# Patient Record
Sex: Female | Born: 1950
Health system: Southern US, Community
[De-identification: ages and names within clinical notes are randomized; demographics above are authoritative.]

## PROBLEM LIST (undated history)

## (undated) DIAGNOSIS — D649 Anemia, unspecified: Secondary | ICD-10-CM

## (undated) DIAGNOSIS — E119 Type 2 diabetes mellitus without complications: Secondary | ICD-10-CM

## (undated) DIAGNOSIS — C801 Malignant (primary) neoplasm, unspecified: Secondary | ICD-10-CM

## (undated) DIAGNOSIS — Z9289 Personal history of other medical treatment: Secondary | ICD-10-CM

## (undated) DIAGNOSIS — R011 Cardiac murmur, unspecified: Secondary | ICD-10-CM

## (undated) DIAGNOSIS — K8020 Calculus of gallbladder without cholecystitis without obstruction: Secondary | ICD-10-CM

## (undated) DIAGNOSIS — M109 Gout, unspecified: Secondary | ICD-10-CM

## (undated) DIAGNOSIS — I499 Cardiac arrhythmia, unspecified: Secondary | ICD-10-CM

## (undated) DIAGNOSIS — K56609 Unspecified intestinal obstruction, unspecified as to partial versus complete obstruction: Secondary | ICD-10-CM

## (undated) DIAGNOSIS — I10 Essential (primary) hypertension: Secondary | ICD-10-CM

## (undated) DIAGNOSIS — N2889 Other specified disorders of kidney and ureter: Secondary | ICD-10-CM

## (undated) DIAGNOSIS — G5600 Carpal tunnel syndrome, unspecified upper limb: Secondary | ICD-10-CM

## (undated) DIAGNOSIS — N183 Chronic kidney disease, stage 3 unspecified: Secondary | ICD-10-CM

## (undated) DIAGNOSIS — Z8639 Personal history of other endocrine, nutritional and metabolic disease: Secondary | ICD-10-CM

## (undated) DIAGNOSIS — J690 Pneumonitis due to inhalation of food and vomit: Secondary | ICD-10-CM

## (undated) DIAGNOSIS — C541 Malignant neoplasm of endometrium: Secondary | ICD-10-CM

## (undated) DIAGNOSIS — K5792 Diverticulitis of intestine, part unspecified, without perforation or abscess without bleeding: Secondary | ICD-10-CM

## (undated) DIAGNOSIS — Z860100 Personal history of colon polyps, unspecified: Secondary | ICD-10-CM

## (undated) DIAGNOSIS — E785 Hyperlipidemia, unspecified: Secondary | ICD-10-CM

## (undated) DIAGNOSIS — C4490 Unspecified malignant neoplasm of skin, unspecified: Secondary | ICD-10-CM

## (undated) DIAGNOSIS — I4891 Unspecified atrial fibrillation: Secondary | ICD-10-CM

## (undated) DIAGNOSIS — R600 Localized edema: Secondary | ICD-10-CM

## (undated) DIAGNOSIS — N289 Disorder of kidney and ureter, unspecified: Secondary | ICD-10-CM

## (undated) DIAGNOSIS — M159 Polyosteoarthritis, unspecified: Secondary | ICD-10-CM

## (undated) DIAGNOSIS — H269 Unspecified cataract: Secondary | ICD-10-CM

## (undated) DIAGNOSIS — R32 Unspecified urinary incontinence: Secondary | ICD-10-CM

## (undated) DIAGNOSIS — I509 Heart failure, unspecified: Secondary | ICD-10-CM

## (undated) DIAGNOSIS — K219 Gastro-esophageal reflux disease without esophagitis: Secondary | ICD-10-CM

## (undated) DIAGNOSIS — K5790 Diverticulosis of intestine, part unspecified, without perforation or abscess without bleeding: Secondary | ICD-10-CM

## (undated) DIAGNOSIS — Z87440 Personal history of urinary (tract) infections: Secondary | ICD-10-CM

## (undated) DIAGNOSIS — T7840XA Allergy, unspecified, initial encounter: Secondary | ICD-10-CM

## (undated) DIAGNOSIS — R609 Edema, unspecified: Secondary | ICD-10-CM

## (undated) DIAGNOSIS — M1990 Unspecified osteoarthritis, unspecified site: Secondary | ICD-10-CM

## (undated) HISTORY — DX: Unspecified intestinal obstruction, unspecified as to partial versus complete obstruction: K56.609

## (undated) HISTORY — DX: Polyosteoarthritis, unspecified: M15.9

## (undated) HISTORY — DX: Pneumonitis due to inhalation of food and vomit: J69.0

## (undated) HISTORY — DX: Calculus of gallbladder without cholecystitis without obstruction: K80.20

## (undated) HISTORY — PX: COLOSTOMY REVERSAL: SHX5782

## (undated) HISTORY — PX: ABDOMINAL SURGERY: SHX537

## (undated) HISTORY — DX: Heart failure, unspecified: I50.9

## (undated) HISTORY — DX: Cardiac murmur, unspecified: R01.1

## (undated) HISTORY — DX: Unspecified urinary incontinence: R32

## (undated) HISTORY — PX: COLOSTOMY: SHX63

## (undated) HISTORY — PX: PILONIDAL CYST EXCISION: SHX744

## (undated) HISTORY — PX: EYE SURGERY: SHX253

## (undated) HISTORY — DX: Other specified disorders of kidney and ureter: N28.89

## (undated) HISTORY — PX: COLONOSCOPY: SHX174

## (undated) HISTORY — DX: Localized edema: R60.0

## (undated) HISTORY — DX: Diverticulosis of intestine, part unspecified, without perforation or abscess without bleeding: K57.90

## (undated) HISTORY — PX: CARPAL TUNNEL RELEASE: SHX101

## (undated) HISTORY — DX: Personal history of colonic polyps: Z86.010

## (undated) HISTORY — DX: Edema, unspecified: R60.9

## (undated) HISTORY — DX: Unspecified atrial fibrillation: I48.91

## (undated) HISTORY — DX: Chronic kidney disease, stage 3 unspecified: N18.30

## (undated) HISTORY — PX: SKIN CANCER EXCISION: SHX779

## (undated) HISTORY — DX: Anemia, unspecified: D64.9

## (undated) HISTORY — DX: Personal history of colon polyps, unspecified: Z86.0100

## (undated) HISTORY — PX: INJECTION KNEE: SHX2446

## (undated) HISTORY — DX: Personal history of other medical treatment: Z92.89

## (undated) HISTORY — PX: TRANSTHORACIC ECHOCARDIOGRAM: SHX275

## (undated) HISTORY — PX: COLON SURGERY: SHX602

## (undated) HISTORY — DX: Personal history of urinary (tract) infections: Z87.440

## (undated) HISTORY — DX: Cardiac arrhythmia, unspecified: I49.9

## (undated) HISTORY — DX: Unspecified cataract: H26.9

## (undated) HISTORY — DX: Disorder of kidney and ureter, unspecified: N28.9

## (undated) HISTORY — DX: Allergy, unspecified, initial encounter: T78.40XA

---

## 1989-12-31 HISTORY — PX: CARPAL TUNNEL RELEASE: SHX101

## 1998-12-31 HISTORY — PX: OOPHORECTOMY: SHX86

## 2014-03-09 DIAGNOSIS — Z124 Encounter for screening for malignant neoplasm of cervix: Secondary | ICD-10-CM | POA: Diagnosis not present

## 2014-03-10 DIAGNOSIS — Z124 Encounter for screening for malignant neoplasm of cervix: Secondary | ICD-10-CM | POA: Diagnosis not present

## 2014-04-23 DIAGNOSIS — I10 Essential (primary) hypertension: Secondary | ICD-10-CM | POA: Diagnosis not present

## 2014-04-23 DIAGNOSIS — E785 Hyperlipidemia, unspecified: Secondary | ICD-10-CM | POA: Diagnosis not present

## 2014-04-23 DIAGNOSIS — E119 Type 2 diabetes mellitus without complications: Secondary | ICD-10-CM | POA: Diagnosis not present

## 2014-04-23 DIAGNOSIS — E669 Obesity, unspecified: Secondary | ICD-10-CM | POA: Diagnosis not present

## 2014-04-23 DIAGNOSIS — Z6841 Body Mass Index (BMI) 40.0 and over, adult: Secondary | ICD-10-CM | POA: Diagnosis not present

## 2014-05-31 DIAGNOSIS — N959 Unspecified menopausal and perimenopausal disorder: Secondary | ICD-10-CM | POA: Diagnosis not present

## 2014-05-31 DIAGNOSIS — Z1231 Encounter for screening mammogram for malignant neoplasm of breast: Secondary | ICD-10-CM | POA: Diagnosis not present

## 2014-06-21 DIAGNOSIS — I10 Essential (primary) hypertension: Secondary | ICD-10-CM | POA: Diagnosis not present

## 2014-06-21 DIAGNOSIS — Z23 Encounter for immunization: Secondary | ICD-10-CM | POA: Diagnosis not present

## 2014-06-21 DIAGNOSIS — M25519 Pain in unspecified shoulder: Secondary | ICD-10-CM | POA: Diagnosis not present

## 2014-06-21 DIAGNOSIS — E785 Hyperlipidemia, unspecified: Secondary | ICD-10-CM | POA: Diagnosis not present

## 2014-06-21 DIAGNOSIS — M109 Gout, unspecified: Secondary | ICD-10-CM | POA: Diagnosis not present

## 2014-06-21 DIAGNOSIS — E119 Type 2 diabetes mellitus without complications: Secondary | ICD-10-CM | POA: Diagnosis not present

## 2014-06-21 DIAGNOSIS — M6530 Trigger finger, unspecified finger: Secondary | ICD-10-CM | POA: Diagnosis not present

## 2014-07-23 DIAGNOSIS — I10 Essential (primary) hypertension: Secondary | ICD-10-CM | POA: Diagnosis not present

## 2014-07-23 DIAGNOSIS — E785 Hyperlipidemia, unspecified: Secondary | ICD-10-CM | POA: Diagnosis not present

## 2014-07-23 DIAGNOSIS — Z6841 Body Mass Index (BMI) 40.0 and over, adult: Secondary | ICD-10-CM | POA: Diagnosis not present

## 2014-07-23 DIAGNOSIS — E119 Type 2 diabetes mellitus without complications: Secondary | ICD-10-CM | POA: Diagnosis not present

## 2014-07-23 DIAGNOSIS — E669 Obesity, unspecified: Secondary | ICD-10-CM | POA: Diagnosis not present

## 2014-08-11 DIAGNOSIS — L259 Unspecified contact dermatitis, unspecified cause: Secondary | ICD-10-CM | POA: Diagnosis not present

## 2014-08-11 DIAGNOSIS — L819 Disorder of pigmentation, unspecified: Secondary | ICD-10-CM | POA: Diagnosis not present

## 2014-08-11 DIAGNOSIS — D1801 Hemangioma of skin and subcutaneous tissue: Secondary | ICD-10-CM | POA: Diagnosis not present

## 2014-08-11 DIAGNOSIS — L821 Other seborrheic keratosis: Secondary | ICD-10-CM | POA: Diagnosis not present

## 2014-08-11 DIAGNOSIS — D239 Other benign neoplasm of skin, unspecified: Secondary | ICD-10-CM | POA: Diagnosis not present

## 2014-08-11 DIAGNOSIS — L905 Scar conditions and fibrosis of skin: Secondary | ICD-10-CM | POA: Diagnosis not present

## 2014-08-11 DIAGNOSIS — L908 Other atrophic disorders of skin: Secondary | ICD-10-CM | POA: Diagnosis not present

## 2014-08-11 DIAGNOSIS — L918 Other hypertrophic disorders of the skin: Secondary | ICD-10-CM | POA: Diagnosis not present

## 2014-10-20 DIAGNOSIS — M109 Gout, unspecified: Secondary | ICD-10-CM | POA: Diagnosis not present

## 2014-10-20 DIAGNOSIS — E119 Type 2 diabetes mellitus without complications: Secondary | ICD-10-CM | POA: Diagnosis not present

## 2014-10-20 DIAGNOSIS — Z23 Encounter for immunization: Secondary | ICD-10-CM | POA: Diagnosis not present

## 2014-10-20 DIAGNOSIS — I10 Essential (primary) hypertension: Secondary | ICD-10-CM | POA: Diagnosis not present

## 2014-10-20 DIAGNOSIS — M65331 Trigger finger, right middle finger: Secondary | ICD-10-CM | POA: Diagnosis not present

## 2014-10-20 DIAGNOSIS — E785 Hyperlipidemia, unspecified: Secondary | ICD-10-CM | POA: Diagnosis not present

## 2014-11-11 DIAGNOSIS — E119 Type 2 diabetes mellitus without complications: Secondary | ICD-10-CM | POA: Diagnosis not present

## 2014-12-02 DIAGNOSIS — N858 Other specified noninflammatory disorders of uterus: Secondary | ICD-10-CM | POA: Diagnosis not present

## 2014-12-02 DIAGNOSIS — N950 Postmenopausal bleeding: Secondary | ICD-10-CM | POA: Diagnosis not present

## 2015-01-12 DIAGNOSIS — N950 Postmenopausal bleeding: Secondary | ICD-10-CM | POA: Diagnosis not present

## 2015-01-13 DIAGNOSIS — N840 Polyp of corpus uteri: Secondary | ICD-10-CM | POA: Diagnosis not present

## 2015-01-13 DIAGNOSIS — N950 Postmenopausal bleeding: Secondary | ICD-10-CM | POA: Diagnosis not present

## 2015-01-25 DIAGNOSIS — Z09 Encounter for follow-up examination after completed treatment for conditions other than malignant neoplasm: Secondary | ICD-10-CM | POA: Diagnosis not present

## 2015-01-26 DIAGNOSIS — E119 Type 2 diabetes mellitus without complications: Secondary | ICD-10-CM | POA: Diagnosis not present

## 2015-04-27 DIAGNOSIS — E785 Hyperlipidemia, unspecified: Secondary | ICD-10-CM | POA: Diagnosis not present

## 2015-04-27 DIAGNOSIS — I10 Essential (primary) hypertension: Secondary | ICD-10-CM | POA: Diagnosis not present

## 2015-04-27 DIAGNOSIS — M25561 Pain in right knee: Secondary | ICD-10-CM | POA: Diagnosis not present

## 2015-04-27 DIAGNOSIS — E119 Type 2 diabetes mellitus without complications: Secondary | ICD-10-CM | POA: Diagnosis not present

## 2015-06-02 DIAGNOSIS — Z6841 Body Mass Index (BMI) 40.0 and over, adult: Secondary | ICD-10-CM | POA: Diagnosis not present

## 2015-06-02 DIAGNOSIS — Z1231 Encounter for screening mammogram for malignant neoplasm of breast: Secondary | ICD-10-CM | POA: Diagnosis not present

## 2015-06-02 DIAGNOSIS — Z01419 Encounter for gynecological examination (general) (routine) without abnormal findings: Secondary | ICD-10-CM | POA: Diagnosis not present

## 2015-06-02 DIAGNOSIS — R42 Dizziness and giddiness: Secondary | ICD-10-CM | POA: Diagnosis not present

## 2015-07-27 DIAGNOSIS — Z1211 Encounter for screening for malignant neoplasm of colon: Secondary | ICD-10-CM | POA: Diagnosis not present

## 2015-07-27 DIAGNOSIS — E119 Type 2 diabetes mellitus without complications: Secondary | ICD-10-CM | POA: Diagnosis not present

## 2015-07-27 DIAGNOSIS — Z794 Long term (current) use of insulin: Secondary | ICD-10-CM | POA: Diagnosis not present

## 2015-07-27 DIAGNOSIS — Z23 Encounter for immunization: Secondary | ICD-10-CM | POA: Diagnosis not present

## 2015-08-15 DIAGNOSIS — D225 Melanocytic nevi of trunk: Secondary | ICD-10-CM | POA: Diagnosis not present

## 2015-08-15 DIAGNOSIS — D2261 Melanocytic nevi of right upper limb, including shoulder: Secondary | ICD-10-CM | POA: Diagnosis not present

## 2015-08-15 DIAGNOSIS — L821 Other seborrheic keratosis: Secondary | ICD-10-CM | POA: Diagnosis not present

## 2015-08-15 DIAGNOSIS — D2272 Melanocytic nevi of left lower limb, including hip: Secondary | ICD-10-CM | POA: Diagnosis not present

## 2015-08-15 DIAGNOSIS — D1801 Hemangioma of skin and subcutaneous tissue: Secondary | ICD-10-CM | POA: Diagnosis not present

## 2015-08-15 DIAGNOSIS — D2271 Melanocytic nevi of right lower limb, including hip: Secondary | ICD-10-CM | POA: Diagnosis not present

## 2015-08-15 DIAGNOSIS — D2262 Melanocytic nevi of left upper limb, including shoulder: Secondary | ICD-10-CM | POA: Diagnosis not present

## 2015-08-15 DIAGNOSIS — D224 Melanocytic nevi of scalp and neck: Secondary | ICD-10-CM | POA: Diagnosis not present

## 2015-08-15 DIAGNOSIS — D2239 Melanocytic nevi of other parts of face: Secondary | ICD-10-CM | POA: Diagnosis not present

## 2015-09-09 DIAGNOSIS — Z1211 Encounter for screening for malignant neoplasm of colon: Secondary | ICD-10-CM | POA: Diagnosis not present

## 2015-09-09 DIAGNOSIS — R109 Unspecified abdominal pain: Secondary | ICD-10-CM | POA: Diagnosis not present

## 2015-09-09 DIAGNOSIS — D649 Anemia, unspecified: Secondary | ICD-10-CM | POA: Diagnosis not present

## 2015-10-28 DIAGNOSIS — K2970 Gastritis, unspecified, without bleeding: Secondary | ICD-10-CM | POA: Diagnosis not present

## 2015-10-28 DIAGNOSIS — R1084 Generalized abdominal pain: Secondary | ICD-10-CM | POA: Diagnosis not present

## 2015-10-28 DIAGNOSIS — K449 Diaphragmatic hernia without obstruction or gangrene: Secondary | ICD-10-CM | POA: Diagnosis not present

## 2015-10-28 DIAGNOSIS — Z1211 Encounter for screening for malignant neoplasm of colon: Secondary | ICD-10-CM | POA: Diagnosis not present

## 2015-10-28 DIAGNOSIS — K644 Residual hemorrhoidal skin tags: Secondary | ICD-10-CM | POA: Diagnosis not present

## 2015-10-28 DIAGNOSIS — D122 Benign neoplasm of ascending colon: Secondary | ICD-10-CM | POA: Diagnosis not present

## 2015-10-28 DIAGNOSIS — D125 Benign neoplasm of sigmoid colon: Secondary | ICD-10-CM | POA: Diagnosis not present

## 2015-10-28 DIAGNOSIS — D126 Benign neoplasm of colon, unspecified: Secondary | ICD-10-CM | POA: Diagnosis not present

## 2015-11-14 DIAGNOSIS — E785 Hyperlipidemia, unspecified: Secondary | ICD-10-CM | POA: Diagnosis not present

## 2015-11-14 DIAGNOSIS — I10 Essential (primary) hypertension: Secondary | ICD-10-CM | POA: Diagnosis not present

## 2015-11-14 DIAGNOSIS — Z23 Encounter for immunization: Secondary | ICD-10-CM | POA: Diagnosis not present

## 2015-11-14 DIAGNOSIS — N3941 Urge incontinence: Secondary | ICD-10-CM | POA: Diagnosis not present

## 2015-11-14 DIAGNOSIS — E119 Type 2 diabetes mellitus without complications: Secondary | ICD-10-CM | POA: Diagnosis not present

## 2015-11-14 DIAGNOSIS — M109 Gout, unspecified: Secondary | ICD-10-CM | POA: Diagnosis not present

## 2015-11-16 DIAGNOSIS — E119 Type 2 diabetes mellitus without complications: Secondary | ICD-10-CM | POA: Diagnosis not present

## 2015-11-16 DIAGNOSIS — H2513 Age-related nuclear cataract, bilateral: Secondary | ICD-10-CM | POA: Diagnosis not present

## 2015-12-20 DIAGNOSIS — R748 Abnormal levels of other serum enzymes: Secondary | ICD-10-CM | POA: Diagnosis not present

## 2015-12-20 DIAGNOSIS — Z794 Long term (current) use of insulin: Secondary | ICD-10-CM | POA: Diagnosis not present

## 2015-12-20 DIAGNOSIS — E119 Type 2 diabetes mellitus without complications: Secondary | ICD-10-CM | POA: Diagnosis not present

## 2016-01-13 DIAGNOSIS — R748 Abnormal levels of other serum enzymes: Secondary | ICD-10-CM | POA: Diagnosis not present

## 2016-06-04 DIAGNOSIS — Z1231 Encounter for screening mammogram for malignant neoplasm of breast: Secondary | ICD-10-CM | POA: Diagnosis not present

## 2016-06-04 DIAGNOSIS — Z6841 Body Mass Index (BMI) 40.0 and over, adult: Secondary | ICD-10-CM | POA: Diagnosis not present

## 2016-06-04 DIAGNOSIS — N958 Other specified menopausal and perimenopausal disorders: Secondary | ICD-10-CM | POA: Diagnosis not present

## 2016-06-04 DIAGNOSIS — Z124 Encounter for screening for malignant neoplasm of cervix: Secondary | ICD-10-CM | POA: Diagnosis not present

## 2016-06-07 ENCOUNTER — Inpatient Hospital Stay (HOSPITAL_COMMUNITY)
Admission: EM | Admit: 2016-06-07 | Discharge: 2016-06-08 | DRG: 388 | Disposition: A | Discharge status: Home / Self Care | Payer: Medicare Other | Attending: Internal Medicine | Admitting: Internal Medicine

## 2016-06-07 ENCOUNTER — Encounter (HOSPITAL_COMMUNITY): Payer: Self-pay | Admitting: *Deleted

## 2016-06-07 ENCOUNTER — Emergency Department (HOSPITAL_COMMUNITY): Payer: Medicare Other

## 2016-06-07 DIAGNOSIS — M109 Gout, unspecified: Secondary | ICD-10-CM | POA: Diagnosis present

## 2016-06-07 DIAGNOSIS — Z85828 Personal history of other malignant neoplasm of skin: Secondary | ICD-10-CM | POA: Diagnosis not present

## 2016-06-07 DIAGNOSIS — Z6841 Body Mass Index (BMI) 40.0 and over, adult: Secondary | ICD-10-CM | POA: Diagnosis not present

## 2016-06-07 DIAGNOSIS — Z79899 Other long term (current) drug therapy: Secondary | ICD-10-CM

## 2016-06-07 DIAGNOSIS — Z9049 Acquired absence of other specified parts of digestive tract: Secondary | ICD-10-CM | POA: Diagnosis not present

## 2016-06-07 DIAGNOSIS — K5669 Other intestinal obstruction: Secondary | ICD-10-CM

## 2016-06-07 DIAGNOSIS — E119 Type 2 diabetes mellitus without complications: Secondary | ICD-10-CM | POA: Diagnosis present

## 2016-06-07 DIAGNOSIS — I10 Essential (primary) hypertension: Secondary | ICD-10-CM

## 2016-06-07 DIAGNOSIS — R109 Unspecified abdominal pain: Secondary | ICD-10-CM | POA: Diagnosis not present

## 2016-06-07 DIAGNOSIS — R918 Other nonspecific abnormal finding of lung field: Secondary | ICD-10-CM

## 2016-06-07 DIAGNOSIS — Z7982 Long term (current) use of aspirin: Secondary | ICD-10-CM | POA: Diagnosis not present

## 2016-06-07 DIAGNOSIS — K8020 Calculus of gallbladder without cholecystitis without obstruction: Secondary | ICD-10-CM | POA: Diagnosis not present

## 2016-06-07 DIAGNOSIS — E785 Hyperlipidemia, unspecified: Secondary | ICD-10-CM

## 2016-06-07 DIAGNOSIS — J189 Pneumonia, unspecified organism: Secondary | ICD-10-CM | POA: Diagnosis present

## 2016-06-07 DIAGNOSIS — M1990 Unspecified osteoarthritis, unspecified site: Secondary | ICD-10-CM | POA: Diagnosis present

## 2016-06-07 DIAGNOSIS — Z87891 Personal history of nicotine dependence: Secondary | ICD-10-CM

## 2016-06-07 DIAGNOSIS — K760 Fatty (change of) liver, not elsewhere classified: Secondary | ICD-10-CM | POA: Diagnosis present

## 2016-06-07 DIAGNOSIS — K5660 Unspecified intestinal obstruction: Principal | ICD-10-CM | POA: Diagnosis present

## 2016-06-07 DIAGNOSIS — E118 Type 2 diabetes mellitus with unspecified complications: Secondary | ICD-10-CM

## 2016-06-07 DIAGNOSIS — R1084 Generalized abdominal pain: Secondary | ICD-10-CM | POA: Diagnosis not present

## 2016-06-07 DIAGNOSIS — K56609 Unspecified intestinal obstruction, unspecified as to partial versus complete obstruction: Secondary | ICD-10-CM | POA: Diagnosis present

## 2016-06-07 DIAGNOSIS — Z794 Long term (current) use of insulin: Secondary | ICD-10-CM

## 2016-06-07 DIAGNOSIS — E860 Dehydration: Secondary | ICD-10-CM | POA: Diagnosis present

## 2016-06-07 HISTORY — DX: Malignant (primary) neoplasm, unspecified: C80.1

## 2016-06-07 HISTORY — DX: Type 2 diabetes mellitus without complications: E11.9

## 2016-06-07 HISTORY — DX: Diverticulitis of intestine, part unspecified, without perforation or abscess without bleeding: K57.92

## 2016-06-07 HISTORY — DX: Essential (primary) hypertension: I10

## 2016-06-07 HISTORY — DX: Unspecified osteoarthritis, unspecified site: M19.90

## 2016-06-07 LAB — URINALYSIS, ROUTINE W REFLEX MICROSCOPIC
Glucose, UA: NEGATIVE mg/dL
Hgb urine dipstick: NEGATIVE
KETONES UR: 15 mg/dL — AB
LEUKOCYTES UA: NEGATIVE
NITRITE: NEGATIVE
PROTEIN: NEGATIVE mg/dL
Specific Gravity, Urine: 1.02 (ref 1.005–1.030)
pH: 5.5 (ref 5.0–8.0)

## 2016-06-07 LAB — CBC
HCT: 40.3 % (ref 36.0–46.0)
Hemoglobin: 13.2 g/dL (ref 12.0–15.0)
MCH: 29.6 pg (ref 26.0–34.0)
MCHC: 32.8 g/dL (ref 30.0–36.0)
MCV: 90.4 fL (ref 78.0–100.0)
PLATELETS: 182 10*3/uL (ref 150–400)
RBC: 4.46 MIL/uL (ref 3.87–5.11)
RDW: 14.6 % (ref 11.5–15.5)
WBC: 13.1 10*3/uL — AB (ref 4.0–10.5)

## 2016-06-07 LAB — COMPREHENSIVE METABOLIC PANEL
ALT: 21 U/L (ref 14–54)
AST: 24 U/L (ref 15–41)
Albumin: 4.4 g/dL (ref 3.5–5.0)
Alkaline Phosphatase: 132 U/L — ABNORMAL HIGH (ref 38–126)
Anion gap: 11 (ref 5–15)
BILIRUBIN TOTAL: 0.9 mg/dL (ref 0.3–1.2)
BUN: 19 mg/dL (ref 6–20)
CO2: 25 mmol/L (ref 22–32)
CREATININE: 1.09 mg/dL — AB (ref 0.44–1.00)
Calcium: 9.9 mg/dL (ref 8.9–10.3)
Chloride: 106 mmol/L (ref 101–111)
GFR, EST AFRICAN AMERICAN: 59 mL/min — AB (ref >60)
GFR, EST NON AFRICAN AMERICAN: 51 mL/min — AB (ref >60)
Glucose, Bld: 154 mg/dL — ABNORMAL HIGH (ref 65–99)
POTASSIUM: 3.8 mmol/L (ref 3.5–5.1)
Sodium: 142 mmol/L (ref 135–145)
TOTAL PROTEIN: 7.3 g/dL (ref 6.5–8.1)

## 2016-06-07 LAB — GLUCOSE, CAPILLARY: Glucose-Capillary: 168 mg/dL — ABNORMAL HIGH (ref 65–99)

## 2016-06-07 LAB — LIPASE, BLOOD: Lipase: 22 U/L (ref 11–51)

## 2016-06-07 MED ORDER — ENOXAPARIN SODIUM 40 MG/0.4ML ~~LOC~~ SOLN
40.0000 mg | SUBCUTANEOUS | Status: DC
  Administered 2016-06-07: 40 mg via SUBCUTANEOUS
  Filled 2016-06-07: qty 0.4

## 2016-06-07 MED ORDER — HYDRALAZINE HCL 20 MG/ML IJ SOLN: 5.0000 mg | INTRAMUSCULAR | Status: DC | PRN

## 2016-06-07 MED ORDER — INSULIN ASPART 100 UNIT/ML ~~LOC~~ SOLN: 0.0000 [IU] | Freq: Every day | SUBCUTANEOUS | Status: DC

## 2016-06-07 MED ORDER — INSULIN ASPART 100 UNIT/ML ~~LOC~~ SOLN
0.0000 [IU] | Freq: Three times a day (TID) | SUBCUTANEOUS | Status: DC
  Administered 2016-06-08: 1 [IU] via SUBCUTANEOUS

## 2016-06-07 MED ORDER — SODIUM CHLORIDE 0.45 % IV SOLN
INTRAVENOUS | Status: DC
  Administered 2016-06-07: 19:00:00 via INTRAVENOUS

## 2016-06-07 MED ORDER — MORPHINE SULFATE (PF) 4 MG/ML IV SOLN: 4.0000 mg | INTRAVENOUS | Status: DC | PRN

## 2016-06-07 MED ORDER — CARBOXYMETHYLCELLULOSE SODIUM 0.5 % OP SOLN: 1.0000 [drp] | Freq: Every day | OPHTHALMIC | Status: DC | PRN

## 2016-06-07 MED ORDER — NYSTATIN-TRIAMCINOLONE 100000-0.1 UNIT/GM-% EX OINT: 1.0000 "application " | TOPICAL_OINTMENT | Freq: Every day | CUTANEOUS | Status: DC | PRN

## 2016-06-07 MED ORDER — INSULIN DETEMIR 100 UNIT/ML ~~LOC~~ SOLN
45.0000 [IU] | Freq: Two times a day (BID) | SUBCUTANEOUS | Status: DC
  Administered 2016-06-07 – 2016-06-08 (×2): 45 [IU] via SUBCUTANEOUS
  Filled 2016-06-07 (×4): qty 0.45

## 2016-06-07 MED ORDER — DIATRIZOATE MEGLUMINE & SODIUM 66-10 % PO SOLN: 30.0000 mL | Freq: Once | ORAL | Status: DC

## 2016-06-07 MED ORDER — SODIUM CHLORIDE 0.9 % IV BOLUS (SEPSIS)
1000.0000 mL | Freq: Once | INTRAVENOUS | Status: AC
  Administered 2016-06-07: 1000 mL via INTRAVENOUS

## 2016-06-07 MED ORDER — ONDANSETRON HCL 4 MG/2ML IJ SOLN
4.0000 mg | Freq: Once | INTRAMUSCULAR | Status: AC | PRN
  Administered 2016-06-07: 4 mg via INTRAVENOUS
  Filled 2016-06-07: qty 2

## 2016-06-07 MED ORDER — DIATRIZOATE MEGLUMINE & SODIUM 66-10 % PO SOLN
ORAL | Status: AC
  Filled 2016-06-07: qty 30

## 2016-06-07 MED ORDER — ONDANSETRON HCL 4 MG/2ML IJ SOLN
4.0000 mg | Freq: Four times a day (QID) | INTRAMUSCULAR | Status: DC | PRN
  Filled 2016-06-07: qty 2

## 2016-06-07 MED ORDER — KETOROLAC TROMETHAMINE 30 MG/ML IJ SOLN
30.0000 mg | Freq: Once | INTRAMUSCULAR | Status: AC
Start: 2016-06-07 — End: 2016-06-07
  Administered 2016-06-07: 30 mg via INTRAVENOUS
  Filled 2016-06-07: qty 1

## 2016-06-07 MED ORDER — SODIUM CHLORIDE 0.9 % IV SOLN
INTRAVENOUS | Status: AC
Start: 2016-06-07 — End: 2016-06-08
  Administered 2016-06-07: 18:00:00 via INTRAVENOUS

## 2016-06-07 MED ORDER — IOPAMIDOL (ISOVUE-300) INJECTION 61%
INTRAVENOUS | Status: AC
  Administered 2016-06-07: 100 mL
  Filled 2016-06-07: qty 100

## 2016-06-07 MED ORDER — ONDANSETRON HCL 4 MG PO TABS: 4.0000 mg | ORAL_TABLET | Freq: Four times a day (QID) | ORAL | Status: DC | PRN

## 2016-06-07 NOTE — ED Provider Notes (Signed)
CSN: [id number]     Arrival date & time 06/07/16  0759 History   First MD Initiated Contact with Patient 06/07/16 [phone]     Chief Complaint  Patient presents with  . Emesis  . Heartburn     (Consider location/radiation/quality/duration/timing/severity/associated sxs/prior Treatment) HPI...Marland KitchenMarland KitchenPatient presents with generalized abdominal pain with nausea for approximately 24-36 hours. She is status post partial colectomy approximately 20 years ago for diverticulitis with associated colostomy which was ultimately reversed. One previous episode of small bowel obstruction. She has been unable to keep fluids down. Review systems positive for nausea. No vomiting or diarrhea. No fever, sweats, or chills. Nothing makes symptoms better or worse. Severity is moderate.  Past Medical History  Diagnosis Date  . Diabetes mellitus without complication ([place])   . Hypertension   . Arthritis   . Cancer ([place])     skin CA  . Diverticulitis    Past Surgical History  Procedure Laterality Date  . Abdominal surgery     History reviewed. No pertinent family history. Social History  Substance Use Topics  . Smoking status: Former Smoker    Quit date: 06/08/1987  . Smokeless tobacco: None  . Alcohol Use: Yes     Comment: social   OB History    No data available     Review of Systems  All other systems reviewed and are negative.     Allergies  Lisinopril and Metformin and related  Home Medications   Prior to Admission medications   Medication Sig Start Date End Date Taking? Authorizing Provider  allopurinol (ZYLOPRIM) 300 MG tablet Take 300 mg by mouth daily.   Yes Historical Provider, MD  aspirin 325 MG tablet Take 325 mg by mouth daily.   Yes Historical Provider, MD  atorvastatin (LIPITOR) 40 MG tablet Take 40 mg by mouth daily.   Yes Historical Provider, MD  carboxymethylcellulose (REFRESH PLUS) 0.5 % SOLN Apply 1 drop to eye daily as needed (dry eyes).   Yes Historical Provider, MD  Coenzyme  Q10 (CO Q 10 PO) Take 1 tablet by mouth daily.   Yes Historical Provider, MD  glucosamine-chondroitin 500-400 MG tablet Take 1 tablet by mouth 2 (two) times daily.   Yes Historical Provider, MD  ibuprofen (ADVIL,MOTRIN) 200 MG tablet Take 800 mg by mouth every 6 (six) hours as needed for mild pain.   Yes Historical Provider, MD  insulin detemir (LEVEMIR) 100 UNIT/ML injection Inject 90 Units into the skin 2 (two) times daily.   Yes Historical Provider, MD  KRILL OIL PO Take 1 capsule by mouth daily.   Yes Historical Provider, MD  Liraglutide (VICTOZA) 18 MG/3ML SOPN Inject 1.8 mg into the skin daily.   Yes Historical Provider, MD  losartan (COZAAR) 100 MG tablet Take 100 mg by mouth daily.   Yes Historical Provider, MD  Melatonin 5 MG TABS Take 10 mg by mouth at bedtime.   Yes Historical Provider, MD  Multiple Vitamins-Minerals (MULTIVITAMIN WITH MINERALS) tablet Take 1 tablet by mouth daily.   Yes Historical Provider, MD  nystatin-triamcinolone ointment (MYCOLOG) Apply 1 application topically daily as needed (itching).   Yes Historical Provider, MD   BP 160/62 mmHg  Pulse 108  Temp(Src) 100 F (37.8 C) (Oral)  Resp 18  Ht 5\' 5"  (1.651 m)  Wt 279 lb (126.554 kg)  BMI 46.43 kg/m2  SpO2 96% Physical Exam  Constitutional: She is oriented to person, place, and time.  obese  HENT:  Head: Normocephalic and atraumatic.  Eyes:  Conjunctivae and EOM are normal. Pupils are equal, round, and reactive to light.  Neck: Normal range of motion. Neck supple.  Cardiovascular: Normal rate and regular rhythm.   Pulmonary/Chest: Effort normal and breath sounds normal.  Abdominal:  Generalized abdominal tenderness with previous surgical scars.  Musculoskeletal: Normal range of motion.  Neurological: She is alert and oriented to person, place, and time.  Skin: Skin is warm and dry.  Psychiatric: She has a normal mood and affect. Her behavior is normal.  Nursing note and vitals reviewed.   ED Course   Procedures (including critical care time) Labs Review Labs Reviewed  COMPREHENSIVE METABOLIC PANEL - Abnormal; Notable for the following:    Glucose, Bld 154 (*)    Creatinine, Ser 1.09 (*)    Alkaline Phosphatase 132 (*)    GFR calc non Af Amer 51 (*)    GFR calc Af Amer 59 (*)    All other components within normal limits  CBC - Abnormal; Notable for the following:    WBC 13.1 (*)    All other components within normal limits  LIPASE, BLOOD  URINALYSIS, ROUTINE W REFLEX MICROSCOPIC (NOT AT [facility])    Imaging Review Ct Abdomen Pelvis W Contrast  06/07/2016  CLINICAL DATA:  Abdominal pain and burping starting Sunday, history of colostomy and reversal, history of small bowel obstruction. EXAM: CT ABDOMEN AND PELVIS WITH CONTRAST TECHNIQUE: Multidetector CT imaging of the abdomen and pelvis was performed using the standard protocol following bolus administration of intravenous contrast. CONTRAST:  128mL ISOVUE-300 IOPAMIDOL (ISOVUE-300) INJECTION 61% COMPARISON:  None. FINDINGS: Lower chest: There is patchy infiltrate in right middle lobe. Patchy central infiltrate is noted in left lower lobe. Pneumonia cannot be excluded. Clinical correlation is necessary. Hepatobiliary: Mild fatty infiltration of the liver. No intrahepatic biliary ductal dilatation. Small calcified gallstone within gallbladder measures 3.5 mm. Pancreas: Mild atrophic pancreas. Spleen: Spleen is unremarkable. Adrenals/Urinary Tract: No adrenal gland mass. Kidneys are symmetrical in size and enhancement. No hydronephrosis or hydroureter. Urinary bladder is under distended grossly unremarkable. Delayed renal images shows bilateral renal symmetrical excretion. Bilateral visualized proximal ureter is unremarkable. Stomach/Bowel: There is no gastric outlet obstruction. Mild dilated proximal small bowel loops with contrast material in upper abdomen and left mid abdomen. Some postsurgical changes are noted mid small bowel anterior abdomen  axial image 60. No any contrast material is noted in distal small bowel loops or within colon. There are postsurgical changes in small bowel with side-to-side anastomosis in right lower quadrant please see axial image 54. Coronal image 61 there is mild dilatation of the small bowel just above the anastomosis with some fecal like material. Distal small bowel in right lower quadrant is smaller caliber. Findings are highly suspicious for at least partial small bowel obstruction at the anastomosis site in right lower quadrant. No any contrast material noted within colon or terminal small bowel. There is a low lying cecum. Moderate stool noted within cecum and right colon. No pericecal inflammation. Surgical clips are noted within lower abdomen and pelvis. Vascular/Lymphatic: Atherosclerotic calcifications of abdominal aorta and iliac arteries are noted. No aortic aneurysm. No retroperitoneal or mesenteric adenopathy. Reproductive: No adnexal masses noted. The uterus is anteflexed no pelvic free fluid. Other: There is scarring in right lower quadrant anterior abdominal wall from previous colostomy. Some skin thickening, scarring or cellulitis noted lower anterior abdominal wall. There is scarring in right paraumbilical region anterior abdominal wall. No ascites or free abdominal air. Musculoskeletal: Sagittal images of the spine  shows degenerative changes thoracolumbar spine. No destructive bony lesions are noted within pelvis. IMPRESSION: 1. There are postsurgical changes in small bowel with side-to-side anastomosis in right lower quadrant please see axial image 54. Coronal image 61 there is mild dilatation of the small bowel just above the anastomosis with some fecal like material. Distal small bowel in right lower quadrant is smaller caliber. Findings are highly suspicious for at least partial small bowel obstruction at the anastomosis site in right lower quadrant. No any contrast material is noted within distal small  bowel or within colon. 2. There is a low lying cecum. Some colonic stool noted within cecum. No pericecal inflammation. 3. No hydronephrosis or hydroureter. 4. Mild fatty infiltration of the liver. 5. Small calcified gallstone within gallbladder measures 3.5 mm. 6. There is patchy central lung infiltrate in right middle lobe. Patchy central infiltrate noted in left lower lobe. Multifocal pneumonia cannot be excluded. Clinical correlation is necessary. Electronically Signed   By: Lahoma Crocker M.D.   On: 06/07/2016 11:30   I have personally reviewed and evaluated these images and lab results as part of my medical decision-making.   EKG Interpretation None      MDM   Final diagnoses:  SBO (small bowel obstruction) ([place])    Patient is hemodynamically stable. CT scan suspicious for a small bowel obstruction. There is also commentary about an infiltrate in the right middle lobe and left lower lobe. However, patient has no symptoms of pneumonia.  Discussed with general surgery who will consult. Admit to general medicine.    Nat Christen, MD 06/07/16 1530

## 2016-06-07 NOTE — ED Notes (Signed)
Pt reports vomiting last night  Times 2  And sever heart burn. Pt denies any ABD pain at this time. Pt does reports a Hx of bowel blockage.

## 2016-06-07 NOTE — H&P (Signed)
History and Physical    [name] [id number] DOB: 03-Apr-1950 DOA: 06/07/2016  PCP: No primary care provider on file. Patient coming from: Home  Chief Complaint: Abd pain  HPI: [name] is a 66 y.o. female with medical history significant of diabetes, HTN, diverticulitis, SBO, history of abdominal surgery presenting for treatment for abdominal pain. Patient states that she gets SBO  type attacks several times per year typically she is able to manage these all without the hospital however this episode was much more severe than normal. When the patient developed abdominal pain that was fairly diffuse. Gradual onset. This is associated with nausea and vomiting and a great deal of belching. Patient states that she ate some food yesterday which made her symptoms significantly worse. Pain is constant with waxing and waning nature. Denies fevers, bowel movement though she is still passing flatus, dysuria, frequency, shortness of breath, palpitations, cough, chest pain.    ED Course: Multiple rounds of IV fluids administered as patient appears dehydrated. Zofran administered. Imaging shows SBO. Surgery consult.  Review of Systems: As per HPI otherwise 10 point review of systems negative.   Ambulatory Status: No restrictions  Past Medical History  Diagnosis Date  . Diabetes mellitus without complication ([place])   . Hypertension   . Arthritis   . Cancer ([place])     skin CA  . Diverticulitis     Past Surgical History  Procedure Laterality Date  . Abdominal surgery      Social History   Social History  . Marital Status: Married    Spouse Name: N/A  . Number of Children: N/A  . Years of Education: N/A   Occupational History  . Not on file.   Social History Main Topics  . Smoking status: Former Smoker    Quit date: 06/08/1987  . Smokeless tobacco: Not on file  . Alcohol Use: Yes     Comment: social  . Drug Use: No  . Sexual Activity: Not on file   Other Topics Concern  .  Not on file   Social History Narrative  . No narrative on file    Allergies  Allergen Reactions  . Lisinopril Cough  . Metformin And Related Other (See Comments)    Family History  Problem Relation Age of Onset  . Diabetes Sister   . Skin cancer Father     Prior to Admission medications   Medication Sig Start Date End Date Taking? Authorizing Provider  allopurinol (ZYLOPRIM) 300 MG tablet Take 300 mg by mouth daily.   Yes Historical Provider, MD  aspirin 325 MG tablet Take 325 mg by mouth daily.   Yes Historical Provider, MD  atorvastatin (LIPITOR) 40 MG tablet Take 40 mg by mouth daily.   Yes Historical Provider, MD  carboxymethylcellulose (REFRESH PLUS) 0.5 % SOLN Apply 1 drop to eye daily as needed (dry eyes).   Yes Historical Provider, MD  Coenzyme Q10 (CO Q 10 PO) Take 1 tablet by mouth daily.   Yes Historical Provider, MD  glucosamine-chondroitin 500-400 MG tablet Take 1 tablet by mouth 2 (two) times daily.   Yes Historical Provider, MD  ibuprofen (ADVIL,MOTRIN) 200 MG tablet Take 800 mg by mouth every 6 (six) hours as needed for mild pain.   Yes Historical Provider, MD  insulin detemir (LEVEMIR) 100 UNIT/ML injection Inject 90 Units into the skin 2 (two) times daily.   Yes Historical Provider, MD  KRILL OIL PO Take 1 capsule by mouth daily.   Yes Historical  Provider, MD  Liraglutide (VICTOZA) 18 MG/3ML SOPN Inject 1.8 mg into the skin daily.   Yes Historical Provider, MD  losartan (COZAAR) 100 MG tablet Take 100 mg by mouth daily.   Yes Historical Provider, MD  Melatonin 5 MG TABS Take 10 mg by mouth at bedtime.   Yes Historical Provider, MD  Multiple Vitamins-Minerals (MULTIVITAMIN WITH MINERALS) tablet Take 1 tablet by mouth daily.   Yes Historical Provider, MD  nystatin-triamcinolone ointment (MYCOLOG) Apply 1 application topically daily as needed (itching).   Yes Historical Provider, MD    Physical Exam: Filed Vitals:   06/07/16 1445 06/07/16 1500 06/07/16 1545  06/07/16 1753  BP: 138/61 160/62 152/62 158/67  Pulse: 105 108 100 106  Temp:    99.7 F (37.6 C)  TempSrc:    Oral  Resp: 18   18  Height:    5\' 5"  (1.651 m)  Weight:    124.694 kg (274 lb 14.4 oz)  SpO2: 96% 96% 95% 93%      General:  Appears calm and comfortable Eyes: PERRL, EOMI, normal lids, iris ENT:  grossly normal hearing, lips & tongue, mmm Neck:  no LAD, masses or thyromegaly Cardiovascular:  RRR, no m/r/g. No LE edema.  Respiratory:  CTA bilaterally, no w/r/r. Normal respiratory effort. Abdomen:  Soft, no significant distention, hypoactive bowel sounds mild tenderness to palpation Skin:  no rash or induration seen on limited exam Musculoskeletal:  grossly normal tone BUE/BLE, good ROM, no bony abnormality Psychiatric:  grossly normal mood and affect, speech fluent and appropriate, AOx3 Neurologic:  CN 2-12 grossly intact, moves all extremities in coordinated fashion, sensation intact  Labs on Admission: I have personally reviewed following labs and imaging studies  CBC:  Recent Labs Lab 06/07/16 0826  WBC 13.1*  HGB 13.2  HCT 40.3  MCV 90.4  PLT [id number]   Basic Metabolic Panel:  Recent Labs Lab 06/07/16 0826  NA 142  K 3.8  CL 106  CO2 25  GLUCOSE 154*  BUN 19  CREATININE 1.09*  CALCIUM 9.9   GFR: Estimated Creatinine Clearance: 66.5 mL/min (by C-G formula based on Cr of 1.09). Liver Function Tests:  Recent Labs Lab 06/07/16 0826  AST 24  ALT 21  ALKPHOS 132*  BILITOT 0.9  PROT 7.3  ALBUMIN 4.4    Recent Labs Lab 06/07/16 0826  LIPASE 22   No results for input(s): AMMONIA in the last 168 hours. Coagulation Profile: No results for input(s): INR, PROTIME in the last 168 hours. Cardiac Enzymes: No results for input(s): CKTOTAL, CKMB, CKMBINDEX, TROPONINI in the last 168 hours. BNP (last 3 results) No results for input(s): PROBNP in the last 8760 hours. HbA1C: No results for input(s): HGBA1C in the last 72 hours. CBG: No results for  input(s): GLUCAP in the last 168 hours. Lipid Profile: No results for input(s): CHOL, HDL, LDLCALC, TRIG, CHOLHDL, LDLDIRECT in the last 72 hours. Thyroid Function Tests: No results for input(s): TSH, T4TOTAL, FREET4, T3FREE, THYROIDAB in the last 72 hours. Anemia Panel: No results for input(s): VITAMINB12, FOLATE, FERRITIN, TIBC, IRON, RETICCTPCT in the last 72 hours. Urine analysis:    Component Value Date/Time   COLORURINE YELLOW 06/07/2016 1624   APPEARANCEUR CLEAR 06/07/2016 1624   LABSPEC 1.020 06/07/2016 1624   PHURINE 5.5 06/07/2016 1624   GLUCOSEU NEGATIVE 06/07/2016 1624   HGBUR NEGATIVE 06/07/2016 1624   BILIRUBINUR SMALL* 06/07/2016 1624   KETONESUR 15* 06/07/2016 1624   PROTEINUR NEGATIVE 06/07/2016 1624   NITRITE  NEGATIVE 06/07/2016 1624   LEUKOCYTESUR NEGATIVE 06/07/2016 1624    Creatinine Clearance: Estimated Creatinine Clearance: 66.5 mL/min (by C-G formula based on Cr of 1.09).  Sepsis Labs: @LABRCNTIP (procalcitonin:4,lacticidven:4) )No results found for this or any previous visit (from the past 240 hour(s)).   Radiological Exams on Admission: Ct Abdomen Pelvis W Contrast  06/07/2016  CLINICAL DATA:  Abdominal pain and burping starting Sunday, history of colostomy and reversal, history of small bowel obstruction. EXAM: CT ABDOMEN AND PELVIS WITH CONTRAST TECHNIQUE: Multidetector CT imaging of the abdomen and pelvis was performed using the standard protocol following bolus administration of intravenous contrast. CONTRAST:  167mL ISOVUE-300 IOPAMIDOL (ISOVUE-300) INJECTION 61% COMPARISON:  None. FINDINGS: Lower chest: There is patchy infiltrate in right middle lobe. Patchy central infiltrate is noted in left lower lobe. Pneumonia cannot be excluded. Clinical correlation is necessary. Hepatobiliary: Mild fatty infiltration of the liver. No intrahepatic biliary ductal dilatation. Small calcified gallstone within gallbladder measures 3.5 mm. Pancreas: Mild atrophic  pancreas. Spleen: Spleen is unremarkable. Adrenals/Urinary Tract: No adrenal gland mass. Kidneys are symmetrical in size and enhancement. No hydronephrosis or hydroureter. Urinary bladder is under distended grossly unremarkable. Delayed renal images shows bilateral renal symmetrical excretion. Bilateral visualized proximal ureter is unremarkable. Stomach/Bowel: There is no gastric outlet obstruction. Mild dilated proximal small bowel loops with contrast material in upper abdomen and left mid abdomen. Some postsurgical changes are noted mid small bowel anterior abdomen axial image 60. No any contrast material is noted in distal small bowel loops or within colon. There are postsurgical changes in small bowel with side-to-side anastomosis in right lower quadrant please see axial image 54. Coronal image 61 there is mild dilatation of the small bowel just above the anastomosis with some fecal like material. Distal small bowel in right lower quadrant is smaller caliber. Findings are highly suspicious for at least partial small bowel obstruction at the anastomosis site in right lower quadrant. No any contrast material noted within colon or terminal small bowel. There is a low lying cecum. Moderate stool noted within cecum and right colon. No pericecal inflammation. Surgical clips are noted within lower abdomen and pelvis. Vascular/Lymphatic: Atherosclerotic calcifications of abdominal aorta and iliac arteries are noted. No aortic aneurysm. No retroperitoneal or mesenteric adenopathy. Reproductive: No adnexal masses noted. The uterus is anteflexed no pelvic free fluid. Other: There is scarring in right lower quadrant anterior abdominal wall from previous colostomy. Some skin thickening, scarring or cellulitis noted lower anterior abdominal wall. There is scarring in right paraumbilical region anterior abdominal wall. No ascites or free abdominal air. Musculoskeletal: Sagittal images of the spine shows degenerative changes  thoracolumbar spine. No destructive bony lesions are noted within pelvis. IMPRESSION: 1. There are postsurgical changes in small bowel with side-to-side anastomosis in right lower quadrant please see axial image 54. Coronal image 61 there is mild dilatation of the small bowel just above the anastomosis with some fecal like material. Distal small bowel in right lower quadrant is smaller caliber. Findings are highly suspicious for at least partial small bowel obstruction at the anastomosis site in right lower quadrant. No any contrast material is noted within distal small bowel or within colon. 2. There is a low lying cecum. Some colonic stool noted within cecum. No pericecal inflammation. 3. No hydronephrosis or hydroureter. 4. Mild fatty infiltration of the liver. 5. Small calcified gallstone within gallbladder measures 3.5 mm. 6. There is patchy central lung infiltrate in right middle lobe. Patchy central infiltrate noted in left lower lobe. Multifocal  pneumonia cannot be excluded. Clinical correlation is necessary. Electronically Signed   By: Lahoma Crocker M.D.   On: 06/07/2016 11:30     Assessment/Plan Active Problems:   SBO (small bowel obstruction) ([facility])   Lung infiltrate   Diabetes mellitus with complication ([facility])   HLD (hyperlipidemia)   Essential hypertension   Gout   SBO: Partial. Patient has SBO type symptoms multiple times per year of this is worse than her normal. Currently passing her gastric secretions. CT results as above. History of abdominal hernia surgery so suspect adhesions. Surgery consulted and following. No NG tube at this time the low threshold for starting - Med surge - NG tube if emesis returns - pain mgt - KUB in am - IVF  Lung infiltrate: Low suspicion for pneumonia given complete absence of chest pain, shortness breath, cough, fevers, recent URI symptoms. Monitor at this point time. May simply be inflammatory from pneumonitis or small amount of gastric secretions  aspirated during multiple bouts of emesis. No antibiotics at this time - Repeat CXR if condition worsens - Consider ABX of condition worsens  DM:  - levemir at 1/2 home dose until taking PO - SSI - A1c  HTN: on losartan - Hydralazine IV prn until taking po  HLD: - continue statin when taking po   Gout: - continue allopurinol when taking PO     DVT prophylaxis: Lovenox  Code Status: FULL  Family Communication: none  Disposition Plan: pending improvement  Consults called: surgery  Admission status: inpt    Moise Friday J MD [facility]  If 7PM-7AM, please contact night-coverage [email or web address] Password [facility]  06/07/2016, 6:17 PM

## 2016-06-07 NOTE — Care Management Note (Signed)
Case Management Note  Patient Details  Name: Krisna Degeus MRN: [id number] Date of Birth: 1950/02/06  Subjective/Objective:                  Patient presents with generalized abdominal pain with nausea for approximately 24-36 hours./ From home with spouse.  Action/Plan: Follow for disposition needs.   Expected Discharge Date:  06/10/16               Expected Discharge Plan:  [place]  In-House Referral:     Discharge planning Services  CM Consult  Post Acute Care Choice:    Choice offered to:     DME Arranged:    DME Agency:     HH Arranged:    HH Agency:     Status of Service:  In process, will continue to follow  Medicare Important Message Given:    Date Medicare IM Given:    Medicare IM give by:    Date Additional Medicare IM Given:    Additional Medicare Important Message give by:     If discussed at [place] of Stay Meetings, dates discussed:    Additional Comments:  Fuller Mandril, RN 06/07/2016, 3:44 PM

## 2016-06-07 NOTE — Progress Notes (Signed)
Akelah Moten is a 66 y.o. female patient admitted from ED awake, alert - oriented  X 4 - no acute distress noted.  VSS - Blood pressure 158/67, pulse 106, temperature 99.7 F (37.6 C), temperature source Oral, resp. rate 18, height 5\' 5"  (1.651 m), weight 124.694 kg (274 lb 14.4 oz), SpO2 93 %.    IV in place, occlusive dsg intact without redness.  Orientation to room, and floor completed with information packet given to patient/family. Admission INP armband ID verified with patient/family, and in place.   SR up x 2, fall assessment complete, with patient and family able to verbalize understanding of risk associated with falls, and verbalized understanding to call nsg before up out of bed.  Call light within reach, patient able to voice, and demonstrate understanding.  Skin, clean-dry- intact without evidence of bruising, or skin tears.   No evidence of skin break down noted on exam.  Will cont to eval and treat per MD orders.  Lynann Beaver, RN 06/07/2016 6:01 PM

## 2016-06-07 NOTE — ED Notes (Signed)
IV not running. Arm repositioned and line flushed. Placed on pump. Pt denies discomfort at site. Denies nausea.

## 2016-06-07 NOTE — ED Notes (Signed)
Attempted to call report x 1  

## 2016-06-07 NOTE — Consult Note (Signed)
Reason for Consult:PSBO PCP:  NONE Referring Physician: Dr. Nat Christen  [name] is an 66 y.o. female.  HPI: pt presented to the ED this AM with complaints of vomiting and severe heart burn. She reports getting sick yesterday  AM.  It started as nausea, she was better and ate lunch.  After lunch she started having nausea again.  Nausea lasted all day and she started vomiting early AM at 0130, and around 0530 this AM.  She has not vomited since that time.  She continues to feel nauseated, not having allot of pain and is not overly distended at this time.  She has had these a couple times a year since 1997.  She can usually work her way thru it  Without assistance.  She has one other hospiitalzation for SBO about 3 years ago in [place].  She had her surgery done in [place].  This time is just feels different and she came for evaluation.  Her daughter is scheduled to be married this weekend.  She is very anxious.     Work up in the ED shows a low grade temperature, elevated glucose, and WBC.  CT scan shows There are postsurgical changes in small bowel with side-to-side anastomosis in right lower quadrant please see axial image 54. Coronal image 61 there is mild dilatation of the small bowel just above the anastomosis with some fecal like material. Distal small bowel in right lower quadrant is smaller caliber. Findings are highly suspicious for at least partial small bowel obstruction at the anastomosis site in right lower quadrant. No any contrast material is noted within distal small bowel or within colon.   There is a low lying cecum. Some colonic stool noted within cecum. No pericecal inflammation.  No hydronephrosis or hydroureter.  Mild fatty infiltration of the liver.  Small calcified gallstone within gallbladder measures 3.5 mm.  There is patchy central lung infiltrate in right middle lobe.  Patchy central infiltrate noted in left lower lobe. Multifocal pneumonia cannot be excluded.     Past Medical History   Diagnosis Date  . Diabetes mellitus without complication ([facility])   . Hypertension   . Arthritis   . Cancer ([facility])     skin CA  . Diverticulitis     Past Surgical History  Procedure Laterality Date  . Laparotomy, colostomy for perforated colon, reversal 6 weeks later with post op complications 2831     History reviewed. No pertinent family history.  Social History:  reports that she quit smoking about 29 years ago. She does not have any smokeless tobacco history on file. She reports that she drinks alcohol. She reports that she does not use illicit drugs.  Allergies:  Allergies  Allergen Reactions  . Lisinopril Cough  . Metformin And Related Other (See Comments)    Prior to Admission medications   Medication Sig Start Date End Date Taking? Authorizing Provider  allopurinol (ZYLOPRIM) 300 MG tablet Take 300 mg by mouth daily.   Yes Historical Provider, MD  aspirin 325 MG tablet Take 325 mg by mouth daily.   Yes Historical Provider, MD  atorvastatin (LIPITOR) 40 MG tablet Take 40 mg by mouth daily.   Yes Historical Provider, MD  carboxymethylcellulose (REFRESH PLUS) 0.5 % SOLN Apply 1 drop to eye daily as needed (dry eyes).   Yes Historical Provider, MD  Coenzyme Q10 (CO Q 10 PO) Take 1 tablet by mouth daily.   Yes Historical Provider, MD  glucosamine-chondroitin 500-400 MG tablet Take 1  tablet by mouth 2 (two) times daily.   Yes Historical Provider, MD  ibuprofen (ADVIL,MOTRIN) 200 MG tablet Take 800 mg by mouth every 6 (six) hours as needed for mild pain.   Yes Historical Provider, MD  insulin detemir (LEVEMIR) 100 UNIT/ML injection Inject 90 Units into the skin 2 (two) times daily.   Yes Historical Provider, MD  KRILL OIL PO Take 1 capsule by mouth daily.   Yes Historical Provider, MD  Liraglutide (VICTOZA) 18 MG/3ML SOPN Inject 1.8 mg into the skin daily.   Yes Historical Provider, MD  losartan (COZAAR) 100 MG tablet Take 100 mg by mouth daily.   Yes Historical Provider, MD   Melatonin 5 MG TABS Take 10 mg by mouth at bedtime.   Yes Historical Provider, MD  Multiple Vitamins-Minerals (MULTIVITAMIN WITH MINERALS) tablet Take 1 tablet by mouth daily.   Yes Historical Provider, MD  nystatin-triamcinolone ointment (MYCOLOG) Apply 1 application topically daily as needed (itching).   Yes Historical Provider, MD     Results for orders placed or performed during the hospital encounter of 06/07/16 (from the past 48 hour(s))  Lipase, blood     Status: None   Collection Time: 06/07/16  8:26 AM  Result Value Ref Range   Lipase 22 11 - 51 U/L  Comprehensive metabolic panel     Status: Abnormal   Collection Time: 06/07/16  8:26 AM  Result Value Ref Range   Sodium 142 135 - 145 mmol/L   Potassium 3.8 3.5 - 5.1 mmol/L   Chloride 106 101 - 111 mmol/L   CO2 25 22 - 32 mmol/L   Glucose, Bld 154 (H) 65 - 99 mg/dL   BUN 19 6 - 20 mg/dL   Creatinine, Ser 1.09 (H) 0.44 - 1.00 mg/dL   Calcium 9.9 8.9 - 10.3 mg/dL   Total Protein 7.3 6.5 - 8.1 g/dL   Albumin 4.4 3.5 - 5.0 g/dL   AST 24 15 - 41 U/L   ALT 21 14 - 54 U/L   Alkaline Phosphatase 132 (H) 38 - 126 U/L   Total Bilirubin 0.9 0.3 - 1.2 mg/dL   GFR calc non Af Amer 51 (L) >60 mL/min   GFR calc Af Amer 59 (L) >60 mL/min    Comment: (NOTE) The eGFR has been calculated using the CKD EPI equation. This calculation has not been validated in all clinical situations. eGFR's persistently <60 mL/min signify possible Chronic Kidney Disease.    Anion gap 11 5 - 15  CBC     Status: Abnormal   Collection Time: 06/07/16  8:26 AM  Result Value Ref Range   WBC 13.1 (H) 4.0 - 10.5 K/uL   RBC 4.46 3.87 - 5.11 MIL/uL   Hemoglobin 13.2 12.0 - 15.0 g/dL   HCT 40.3 36.0 - 46.0 %   MCV 90.4 78.0 - 100.0 fL   MCH 29.6 26.0 - 34.0 pg   MCHC 32.8 30.0 - 36.0 g/dL   RDW 14.6 11.5 - 15.5 %   Platelets 182 150 - 400 K/uL    Ct Abdomen Pelvis W Contrast  06/07/2016  CLINICAL DATA:  Abdominal pain and burping starting Sunday, history  of colostomy and reversal, history of small bowel obstruction. EXAM: CT ABDOMEN AND PELVIS WITH CONTRAST TECHNIQUE: Multidetector CT imaging of the abdomen and pelvis was performed using the standard protocol following bolus administration of intravenous contrast. CONTRAST:  149m ISOVUE-300 IOPAMIDOL (ISOVUE-300) INJECTION 61% COMPARISON:  None. FINDINGS: Lower chest: There is patchy infiltrate  in right middle lobe. Patchy central infiltrate is noted in left lower lobe. Pneumonia cannot be excluded. Clinical correlation is necessary. Hepatobiliary: Mild fatty infiltration of the liver. No intrahepatic biliary ductal dilatation. Small calcified gallstone within gallbladder measures 3.5 mm. Pancreas: Mild atrophic pancreas. Spleen: Spleen is unremarkable. Adrenals/Urinary Tract: No adrenal gland mass. Kidneys are symmetrical in size and enhancement. No hydronephrosis or hydroureter. Urinary bladder is under distended grossly unremarkable. Delayed renal images shows bilateral renal symmetrical excretion. Bilateral visualized proximal ureter is unremarkable. Stomach/Bowel: There is no gastric outlet obstruction. Mild dilated proximal small bowel loops with contrast material in upper abdomen and left mid abdomen. Some postsurgical changes are noted mid small bowel anterior abdomen axial image 60. No any contrast material is noted in distal small bowel loops or within colon. There are postsurgical changes in small bowel with side-to-side anastomosis in right lower quadrant please see axial image 54. Coronal image 61 there is mild dilatation of the small bowel just above the anastomosis with some fecal like material. Distal small bowel in right lower quadrant is smaller caliber. Findings are highly suspicious for at least partial small bowel obstruction at the anastomosis site in right lower quadrant. No any contrast material noted within colon or terminal small bowel. There is a low lying cecum. Moderate stool noted  within cecum and right colon. No pericecal inflammation. Surgical clips are noted within lower abdomen and pelvis. Vascular/Lymphatic: Atherosclerotic calcifications of abdominal aorta and iliac arteries are noted. No aortic aneurysm. No retroperitoneal or mesenteric adenopathy. Reproductive: No adnexal masses noted. The uterus is anteflexed no pelvic free fluid. Other: There is scarring in right lower quadrant anterior abdominal wall from previous colostomy. Some skin thickening, scarring or cellulitis noted lower anterior abdominal wall. There is scarring in right paraumbilical region anterior abdominal wall. No ascites or free abdominal air. Musculoskeletal: Sagittal images of the spine shows degenerative changes thoracolumbar spine. No destructive bony lesions are noted within pelvis. IMPRESSION: 1. There are postsurgical changes in small bowel with side-to-side anastomosis in right lower quadrant please see axial image 54. Coronal image 61 there is mild dilatation of the small bowel just above the anastomosis with some fecal like material. Distal small bowel in right lower quadrant is smaller caliber. Findings are highly suspicious for at least partial small bowel obstruction at the anastomosis site in right lower quadrant. No any contrast material is noted within distal small bowel or within colon. 2. There is a low lying cecum. Some colonic stool noted within cecum. No pericecal inflammation. 3. No hydronephrosis or hydroureter. 4. Mild fatty infiltration of the liver. 5. Small calcified gallstone within gallbladder measures 3.5 mm. 6. There is patchy central lung infiltrate in right middle lobe. Patchy central infiltrate noted in left lower lobe. Multifocal pneumonia cannot be excluded. Clinical correlation is necessary. Electronically Signed   By: Natasha Mead M.D.   On: 06/07/2016 11:30    Review of Systems  Constitutional: Negative.   HENT: Negative.   Eyes: Negative.   Respiratory: Negative.    Cardiovascular: Negative.   Gastrointestinal: Positive for nausea, vomiting and abdominal pain. Negative for diarrhea, constipation, blood in stool and melena.  Musculoskeletal: Negative.   Skin: Negative.   Neurological: Negative.   Endo/Heme/Allergies: Negative.   Psychiatric/Behavioral: Negative.    Blood pressure 133/51, pulse 105, temperature 100 F (37.8 C), temperature source Oral, resp. rate 18, height 5\' 5"  (1.651 m), weight 126.554 kg (279 lb), SpO2 94 %. Physical Exam  Constitutional: She is oriented  to person, place, and time. She appears well-developed and well-nourished. No distress.  HENT:  Head: Normocephalic and atraumatic.  Nose: Nose normal.  Eyes: Right eye exhibits no discharge. Left eye exhibits no discharge. No scleral icterus.  Neck: No JVD present. No tracheal deviation present. No thyromegaly present.  Cardiovascular: Normal rate, regular rhythm, normal heart sounds and intact distal pulses.   No murmur heard. Respiratory: Effort normal. No respiratory distress. She has no wheezes. She has rales (both bases, I heard more on the left than the right.). She exhibits no tenderness.  GI: Soft. Bowel sounds are normal. She exhibits distension (some very large abdomen, hard to judge). She exhibits no mass. There is no tenderness. There is no rebound and no guarding.  Large midline abdominal scar, upper scar ventral hernia.   Scar from old colostomy site.  Musculoskeletal: She exhibits no edema or tenderness.  Lymphadenopathy:    She has no cervical adenopathy.  Neurological: She is alert and oriented to person, place, and time. No cranial nerve deficit.  Skin: Skin is warm and dry. No rash noted. No erythema. No pallor.  Psychiatric: She has a normal mood and affect. Her behavior is normal. Judgment and thought content normal.    Assessment/Plan: PSBO, s/p colon resection for perforation along with reversal 1997. Upper midline abdominal ventral hernia RML  infiltrate, and LLL infiltrate   (Probable CAP)  Perhaps secondary to emesis last pM   AODM Hypertension Arthritis HX of skin cancer Morbid obesity    Plan:  She is being admitted by Medicine.  Treatment of her pneumonia, bowel rest and hydration.  If she has further nausea or vomiting I would place an NG.  She has not vomited since 0530 this AM  Film in AM.   [name] 06/07/2016, 2:35 PM

## 2016-06-08 ENCOUNTER — Inpatient Hospital Stay (HOSPITAL_COMMUNITY): Payer: Medicare Other

## 2016-06-08 DIAGNOSIS — E118 Type 2 diabetes mellitus with unspecified complications: Secondary | ICD-10-CM

## 2016-06-08 DIAGNOSIS — E785 Hyperlipidemia, unspecified: Secondary | ICD-10-CM

## 2016-06-08 DIAGNOSIS — I10 Essential (primary) hypertension: Secondary | ICD-10-CM

## 2016-06-08 DIAGNOSIS — R918 Other nonspecific abnormal finding of lung field: Secondary | ICD-10-CM

## 2016-06-08 LAB — BASIC METABOLIC PANEL
Anion gap: 6 (ref 5–15)
BUN: 22 mg/dL — ABNORMAL HIGH (ref 6–20)
CHLORIDE: 109 mmol/L (ref 101–111)
CO2: 28 mmol/L (ref 22–32)
CREATININE: 0.93 mg/dL (ref 0.44–1.00)
Calcium: 8.7 mg/dL — ABNORMAL LOW (ref 8.9–10.3)
Glucose, Bld: 111 mg/dL — ABNORMAL HIGH (ref 65–99)
Potassium: 4.3 mmol/L (ref 3.5–5.1)
SODIUM: 143 mmol/L (ref 135–145)

## 2016-06-08 LAB — CBC
HCT: 32.8 % — ABNORMAL LOW (ref 36.0–46.0)
HEMOGLOBIN: 10.4 g/dL — AB (ref 12.0–15.0)
MCH: 29.1 pg (ref 26.0–34.0)
MCHC: 31.7 g/dL (ref 30.0–36.0)
MCV: 91.6 fL (ref 78.0–100.0)
PLATELETS: 144 10*3/uL — AB (ref 150–400)
RBC: 3.58 MIL/uL — AB (ref 3.87–5.11)
RDW: 14.8 % (ref 11.5–15.5)
WBC: 8.7 10*3/uL (ref 4.0–10.5)

## 2016-06-08 LAB — GLUCOSE, CAPILLARY
Glucose-Capillary: 106 mg/dL — ABNORMAL HIGH (ref 65–99)
Glucose-Capillary: 141 mg/dL — ABNORMAL HIGH (ref 65–99)
Glucose-Capillary: 147 mg/dL — ABNORMAL HIGH (ref 65–99)

## 2016-06-08 MED ORDER — SENNOSIDES-DOCUSATE SODIUM 8.6-50 MG PO TABS: 1.0000 | ORAL_TABLET | Freq: Two times a day (BID) | ORAL | Status: DC

## 2016-06-08 MED ORDER — TRAMADOL HCL 50 MG PO TABS: 50.0000 mg | ORAL_TABLET | Freq: Three times a day (TID) | ORAL | Status: DC | PRN

## 2016-06-08 MED ORDER — POLYETHYLENE GLYCOL 3350 17 G PO PACK: 17.0000 g | PACK | Freq: Every day | ORAL | Status: DC | PRN

## 2016-06-08 MED ORDER — SENNOSIDES-DOCUSATE SODIUM 8.6-50 MG PO TABS
1.0000 | ORAL_TABLET | Freq: Two times a day (BID) | ORAL | Status: DC
  Administered 2016-06-08: 1 via ORAL
  Filled 2016-06-08: qty 1

## 2016-06-08 NOTE — Progress Notes (Signed)
[name] to be D/C'd to home per MD order.  Discussed with the patient and all questions fully answered.  VSS, Skin clean, dry and intact without evidence of skin break down, no evidence of skin tears noted. IV catheter discontinued intact. Site without signs and symptoms of complications. Dressing and pressure applied.  An After Visit Summary was printed and given to the patient. Patient received prescriptions.  D/c education completed with patient/family including follow up instructions, medication list, d/c activities limitations if indicated, with other d/c instructions as indicated by MD - patient able to verbalize understanding, all questions fully answered.   Patient instructed to return to ED, call 911, or call MD for any changes in condition.   Patient escorted via [place], and D/C home via private auto.  [name] 06/08/2016 2:20 PM

## 2016-06-08 NOTE — Progress Notes (Signed)
Patient ID: [name], female   DOB: [birth year], 66 y.o.   MRN: [id number]     [facility]      [address] [place], [place] [address]    Phone: [phone] FAX: [phone]     Subjective: BM last night, flatus this AM. no n/v. Wbc normalized, k 4.3.    Objective:  Vital signs:  Filed Vitals:   06/07/16 1545 06/07/16 1753 06/07/16 2232 06/08/16 0550  BP: 152/62 158/67 121/41 119/56  Pulse: 100 106 82 72  Temp:  99.7 F (37.6 C) 98.3 F (36.8 C) 98 F (36.7 C)  TempSrc:  Oral Oral Oral  Resp:  '18 18 18  '$ Height:  '5\' 5"'$  (1.651 m)    Weight:  124.694 kg (274 lb 14.4 oz)    SpO2: 95% 93% 96% 97%    Last BM Date: 06/07/16  Intake/Output   Yesterday:  06/08 0701 - 06/09 0700 In: 106.7 [I.V.:106.7] Out: -  This shift: I/O last 3 completed shifts: In: 106.7 [I.V.:106.7] Out: -     Physical Exam: General: Pt awake/alert/oriented x4 in no acute distress  Abdomen: Soft.  Nondistended.  Non tender.  No evidence of peritonitis.  No incarcerated hernias.    Problem List:   Active Problems:   SBO (small bowel obstruction) ([facility])   Lung infiltrate   Diabetes mellitus with complication ([facility])   HLD (hyperlipidemia)   Essential hypertension   Gout    Results:   Labs: Results for orders placed or performed during the hospital encounter of 06/07/16 (from the past 48 hour(s))  Lipase, blood     Status: None   Collection Time: 06/07/16  8:26 AM  Result Value Ref Range   Lipase 22 11 - 51 U/L  Comprehensive metabolic panel     Status: Abnormal   Collection Time: 06/07/16  8:26 AM  Result Value Ref Range   Sodium 142 135 - 145 mmol/L   Potassium 3.8 3.5 - 5.1 mmol/L   Chloride 106 101 - 111 mmol/L   CO2 25 22 - 32 mmol/L   Glucose, Bld 154 (H) 65 - 99 mg/dL   BUN 19 6 - 20 mg/dL   Creatinine, Ser 1.09 (H) 0.44 - 1.00 mg/dL   Calcium 9.9 8.9 - 10.3 mg/dL   Total Protein 7.3 6.5 - 8.1 g/dL   Albumin 4.4 3.5 -  5.0 g/dL   AST 24 15 - 41 U/L   ALT 21 14 - 54 U/L   Alkaline Phosphatase 132 (H) 38 - 126 U/L   Total Bilirubin 0.9 0.3 - 1.2 mg/dL   GFR calc non Af Amer 51 (L) >60 mL/min   GFR calc Af Amer 59 (L) >60 mL/min    Comment: (NOTE) The eGFR has been calculated using the CKD EPI equation. This calculation has not been validated in all clinical situations. eGFR's persistently <60 mL/min signify possible Chronic Kidney Disease.    Anion gap 11 5 - 15  CBC     Status: Abnormal   Collection Time: 06/07/16  8:26 AM  Result Value Ref Range   WBC 13.1 (H) 4.0 - 10.5 K/uL   RBC 4.46 3.87 - 5.11 MIL/uL   Hemoglobin 13.2 12.0 - 15.0 g/dL   HCT 40.3 36.0 - 46.0 %   MCV 90.4 78.0 - 100.0 fL   MCH 29.6 26.0 - 34.0 pg   MCHC 32.8 30.0 - 36.0 g/dL   RDW 14.6 11.5 - 15.5 %  Platelets 182 150 - 400 K/uL  Urinalysis, Routine w reflex microscopic     Status: Abnormal   Collection Time: 06/07/16  4:24 PM  Result Value Ref Range   Color, Urine YELLOW YELLOW   APPearance CLEAR CLEAR   Specific Gravity, Urine 1.020 1.005 - 1.030   pH 5.5 5.0 - 8.0   Glucose, UA NEGATIVE NEGATIVE mg/dL   Hgb urine dipstick NEGATIVE NEGATIVE   Bilirubin Urine SMALL (A) NEGATIVE   Ketones, ur 15 (A) NEGATIVE mg/dL   Protein, ur NEGATIVE NEGATIVE mg/dL   Nitrite NEGATIVE NEGATIVE   Leukocytes, UA NEGATIVE NEGATIVE    Comment: MICROSCOPIC NOT DONE ON URINES WITH NEGATIVE PROTEIN, BLOOD, LEUKOCYTES, NITRITE, OR GLUCOSE <1000 mg/dL.  Glucose, capillary     Status: Abnormal   Collection Time: 06/07/16  8:01 PM  Result Value Ref Range   Glucose-Capillary 168 (H) 65 - 99 mg/dL  Glucose, capillary     Status: Abnormal   Collection Time: 06/08/16 12:08 AM  Result Value Ref Range   Glucose-Capillary 147 (H) 65 - 99 mg/dL  Glucose, capillary     Status: Abnormal   Collection Time: 06/08/16  5:51 AM  Result Value Ref Range   Glucose-Capillary 106 (H) 65 - 99 mg/dL  CBC     Status: Abnormal   Collection Time: 06/08/16   6:12 AM  Result Value Ref Range   WBC 8.7 4.0 - 10.5 K/uL   RBC 3.58 (L) 3.87 - 5.11 MIL/uL   Hemoglobin 10.4 (L) 12.0 - 15.0 g/dL    Comment: DELTA CHECK NOTED REPEATED TO VERIFY    HCT 32.8 (L) 36.0 - 46.0 %   MCV 91.6 78.0 - 100.0 fL   MCH 29.1 26.0 - 34.0 pg   MCHC 31.7 30.0 - 36.0 g/dL   RDW 14.8 11.5 - 15.5 %   Platelets 144 (L) 150 - 400 K/uL  Basic metabolic panel     Status: Abnormal   Collection Time: 06/08/16  6:12 AM  Result Value Ref Range   Sodium 143 135 - 145 mmol/L   Potassium 4.3 3.5 - 5.1 mmol/L   Chloride 109 101 - 111 mmol/L   CO2 28 22 - 32 mmol/L   Glucose, Bld 111 (H) 65 - 99 mg/dL   BUN 22 (H) 6 - 20 mg/dL   Creatinine, Ser 0.93 0.44 - 1.00 mg/dL   Calcium 8.7 (L) 8.9 - 10.3 mg/dL   GFR calc non Af Amer >60 >60 mL/min   GFR calc Af Amer >60 >60 mL/min    Comment: (NOTE) The eGFR has been calculated using the CKD EPI equation. This calculation has not been validated in all clinical situations. eGFR's persistently <60 mL/min signify possible Chronic Kidney Disease.    Anion gap 6 5 - 15    Imaging / Studies: Ct Abdomen Pelvis W Contrast  06/07/2016  CLINICAL DATA:  Abdominal pain and burping starting Sunday, history of colostomy and reversal, history of small bowel obstruction. EXAM: CT ABDOMEN AND PELVIS WITH CONTRAST TECHNIQUE: Multidetector CT imaging of the abdomen and pelvis was performed using the standard protocol following bolus administration of intravenous contrast. CONTRAST:  122m ISOVUE-300 IOPAMIDOL (ISOVUE-300) INJECTION 61% COMPARISON:  None. FINDINGS: Lower chest: There is patchy infiltrate in right middle lobe. Patchy central infiltrate is noted in left lower lobe. Pneumonia cannot be excluded. Clinical correlation is necessary. Hepatobiliary: Mild fatty infiltration of the liver. No intrahepatic biliary ductal dilatation. Small calcified gallstone within gallbladder measures 3.5 mm. Pancreas:  Mild atrophic pancreas. Spleen: Spleen is  unremarkable. Adrenals/Urinary Tract: No adrenal gland mass. Kidneys are symmetrical in size and enhancement. No hydronephrosis or hydroureter. Urinary bladder is under distended grossly unremarkable. Delayed renal images shows bilateral renal symmetrical excretion. Bilateral visualized proximal ureter is unremarkable. Stomach/Bowel: There is no gastric outlet obstruction. Mild dilated proximal small bowel loops with contrast material in upper abdomen and left mid abdomen. Some postsurgical changes are noted mid small bowel anterior abdomen axial image 60. No any contrast material is noted in distal small bowel loops or within colon. There are postsurgical changes in small bowel with side-to-side anastomosis in right lower quadrant please see axial image 54. Coronal image 61 there is mild dilatation of the small bowel just above the anastomosis with some fecal like material. Distal small bowel in right lower quadrant is smaller caliber. Findings are highly suspicious for at least partial small bowel obstruction at the anastomosis site in right lower quadrant. No any contrast material noted within colon or terminal small bowel. There is a low lying cecum. Moderate stool noted within cecum and right colon. No pericecal inflammation. Surgical clips are noted within lower abdomen and pelvis. Vascular/Lymphatic: Atherosclerotic calcifications of abdominal aorta and iliac arteries are noted. No aortic aneurysm. No retroperitoneal or mesenteric adenopathy. Reproductive: No adnexal masses noted. The uterus is anteflexed no pelvic free fluid. Other: There is scarring in right lower quadrant anterior abdominal wall from previous colostomy. Some skin thickening, scarring or cellulitis noted lower anterior abdominal wall. There is scarring in right paraumbilical region anterior abdominal wall. No ascites or free abdominal air. Musculoskeletal: Sagittal images of the spine shows degenerative changes thoracolumbar spine. No  destructive bony lesions are noted within pelvis. IMPRESSION: 1. There are postsurgical changes in small bowel with side-to-side anastomosis in right lower quadrant please see axial image 54. Coronal image 61 there is mild dilatation of the small bowel just above the anastomosis with some fecal like material. Distal small bowel in right lower quadrant is smaller caliber. Findings are highly suspicious for at least partial small bowel obstruction at the anastomosis site in right lower quadrant. No any contrast material is noted within distal small bowel or within colon. 2. There is a low lying cecum. Some colonic stool noted within cecum. No pericecal inflammation. 3. No hydronephrosis or hydroureter. 4. Mild fatty infiltration of the liver. 5. Small calcified gallstone within gallbladder measures 3.5 mm. 6. There is patchy central lung infiltrate in right middle lobe. Patchy central infiltrate noted in left lower lobe. Multifocal pneumonia cannot be excluded. Clinical correlation is necessary. Electronically Signed   By: Lahoma Crocker M.D.   On: 06/07/2016 11:30    Medications / Allergies:  Scheduled Meds: . diatrizoate meglumine-sodium  30 mL Oral Once  . enoxaparin (LOVENOX) injection  40 mg Subcutaneous Q24H  . insulin aspart  0-5 Units Subcutaneous QHS  . insulin aspart  0-9 Units Subcutaneous TID WC  . insulin detemir  45 Units Subcutaneous BID   Continuous Infusions: . sodium chloride 125 mL/hr at 06/07/16 1900   PRN Meds:.hydrALAZINE, morphine injection, nystatin-triamcinolone ointment, ondansetron **OR** ondansetron (ZOFRAN) IV  Antibiotics: Anti-infectives    None        Assessment/Plan pSBO-likely adhesive disease.  Clinically resolving.  Will allow for clears.  Await films.  Mobilize. VTE prophylaxis-scd, lovenox FEN-clears, IVF Dispo-psbo  Erby Pian, ANP-BC [facility] Pager [phone](7A-4:30P) For consults and floor pages call  [phone](7A-4:30P)  06/08/2016 8:20 AM

## 2016-06-08 NOTE — Discharge Summary (Addendum)
Physician Discharge Summary   Patient ID: [name] MRN: [id number] DOB/AGE: [birth year] 68 y.o.  Admit date: 06/07/2016 Discharge date: 06/08/2016  Primary Care Physician:  Aretta Nip, MD  Discharge Diagnoses:    . SBO (small bowel obstruction) ([place]) Hypertension Diabetes  Consults:  Gen. surgery  Recommendations for Outpatient Follow-up:  1. Patient was recommended for liquid diet for couple of days and advance as tolerated. Avoid constipation 2. Please repeat CBC/BMET at next visit   DIET: Full liquid diet and advance as tolerated    Allergies:   Allergies  Allergen Reactions  . Lisinopril Cough  . Metformin And Related Other (See Comments)     DISCHARGE MEDICATIONS: Current Discharge Medication List    START taking these medications   Details  polyethylene glycol (MIRALAX / GLYCOLAX) packet Take 17 g by mouth daily as needed for mild constipation or moderate constipation. Qty: 30 each, Refills: 0    senna-docusate (SENOKOT-S) 8.6-50 MG tablet Take 1 tablet by mouth 2 (two) times daily. For constipation Qty: 60 tablet, Refills: 3    traMADol (ULTRAM) 50 MG tablet Take 1 tablet (50 mg total) by mouth every 8 (eight) hours as needed for severe pain. Qty: 15 tablet, Refills: 0      CONTINUE these medications which have NOT CHANGED   Details  allopurinol (ZYLOPRIM) 300 MG tablet Take 300 mg by mouth daily.    aspirin 325 MG tablet Take 325 mg by mouth daily.    atorvastatin (LIPITOR) 40 MG tablet Take 40 mg by mouth daily.    carboxymethylcellulose (REFRESH PLUS) 0.5 % SOLN Apply 1 drop to eye daily as needed (dry eyes).    Coenzyme Q10 (CO Q 10 PO) Take 1 tablet by mouth daily.    glucosamine-chondroitin 500-400 MG tablet Take 1 tablet by mouth 2 (two) times daily.    ibuprofen (ADVIL,MOTRIN) 200 MG tablet Take 800 mg by mouth every 6 (six) hours as needed for mild pain.    insulin detemir (LEVEMIR) 100 UNIT/ML injection Inject 90 Units into the  skin 2 (two) times daily.    KRILL OIL PO Take 1 capsule by mouth daily.    Liraglutide (VICTOZA) 18 MG/3ML SOPN Inject 1.8 mg into the skin daily.    losartan (COZAAR) 100 MG tablet Take 100 mg by mouth daily.    Melatonin 5 MG TABS Take 10 mg by mouth at bedtime.    Multiple Vitamins-Minerals (MULTIVITAMIN WITH MINERALS) tablet Take 1 tablet by mouth daily.    nystatin-triamcinolone ointment (MYCOLOG) Apply 1 application topically daily as needed (itching).         Brief H and P: For complete details please refer to admission H and P, but in brief [name] is a 66 y.o. female with medical history significant of diabetes, HTN, diverticulitis, SBO, history of abdominal surgery presented for treatment for abdominal pain. Patient states that she gets SBO type attacks several times per year typically she is able to manage these all without the hospital however this episode was much more severe than normal. When the patient developed abdominal pain that was fairly diffuse. Gradual onset, associated with nausea and vomiting and a great deal of belching. Patient stated that she ate some food a day before the admission which made her symptoms significantly worse. Imaging showed SBO, patient was admitted for further workup  Hospital Course:   SBO: Partial. Improved, per patient has a recurrent history -  History of abdominal hernia surgery so suspect adhesions. CT  of the abdomen showed mild dilatation of the small bowel just above the anastomosis with some fecal-like material findings suspicious for partial small bowel obstruction at the anastomosis site in the right lower quadrant.  - NG tube was not placed as patient was not vomiting. She had a bowel movement last night and tolerating clear liquid diet without any difficulty. Repeat KUB was obtained which showed SBO had resolved.  - Per surgery okay to discharge home per patient's request  (her daughter is getting married tomorrow), patient  was recommended to continue full liquid diet for a couple of days and then advance as tolerated. - She was also recommended to avoid constipation and was given prescription for MiraLAX and Senokot-S.   Lung infiltrate: - CT of the abdomen showed patchy infiltrated in the right middle lobe, patchy central infiltrate in the left lower lobe, patient may have had a small aspiration with pneumonitis with multiple bouts of emesis.Marland Kitchen However she had no fevers or chills or URI symptoms, chest pain or shortness of breath.  No antibiotics at this time - Repeat chest x-ray outpatient if any concerns  DM:  - Continue Levemir and insulin management and home  HTN: on losartan Currently stable  HLD: - continue statin    Gout: - continue allopurinol    Day of Discharge BP 173/66 mmHg  Pulse 96  Temp(Src) 98.7 F (37.1 C) (Oral)  Resp 18  Ht 5\' 5"  (1.651 m)  Wt 124.694 kg (274 lb 14.4 oz)  BMI 45.75 kg/m2  SpO2 99%  Physical Exam: General: Alert and awake oriented x3 not in any acute distress. HEENT: anicteric sclera, pupils reactive to light and accommodation CVS: S1-S2 clear no murmur rubs or gallops Chest: clear to auscultation bilaterally, no wheezing rales or rhonchi Abdomen: soft nontender, nondistended, normal bowel sounds Extremities: no cyanosis, clubbing or edema noted bilaterally Neuro: Cranial nerves II-XII intact, no focal neurological deficits   The results of significant diagnostics from this hospitalization (including imaging, microbiology, ancillary and laboratory) are listed below for reference.    LAB RESULTS: Basic Metabolic Panel:  Recent Labs Lab 06/07/16 0826 06/08/16 0612  NA 142 143  K 3.8 4.3  CL 106 109  CO2 25 28  GLUCOSE 154* 111*  BUN 19 22*  CREATININE 1.09* 0.93  CALCIUM 9.9 8.7*   Liver Function Tests:  Recent Labs Lab 06/07/16 0826  AST 24  ALT 21  ALKPHOS 132*  BILITOT 0.9  PROT 7.3  ALBUMIN 4.4    Recent Labs Lab  06/07/16 0826  LIPASE 22   No results for input(s): AMMONIA in the last 168 hours. CBC:  Recent Labs Lab 06/07/16 0826 06/08/16 0612  WBC 13.1* 8.7  HGB 13.2 10.4*  HCT 40.3 32.8*  MCV 90.4 91.6  PLT 182 144*   Cardiac Enzymes: No results for input(s): CKTOTAL, CKMB, CKMBINDEX, TROPONINI in the last 168 hours. BNP: Invalid input(s): POCBNP CBG:  Recent Labs Lab 06/08/16 0551 06/08/16 1208  GLUCAP 106* 141*    Significant Diagnostic Studies:  Ct Abdomen Pelvis W Contrast  06/07/2016  CLINICAL DATA:  Abdominal pain and burping starting Sunday, history of colostomy and reversal, history of small bowel obstruction. EXAM: CT ABDOMEN AND PELVIS WITH CONTRAST TECHNIQUE: Multidetector CT imaging of the abdomen and pelvis was performed using the standard protocol following bolus administration of intravenous contrast. CONTRAST:  152mL ISOVUE-300 IOPAMIDOL (ISOVUE-300) INJECTION 61% COMPARISON:  None. FINDINGS: Lower chest: There is patchy infiltrate in right middle lobe. Patchy central  infiltrate is noted in left lower lobe. Pneumonia cannot be excluded. Clinical correlation is necessary. Hepatobiliary: Mild fatty infiltration of the liver. No intrahepatic biliary ductal dilatation. Small calcified gallstone within gallbladder measures 3.5 mm. Pancreas: Mild atrophic pancreas. Spleen: Spleen is unremarkable. Adrenals/Urinary Tract: No adrenal gland mass. Kidneys are symmetrical in size and enhancement. No hydronephrosis or hydroureter. Urinary bladder is under distended grossly unremarkable. Delayed renal images shows bilateral renal symmetrical excretion. Bilateral visualized proximal ureter is unremarkable. Stomach/Bowel: There is no gastric outlet obstruction. Mild dilated proximal small bowel loops with contrast material in upper abdomen and left mid abdomen. Some postsurgical changes are noted mid small bowel anterior abdomen axial image 60. No any contrast material is noted in distal  small bowel loops or within colon. There are postsurgical changes in small bowel with side-to-side anastomosis in right lower quadrant please see axial image 54. Coronal image 61 there is mild dilatation of the small bowel just above the anastomosis with some fecal like material. Distal small bowel in right lower quadrant is smaller caliber. Findings are highly suspicious for at least partial small bowel obstruction at the anastomosis site in right lower quadrant. No any contrast material noted within colon or terminal small bowel. There is a low lying cecum. Moderate stool noted within cecum and right colon. No pericecal inflammation. Surgical clips are noted within lower abdomen and pelvis. Vascular/Lymphatic: Atherosclerotic calcifications of abdominal aorta and iliac arteries are noted. No aortic aneurysm. No retroperitoneal or mesenteric adenopathy. Reproductive: No adnexal masses noted. The uterus is anteflexed no pelvic free fluid. Other: There is scarring in right lower quadrant anterior abdominal wall from previous colostomy. Some skin thickening, scarring or cellulitis noted lower anterior abdominal wall. There is scarring in right paraumbilical region anterior abdominal wall. No ascites or free abdominal air. Musculoskeletal: Sagittal images of the spine shows degenerative changes thoracolumbar spine. No destructive bony lesions are noted within pelvis. IMPRESSION: 1. There are postsurgical changes in small bowel with side-to-side anastomosis in right lower quadrant please see axial image 54. Coronal image 61 there is mild dilatation of the small bowel just above the anastomosis with some fecal like material. Distal small bowel in right lower quadrant is smaller caliber. Findings are highly suspicious for at least partial small bowel obstruction at the anastomosis site in right lower quadrant. No any contrast material is noted within distal small bowel or within colon. 2. There is a low lying cecum. Some  colonic stool noted within cecum. No pericecal inflammation. 3. No hydronephrosis or hydroureter. 4. Mild fatty infiltration of the liver. 5. Small calcified gallstone within gallbladder measures 3.5 mm. 6. There is patchy central lung infiltrate in right middle lobe. Patchy central infiltrate noted in left lower lobe. Multifocal pneumonia cannot be excluded. Clinical correlation is necessary. Electronically Signed   By: Lahoma Crocker M.D.   On: 06/07/2016 11:30   Dg Abd Portable 1v  06/08/2016  CLINICAL DATA:  Small bowel obstruction EXAM: PORTABLE ABDOMEN - 1 VIEW COMPARISON:  CT abdomen and pelvis June 07, 2016 FINDINGS: There is contrast in portions of the colon. There is no appreciable bowel dilatation or air-fluid level suggesting obstruction. No free air is evident on this supine examination. There are multiple clips throughout the pelvis. IMPRESSION: Oral contrast is seen in the colon. The bowel gas pattern overall is considered unremarkable. No bowel obstruction or free air is evident on this current examination. Electronically Signed   By: Lowella Grip III M.D.   On: 06/08/2016  10:28    2D ECHO:   Disposition and Follow-up: Discharge Instructions    Discharge instructions    Complete by:  As directed   Please continue FULL LIQUID diet for next couple of days and advance as tolerated     Increase activity slowly    Complete by:  As directed             DISPOSITION:Home   DISCHARGE FOLLOW-UP Follow-up Information    Follow up with Aretta Nip, MD On 06/20/2016.   Specialty:  Family Medicine   Why:  for hospital follow-up///Appointment with Dr. Radene Ou is on 06/20/16 at 11:30am   Contact information:   [place] [address] [phone]        Time spent on Discharge: 42mins   Signed:   RAI,RIPUDEEP M.D. [facility] 06/08/2016, 2:33 PM Pager: [phone]

## 2016-06-09 LAB — HEMOGLOBIN A1C
Hgb A1c MFr Bld: 5.9 % — ABNORMAL HIGH (ref 4.8–5.6)
Mean Plasma Glucose: 123 mg/dL

## 2016-06-19 DIAGNOSIS — R748 Abnormal levels of other serum enzymes: Secondary | ICD-10-CM | POA: Diagnosis not present

## 2016-06-19 DIAGNOSIS — Z794 Long term (current) use of insulin: Secondary | ICD-10-CM | POA: Diagnosis not present

## 2016-06-19 DIAGNOSIS — Z6841 Body Mass Index (BMI) 40.0 and over, adult: Secondary | ICD-10-CM | POA: Diagnosis not present

## 2016-06-19 DIAGNOSIS — E119 Type 2 diabetes mellitus without complications: Secondary | ICD-10-CM | POA: Diagnosis not present

## 2016-06-20 DIAGNOSIS — M109 Gout, unspecified: Secondary | ICD-10-CM | POA: Diagnosis not present

## 2016-06-20 DIAGNOSIS — K5669 Other intestinal obstruction: Secondary | ICD-10-CM | POA: Diagnosis not present

## 2016-08-13 DIAGNOSIS — L720 Epidermal cyst: Secondary | ICD-10-CM | POA: Diagnosis not present

## 2016-08-13 DIAGNOSIS — D2272 Melanocytic nevi of left lower limb, including hip: Secondary | ICD-10-CM | POA: Diagnosis not present

## 2016-08-13 DIAGNOSIS — L821 Other seborrheic keratosis: Secondary | ICD-10-CM | POA: Diagnosis not present

## 2016-08-13 DIAGNOSIS — D225 Melanocytic nevi of trunk: Secondary | ICD-10-CM | POA: Diagnosis not present

## 2016-08-13 DIAGNOSIS — L738 Other specified follicular disorders: Secondary | ICD-10-CM | POA: Diagnosis not present

## 2016-08-13 DIAGNOSIS — L570 Actinic keratosis: Secondary | ICD-10-CM | POA: Diagnosis not present

## 2016-08-13 DIAGNOSIS — D2271 Melanocytic nevi of right lower limb, including hip: Secondary | ICD-10-CM | POA: Diagnosis not present

## 2016-08-13 DIAGNOSIS — D2239 Melanocytic nevi of other parts of face: Secondary | ICD-10-CM | POA: Diagnosis not present

## 2016-08-13 DIAGNOSIS — D2262 Melanocytic nevi of left upper limb, including shoulder: Secondary | ICD-10-CM | POA: Diagnosis not present

## 2016-08-13 DIAGNOSIS — L814 Other melanin hyperpigmentation: Secondary | ICD-10-CM | POA: Diagnosis not present

## 2016-08-13 DIAGNOSIS — D1801 Hemangioma of skin and subcutaneous tissue: Secondary | ICD-10-CM | POA: Diagnosis not present

## 2016-08-13 DIAGNOSIS — D2261 Melanocytic nevi of right upper limb, including shoulder: Secondary | ICD-10-CM | POA: Diagnosis not present

## 2016-10-19 DIAGNOSIS — Z23 Encounter for immunization: Secondary | ICD-10-CM | POA: Diagnosis not present

## 2016-10-29 IMAGING — CT CT ABD-PELV W/ CM
2 of 5 series · 8 of 46 positions shown, 9 images · IV contrast (Iodine)
Comparison: None.

CLINICAL DATA: Abdominal pain and burping starting [REDACTED], history
of colostomy and reversal, history of small bowel obstruction.

EXAM:
CT ABDOMEN AND PELVIS WITH CONTRAST
TECHNIQUE: Multidetector CT imaging of the abdomen and pelvis was performed
using the standard protocol following bolus administration of
intravenous contrast.
CONTRAST:  100mL LXFGUJ-T33 IOPAMIDOL (LXFGUJ-T33) INJECTION 61%

[Series 201: routine, idose (2) · axial · 0.84mm/px · z∈[+79,+454]mm · 5 of 95 slices shown, 6 images]
[im 10/95  soft-tissue]
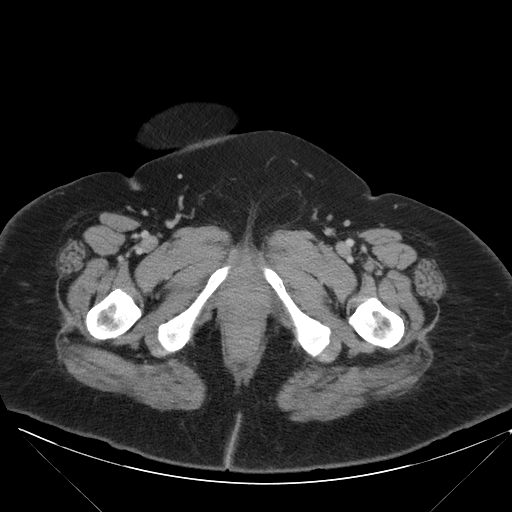
[im 10/95  bone]
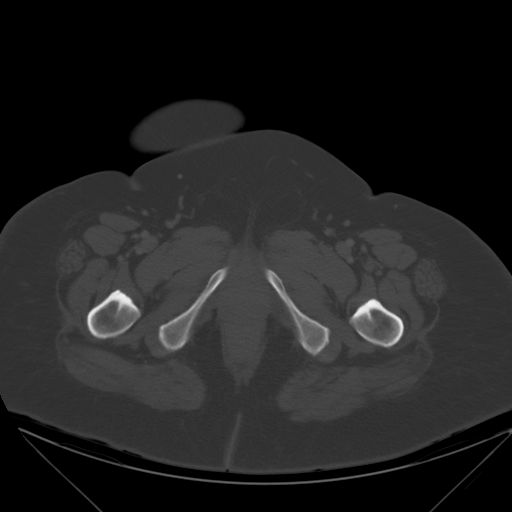
[im 30/95  soft-tissue]
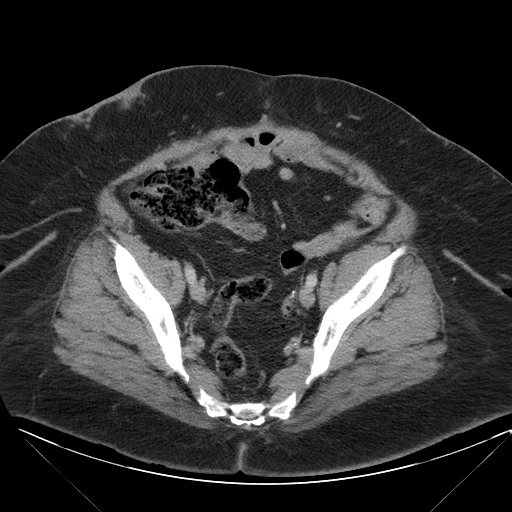
[im 50/95  soft-tissue]
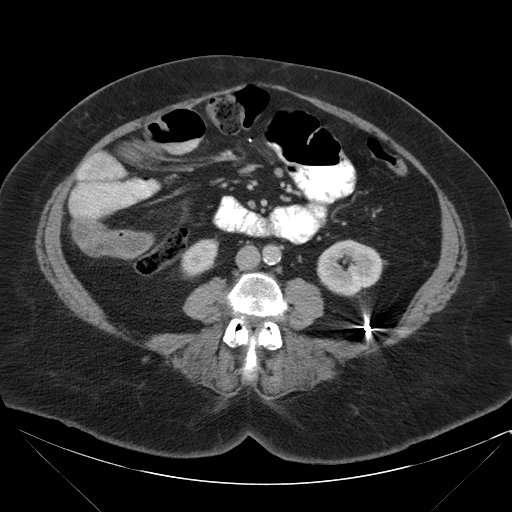
[im 65/95  soft-tissue]
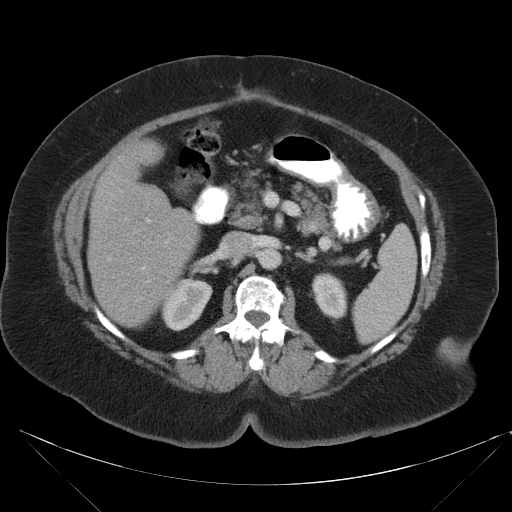
[im 85/95  soft-tissue]
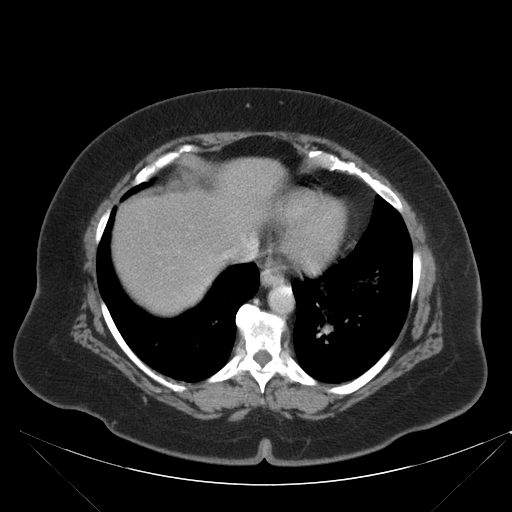

[Series 203: coronals, idose (2) · coronal · 0.45mm/px · 3 of 156 slices shown]
[im 52/156  soft-tissue]
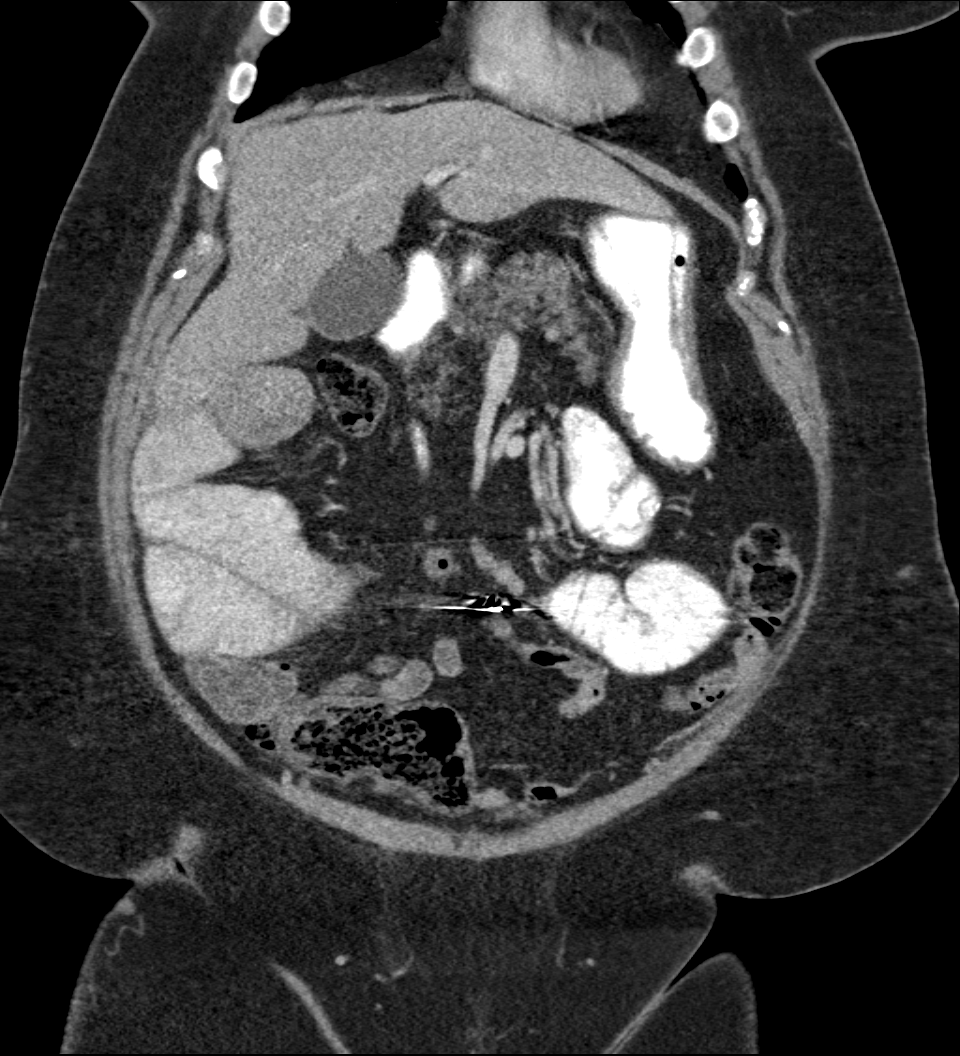
[im 69/156  soft-tissue]
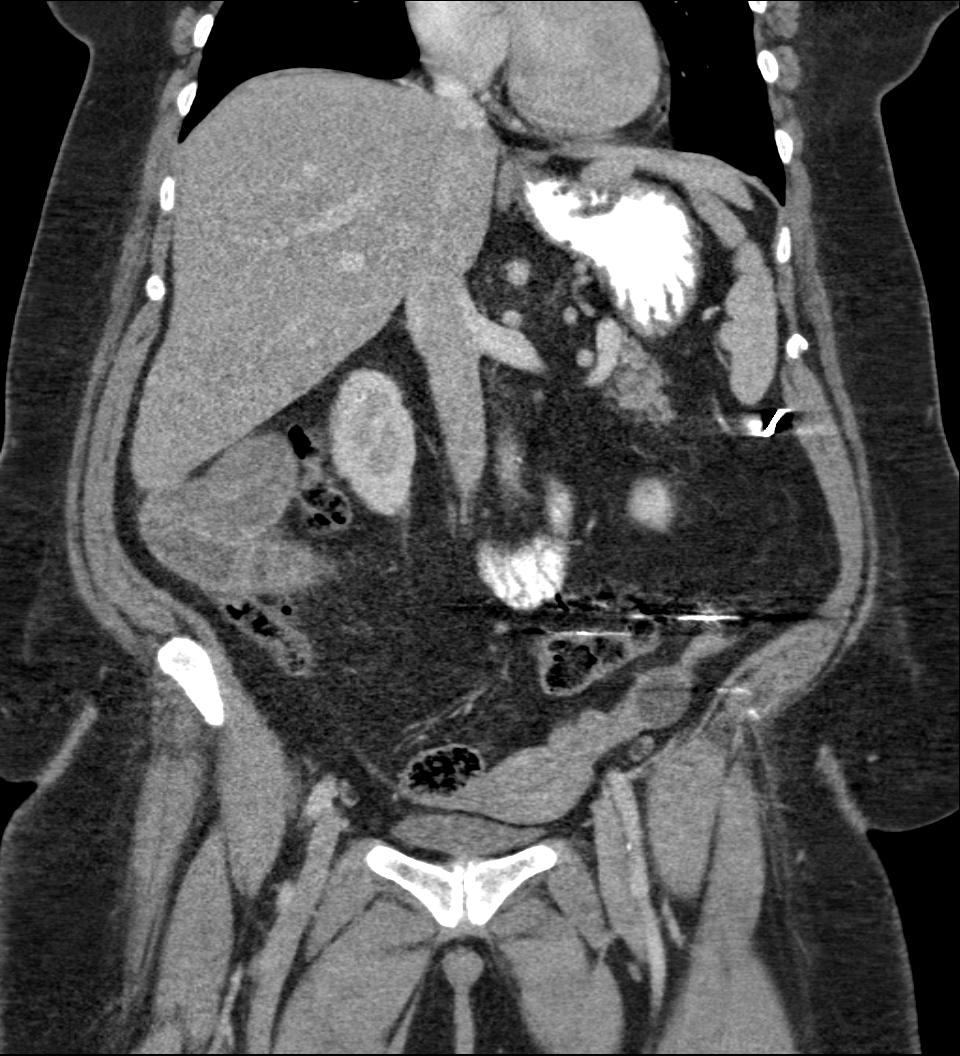
[im 87/156  soft-tissue]
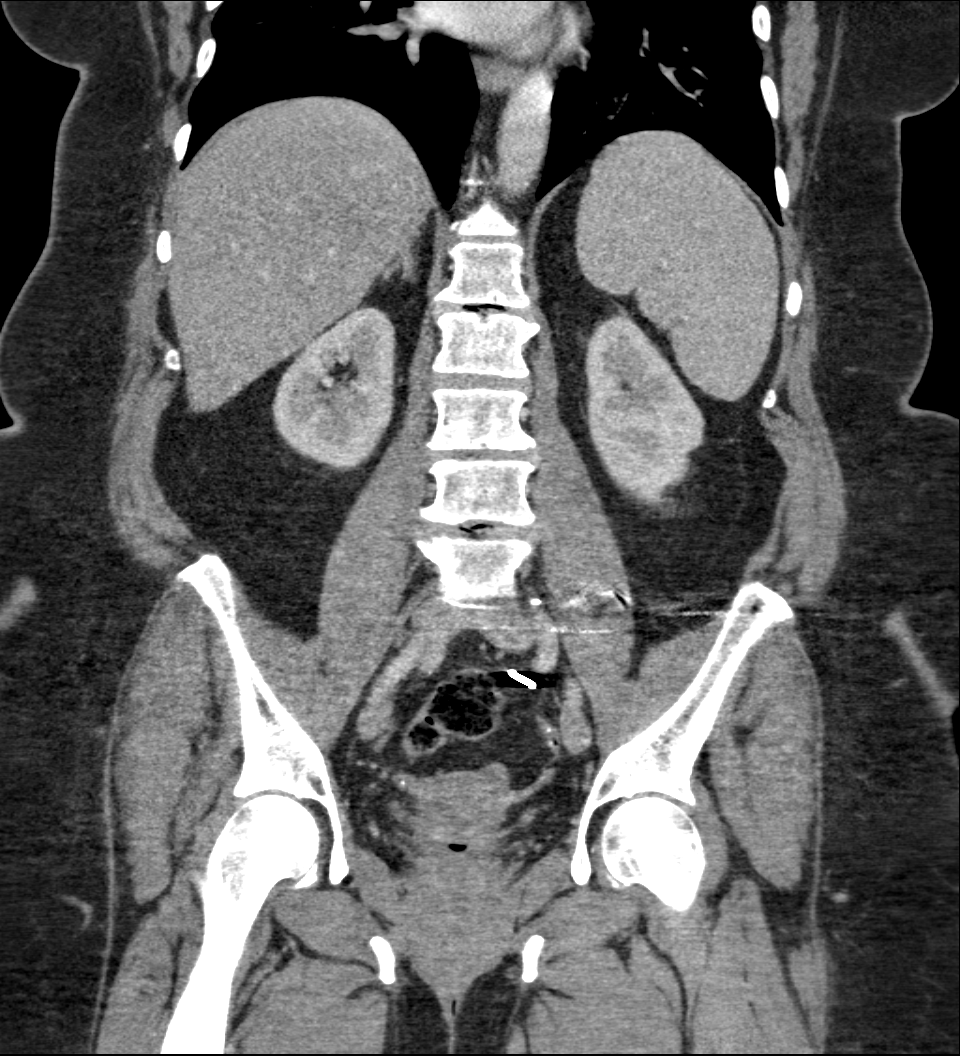

[8 of 46 positions shown; findings below may reference images not displayed]

FINDINGS: Lower chest: There is patchy infiltrate in right middle lobe. Patchy
central infiltrate is noted in left lower lobe. Pneumonia cannot be
excluded. Clinical correlation is necessary.

Hepatobiliary: Mild fatty infiltration of the liver. No intrahepatic
biliary ductal dilatation. Small calcified gallstone within
gallbladder measures 3.5 mm.

Pancreas: Mild atrophic pancreas.

Spleen: Spleen is unremarkable.

Adrenals/Urinary Tract: No adrenal gland mass. Kidneys are
symmetrical in size and enhancement. No hydronephrosis or
hydroureter. Urinary bladder is under distended grossly
unremarkable. Delayed renal images shows bilateral renal symmetrical
excretion. Bilateral visualized proximal ureter is unremarkable.

Stomach/Bowel: There is no gastric outlet obstruction. Mild dilated
proximal small bowel loops with contrast material in upper abdomen
and left mid abdomen. Some postsurgical changes are noted mid small
bowel anterior abdomen axial image 60.

No any contrast material is noted in distal small bowel loops or
within colon.

There are postsurgical changes in small bowel with side-to-side
anastomosis in right lower quadrant please see axial image 54.
Coronal image 61 there is mild dilatation of the small bowel just
above the anastomosis with some fecal like material. Distal small
bowel in right lower quadrant is smaller caliber. Findings are
highly suspicious for at least partial small bowel obstruction at
the anastomosis site in right lower quadrant.

No any contrast material noted within colon or terminal small bowel.

There is a low lying cecum. Moderate stool noted within cecum and
right colon. No pericecal inflammation. Surgical clips are noted
within lower abdomen and pelvis.

Vascular/Lymphatic: Atherosclerotic calcifications of abdominal
aorta and iliac arteries are noted. No aortic aneurysm. No
retroperitoneal or mesenteric adenopathy.

Reproductive: No adnexal masses noted. The uterus is anteflexed no
pelvic free fluid.

Other: There is scarring in right lower quadrant anterior abdominal
wall from previous colostomy. Some skin thickening, scarring or
cellulitis noted lower anterior abdominal wall. There is scarring in
right paraumbilical region anterior abdominal wall. No ascites or
free abdominal air.

Musculoskeletal: Sagittal images of the spine shows degenerative
changes thoracolumbar spine. No destructive bony lesions are noted
within pelvis.
IMPRESSION: 1. There are postsurgical changes in small bowel with side-to-side
anastomosis in right lower quadrant please see axial image 54.
Coronal image 61 there is mild dilatation of the small bowel just
above the anastomosis with some fecal like material. Distal small
bowel in right lower quadrant is smaller caliber. Findings are
highly suspicious for at least partial small bowel obstruction at
the anastomosis site in right lower quadrant. No any contrast
material is noted within distal small bowel or within colon.
2. There is a low lying cecum. Some colonic stool noted within
cecum. No pericecal inflammation.
3. No hydronephrosis or hydroureter.
4. Mild fatty infiltration of the liver.
5. Small calcified gallstone within gallbladder measures 3.5 mm.
6. There is patchy central lung infiltrate in right middle lobe.
Patchy central infiltrate noted in left lower lobe. Multifocal
pneumonia cannot be excluded. Clinical correlation is necessary.

## 2016-11-20 DIAGNOSIS — H43811 Vitreous degeneration, right eye: Secondary | ICD-10-CM | POA: Diagnosis not present

## 2016-11-20 DIAGNOSIS — H2513 Age-related nuclear cataract, bilateral: Secondary | ICD-10-CM | POA: Diagnosis not present

## 2016-11-20 DIAGNOSIS — E119 Type 2 diabetes mellitus without complications: Secondary | ICD-10-CM | POA: Diagnosis not present

## 2016-11-20 DIAGNOSIS — H5213 Myopia, bilateral: Secondary | ICD-10-CM | POA: Diagnosis not present

## 2016-12-18 DIAGNOSIS — R748 Abnormal levels of other serum enzymes: Secondary | ICD-10-CM | POA: Diagnosis not present

## 2016-12-18 DIAGNOSIS — Z6841 Body Mass Index (BMI) 40.0 and over, adult: Secondary | ICD-10-CM | POA: Diagnosis not present

## 2016-12-18 DIAGNOSIS — E119 Type 2 diabetes mellitus without complications: Secondary | ICD-10-CM | POA: Diagnosis not present

## 2016-12-18 DIAGNOSIS — Z794 Long term (current) use of insulin: Secondary | ICD-10-CM | POA: Diagnosis not present

## 2017-06-06 DIAGNOSIS — Z6841 Body Mass Index (BMI) 40.0 and over, adult: Secondary | ICD-10-CM | POA: Diagnosis not present

## 2017-06-06 DIAGNOSIS — Z1231 Encounter for screening mammogram for malignant neoplasm of breast: Secondary | ICD-10-CM | POA: Diagnosis not present

## 2017-06-06 DIAGNOSIS — Z01419 Encounter for gynecological examination (general) (routine) without abnormal findings: Secondary | ICD-10-CM | POA: Diagnosis not present

## 2017-06-19 DIAGNOSIS — E119 Type 2 diabetes mellitus without complications: Secondary | ICD-10-CM | POA: Diagnosis not present

## 2017-06-19 DIAGNOSIS — Z794 Long term (current) use of insulin: Secondary | ICD-10-CM | POA: Diagnosis not present

## 2017-06-19 DIAGNOSIS — Z6841 Body Mass Index (BMI) 40.0 and over, adult: Secondary | ICD-10-CM | POA: Diagnosis not present

## 2017-08-15 DIAGNOSIS — D225 Melanocytic nevi of trunk: Secondary | ICD-10-CM | POA: Diagnosis not present

## 2017-08-15 DIAGNOSIS — D692 Other nonthrombocytopenic purpura: Secondary | ICD-10-CM | POA: Diagnosis not present

## 2017-08-15 DIAGNOSIS — D2272 Melanocytic nevi of left lower limb, including hip: Secondary | ICD-10-CM | POA: Diagnosis not present

## 2017-08-15 DIAGNOSIS — L821 Other seborrheic keratosis: Secondary | ICD-10-CM | POA: Diagnosis not present

## 2017-08-15 DIAGNOSIS — L814 Other melanin hyperpigmentation: Secondary | ICD-10-CM | POA: Diagnosis not present

## 2017-08-15 DIAGNOSIS — L738 Other specified follicular disorders: Secondary | ICD-10-CM | POA: Diagnosis not present

## 2017-08-15 DIAGNOSIS — D2271 Melanocytic nevi of right lower limb, including hip: Secondary | ICD-10-CM | POA: Diagnosis not present

## 2017-08-15 DIAGNOSIS — D1801 Hemangioma of skin and subcutaneous tissue: Secondary | ICD-10-CM | POA: Diagnosis not present

## 2017-10-22 DIAGNOSIS — I10 Essential (primary) hypertension: Secondary | ICD-10-CM | POA: Diagnosis not present

## 2017-10-22 DIAGNOSIS — Z23 Encounter for immunization: Secondary | ICD-10-CM | POA: Diagnosis not present

## 2017-10-22 DIAGNOSIS — H8110 Benign paroxysmal vertigo, unspecified ear: Secondary | ICD-10-CM | POA: Diagnosis not present

## 2017-10-22 DIAGNOSIS — M109 Gout, unspecified: Secondary | ICD-10-CM | POA: Diagnosis not present

## 2017-11-20 DIAGNOSIS — H524 Presbyopia: Secondary | ICD-10-CM | POA: Diagnosis not present

## 2017-11-20 DIAGNOSIS — H2513 Age-related nuclear cataract, bilateral: Secondary | ICD-10-CM | POA: Diagnosis not present

## 2017-11-20 DIAGNOSIS — H25013 Cortical age-related cataract, bilateral: Secondary | ICD-10-CM | POA: Diagnosis not present

## 2017-12-11 DIAGNOSIS — Z794 Long term (current) use of insulin: Secondary | ICD-10-CM | POA: Diagnosis not present

## 2017-12-11 DIAGNOSIS — Z6841 Body Mass Index (BMI) 40.0 and over, adult: Secondary | ICD-10-CM | POA: Diagnosis not present

## 2017-12-11 DIAGNOSIS — E119 Type 2 diabetes mellitus without complications: Secondary | ICD-10-CM | POA: Diagnosis not present

## 2018-04-04 DIAGNOSIS — Z6841 Body Mass Index (BMI) 40.0 and over, adult: Secondary | ICD-10-CM | POA: Diagnosis not present

## 2018-04-04 DIAGNOSIS — E119 Type 2 diabetes mellitus without complications: Secondary | ICD-10-CM | POA: Diagnosis not present

## 2018-04-04 DIAGNOSIS — Z794 Long term (current) use of insulin: Secondary | ICD-10-CM | POA: Diagnosis not present

## 2018-07-18 DIAGNOSIS — Z794 Long term (current) use of insulin: Secondary | ICD-10-CM | POA: Diagnosis not present

## 2018-07-18 DIAGNOSIS — E119 Type 2 diabetes mellitus without complications: Secondary | ICD-10-CM | POA: Diagnosis not present

## 2018-08-13 DIAGNOSIS — D2261 Melanocytic nevi of right upper limb, including shoulder: Secondary | ICD-10-CM | POA: Diagnosis not present

## 2018-08-13 DIAGNOSIS — D2272 Melanocytic nevi of left lower limb, including hip: Secondary | ICD-10-CM | POA: Diagnosis not present

## 2018-08-13 DIAGNOSIS — Q828 Other specified congenital malformations of skin: Secondary | ICD-10-CM | POA: Diagnosis not present

## 2018-08-13 DIAGNOSIS — D2262 Melanocytic nevi of left upper limb, including shoulder: Secondary | ICD-10-CM | POA: Diagnosis not present

## 2018-08-13 DIAGNOSIS — L821 Other seborrheic keratosis: Secondary | ICD-10-CM | POA: Diagnosis not present

## 2018-08-13 DIAGNOSIS — D1801 Hemangioma of skin and subcutaneous tissue: Secondary | ICD-10-CM | POA: Diagnosis not present

## 2018-08-13 DIAGNOSIS — D2271 Melanocytic nevi of right lower limb, including hip: Secondary | ICD-10-CM | POA: Diagnosis not present

## 2018-09-25 DIAGNOSIS — Z1231 Encounter for screening mammogram for malignant neoplasm of breast: Secondary | ICD-10-CM | POA: Diagnosis not present

## 2018-09-25 DIAGNOSIS — N958 Other specified menopausal and perimenopausal disorders: Secondary | ICD-10-CM | POA: Diagnosis not present

## 2018-09-25 DIAGNOSIS — Z6841 Body Mass Index (BMI) 40.0 and over, adult: Secondary | ICD-10-CM | POA: Diagnosis not present

## 2018-09-25 DIAGNOSIS — Z01419 Encounter for gynecological examination (general) (routine) without abnormal findings: Secondary | ICD-10-CM | POA: Diagnosis not present

## 2018-10-06 DIAGNOSIS — R151 Fecal smearing: Secondary | ICD-10-CM | POA: Diagnosis not present

## 2018-10-06 DIAGNOSIS — Z23 Encounter for immunization: Secondary | ICD-10-CM | POA: Diagnosis not present

## 2018-10-06 DIAGNOSIS — M109 Gout, unspecified: Secondary | ICD-10-CM | POA: Diagnosis not present

## 2018-10-20 DIAGNOSIS — E119 Type 2 diabetes mellitus without complications: Secondary | ICD-10-CM | POA: Diagnosis not present

## 2018-11-25 DIAGNOSIS — E109 Type 1 diabetes mellitus without complications: Secondary | ICD-10-CM | POA: Diagnosis not present

## 2018-11-25 DIAGNOSIS — H524 Presbyopia: Secondary | ICD-10-CM | POA: Diagnosis not present

## 2018-11-25 DIAGNOSIS — H25013 Cortical age-related cataract, bilateral: Secondary | ICD-10-CM | POA: Diagnosis not present

## 2018-11-25 DIAGNOSIS — H2513 Age-related nuclear cataract, bilateral: Secondary | ICD-10-CM | POA: Diagnosis not present

## 2018-12-11 DIAGNOSIS — Z794 Long term (current) use of insulin: Secondary | ICD-10-CM | POA: Diagnosis not present

## 2018-12-11 DIAGNOSIS — E119 Type 2 diabetes mellitus without complications: Secondary | ICD-10-CM | POA: Diagnosis not present

## 2019-01-02 DIAGNOSIS — N611 Abscess of the breast and nipple: Secondary | ICD-10-CM | POA: Diagnosis not present

## 2019-01-05 DIAGNOSIS — N611 Abscess of the breast and nipple: Secondary | ICD-10-CM | POA: Diagnosis not present

## 2019-06-02 DIAGNOSIS — E119 Type 2 diabetes mellitus without complications: Secondary | ICD-10-CM | POA: Diagnosis not present

## 2019-06-02 DIAGNOSIS — Z794 Long term (current) use of insulin: Secondary | ICD-10-CM | POA: Diagnosis not present

## 2019-06-11 DIAGNOSIS — E119 Type 2 diabetes mellitus without complications: Secondary | ICD-10-CM | POA: Diagnosis not present

## 2019-06-12 ENCOUNTER — Other Ambulatory Visit: Payer: Self-pay

## 2019-06-12 ENCOUNTER — Emergency Department (HOSPITAL_COMMUNITY)
Admission: EM | Admit: 2019-06-12 | Discharge: 2019-06-13 | Disposition: A | Discharge status: Home / Self Care | Payer: Medicare Other | Attending: Emergency Medicine | Admitting: Emergency Medicine

## 2019-06-12 ENCOUNTER — Encounter (HOSPITAL_COMMUNITY): Payer: Self-pay | Admitting: Emergency Medicine

## 2019-06-12 DIAGNOSIS — Z87891 Personal history of nicotine dependence: Secondary | ICD-10-CM | POA: Insufficient documentation

## 2019-06-12 DIAGNOSIS — E11649 Type 2 diabetes mellitus with hypoglycemia without coma: Secondary | ICD-10-CM | POA: Diagnosis not present

## 2019-06-12 DIAGNOSIS — E162 Hypoglycemia, unspecified: Secondary | ICD-10-CM | POA: Diagnosis not present

## 2019-06-12 DIAGNOSIS — N938 Other specified abnormal uterine and vaginal bleeding: Secondary | ICD-10-CM | POA: Insufficient documentation

## 2019-06-12 DIAGNOSIS — Z79899 Other long term (current) drug therapy: Secondary | ICD-10-CM | POA: Insufficient documentation

## 2019-06-12 DIAGNOSIS — Z794 Long term (current) use of insulin: Secondary | ICD-10-CM | POA: Insufficient documentation

## 2019-06-12 DIAGNOSIS — R55 Syncope and collapse: Secondary | ICD-10-CM | POA: Diagnosis not present

## 2019-06-12 DIAGNOSIS — R4020 Unspecified coma: Secondary | ICD-10-CM | POA: Diagnosis not present

## 2019-06-12 DIAGNOSIS — I10 Essential (primary) hypertension: Secondary | ICD-10-CM | POA: Insufficient documentation

## 2019-06-12 DIAGNOSIS — N939 Abnormal uterine and vaginal bleeding, unspecified: Secondary | ICD-10-CM | POA: Diagnosis not present

## 2019-06-12 DIAGNOSIS — Z85820 Personal history of malignant melanoma of skin: Secondary | ICD-10-CM | POA: Diagnosis not present

## 2019-06-12 DIAGNOSIS — R404 Transient alteration of awareness: Secondary | ICD-10-CM | POA: Diagnosis not present

## 2019-06-12 DIAGNOSIS — E161 Other hypoglycemia: Secondary | ICD-10-CM | POA: Diagnosis not present

## 2019-06-12 LAB — CBG MONITORING, ED: Glucose-Capillary: 143 mg/dL — ABNORMAL HIGH (ref 70–99)

## 2019-06-12 NOTE — ED Triage Notes (Signed)
[facility] transported pt from home and reports the following:  Pt husband said pt was alert, looking around, but would not talk to him.  At 22:36 BG 43 Attempted IV access, unable to obtain. Gave 1mg  glucagon IM.  Pt ate 24g tube of glucose.  At 23:17 BG 79 No complaints of pain. Pt doesn't remember EMS arrival at her home.

## 2019-06-12 NOTE — ED Notes (Signed)
Bed: WA09 Expected date:  Expected time:  Means of arrival:  Comments: EMS SYNCOPE HYPOGLYCEMIA /[place]-glucagon and oral glucose-unable to establish IV

## 2019-06-13 DIAGNOSIS — E11649 Type 2 diabetes mellitus with hypoglycemia without coma: Secondary | ICD-10-CM | POA: Diagnosis not present

## 2019-06-13 LAB — BASIC METABOLIC PANEL
Anion gap: 10 (ref 5–15)
BUN: 35 mg/dL — ABNORMAL HIGH (ref 8–23)
CO2: 23 mmol/L (ref 22–32)
Calcium: 9.5 mg/dL (ref 8.9–10.3)
Chloride: 106 mmol/L (ref 98–111)
Creatinine, Ser: 1.09 mg/dL — ABNORMAL HIGH (ref 0.44–1.00)
GFR calc Af Amer: 60 mL/min — ABNORMAL LOW (ref >60)
GFR calc non Af Amer: 51 mL/min — ABNORMAL LOW (ref >60)
Glucose, Bld: 154 mg/dL — ABNORMAL HIGH (ref 70–99)
Potassium: 4 mmol/L (ref 3.5–5.1)
Sodium: 139 mmol/L (ref 135–145)

## 2019-06-13 LAB — CBC WITH DIFFERENTIAL/PLATELET
Abs Immature Granulocytes: 0.07 10*3/uL (ref 0.00–0.07)
Basophils Absolute: 0 10*3/uL (ref 0.0–0.1)
Basophils Relative: 0 %
Eosinophils Absolute: 0 10*3/uL (ref 0.0–0.5)
Eosinophils Relative: 0 %
HCT: 39 % (ref 36.0–46.0)
Hemoglobin: 12.6 g/dL (ref 12.0–15.0)
Immature Granulocytes: 0 %
Lymphocytes Relative: 6 %
Lymphs Abs: 1 10*3/uL (ref 0.7–4.0)
MCH: 30.8 pg (ref 26.0–34.0)
MCHC: 32.3 g/dL (ref 30.0–36.0)
MCV: 95.4 fL (ref 80.0–100.0)
Monocytes Absolute: 0.8 10*3/uL (ref 0.1–1.0)
Monocytes Relative: 5 %
Neutro Abs: 14.1 10*3/uL — ABNORMAL HIGH (ref 1.7–7.7)
Neutrophils Relative %: 89 %
Platelets: 211 10*3/uL (ref 150–400)
RBC: 4.09 MIL/uL (ref 3.87–5.11)
RDW: 14.4 % (ref 11.5–15.5)
WBC: 16 10*3/uL — ABNORMAL HIGH (ref 4.0–10.5)
nRBC: 0 % (ref 0.0–0.2)

## 2019-06-13 LAB — CBG MONITORING, ED
Glucose-Capillary: 124 mg/dL — ABNORMAL HIGH (ref 70–99)
Glucose-Capillary: 170 mg/dL — ABNORMAL HIGH (ref 70–99)

## 2019-06-13 MED ORDER — MEGESTROL ACETATE 20 MG PO TABS
20.0000 mg | ORAL_TABLET | Freq: Every day | ORAL | Status: DC
  Administered 2019-06-13: 20 mg via ORAL
  Filled 2019-06-13: qty 1

## 2019-06-13 MED ORDER — MEGESTROL ACETATE 20 MG PO TABS: 20.0000 mg | ORAL_TABLET | Freq: Every day | ORAL | 0 refills | Status: DC

## 2019-06-13 NOTE — ED Notes (Signed)
Unable to obtain temperature orally or axillary after multiple attempts.  Claiborne Billings, PA-C at bedside and witnessed same.

## 2019-06-13 NOTE — ED Notes (Signed)
Provided pt with warm blankets and warm drink

## 2019-06-13 NOTE — ED Notes (Signed)
Pt eating graham crackers and peanut butter. Drinking 8oz orange juice.

## 2019-06-13 NOTE — ED Notes (Signed)
Pt ate 4 packages of graham crackers (4g sugar per pack) and 4 peanut butters packages (each packet weighs 0.75 oz). Drank 8 oz tea with equal. Drank 8 oz orange juice.

## 2019-06-13 NOTE — ED Notes (Signed)
Pt was discharge at 2:54 AM. Upon discharge, she went to restroom, and returned with heavy vaginal bleeding. She requested to see the provider. Provider did a pelvic exam and discharged pt again.

## 2019-06-13 NOTE — ED Notes (Signed)
Francina Ames, pt's spouse to let him know pt discharged. He said he would pick her up. Driving from [employer].

## 2019-06-13 NOTE — Discharge Instructions (Addendum)
Please call the [facility] office on Monday at 8:30am to set up an appointment for that day. You will need an endometrial biopsy and and ultrasound Please start Megace once daily to help slow and stop the bleeding Call your OBGYN office or return to the emergency department if you are having severe bleeding (more than 1 pad an hour)

## 2019-06-13 NOTE — ED Provider Notes (Addendum)
[facility] DEPT Provider Note   CSN: [id number] Arrival date & time: 06/12/19  2339     History   Chief Complaint Chief Complaint  Patient presents with  . Hypoglycemia    HPI [name] is a 69 y.o. female who presents with hypoglycemia.  Past medical history significant for obesity, insulin-dependent diabetes, hypertension, hyperlipidemia.  The patient states that she has been dieting since June 1 and following a low-carb diet.  She has been decreasing her insulin as well.  She was taking 90 units and is down to 73 units twice daily.  Sometimes she gets stomach upset and becomes very gassy.  This happened after eating corn beef and cabbage earlier today for lunch.  She took her insulin before dinnertime but because her abdomen was so uncomfortable she did not eat.  Subsequently the patient was confused and had a possible syncopal episode.  EMS was called and blood glucose was noted to be 43.  IV access was not able to be established and she was given IM glucagon and 82 of glucose.  CBG here is 143.  Otherwise the patient has been in her usual state of health and has no complaints.  Her most recent A1c was 5.5.     HPI  Past Medical History:  Diagnosis Date  . Arthritis   . Cancer ([place])    skin CA  . Diabetes mellitus without complication ([place])   . Diverticulitis   . Hypertension     Patient Active Problem List   Diagnosis Date Noted  . SBO (small bowel obstruction) ([place]) 06/07/2016  . Lung infiltrate 06/07/2016  . Diabetes mellitus with complication ([place]) 42/59/5638  . HLD (hyperlipidemia) 06/07/2016  . Essential hypertension 06/07/2016  . Gout 06/07/2016    Past Surgical History:  Procedure Laterality Date  . ABDOMINAL SURGERY       OB History   No obstetric history on file.      Home Medications    Prior to Admission medications   Medication Sig Start Date End Date Taking? Authorizing Provider  allopurinol (ZYLOPRIM) 300 MG  tablet Take 300 mg by mouth daily.    [provider]  aspirin 325 MG tablet Take 325 mg by mouth daily.    [provider]  atorvastatin (LIPITOR) 40 MG tablet Take 40 mg by mouth daily.    [provider]  carboxymethylcellulose (REFRESH PLUS) 0.5 % SOLN Apply 1 drop to eye daily as needed (dry eyes).    [provider]  Coenzyme Q10 (CO Q 10 PO) Take 1 tablet by mouth daily.    [provider]  glucosamine-chondroitin 500-400 MG tablet Take 1 tablet by mouth 2 (two) times daily.    [provider]  ibuprofen (ADVIL,MOTRIN) 200 MG tablet Take 800 mg by mouth every 6 (six) hours as needed for mild pain.    [provider]  insulin detemir (LEVEMIR) 100 UNIT/ML injection Inject 90 Units into the skin 2 (two) times daily.    [provider]  KRILL OIL PO Take 1 capsule by mouth daily.    [provider]  Liraglutide (VICTOZA) 18 MG/3ML SOPN Inject 1.8 mg into the skin daily.    [provider]  losartan (COZAAR) 100 MG tablet Take 100 mg by mouth daily.    [provider]  Melatonin 5 MG TABS Take 10 mg by mouth at bedtime.    [provider]  Multiple Vitamins-Minerals (MULTIVITAMIN WITH MINERALS) tablet Take  1 tablet by mouth daily.    [provider]  nystatin-triamcinolone ointment (MYCOLOG) Apply 1 application topically daily as needed (itching).    [provider]  polyethylene glycol (MIRALAX / GLYCOLAX) packet Take 17 g by mouth daily as needed for mild constipation or moderate constipation. 06/08/16   Rai, Ripudeep K, MD  senna-docusate (SENOKOT-S) 8.6-50 MG tablet Take 1 tablet by mouth 2 (two) times daily. For constipation 06/08/16   Rai, Vernelle Emerald, MD  traMADol (ULTRAM) 50 MG tablet Take 1 tablet (50 mg total) by mouth every 8 (eight) hours as needed for severe pain. 06/08/16   Mendel Corning, MD    Family History Family History  Problem Relation Age of Onset  .  Diabetes Sister   . Skin cancer Father     Social History Social History   Tobacco Use  . Smoking status: Former Smoker    Quit date: 06/08/1987    Years since quitting: 32.0  . Smokeless tobacco: Never Used  Substance Use Topics  . Alcohol use: Yes    Comment: social  . Drug use: No     Allergies   Lisinopril and Metformin and related   Review of Systems Review of Systems  Constitutional: Negative for fever.  Respiratory: Negative for shortness of breath.   Cardiovascular: Negative for chest pain.  Gastrointestinal: Negative for abdominal pain.  Endocrine:       +hypoglycemia  Genitourinary: Negative for dysuria.  Allergic/Immunologic: Positive for immunocompromised state.  All other systems reviewed and are negative.   Physical Exam Updated Vital Signs BP (!) 149/79   Pulse 86   Resp 18   Ht 5\' 5"  (1.651 m)   Wt 129.3 kg   SpO2 95%   BMI 47.43 kg/m   Physical Exam Vitals signs and nursing note reviewed.  Constitutional:      General: She is not in acute distress.    Appearance: She is well-developed. She is obese. She is not ill-appearing.     Comments: Calm and conversant  HENT:     Head: Normocephalic and atraumatic.  Eyes:     General: No scleral icterus.       Right eye: No discharge.        Left eye: No discharge.     Conjunctiva/sclera: Conjunctivae normal.     Pupils: Pupils are equal, round, and reactive to light.  Neck:     Musculoskeletal: Normal range of motion.  Cardiovascular:     Rate and Rhythm: Normal rate and regular rhythm.  Pulmonary:     Effort: Pulmonary effort is normal. No respiratory distress.     Breath sounds: Normal breath sounds.  Abdominal:     General: There is no distension.     Palpations: Abdomen is soft.     Tenderness: There is no abdominal tenderness.  Skin:    General: Skin is warm and dry.  Neurological:     Mental Status: She is alert and oriented to person, place, and time.  Psychiatric:         Behavior: Behavior normal.      ED Treatments / Results  Labs (all labs ordered are listed, but only abnormal results are displayed) Labs Reviewed  BASIC METABOLIC PANEL - Abnormal; Notable for the following components:      Result Value   Glucose, Bld 154 (*)    BUN 35 (*)    Creatinine, Ser 1.09 (*)    GFR calc non Af Amer 51 (*)  GFR calc Af Amer 60 (*)    All other components within normal limits  CBC WITH DIFFERENTIAL/PLATELET - Abnormal; Notable for the following components:   WBC 16.0 (*)    Neutro Abs 14.1 (*)    All other components within normal limits  CBG MONITORING, ED - Abnormal; Notable for the following components:   Glucose-Capillary 143 (*)    All other components within normal limits  CBG MONITORING, ED - Abnormal; Notable for the following components:   Glucose-Capillary 124 (*)    All other components within normal limits  CBG MONITORING, ED - Abnormal; Notable for the following components:   Glucose-Capillary 170 (*)    All other components within normal limits    EKG None  Radiology No results found.  Procedures Procedures (including critical care time)  Medications Ordered in ED Medications - No data to display   Initial Impression / Assessment and Plan / ED Course  I have reviewed the triage vital signs and the nursing notes.  Pertinent labs & imaging results that were available during my care of the patient were reviewed by me and considered in my medical decision making (see chart for details).  69 year old female presents with episode of hypoglycemia in the setting of recent dieting and missing a meal this evening.  She is hypertensive and hypothermic.  This is likely from hypoglycemia earlier tonight.  Initial CBG here is 143.  We will give her something to eat.  Her exam is overall unremarkable.  She is alert and conversant.  Will monitor for several more hours and obtain basic labs.  Shared visit with Dr. Betsey Holiday.  1AM Repeat CBG is  124. Pt is eating now  2AM CBG is 170.  CBC is remarkable for leukocytosis of 16 which is likely reactive.  BMP is remarkable for mild AKI.  Since glucose has remained stable will discharge at this time.  Return precautions given.  3:37 AM Notified by nursing that as pt was waiting for her husband to pick her up she went to the bathroom and had a large amt of vaginal bleeding. She states she has a hx of fibroids and has had a D&C in the past. She denies she's ever had an endometrial biopsy. Will perform pelvic  Pelvic is remarkable for vaginal bleeding that is like a heavy period but there is no hemorrhage or clots. Will discuss with OBGYN. She goes to [facility].   Discussed with Dr. Helane Rima. She does not recommend [place] emergently. She does recommend pt to come to the office on Monday and to start Megace 20mg  qd. Return precautions discussed.   Final Clinical Impressions(s) / ED Diagnoses   Final diagnoses:  Hypoglycemia  Vaginal bleeding    ED Discharge Orders    None          Recardo Evangelist, PA-C 06/13/19 0536    Orpah Greek, MD 06/13/19 [phone]

## 2019-06-24 DIAGNOSIS — N950 Postmenopausal bleeding: Secondary | ICD-10-CM | POA: Diagnosis not present

## 2019-06-24 DIAGNOSIS — N840 Polyp of corpus uteri: Secondary | ICD-10-CM | POA: Diagnosis not present

## 2019-06-29 DIAGNOSIS — E119 Type 2 diabetes mellitus without complications: Secondary | ICD-10-CM | POA: Diagnosis not present

## 2019-06-29 DIAGNOSIS — Z794 Long term (current) use of insulin: Secondary | ICD-10-CM | POA: Diagnosis not present

## 2019-07-09 ENCOUNTER — Encounter (HOSPITAL_BASED_OUTPATIENT_CLINIC_OR_DEPARTMENT_OTHER): Payer: Self-pay | Admitting: *Deleted

## 2019-07-09 ENCOUNTER — Other Ambulatory Visit: Payer: Self-pay

## 2019-07-14 ENCOUNTER — Other Ambulatory Visit: Payer: Self-pay

## 2019-07-14 ENCOUNTER — Encounter (HOSPITAL_BASED_OUTPATIENT_CLINIC_OR_DEPARTMENT_OTHER)
Admission: RE | Admit: 2019-07-14 | Discharge: 2019-07-14 | Disposition: A | Discharge status: Home / Self Care | Payer: Medicare Other | Source: Ambulatory Visit | Attending: Obstetrics and Gynecology | Admitting: Obstetrics and Gynecology

## 2019-07-14 ENCOUNTER — Other Ambulatory Visit (HOSPITAL_COMMUNITY)
Admission: RE | Admit: 2019-07-14 | Discharge: 2019-07-14 | Disposition: A | Discharge status: Home / Self Care | Payer: Medicare Other | Source: Ambulatory Visit | Attending: Obstetrics and Gynecology | Admitting: Obstetrics and Gynecology

## 2019-07-14 DIAGNOSIS — Z1159 Encounter for screening for other viral diseases: Secondary | ICD-10-CM | POA: Insufficient documentation

## 2019-07-14 DIAGNOSIS — N950 Postmenopausal bleeding: Secondary | ICD-10-CM | POA: Diagnosis not present

## 2019-07-14 LAB — CBC
HCT: 34.8 % — ABNORMAL LOW (ref 36.0–46.0)
Hemoglobin: 11.7 g/dL — ABNORMAL LOW (ref 12.0–15.0)
MCH: 30.8 pg (ref 26.0–34.0)
MCHC: 33.6 g/dL (ref 30.0–36.0)
MCV: 91.6 fL (ref 80.0–100.0)
Platelets: 184 10*3/uL (ref 150–400)
RBC: 3.8 MIL/uL — ABNORMAL LOW (ref 3.87–5.11)
RDW: 14.1 % (ref 11.5–15.5)
WBC: 5.7 10*3/uL (ref 4.0–10.5)
nRBC: 0 % (ref 0.0–0.2)

## 2019-07-14 LAB — COMPREHENSIVE METABOLIC PANEL
ALT: 18 U/L (ref 0–44)
AST: 18 U/L (ref 15–41)
Albumin: 3.8 g/dL (ref 3.5–5.0)
Alkaline Phosphatase: 107 U/L (ref 38–126)
Anion gap: 9 (ref 5–15)
BUN: 22 mg/dL (ref 8–23)
CO2: 20 mmol/L — ABNORMAL LOW (ref 22–32)
Calcium: 9.4 mg/dL (ref 8.9–10.3)
Chloride: 111 mmol/L (ref 98–111)
Creatinine, Ser: 1.07 mg/dL — ABNORMAL HIGH (ref 0.44–1.00)
GFR calc Af Amer: >60 mL/min (ref >60)
GFR calc non Af Amer: 53 mL/min — ABNORMAL LOW (ref >60)
Glucose, Bld: 178 mg/dL — ABNORMAL HIGH (ref 70–99)
Potassium: 4.4 mmol/L (ref 3.5–5.1)
Sodium: 140 mmol/L (ref 135–145)
Total Bilirubin: 0.6 mg/dL (ref 0.3–1.2)
Total Protein: 6.5 g/dL (ref 6.5–8.1)

## 2019-07-14 NOTE — Progress Notes (Signed)
Anesthesia consult per Dr. Woodrum, will proceed with surgery as scheduled.  

## 2019-07-15 LAB — SARS CORONAVIRUS 2 (TAT 6-24 HRS): SARS Coronavirus 2: NEGATIVE

## 2019-07-16 NOTE — H&P (Addendum)
Insurance  Med Primary: [employer] [place] ([place]) Insurance # : [id number] Med Secondary: [employer] SUPPLEMENT) Insurance # : [id number] Prescription: [place] - Member is eligible. details Chief Complaint *GYN FOLLOW-UP  Pre-op 07/17/19 Hyst, D&C, Myosure @[facility]  [facility] [address] [facility]): [place], [place] [address], Ph [phone], Fax [phone] Vitals Wt: 285.4 lbs 07/14/2019 11:07 am Ht: 5 ft 4.5 in 07/14/2019 11:05 am BMI: 48.2 07/14/2019 11:07 am BP: 168/70 07/14/2019 11:08 am Allergies ACTOS  METFORMIN  Medications Reviewed Medications allopurinoL 300 mg tablet 06/18/19   filled [employer] atorvastatin 40 mg tablet 06/18/19   filled [employer] BD Ultra-Fine Nano Pen Needle 32 gauge x 5/32" 01/19/19   filled [employer] BD Veo Insulin Syringe Ultra-Fine 1 mL 31 gauge x 15/64" 07/06/19   filled [employer] Levemir U-100 Insulin 100 unit/mL subcutaneous solution 12/04/18   filled [employer] losartan 100 mg tablet 06/18/19   filled [employer] megestroL 20 mg tablet 06/15/19   filled [employer] NovoLOG Flexpen U-100 Insulin aspart 100 unit/mL (3 mL) subcutaneous 12/04/18   filled [employer] OneTouch Ultra Blue Test Strip USE TO CHECK BLOOD SUGAR 3 TIMES DAILY BEFORE MEALS 07/30/18   filled surescripts Victoza 3-Pak 0.6 mg/0.1 mL (18 mg/3 mL) subcutaneous pen injector 10/04/18   filled [employer] Problems Reviewed Problems Diabetes mellitus - Onset: 07/09/2019 Family History Reviewed Family History Social History Routine Gyn Tobacco Smoking Status: Former smoker Alcohol intake: Occasional Marital status: Married GYN History Date of LMP: (Notes: PMP). Date of Last Mammogram: 09/25/2018 (Notes: [facility]). Date of Last Colonoscopy: 12/31/2014 (Notes: [facility]). Date of last bone density: 09/25/2018 (Notes: Normal). Date of Last Pap Smear: 06/04/2016 (Notes: WNL). History of Abnormal PAP: N. Sexually Active: Y. History of Sexually  Transmitted Infection: N. Current Birth Control Method:: Menopause. Obstetric History TOTAL FULL PRE AB. I AB. S ECTOPICS MULTIPLE LIVING 2 2      2  Past Medical History Cardiology- High Blood Pressure: Y Cardiology- High Cholesterol: Y Endocrinology- Diabetes: Y GI-Other: Y - Diverticulosis Hematology- Anemia: Y Rheumatology- Arthritis: Y Urology- Recurrent Urinary Tract Infections: Y Screening None recorded. HPI preop for pmb and thickened em. Hx of benign polyp 3 years ago. Family hx of uterine ca. Diabetes is in good control. ROS None recorded. Physical Exam Patient is a 69 year old female.  Constitutional: *General Appearance: healthy-appearing, well-nourished, and well-developed.  Head: Head: normocephalic.  Neck: *Thyroid: non-tender and no enlargement.  Lymph Nodes: *Palpation: normal.  Cardiovascular: *Auscultation: RRR and no murmur. *Peripheral Vascular: no varicosities or edema.  Lungs: *Auscultation: clear to auscultation. Inspection: normal and normal respiratory rate.  *Breast: Bilateral: no tenderness, skin changes, abnormal nipple secretions, or masses palpable and nipple appearance: normal. Right Breast: normal. Left Breast: normal.  Abdomen: *Inspection/Palpation/Auscultation: no tenderness, rebound, guarding, hepatomegaly, or splenomegaly and non-distended and soft. *Hernia: none palpated.  Back: Appearance normal. Palpation no costovertebral angle tenderness.  Female Genitalia: Vulva: no masses, atrophy, or lesions. Mons: no erythema, excoriation, atrophy, lesions, masses, swelling, tenderness, or vesicles/ ulcers and normal. Labia Majora: no erythema, excoriation, atrophy, discoloration, lesions, masses, swelling, tenderness, or vesicles/ ulcers and normal. Labia Minora: no erythema, excoriation, atrophy, discoloration, lesions, vesicles, masses, swelling, or tenderness and normal. Introitus: normal. Bartholin's Gland: normal. *Vagina: no discharge,  erythema, atrophy, lesions, ulcers, swelling, masses, tenderness, prolapse, or blood present and normal. *Cervix: no lesions, discharge, bleeding, or cervical motion tenderness and grossly normal. *Uterus: normal size and contour and midline, mobile, non-tender, and no uterine prolapse. *Urethral Meatus/ Urethra: no discharge, masses, or tenderness and  normal meatus and well supported urethra. *Bladder: non-distended, non-tender, and no palpable mass. *Adnexa/Parametria: no tenderness or mass palpable.  Extremities: Legs: normal.  Skin: *Appearance: no rashes or lesions.  Psychiatric: *Orientation: to person, place, and time. *Mood and Affect: normal mood and affect and active and alert.  from previous exam Assessment / Plan 1. Postmenopausal bleeding - em thickening on [place]. Recommend Hysteroscopy D&C posible myosure. Discussed the procedure, it's risks and benefits at length and answered her questions. She gives informed consent. N95.0: Postmenopausal bleeding  This patient has been seen and examined.   All of her questions were answered.  Labs and vital signs reviewed.  Informed consent has been obtained.  The History and Physical is current. 07/17/19 0715 DL

## 2019-07-17 ENCOUNTER — Encounter (HOSPITAL_BASED_OUTPATIENT_CLINIC_OR_DEPARTMENT_OTHER): Payer: Self-pay | Admitting: Emergency Medicine

## 2019-07-17 ENCOUNTER — Ambulatory Visit (HOSPITAL_BASED_OUTPATIENT_CLINIC_OR_DEPARTMENT_OTHER)
Admission: RE | Admit: 2019-07-17 | Discharge: 2019-07-17 | Disposition: A | Discharge status: Home / Self Care | Payer: Medicare Other | Attending: Obstetrics and Gynecology | Admitting: Obstetrics and Gynecology

## 2019-07-17 ENCOUNTER — Ambulatory Visit (HOSPITAL_BASED_OUTPATIENT_CLINIC_OR_DEPARTMENT_OTHER): Payer: Medicare Other | Admitting: Certified Registered"

## 2019-07-17 ENCOUNTER — Other Ambulatory Visit: Payer: Self-pay

## 2019-07-17 ENCOUNTER — Encounter (HOSPITAL_BASED_OUTPATIENT_CLINIC_OR_DEPARTMENT_OTHER)
Admission: RE | Disposition: A | Discharge status: Home / Self Care | Payer: Self-pay | Source: Home / Self Care | Attending: Obstetrics and Gynecology

## 2019-07-17 DIAGNOSIS — C541 Malignant neoplasm of endometrium: Secondary | ICD-10-CM | POA: Diagnosis not present

## 2019-07-17 DIAGNOSIS — E7800 Pure hypercholesterolemia, unspecified: Secondary | ICD-10-CM | POA: Diagnosis not present

## 2019-07-17 DIAGNOSIS — Z888 Allergy status to other drugs, medicaments and biological substances status: Secondary | ICD-10-CM | POA: Diagnosis not present

## 2019-07-17 DIAGNOSIS — R9389 Abnormal findings on diagnostic imaging of other specified body structures: Secondary | ICD-10-CM | POA: Diagnosis not present

## 2019-07-17 DIAGNOSIS — Z87440 Personal history of urinary (tract) infections: Secondary | ICD-10-CM | POA: Diagnosis not present

## 2019-07-17 DIAGNOSIS — K5790 Diverticulosis of intestine, part unspecified, without perforation or abscess without bleeding: Secondary | ICD-10-CM | POA: Insufficient documentation

## 2019-07-17 DIAGNOSIS — Z79899 Other long term (current) drug therapy: Secondary | ICD-10-CM | POA: Insufficient documentation

## 2019-07-17 DIAGNOSIS — Z794 Long term (current) use of insulin: Secondary | ICD-10-CM | POA: Insufficient documentation

## 2019-07-17 DIAGNOSIS — I10 Essential (primary) hypertension: Secondary | ICD-10-CM | POA: Diagnosis not present

## 2019-07-17 DIAGNOSIS — Z8049 Family history of malignant neoplasm of other genital organs: Secondary | ICD-10-CM | POA: Diagnosis not present

## 2019-07-17 DIAGNOSIS — Z6841 Body Mass Index (BMI) 40.0 and over, adult: Secondary | ICD-10-CM | POA: Insufficient documentation

## 2019-07-17 DIAGNOSIS — Z87891 Personal history of nicotine dependence: Secondary | ICD-10-CM | POA: Diagnosis not present

## 2019-07-17 DIAGNOSIS — N840 Polyp of corpus uteri: Secondary | ICD-10-CM | POA: Insufficient documentation

## 2019-07-17 DIAGNOSIS — M069 Rheumatoid arthritis, unspecified: Secondary | ICD-10-CM | POA: Diagnosis not present

## 2019-07-17 DIAGNOSIS — N950 Postmenopausal bleeding: Secondary | ICD-10-CM | POA: Insufficient documentation

## 2019-07-17 DIAGNOSIS — E119 Type 2 diabetes mellitus without complications: Secondary | ICD-10-CM | POA: Insufficient documentation

## 2019-07-17 HISTORY — PX: DILATATION & CURETTAGE/HYSTEROSCOPY WITH MYOSURE: SHX6511

## 2019-07-17 LAB — BPAM RBC
Blood Product Expiration Date: 202008142359
Blood Product Expiration Date: 202008142359
Unit Type and Rh: 5100
Unit Type and Rh: 5100

## 2019-07-17 LAB — TYPE AND SCREEN
ABO/RH(D): O POS
Antibody Screen: POSITIVE
Donor AG Type: NEGATIVE
Donor AG Type: NEGATIVE
PT AG Type: NEGATIVE
Unit division: 0
Unit division: 0

## 2019-07-17 LAB — GLUCOSE, CAPILLARY
Glucose-Capillary: 136 mg/dL — ABNORMAL HIGH (ref 70–99)
Glucose-Capillary: 135 mg/dL — ABNORMAL HIGH (ref 70–99)

## 2019-07-17 SURGERY — DILATATION & CURETTAGE/HYSTEROSCOPY WITH MYOSURE
Anesthesia: General | Site: Vagina

## 2019-07-17 MED ORDER — LACTATED RINGERS IV SOLN
INTRAVENOUS | Status: DC
  Administered 2019-07-17: 07:00:00 via INTRAVENOUS

## 2019-07-17 MED ORDER — ACETAMINOPHEN 500 MG PO TABS: 1000.0000 mg | ORAL_TABLET | Freq: Once | ORAL | Status: DC | PRN

## 2019-07-17 MED ORDER — SODIUM CHLORIDE 0.9 % IV SOLN
INTRAVENOUS | Status: AC
  Filled 2019-07-17: qty 2

## 2019-07-17 MED ORDER — DEXAMETHASONE SODIUM PHOSPHATE 4 MG/ML IJ SOLN
INTRAMUSCULAR | Status: DC | PRN
  Administered 2019-07-17: 4 mg via INTRAVENOUS

## 2019-07-17 MED ORDER — LIDOCAINE-EPINEPHRINE 1 %-1:100000 IJ SOLN
INTRAMUSCULAR | Status: AC
  Filled 2019-07-17: qty 1

## 2019-07-17 MED ORDER — ACETAMINOPHEN 10 MG/ML IV SOLN: 1000.0000 mg | Freq: Once | INTRAVENOUS | Status: DC | PRN

## 2019-07-17 MED ORDER — OXYCODONE HCL 5 MG/5ML PO SOLN: 5.0000 mg | Freq: Once | ORAL | Status: DC | PRN

## 2019-07-17 MED ORDER — SILVER NITRATE-POT NITRATE 75-25 % EX MISC
CUTANEOUS | Status: AC
  Filled 2019-07-17: qty 1

## 2019-07-17 MED ORDER — FENTANYL CITRATE (PF) 100 MCG/2ML IJ SOLN: 25.0000 ug | INTRAMUSCULAR | Status: DC | PRN

## 2019-07-17 MED ORDER — ACETAMINOPHEN 160 MG/5ML PO SOLN: 1000.0000 mg | Freq: Once | ORAL | Status: DC | PRN

## 2019-07-17 MED ORDER — PROPOFOL 10 MG/ML IV BOLUS
INTRAVENOUS | Status: DC | PRN
  Administered 2019-07-17: 150 mg via INTRAVENOUS

## 2019-07-17 MED ORDER — OXYCODONE HCL 5 MG PO TABS: 5.0000 mg | ORAL_TABLET | Freq: Once | ORAL | Status: DC | PRN

## 2019-07-17 MED ORDER — MIDAZOLAM HCL 2 MG/2ML IJ SOLN: 1.0000 mg | INTRAMUSCULAR | Status: DC | PRN

## 2019-07-17 MED ORDER — LIDOCAINE HCL (CARDIAC) PF 100 MG/5ML IV SOSY
PREFILLED_SYRINGE | INTRAVENOUS | Status: DC | PRN
  Administered 2019-07-17: 60 mg via INTRAVENOUS

## 2019-07-17 MED ORDER — SODIUM CHLORIDE 0.9 % IV SOLN
2.0000 g | INTRAVENOUS | Status: AC
  Administered 2019-07-17: 08:00:00 2 g via INTRAVENOUS

## 2019-07-17 MED ORDER — ONDANSETRON HCL 4 MG/2ML IJ SOLN
INTRAMUSCULAR | Status: DC | PRN
  Administered 2019-07-17: 4 mg via INTRAVENOUS

## 2019-07-17 MED ORDER — FENTANYL CITRATE (PF) 100 MCG/2ML IJ SOLN
INTRAMUSCULAR | Status: AC
  Filled 2019-07-17: qty 2

## 2019-07-17 MED ORDER — FENTANYL CITRATE (PF) 100 MCG/2ML IJ SOLN
50.0000 ug | INTRAMUSCULAR | Status: DC | PRN
  Administered 2019-07-17 (×2): 50 ug via INTRAVENOUS

## 2019-07-17 SURGICAL SUPPLY — 27 items
BIPOLAR CUTTING LOOP 21FR (ELECTRODE)
CANISTER SUCT 3000ML PPV (MISCELLANEOUS) ×4 IMPLANT
CATH ROBINSON RED A/P 16FR (CATHETERS) ×3 IMPLANT
CLOTH BEACON ORANGE TIMEOUT ST (SAFETY) ×4 IMPLANT
COUNTER NEEDLE 1200 MAGNETIC (NEEDLE) ×1 IMPLANT
COVER WAND RF STERILE (DRAPES) ×1 IMPLANT
DEVICE MYOSURE LITE (MISCELLANEOUS) IMPLANT
DEVICE MYOSURE REACH (MISCELLANEOUS) ×2 IMPLANT
DILATOR CANAL MILEX (MISCELLANEOUS) IMPLANT
ELECT REM PT RETURN 9FT ADLT (ELECTROSURGICAL)
ELECTRODE REM PT RTRN 9FT ADLT (ELECTROSURGICAL) IMPLANT
GAUZE 4X4 16PLY RFD (DISPOSABLE) ×3 IMPLANT
GLOVE BIO SURGEON STRL SZ8 (GLOVE) ×3 IMPLANT
GLOVE SURG ORTHO 8.0 STRL STRW (GLOVE) ×6 IMPLANT
GOWN STRL REUS W/TWL LRG LVL3 (GOWN DISPOSABLE) ×3 IMPLANT
GOWN STRL REUS W/TWL XL LVL3 (GOWN DISPOSABLE) ×3 IMPLANT
IV NS IRRIG 3000ML ARTHROMATIC (IV SOLUTION) ×3 IMPLANT
KIT PROCEDURE FLUENT (KITS) ×3 IMPLANT
LOOP CUTTING BIPOLAR 21FR (ELECTRODE) IMPLANT
MYOSURE XL FIBROID (MISCELLANEOUS)
PACK VAGINAL MINOR WOMEN LF (CUSTOM PROCEDURE TRAY) ×3 IMPLANT
PAD OB MATERNITY 4.3X12.25 (PERSONAL CARE ITEMS) ×3 IMPLANT
PAD PREP 24X48 CUFFED NSTRL (MISCELLANEOUS) ×3 IMPLANT
SEAL CERVICAL OMNI LOK (ABLATOR) IMPLANT
SEAL ROD LENS SCOPE MYOSURE (ABLATOR) ×3 IMPLANT
SYSTEM TISS REMOVAL MYOSURE XL (MISCELLANEOUS) IMPLANT
TOWEL OR 17X24 6PK STRL BLUE (TOWEL DISPOSABLE) ×6 IMPLANT

## 2019-07-17 NOTE — Op Note (Signed)
NAMEKAISLEE, CHAO MEDICAL RECORD PY:[id number] ACCOUNT [id number] DATE OF BIRTH:1950/08/25 FACILITY: [facility] LOCATION: [facility] PHYSICIAN:Izyan Ezzell C. Maliya Marich, MD  OPERATIVE REPORT  DATE OF PROCEDURE:  07/17/2019  PREOPERATIVE DIAGNOSES:  Postmenopausal bleeding and endometrial mass.  POSTOPERATIVE DIAGNOSES:  Postmenopausal bleeding and endometrial mass, endometrial polyps.  PROCEDURE:  Hysteroscopy with MyoSure resection of endometrial polyps.  SURGEON:  Louretta Shorten, MD  ANESTHESIA:  General.  INDICATIONS:  The patient is a 69 year old white female with postmenopausal bleeding who underwent an ultrasound showing a thickened endometrium.  She does have a history of endometrial polyp.  She presents for hysteroscopic evaluation and removal of the  endometrial polyps.  Risks and benefits were discussed at length.  Informed consent was obtained.  FINDINGS:  At the time of surgery, multiple large endometrial polyps within the endometrial cavity.  After removal, normal-appearing cavity, ostia, and cervix.  DESCRIPTION OF PROCEDURE:  After adequate analgesia, the patient was placed in the dorsal lithotomy position.  She was sterilely prepped and draped.  Bladder was sterilely drained.  Graves speculum was placed and tenaculum placed on the anterior lip of  the cervix.  Uterus was sounded to 8 cm, easily dilated to a #19 Pratt dilator.  Hysteroscope was inserted revealing the above findings.  The MyoSure Reach was inserted, and under direct visualization the polyps were resected all the way to the  endometrial wall and resection across the endometrial wall to ensure no underlying residual polyps.  All polyps and excess tissue were easily removed.  After completion, normal-appearing ostia.  No residual polyps.  No obvious complications noted.  The  hysteroscope was then removed.  Tenaculum was removed from the cervix.  Noted to be hemostatic.  The patient was then transferred to the recovery room in  stable condition.  Sponge and needle count was normal.  Estimated blood loss was minimal.  Saline  deficit was minimal.  The patient received cefotetan preoperatively.  DISPOSITION:  The patient will be discharged home with followup in the office in 2-3 weeks and with the routine instruction sheet for D and E.  LN/NUANCE  D:07/17/2019 T:07/17/2019 JOB:[id number]

## 2019-07-17 NOTE — Anesthesia Preprocedure Evaluation (Addendum)
Anesthesia Evaluation  Patient identified by MRN, date of birth, ID band Patient awake    Reviewed: Allergy & Precautions, NPO status , Patient's Chart, lab work & pertinent test results  History of Anesthesia Complications Negative for: history of anesthetic complications  Airway Mallampati: II  TM Distance: >3 FB Neck ROM: Full    Dental  (+) Teeth Intact   Pulmonary neg shortness of breath, neg COPD, neg recent URI, former smoker,    breath sounds clear to auscultation       Cardiovascular hypertension, Pt. on medications (-) angina(-) Past MI, (-) CHF and (-) DVT  Rhythm:Regular     Neuro/Psych negative neurological ROS  negative psych ROS   GI/Hepatic negative GI ROS, Neg liver ROS,   Endo/Other  diabetes, Insulin DependentMorbid obesity  Renal/GU negative Renal ROS  negative genitourinary   Musculoskeletal  (+) Arthritis ,   Abdominal   Peds negative pediatric ROS (+)  Hematology negative hematology ROS (+)   Anesthesia Other Findings   Reproductive/Obstetrics                            Anesthesia Physical Anesthesia Plan  ASA: III  Anesthesia Plan: General   Post-op Pain Management:    Induction: Intravenous  PONV Risk Score and Plan: 3 and Ondansetron and Dexamethasone  Airway Management Planned: LMA  Additional Equipment: None  Intra-op Plan:   Post-operative Plan: Extubation in OR  Informed Consent: I have reviewed the patients History and Physical, chart, labs and discussed the procedure including the risks, benefits and alternatives for the proposed anesthesia with the patient or authorized representative who has indicated his/her understanding and acceptance.     Dental advisory given  Plan Discussed with: CRNA and Surgeon  Anesthesia Plan Comments:         Anesthesia Quick Evaluation

## 2019-07-17 NOTE — Discharge Instructions (Signed)
Postoperative Anesthesia Instructions-Pediatric  Activity: Your child should rest for the remainder of the day. A responsible individual must stay with your child for 24 hours.  Meals: Your child should start with liquids and light foods such as gelatin or soup unless otherwise instructed by the physician. Progress to regular foods as tolerated. Avoid spicy, greasy, and heavy foods. If nausea and/or vomiting occur, drink only clear liquids such as apple juice or Pedialyte until the nausea and/or vomiting subsides. Call your physician if vomiting continues.  Special Instructions/Symptoms: Your child may be drowsy for the rest of the day, although some children experience some hyperactivity a few hours after the surgery. Your child may also experience some irritability or crying episodes due to the operative procedure and/or anesthesia. Your child's throat may feel dry or sore from the anesthesia or the breathing tube placed in the throat during surgery. Use throat lozenges, sprays, or ice chips if needed.  

## 2019-07-17 NOTE — Transfer of Care (Signed)
Immediate Anesthesia Transfer of Care Note  Patient: Antionetta Ator  Procedure(s) Performed: DILATATION & CURETTAGE/HYSTEROSCOPY WITH MYOSURE (N/A Vagina )  Patient Location: PACU  Anesthesia Type:General  Level of Consciousness: awake, alert , oriented and patient cooperative  Airway & Oxygen Therapy: Patient Spontanous Breathing and Patient connected to face mask oxygen  Post-op Assessment: Report given to RN and Post -op Vital signs reviewed and stable  Post vital signs: Reviewed and stable  Last Vitals:  Vitals Value Taken Time  BP    Temp    Pulse 91 07/17/19 0816  Resp 14 07/17/19 0816  SpO2 100 % 07/17/19 0816  Vitals shown include unvalidated device data.  Last Pain:  Vitals:   07/17/19 3012  TempSrc: Oral  PainSc: 0-No pain         Complications: No apparent anesthesia complications

## 2019-07-17 NOTE — Anesthesia Procedure Notes (Signed)
Procedure Name: LMA Insertion Date/Time: 07/17/2019 7:40 AM Performed by: Signe Colt, CRNA Pre-anesthesia Checklist: Patient identified, Emergency Drugs available, Suction available and Patient being monitored Patient Re-evaluated:Patient Re-evaluated prior to induction Oxygen Delivery Method: Circle system utilized Preoxygenation: Pre-oxygenation with 100% oxygen Induction Type: IV induction Ventilation: Mask ventilation without difficulty LMA: LMA inserted LMA Size: 4.0 Number of attempts: 1 Airway Equipment and Method: Bite block Placement Confirmation: positive ETCO2 Tube secured with: Tape Dental Injury: Teeth and Oropharynx as per pre-operative assessment

## 2019-07-18 NOTE — Anesthesia Postprocedure Evaluation (Signed)
Anesthesia Post Note  Patient: [name]  Procedure(s) Performed: DILATATION & CURETTAGE/HYSTEROSCOPY WITH MYOSURE (N/A Vagina )     Patient location during evaluation: PACU Anesthesia Type: General Level of consciousness: awake and alert Pain management: pain level controlled Vital Signs Assessment: post-procedure vital signs reviewed and stable Respiratory status: spontaneous breathing, nonlabored ventilation, respiratory function stable and patient connected to nasal cannula oxygen Cardiovascular status: blood pressure returned to baseline and stable Postop Assessment: no apparent nausea or vomiting Anesthetic complications: no    Last Vitals:  Vitals:   07/17/19 0830 07/17/19 0909  BP: (!) 176/83 (!) 152/73  Pulse: 77 74  Resp: 11   Temp:  37 C  SpO2: 100% 100%    Last Pain:  Vitals:   07/17/19 0909  TempSrc:   PainSc: 2                  Shanira Tine

## 2019-07-20 ENCOUNTER — Encounter (HOSPITAL_BASED_OUTPATIENT_CLINIC_OR_DEPARTMENT_OTHER): Payer: Self-pay | Admitting: Obstetrics and Gynecology

## 2019-07-22 ENCOUNTER — Telehealth: Payer: Self-pay | Admitting: *Deleted

## 2019-07-22 NOTE — Telephone Encounter (Signed)
Returned the patient's call and scheduled the patient for a new appt

## 2019-07-24 ENCOUNTER — Encounter: Payer: Self-pay | Admitting: Gynecologic Oncology

## 2019-07-24 ENCOUNTER — Other Ambulatory Visit: Payer: Self-pay

## 2019-07-24 ENCOUNTER — Inpatient Hospital Stay: Payer: Medicare Other | Attending: Gynecologic Oncology | Admitting: Gynecologic Oncology

## 2019-07-24 VITALS — BP 166/67 | HR 97 | Temp 98.5°F | Resp 18 | Ht 64.5 in | Wt 286.2 lb

## 2019-07-24 DIAGNOSIS — C541 Malignant neoplasm of endometrium: Secondary | ICD-10-CM | POA: Diagnosis not present

## 2019-07-24 MED ORDER — IBUPROFEN 600 MG PO TABS: 600.0000 mg | ORAL_TABLET | Freq: Four times a day (QID) | ORAL | 0 refills | Status: DC | PRN

## 2019-07-24 MED ORDER — SENNOSIDES-DOCUSATE SODIUM 8.6-50 MG PO TABS: 2.0000 | ORAL_TABLET | Freq: Every day | ORAL | 1 refills | Status: DC

## 2019-07-24 MED ORDER — OXYCODONE HCL 5 MG PO TABS: 5.0000 mg | ORAL_TABLET | ORAL | 0 refills | Status: DC | PRN

## 2019-07-24 NOTE — Patient Instructions (Addendum)
Preparing for your Surgery  Plan for surgery on August 20, 2019 with Dr. Everitt Amber at [place] will be scheduled for a robotic assisted total laparoscopic hysterectomy, bilateral salpingo-oophorectomy, sentinel lymph node biopsy, possible dilation and curettage of the uterus, possible mirena intrauterine device placement, possible laparotomy, possible bowel resection.   We will reach out to your insurance and let you know if a copay is needed for the [place] IUD.  Pre-operative Testing -You will receive a phone call from presurgical testing at [facility] if you have not received a call already to arrange for a pre-operative testing appointment before your surgery.  This appointment normally occurs one to two weeks before your scheduled surgery.   -Bring your insurance card, copy of an advanced directive if applicable, medication list  -At that visit, you will be asked to sign a consent for a possible blood transfusion in case a transfusion becomes necessary during surgery.  The need for a blood transfusion is rare but having consent is a necessary part of your care.     -STOP ASPIRIN AND KRILL OIL NOW. No ibuprofen use 1 week before surgery.  -As part of our enhanced surgical recovery pathway, you may be advised to drink a carbohydrate drink the morning of surgery (at least 3 hours before). If you are diabetic, this will be substituted with G2 gatorade in order to prevent elevated glucose levels prior to surgery.  -Do not take supplements such as fish oil (omega 3), red yeast rice, tumeric before your surgery.  Day Before Surgery at [place] will be asked to take in a light diet the day before surgery.  Avoid carbonated beverages.  You will be advised to have nothing to eat or drink after midnight the evening before.    Eat a light diet the day before surgery.  Examples including soups, broths, toast, yogurt, mashed potatoes.  Things to avoid include carbonated  beverages (fizzy beverages), raw fruits and raw vegetables, or beans.   If your bowels are filled with gas, your surgeon will have difficulty visualizing your pelvic organs which increases your surgical risks.  Starting at 4pm the day before surgery, begin drinking two bottles of magnesium citrate and only take in clear liquids after that time.  Your role in recovery Your role is to become active as soon as directed by your doctor, while still giving yourself time to heal.  Rest when you feel tired. You will be asked to do the following in order to speed your recovery:  - Cough and breathe deeply. This helps toclear and expand your lungs and can prevent pneumonia. You may be given a spirometer to practice deep breathing. A staff member will show you how to use the spirometer. - Do mild physical activity. Walking or moving your legs help your circulation and body functions return to normal. A staff member will help you when you try to walk and will provide you with simple exercises. Do not try to get up or walk alone the first time. - Actively manage your pain. Managing your pain lets you move in comfort. We will ask you to rate your pain on a scale of zero to 10. It is your responsibility to tell your doctor or nurse where and how much you hurt so your pain can be treated.  Special Considerations -If you are diabetic, you may be placed on insulin after surgery to have closer control over your blood sugars to promote healing and recovery.  This does not mean that you will be discharged on insulin.  If applicable, your oral antidiabetics will be resumed when you are tolerating a solid diet.  -Your final pathology results from surgery should be available around one week after surgery and the results will be relayed to you when available.  -Dr. Lahoma Crocker is the surgeon that assists your GYN Oncologist with surgery.  If you end up staying the night, the next day after your surgery you will  either see Dr. Denman George or Dr. Lahoma Crocker.  -FMLA forms can be faxed to [phone] and please allow 5-7 business days for completion.  Pain Management After Surgery -You have been prescribed your pain medication and bowel regimen medications before surgery so that you can have these available when you are discharged from the hospital. The pain medication is for use ONLY AFTER surgery and a new prescription will not be given.   -Make sure that you have Tylenol and Ibuprofen at home to use on a regular basis after surgery for pain control. We recommend alternating the medications every hour to six hours since they work differently and are processed in the body differently for pain relief.  -Review the attached handout on narcotic use and their risks and side effects.   Bowel Regimen -You have been prescribed Sennakot-S to take nightly to prevent constipation especially if you are taking the narcotic pain medication intermittently.  It is important to prevent constipation and drink adequate amounts of liquids.  Blood Transfusion Information WHAT IS A BLOOD TRANSFUSION? A transfusion is the replacement of blood or some of its parts. Blood is made up of multiple cells which provide different functions.  Red blood cells carry oxygen and are used for blood loss replacement.  White blood cells fight against infection.  Platelets control bleeding.  Plasma helps clot blood.  Other blood products are available for specialized needs, such as hemophilia or other clotting disorders. BEFORE THE TRANSFUSION  Who gives blood for transfusions?   You may be able to donate blood to be used at a later date on yourself (autologous donation).  Relatives can be asked to donate blood. This is generally not any safer than if you have received blood from a stranger. The same precautions are taken to ensure safety when a relative's blood is donated.  Healthy volunteers who are fully evaluated to make  sure their blood is safe. This is blood bank blood. Transfusion therapy is the safest it has ever been in the practice of medicine. Before blood is taken from a donor, a complete history is taken to make sure that person has no history of diseases nor engages in risky social behavior (examples are intravenous drug use or sexual activity with multiple partners). The donor's travel history is screened to minimize risk of transmitting infections, such as malaria. The donated blood is tested for signs of infectious diseases, such as HIV and hepatitis. The blood is then tested to be sure it is compatible with you in order to minimize the chance of a transfusion reaction. If you or a relative donates blood, this is often done in anticipation of surgery and is not appropriate for emergency situations. It takes many days to process the donated blood. RISKS AND COMPLICATIONS Although transfusion therapy is very safe and saves many lives, the main dangers of transfusion include:   Getting an infectious disease.  Developing a transfusion reaction. This is an allergic reaction to something in the blood you were given. Every precaution  is taken to prevent this. The decision to have a blood transfusion has been considered carefully by your caregiver before blood is given. Blood is not given unless the benefits outweigh the risks.  AFTER SURGERY CONSIDERATIONS (if you have the hysterectomy)  07/24/2019  Return to work: 4-6 weeks if applicable  Activity: 1. Be up and out of the bed during the day.  Take a nap if needed.  You may walk up steps but be careful and use the hand rail.  Stair climbing will tire you more than you think, you may need to stop part way and rest.   2. No lifting or straining for 6 weeks.  3. No driving for 1 week(s).  Do not drive if you are taking narcotic pain medicine.  4. Shower daily.  Use soap and water on your incision and pat dry; don't rub.  No tub baths until cleared by your  surgeon.   5. No sexual activity and nothing in the vagina for 8 weeks.  6. You may experience a small amount of clear drainage from your incisions, which is normal.  If the drainage persists or increases, please call the office.  7. You may experience vaginal spotting after surgery or around the 6-8 week mark from surgery when the stitches at the top of the vagina begin to dissolve.  The spotting is normal but if you experience heavy bleeding, call our office.  8. Take Tylenol or ibuprofen first for pain and only use narcotic pain med for severe pain not relieved by the Tylenol or Ibuprofen.  Monitor your Tylenol intake to a max of 4,000 mg a day.  Diet: 1. Low sodium Heart Healthy Diet is recommended.  2. It is safe to use a laxative, such as Miralax or Colace, if you have difficulty moving your bowels. You can take Sennakot at bedtime every evening to keep bowel movements regular and to prevent constipation.    Wound Care: 1. Keep clean and dry.  Shower daily.  Reasons to call the Doctor:  Fever - Oral temperature greater than 100.4 degrees Fahrenheit  Foul-smelling vaginal discharge  Difficulty urinating  Nausea and vomiting  Increased pain at the site of the incision that is unrelieved with pain medicine.  Difficulty breathing with or without chest pain  New calf pain especially if only on one side  Sudden, continuing increased vaginal bleeding with or without clots.   Contacts: For questions or concerns you should contact:  Dr. Everitt Amber at [phone]  Joylene John, NP at [phone]  After Hours: call [phone] and have the GYN Oncologist paged/contacted   Intrauterine Device Insertion An intrauterine device (IUD) is a medical device that gets inserted into the uterus to prevent pregnancy. It is a small, T-shaped device that has one or two nylon strings hanging down from it. The strings hang out of the lower part of the uterus (cervix) to allow for future  IUD removal. There are two types of IUDs available:  Copper IUD. This type of IUD has copper wire wrapped around it. Copper makes the uterus and fallopian tubes produce a fluid that kills sperm. A copper IUD may last up to 10 years.  Hormone IUD. This type of IUD is made of plastic and contains the hormone progestin (synthetic progesterone). The hormone thickens mucus in the cervix and prevents sperm from entering the uterus. It also thins the uterine lining to prevent implantation of a fertilized egg. The hormone can weaken or kill the sperm that  get into the uterus. A hormone IUD may last 3-5 years. Tell a health care provider about:  Any allergies you have.  All medicines you are taking, including vitamins, herbs, eye drops, creams, and over-the-counter medicines.  Any problems you or family members have had with anesthetic medicines.  Any blood disorders you have.  Any surgeries you have had.  Any medical conditions you have, including any STIs (sexually transmitted infections) you may have.  Whether you are pregnant or may be pregnant. What are the risks? Generally, this is a safe procedure. However, problems may occur, including:  Infection.  Bleeding.  Allergic reactions to medicines.  Accidental puncture (perforation) of the uterus, or damage to other structures or organs.  Accidental placement of the IUD either in the muscle layer of the uterus (myometrium) or outside the uterus.  The IUD falling out of the uterus (expulsion). This is more common among women who have recently had a child.  Pregnancy that happens in the fallopian tube (ectopic pregnancy).  Infection of the uterus and fallopian tubes (pelvic inflammatory disease). What happens before the procedure?  Schedule the IUD insertion for when you will have your menstrual period or right after, to make sure you are not pregnant. Placement of the IUD is better tolerated shortly after a menstrual cycle.  Follow  instructions from your health care provider about eating or drinking restrictions.  Ask your health care provider about changing or stopping your regular medicines. This is especially important if you are taking diabetes medicines or blood thinners.  You may get a pain reliever to take before the procedure.  You may have tests for:  Pregnancy. A pregnancy test involves having a urine sample taken.  STIs. Placing an IUD in someone who has an STI can make the infection worse.  Cervical cancer. You may have a Pap test to check for this type of cancer. This means collecting cells from your cervix to be examined under a microscope.  You may have a physical exam to determine the size and position of your uterus. The procedure may vary among health care providers and hospitals. What happens during the procedure?  A tool (speculum) will be placed in your vagina and widened so that your health care provider can see your cervix.  Medicine may be applied to your cervix to help lower your risk of infection (antiseptic medicine).  You may be given an anesthetic medicine to numb each side of your cervix (intracervical block or paracervical block). This medicine is usually given by an injection into the cervix.  A tool (uterine sound) will be inserted into your uterus to determine the length of your uterus and the direction that your uterus may be tilted.  A slim instrument or tube (IUD inserter) that holds the IUD will be inserted into your vagina, through your cervical canal, and into your uterus.  The IUD will be placed in the uterus, and the IUD inserter will be removed.  The strings that are attached to the IUD will be trimmed so that they lie just below the cervix. The procedure may vary among health care providers and hospitals. What happens after the procedure?  You may have bleeding after the procedure. This is normal. It varies from light bleeding (spotting) for a few days to  menstrual-like bleeding.  You may have cramping and pain.  You may feel dizzy or light-headed.  You may have lower back pain. Summary  An intrauterine device (IUD) is a small, T-shaped device  that has one or two nylon strings hanging down from it.  Two types of IUDs are available. You may have a copper IUD or a hormone IUD.  Schedule the IUD insertion for when you will have your menstrual period or right after, to make sure you are not pregnant. Placement of the IUD is better tolerated shortly after a menstrual cycle.  You may have bleeding after the procedure. This is normal. It varies from light spotting for a few days to menstrual-like bleeding. This information is not intended to replace advice given to you by your health care provider. Make sure you discuss any questions you have with your health care provider. Document Released: 08/15/2011 Document Revised: 11/07/2016 Document Reviewed: 11/07/2016 Elsevier Interactive Patient Education  2017 [employer].  Levonorgestrel intrauterine device (IUD) What is this medicine? LEVONORGESTREL IUD (LEE voe nor jes trel) is a contraceptive (birth control) device. The device is placed inside the uterus by a healthcare professional. It is used to prevent pregnancy. This device can also be used to treat heavy bleeding that occurs during your period. This medicine may be used for other purposes; ask your health care provider or pharmacist if you have questions. COMMON BRAND NAME(S): Minette Headland What should I tell my health care provider before I take this medicine? They need to know if you have any of these conditions: -abnormal Pap smear -cancer of the breast, uterus, or cervix -diabetes -endometritis -genital or pelvic infection now or in the past -have more than one sexual partner or your partner has more than one partner -heart disease -history of an ectopic or tubal pregnancy -immune system problems -IUD in place  -liver disease or tumor -problems with blood clots or take blood-thinners -seizures -use intravenous drugs -uterus of unusual shape -vaginal bleeding that has not been explained -an unusual or allergic reaction to levonorgestrel, other hormones, silicone, or polyethylene, medicines, foods, dyes, or preservatives -pregnant or trying to get pregnant -breast-feeding How should I use this medicine? This device is placed inside the uterus by a health care professional. Talk to your pediatrician regarding the use of this medicine in children. Special care may be needed. Overdosage: If you think you have taken too much of this medicine contact a poison control center or emergency room at once. NOTE: This medicine is only for you. Do not share this medicine with others. What if I miss a dose? This does not apply. Depending on the brand of device you have inserted, the device will need to be replaced every 3 to 5 years if you wish to continue using this type of birth control. What may interact with this medicine? Do not take this medicine with any of the following medications: -amprenavir -bosentan -fosamprenavir This medicine may also interact with the following medications: -aprepitant -armodafinil -barbiturate medicines for inducing sleep or treating seizures -bexarotene -boceprevir -griseofulvin -medicines to treat seizures like carbamazepine, ethotoin, felbamate, oxcarbazepine, phenytoin, topiramate -modafinil -pioglitazone -rifabutin -rifampin -rifapentine -some medicines to treat HIV infection like atazanavir, efavirenz, indinavir, lopinavir, nelfinavir, tipranavir, ritonavir -St. John's wort -warfarin This list may not describe all possible interactions. Give your health care provider a list of all the medicines, herbs, non-prescription drugs, or dietary supplements you use. Also tell them if you smoke, drink alcohol, or use illegal drugs. Some items may interact with your  medicine. What should I watch for while using this medicine? Visit your doctor or health care professional for regular check ups. See your doctor if you or your  partner has sexual contact with others, becomes HIV positive, or gets a sexual transmitted disease. This product does not protect you against HIV infection (AIDS) or other sexually transmitted diseases. You can check the placement of the IUD yourself by reaching up to the top of your vagina with clean fingers to feel the threads. Do not pull on the threads. It is a good habit to check placement after each menstrual period. Call your doctor right away if you feel more of the IUD than just the threads or if you cannot feel the threads at all. The IUD may come out by itself. You may become pregnant if the device comes out. If you notice that the IUD has come out use a backup birth control method like condoms and call your health care provider. Using tampons will not change the position of the IUD and are okay to use during your period. This IUD can be safely scanned with magnetic resonance imaging (MRI) only under specific conditions. Before you have an MRI, tell your healthcare provider that you have an IUD in place, and which type of IUD you have in place. What side effects may I notice from receiving this medicine? Side effects that you should report to your doctor or health care professional as soon as possible: -allergic reactions like skin rash, itching or hives, swelling of the face, lips, or tongue -fever, flu-like symptoms -genital sores -high blood pressure -no menstrual period for 6 weeks during use -pain, swelling, warmth in the leg -pelvic pain or tenderness -severe or sudden headache -signs of pregnancy -stomach cramping -sudden shortness of breath -trouble with balance, talking, or walking -unusual vaginal bleeding, discharge -yellowing of the eyes or skin Side effects that usually do not require medical attention (report  to your doctor or health care professional if they continue or are bothersome): -acne -breast pain -change in sex drive or performance -changes in weight -cramping, dizziness, or faintness while the device is being inserted -headache -irregular menstrual bleeding within first 3 to 6 months of use -nausea This list may not describe all possible side effects. Call your doctor for medical advice about side effects. You may report side effects to FDA at 1-800-FDA-1088. Where should I keep my medicine? This does not apply. NOTE: This sheet is a summary. It may not cover all possible information. If you have questions about this medicine, talk to your doctor, pharmacist, or health care provider.  2018 Elsevier/Gold Standard (2016-09-28 14:14:56)

## 2019-07-24 NOTE — H&P (View-Only) (Signed)
Consult Note: Gyn-Onc  Consult was requested by Dr. Corinna Capra for the evaluation of Lotoya Casella 69 y.o. female  CC:  Chief Complaint  Patient presents with  . Endometrial adenocarcinoma [facility])    Assessment/Plan:  Ms. Rayah Fines  is a 69 y.o.  year old with clinical stage I grade 1 endometrioid endometrial cancer.  She has an extremely complex past surgical history and likely severe abdominopelvic adhesive disease.  She is morbidly obese with a BMI of 48 kg per metered squared and has diabetes mellitus.  This would make laparotomy procedure and healing from it extremely attenuated and complex.  I discussed with the patient the options for treatment.  I discussed that endometrial cancer can be treated with hysterectomy, or medical therapy with progestins (such as progestin releasing IUD), or primary radiation therapy.  Hysterectomy would be the ideal first procedure, however it is not clear that she is an optimal candidate, and her grade 1 endometrial cancer is a typically indolent disease that is associated with high survival rates.  Surgical intervention has a high probability of requiring conversion to laparotomy, bowel resection, bowel injury, or prolonged hospitalization, non-wound healing, VTE, and possibly stomal formation.  Given the alternative medical therapies available, I think it is reasonable to explore these.  I am recommending diagnostic laparoscopy with evaluation of the peritoneal cavity.  If there is no gross significant adhesive disease that would prevent attempting a minimally invasive surgical procedure, I would recommend proceeding with hysterectomy BSO and possible sentinel lymph node biopsy.  However if the adhesive disease is significant such that conversion to laparotomy is highly probable with necessary bowel resections, or particularly risky surgical procedure, I would recommend avoiding the hysterectomy, and instead proceeding with D&C and progestin releasing IUD.  After  hearing this option the patient is in agreement with this plan of potential hysterectomy but conversion to [facility] IUD placement if this is not felt to be safe.  If we needed to place an IUD we would resample the endometrium in 3 months.  If it appeared that the cancer had regressed we would keep the IUD in place.  If the cancer was persistent we would reexplore options for therapy including potential radiation therapy.   HPI: Melida Northington is a 69 year old P2 at the request of Dr Corinna Capra for grade 1 endometrioid endometrial cancer.   Patient reports a history of postmenopausal bleeding since July 2020.  Is been persistent low low level bleeding, spotting but no heavy passage of clots.  She reported this to her gynecologist who ordered a transvaginal ultrasound on June 24, 2019.  This revealed a uterus measuring 6.7 x 3.4 x 5.5 cm with an endometrial thickness of 1.4 cm.  The ovaries were not visualized.  She was taken to the operating room for a curettage and polypectomy on July 17, 2019 which revealed FIGO grade 1 endometrioid adenocarcinoma.  Patient has a complex medical history.  She is morbidly obese with a BMI of 48 kg/m.  She experienced ruptured diverticulitis in 1997 which required a diverting colostomy and Hartman's pouch.  She 6 weeks later she was taken to the operating room for laparotomy and reversal of Hartman's.  She reports that the surgeon described her abdomen is looking like a gallon of superglue had been poured inside her.  It was an extensively long surgery.  She is unsure if additional bowel resections were necessary.  She had a 1 month hospitalization following this reversal of colostomy.  She developed a  bowel obstruction during that hospitalization and required reoperation to resolve this.  In 2000 she developed a growth on an ovary (she cannot remember which side).  For this she underwent laparotomy and oophorectomy.  This surgery was performed in [place].  She was planning to have  a hysterectomy at that procedure, however the surgeon aborted plans for hysterectomy and performed only oophorectomy after encountering significant adhesive disease.  She states that he told her surgery would have needed to the last 6 to 8 hours in order to perform hysterectomy due to the adhesive disease.  The patient has type 2 diabetes mellitus and has had this for greater than 30 years.  She reports having no endorgan disease.  Her hemoglobin A1c was 5.4% in May 2020.  Her family history is significant for a father with skin cancer.  She has 3 sisters with cancer including one with endometrial cancer who is obese, a sister with stage II 3 breast cancer, and multiple myeloma.  She has had 2 prior vaginal deliveries.  She lives in [place].  Current Meds:  Outpatient Encounter Medications as of 07/24/2019  Medication Sig  . allopurinol (ZYLOPRIM) 300 MG tablet Take 300 mg by mouth daily.  Marland Kitchen aspirin 325 MG tablet Take 325 mg by mouth daily.  Marland Kitchen atorvastatin (LIPITOR) 40 MG tablet Take 40 mg by mouth daily.  . carboxymethylcellulose (REFRESH PLUS) 0.5 % SOLN Apply 1 drop to eye daily as needed (dry eyes).  . Cholecalciferol (VITAMIN D3 PO) Take by mouth daily.  . Coenzyme Q10 (CO Q 10 PO) Take 1 tablet by mouth daily.  . diphenhydrAMINE (BENADRYL) 25 mg capsule Take 50 mg by mouth at bedtime.  . famotidine (PEPCID) 20 MG tablet Take 20 mg by mouth 2 (two) times daily.  Marland Kitchen glucosamine-chondroitin 500-400 MG tablet Take 1 tablet by mouth 2 (two) times daily.  . insulin NPH-regular Human (70-30) 100 UNIT/ML injection Inject 80 Units into the skin 2 (two) times daily with a meal.  . KRILL OIL PO Take 1 capsule by mouth daily.  Marland Kitchen losartan (COZAAR) 100 MG tablet Take 100 mg by mouth daily.  . Multiple Vitamins-Minerals (MULTIVITAMIN WITH MINERALS) tablet Take 1 tablet by mouth daily.  . vitamin A (AQUASOL-A) 50000 units/mL injection Inject into the muscle daily.  . [DISCONTINUED] ibuprofen  (ADVIL,MOTRIN) 200 MG tablet Take 800 mg by mouth every 6 (six) hours as needed for mild pain.  Marland Kitchen ibuprofen (ADVIL) 600 MG tablet Take 1 tablet (600 mg total) by mouth every 6 (six) hours as needed for moderate pain. For AFTER surgery only  . oxyCODONE (OXY IR/ROXICODONE) 5 MG immediate release tablet Take 1 tablet (5 mg total) by mouth every 4 (four) hours as needed for severe pain. For AFTER surgery, do not take and drive  . senna-docusate (SENOKOT-S) 8.6-50 MG tablet Take 2 tablets by mouth at bedtime. For AFTER surgery, do not take if having diarrhea   No facility-administered encounter medications on file as of 07/24/2019.     Allergy:  Allergies  Allergen Reactions  . Lisinopril Cough  . Metformin And Related Other (See Comments)    Social Hx:   Social History   Socioeconomic History  . Marital status: Married    Spouse name: Not on file  . Number of children: Not on file  . Years of education: Not on file  . Highest education level: Not on file  Occupational History  . Not on file  Social Needs  . Emergency planning/management officer  strain: Not on file  . Food insecurity    Worry: Not on file    Inability: Not on file  . Transportation needs    Medical: Not on file    Non-medical: Not on file  Tobacco Use  . Smoking status: Former Smoker    Quit date: 06/08/1987    Years since quitting: 32.1  . Smokeless tobacco: Never Used  Substance and Sexual Activity  . Alcohol use: Yes    Comment: social  . Drug use: No  . Sexual activity: Yes    Birth control/protection: Post-menopausal  Lifestyle  . Physical activity    Days per week: Not on file    Minutes per session: Not on file  . Stress: Not on file  Relationships  . Social Herbalist on phone: Not on file    Gets together: Not on file    Attends religious service: Not on file    Active member of club or organization: Not on file    Attends meetings of clubs or organizations: Not on file    Relationship status: Not on  file  . Intimate partner violence    Fear of current or ex partner: Not on file    Emotionally abused: Not on file    Physically abused: Not on file    Forced sexual activity: Not on file  Other Topics Concern  . Not on file  Social History Narrative  . Not on file    Past Surgical Hx:  Past Surgical History:  Procedure Laterality Date  . ABDOMINAL SURGERY    . COLON SURGERY    . DILATATION & CURETTAGE/HYSTEROSCOPY WITH MYOSURE N/A 07/17/2019   Procedure: DILATATION & CURETTAGE/HYSTEROSCOPY WITH MYOSURE;  Surgeon: Louretta Shorten, MD;  Location: [facility];  Service: Gynecology;  Laterality: N/A;    Past Medical Hx:  Past Medical History:  Diagnosis Date  . Arthritis   . Cancer ([place])    skin CA  . Diabetes mellitus without complication ([place])   . Diverticulitis   . Hypertension     Past Gynecological History:  See HPI.  No LMP recorded. Patient is postmenopausal.  Family Hx:  Family History  Problem Relation Age of Onset  . Diabetes Sister   . Skin cancer Father     Review of Systems:  Constitutional  Feels well,   ENT Normal appearing ears and nares bilaterally Skin/Breast  No rash, sores, jaundice, itching, dryness Cardiovascular  No chest pain, shortness of breath, or edema  Pulmonary  No cough or wheeze.  Gastro Intestinal  No nausea, vomitting, or diarrhoea. No bright red blood per rectum, no abdominal pain, change in bowel movement, or constipation.  Genito Urinary  No frequency, urgency, dysuria, + bleeding Musculo Skeletal  No myalgia, arthralgia, joint swelling or pain  Neurologic  No weakness, numbness, change in gait,  Psychology  No depression, anxiety, insomnia.   Vitals:  Blood pressure (!) 166/67, pulse 97, temperature 98.5 F (36.9 C), temperature source Oral, resp. rate 18, height 5' 4.5" (1.638 m), weight 286 lb 3.2 oz (129.8 kg), SpO2 100 %.  Physical Exam: WD in NAD Neck  Supple NROM, without any enlargements.  Lymph  Node Survey No cervical supraclavicular or inguinal adenopathy Cardiovascular  Pulse normal rate, regularity and rhythm. S1 and S2 normal.  Lungs  Clear to auscultation bilateraly, without wheezes/crackles/rhonchi. Good air movement.  Skin  No rash/lesions/breakdown  Psychiatry  Alert and oriented to person, place, and time  Abdomen  Normoactive bowel sounds, abdomen soft, non-tender and obese without evidence of hernia. Old vertical paramedian incision on right and left transverse colostomy site incision.  Back No CVA tenderness Genito Urinary  Vulva/vagina: Normal external female genitalia.  No lesions. No discharge or bleeding.  Bladder/urethra:  No lesions or masses, well supported bladder  Vagina: norma  Cervix: Normal appearing, no lesions.  Uterus:  Small, mobile, no parametrial involvement or nodularity.  Adnexa: no palpable masses. Rectal  deferred Extremities  No bilateral cyanosis, clubbing or edema.   Thereasa Solo, MD  07/24/2019, 5:32 PM

## 2019-07-24 NOTE — Progress Notes (Signed)
Consult Note: Gyn-Onc  Consult was requested by Dr. Corinna Capra for the evaluation of [name] 69 y.o. female  CC:  Chief Complaint  Patient presents with  . Endometrial adenocarcinoma [facility])    Assessment/Plan:  [name]  is a 69 y.o.  year old with clinical stage I grade 1 endometrioid endometrial cancer.  She has an extremely complex past surgical history and likely severe abdominopelvic adhesive disease.  She is morbidly obese with a BMI of 48 kg per metered squared and has diabetes mellitus.  This would make laparotomy procedure and healing from it extremely attenuated and complex.  I discussed with the patient the options for treatment.  I discussed that endometrial cancer can be treated with hysterectomy, or medical therapy with progestins (such as progestin releasing IUD), or primary radiation therapy.  Hysterectomy would be the ideal first procedure, however it is not clear that she is an optimal candidate, and her grade 1 endometrial cancer is a typically indolent disease that is associated with high survival rates.  Surgical intervention has a high probability of requiring conversion to laparotomy, bowel resection, bowel injury, or prolonged hospitalization, non-wound healing, VTE, and possibly stomal formation.  Given the alternative medical therapies available, I think it is reasonable to explore these.  I am recommending diagnostic laparoscopy with evaluation of the peritoneal cavity.  If there is no gross significant adhesive disease that would prevent attempting a minimally invasive surgical procedure, I would recommend proceeding with hysterectomy BSO and possible sentinel lymph node biopsy.  However if the adhesive disease is significant such that conversion to laparotomy is highly probable with necessary bowel resections, or particularly risky surgical procedure, I would recommend avoiding the hysterectomy, and instead proceeding with D&C and progestin releasing IUD.  After  hearing this option the patient is in agreement with this plan of potential hysterectomy but conversion to [name] IUD placement if this is not felt to be safe.  If we needed to place an IUD we would resample the endometrium in 3 months.  If it appeared that the cancer had regressed we would keep the IUD in place.  If the cancer was persistent we would reexplore options for therapy including potential radiation therapy.   HPI: [name] is a 69 year old P2 at the request of Dr Corinna Capra for grade 1 endometrioid endometrial cancer.   Patient reports a history of postmenopausal bleeding since July 2020.  Is been persistent low low level bleeding, spotting but no heavy passage of clots.  She reported this to her gynecologist who ordered a transvaginal ultrasound on June 24, 2019.  This revealed a uterus measuring 6.7 x 3.4 x 5.5 cm with an endometrial thickness of 1.4 cm.  The ovaries were not visualized.  She was taken to the operating room for a curettage and polypectomy on July 17, 2019 which revealed FIGO grade 1 endometrioid adenocarcinoma.  Patient has a complex medical history.  She is morbidly obese with a BMI of 48 kg/m.  She experienced ruptured diverticulitis in 1997 which required a diverting colostomy and Hartman's pouch.  She 6 weeks later she was taken to the operating room for laparotomy and reversal of Hartman's.  She reports that the surgeon described her abdomen is looking like a gallon of superglue had been poured inside her.  It was an extensively long surgery.  She is unsure if additional bowel resections were necessary.  She had a 1 month hospitalization following this reversal of colostomy.  She developed a  bowel obstruction during that hospitalization and required reoperation to resolve this.  In 2000 she developed a growth on an ovary (she cannot remember which side).  For this she underwent laparotomy and oophorectomy.  This surgery was performed in [place].  She was planning to have  a hysterectomy at that procedure, however the surgeon aborted plans for hysterectomy and performed only oophorectomy after encountering significant adhesive disease.  She states that he told her surgery would have needed to the last 6 to 8 hours in order to perform hysterectomy due to the adhesive disease.  The patient has type 2 diabetes mellitus and has had this for greater than 30 years.  She reports having no endorgan disease.  Her hemoglobin A1c was 5.4% in May 2020.  Her family history is significant for a father with skin cancer.  She has 3 sisters with cancer including one with endometrial cancer who is obese, a sister with stage II 3 breast cancer, and multiple myeloma.  She has had 2 prior vaginal deliveries.  She lives in [place].  Current Meds:  Outpatient Encounter Medications as of 07/24/2019  Medication Sig  . allopurinol (ZYLOPRIM) 300 MG tablet Take 300 mg by mouth daily.  Marland Kitchen aspirin 325 MG tablet Take 325 mg by mouth daily.  Marland Kitchen atorvastatin (LIPITOR) 40 MG tablet Take 40 mg by mouth daily.  . carboxymethylcellulose (REFRESH PLUS) 0.5 % SOLN Apply 1 drop to eye daily as needed (dry eyes).  . Cholecalciferol (VITAMIN D3 PO) Take by mouth daily.  . Coenzyme Q10 (CO Q 10 PO) Take 1 tablet by mouth daily.  . diphenhydrAMINE (BENADRYL) 25 mg capsule Take 50 mg by mouth at bedtime.  . famotidine (PEPCID) 20 MG tablet Take 20 mg by mouth 2 (two) times daily.  Marland Kitchen glucosamine-chondroitin 500-400 MG tablet Take 1 tablet by mouth 2 (two) times daily.  . insulin NPH-regular Human (70-30) 100 UNIT/ML injection Inject 80 Units into the skin 2 (two) times daily with a meal.  . KRILL OIL PO Take 1 capsule by mouth daily.  Marland Kitchen losartan (COZAAR) 100 MG tablet Take 100 mg by mouth daily.  . Multiple Vitamins-Minerals (MULTIVITAMIN WITH MINERALS) tablet Take 1 tablet by mouth daily.  . vitamin A (AQUASOL-A) 50000 units/mL injection Inject into the muscle daily.  . [DISCONTINUED] ibuprofen  (ADVIL,MOTRIN) 200 MG tablet Take 800 mg by mouth every 6 (six) hours as needed for mild pain.  Marland Kitchen ibuprofen (ADVIL) 600 MG tablet Take 1 tablet (600 mg total) by mouth every 6 (six) hours as needed for moderate pain. For AFTER surgery only  . oxyCODONE (OXY IR/ROXICODONE) 5 MG immediate release tablet Take 1 tablet (5 mg total) by mouth every 4 (four) hours as needed for severe pain. For AFTER surgery, do not take and drive  . senna-docusate (SENOKOT-S) 8.6-50 MG tablet Take 2 tablets by mouth at bedtime. For AFTER surgery, do not take if having diarrhea   No facility-administered encounter medications on file as of 07/24/2019.     Allergy:  Allergies  Allergen Reactions  . Lisinopril Cough  . Metformin And Related Other (See Comments)    Social Hx:   Social History   Socioeconomic History  . Marital status: Married    Spouse name: Not on file  . Number of children: Not on file  . Years of education: Not on file  . Highest education level: Not on file  Occupational History  . Not on file  Social Needs  . Emergency planning/management officer  strain: Not on file  . Food insecurity    Worry: Not on file    Inability: Not on file  . Transportation needs    Medical: Not on file    Non-medical: Not on file  Tobacco Use  . Smoking status: Former Smoker    Quit date: 06/08/1987    Years since quitting: 32.1  . Smokeless tobacco: Never Used  Substance and Sexual Activity  . Alcohol use: Yes    Comment: social  . Drug use: No  . Sexual activity: Yes    Birth control/protection: Post-menopausal  Lifestyle  . Physical activity    Days per week: Not on file    Minutes per session: Not on file  . Stress: Not on file  Relationships  . Social Herbalist on phone: Not on file    Gets together: Not on file    Attends religious service: Not on file    Active member of club or organization: Not on file    Attends meetings of clubs or organizations: Not on file    Relationship status: Not on  file  . Intimate partner violence    Fear of current or ex partner: Not on file    Emotionally abused: Not on file    Physically abused: Not on file    Forced sexual activity: Not on file  Other Topics Concern  . Not on file  Social History Narrative  . Not on file    Past Surgical Hx:  Past Surgical History:  Procedure Laterality Date  . ABDOMINAL SURGERY    . COLON SURGERY    . DILATATION & CURETTAGE/HYSTEROSCOPY WITH MYOSURE N/A 07/17/2019   Procedure: DILATATION & CURETTAGE/HYSTEROSCOPY WITH MYOSURE;  Surgeon: Louretta Shorten, MD;  Location: [facility];  Service: Gynecology;  Laterality: N/A;    Past Medical Hx:  Past Medical History:  Diagnosis Date  . Arthritis   . Cancer ([place])    skin CA  . Diabetes mellitus without complication ([place])   . Diverticulitis   . Hypertension     Past Gynecological History:  See HPI.  No LMP recorded. Patient is postmenopausal.  Family Hx:  Family History  Problem Relation Age of Onset  . Diabetes Sister   . Skin cancer Father     Review of Systems:  Constitutional  Feels well,   ENT Normal appearing ears and nares bilaterally Skin/Breast  No rash, sores, jaundice, itching, dryness Cardiovascular  No chest pain, shortness of breath, or edema  Pulmonary  No cough or wheeze.  Gastro Intestinal  No nausea, vomitting, or diarrhoea. No bright red blood per rectum, no abdominal pain, change in bowel movement, or constipation.  Genito Urinary  No frequency, urgency, dysuria, + bleeding Musculo Skeletal  No myalgia, arthralgia, joint swelling or pain  Neurologic  No weakness, numbness, change in gait,  Psychology  No depression, anxiety, insomnia.   Vitals:  Blood pressure (!) 166/67, pulse 97, temperature 98.5 F (36.9 C), temperature source Oral, resp. rate 18, height 5' 4.5" (1.638 m), weight 286 lb 3.2 oz (129.8 kg), SpO2 100 %.  Physical Exam: WD in NAD Neck  Supple NROM, without any enlargements.  Lymph  Node Survey No cervical supraclavicular or inguinal adenopathy Cardiovascular  Pulse normal rate, regularity and rhythm. S1 and S2 normal.  Lungs  Clear to auscultation bilateraly, without wheezes/crackles/rhonchi. Good air movement.  Skin  No rash/lesions/breakdown  Psychiatry  Alert and oriented to person, place, and time  Abdomen  Normoactive bowel sounds, abdomen soft, non-tender and obese without evidence of hernia. Old vertical paramedian incision on right and left transverse colostomy site incision.  Back No CVA tenderness Genito Urinary  Vulva/vagina: Normal external female genitalia.  No lesions. No discharge or bleeding.  Bladder/urethra:  No lesions or masses, well supported bladder  Vagina: norma  Cervix: Normal appearing, no lesions.  Uterus:  Small, mobile, no parametrial involvement or nodularity.  Adnexa: no palpable masses. Rectal  deferred Extremities  No bilateral cyanosis, clubbing or edema.   Thereasa Solo, MD  07/24/2019, 5:32 PM

## 2019-07-27 ENCOUNTER — Other Ambulatory Visit: Payer: Self-pay | Admitting: Gynecologic Oncology

## 2019-07-27 DIAGNOSIS — C541 Malignant neoplasm of endometrium: Secondary | ICD-10-CM

## 2019-08-03 ENCOUNTER — Telehealth: Payer: Self-pay | Admitting: Oncology

## 2019-08-03 ENCOUNTER — Other Ambulatory Visit: Payer: Self-pay | Admitting: Gynecologic Oncology

## 2019-08-03 DIAGNOSIS — C541 Malignant neoplasm of endometrium: Secondary | ICD-10-CM

## 2019-08-03 MED ORDER — LEVONORGESTREL 20 MCG/24HR IU IUD: 1.0000 | INTRAUTERINE_SYSTEM | Freq: Once | INTRAUTERINE | 0 refills | Status: DC

## 2019-08-03 MED FILL — MIRENA SYSTEM: 20 | 90 days supply | Qty: 1 | Fill #0

## 2019-08-03 NOTE — Progress Notes (Signed)
IUD sent to [facility].

## 2019-08-03 NOTE — Telephone Encounter (Signed)
Called [facility] and informed her that her copay for the Mirena IUD will be $514.00.  She verbalized understanding and agreement.  Advised her that will will call her with the next steps.

## 2019-08-03 NOTE — Telephone Encounter (Signed)
Called [name] back and advised her to call the [facility] at [phone] to pay her copay.  Also discussed that it if is not used during surgery, it will be returned for a refund.  She verbalized understanding and agreement.

## 2019-08-14 NOTE — Patient Instructions (Addendum)
YOU NEED TO HAVE A COVID 19 TEST ON 08-17-2019 AT 1210PM, THIS TEST MUST BE DONE BEFORE SURGERY, COME  [place], [place] , [address]. ONCE YOUR COVID TEST IS COMPLETED, PLEASE BEGIN THE QUARANTINE INSTRUCTIONS AS OUTLINED IN YOUR HANDOUT.                [name]    Your procedure is scheduled on: 08-20-2019   Report to [facility]  Entrance    Report to admitting at 800 AM    1 VISITOR IS ALLOWED TO WAIT IN WAITING ROOM  ONLY DAY OF YOUR SURGERY. NO VISITORS ARE ALLOWED IN SHORT STAY OR RECOVERY ROOM.   Call this number if you have problems the morning of surgery [phone]    Remember: Do not eat food  :after 200 pm Wednesday 08-19-2019,    start clear liquids until 700 am,    drink 2 bottles of magnesium citrate starting at 200 pm Wednesday.     BRUSH YOUR TEETH MORNING OF SURGERY AND RINSE YOUR MOUTH OUT, NO CHEWING GUM CANDY OR MINTS.   PLEASE FINISH  G2 DRINK PER SURGEON ORDER  WHICH NEEDS TO BE COMPLETED AT 700 AM.     CLEAR LIQUID DIET   Foods Allowed                                                                     Foods Excluded  Water, Black Coffee and tea, regular and decaf                             liquids that you cannot  Plain Jell-O any favor except red or purple                                           see through such as: Fruit ices (not with fruit pulp)                                     milk, soups, orange juice  Iced Popsicles                                    All solid food                                   Cranberry, grape and apple juices Sports drinks like Gatorade Lightly seasoned clear broth or consume(fat free) Sugar, honey syrup  Sample Menu Breakfast                                Lunch                                     Supper Cranberry  juice                    Beef broth                            Chicken broth Jell-O                                     Grape juice                           Apple  juice Coffee or tea                        Jell-O                                      Popsicle                                                Coffee or tea                        Coffee or tea  _____________________________________________________________________    Take these medicines the morning of surgery with A SIP OF WATER: Allopurinol, Famotidine        How to Manage Your Diabetes Before and After Surgery  Why is it important to control my blood sugar before and after surgery? . Improving blood sugar levels before and after surgery helps healing and can limit problems. . A way of improving blood sugar control is eating a healthy diet by: o  Eating less sugar and carbohydrates o  Increasing activity/exercise o  Talking with your doctor about reaching your blood sugar goals . High blood sugars (greater than 180 mg/dL) can raise your risk of infections and slow your recovery, so you will need to focus on controlling your diabetes during the weeks before surgery. . Make sure that the doctor who takes care of your diabetes knows about your planned surgery including the date and location.  How do I manage my blood sugar before surgery? . Check your blood sugar at least 4 times a day, starting 2 days before surgery, to make sure that the level is not too high or low. o Check your blood sugar the morning of your surgery when you wake up and every 2 hours until you get to the Short Stay unit. . If your blood sugar is less than 70 mg/dL, you will need to treat for low blood sugar: o Do not take insulin. o Treat a low blood sugar (less than 70 mg/dL) with  cup of clear juice (cranberry or apple), 4 glucose tablets, OR glucose gel. o Recheck blood sugar in 15 minutes after treatment (to make sure it is greater than 70 mg/dL). If your blood sugar is not greater than 70 mg/dL on recheck, call [phone] for further instructions. . Report your blood sugar to the short stay nurse when you get  to Short Stay.  . If you are admitted to the hospital after surgery: o Your blood sugar will be checked by  the staff and you will probably be given insulin after surgery (instead of oral diabetes medicines) to make sure you have good blood sugar levels. o The goal for blood sugar control after surgery is 80-180 mg/dL.   WHAT DO I DO ABOUT MY DIABETES MEDICATION?  Marland Kitchen   . THE  DAY BEFORE SURGERY TAKE USUAL MORNING DOSE OF NPH INSULIN, TAKE 1/2 OF EVENING DOSE OF NPH INSULIN   TAKE (TAKE 37 UNITS).       . THE MORNING OF SURGERY TAKE 1/2 DOSE OF NPH INSULIN (TAKE 37 UNITS)  . The day of surgery, do not take other diabetes injectables, including Byetta (exenatide), Bydureon (exenatide ER), Victoza (liraglutide), or Trulicity (dulaglutide).                        You may not have any metal on your body including hair pins and              piercings  Do not wear jewelry, make-up, lotions, powders or perfumes, deodorant             Do not wear nail polish.  Do not shave  48 hours prior to surgery.                 Do not bring valuables to the hospital. [name].  Contacts, dentures or bridgework may not be worn into surgery.   Leave suitcase in the car. After surgery it may be brought to your room.               Please read over the following fact sheets you were given: _____________________________________________________________________          [facility] Preparing for [facility] Before surgery, you can play an important role.  Because skin is not sterile, your skin needs to be as free of germs as possible.  You can reduce the number of germs on your skin by washing with CHG (chlorahexidine gluconate) soap before surgery.  CHG is an antiseptic cleaner which kills germs and bonds with the skin to continue killing germs even after washing. Please DO NOT use if you have an allergy to CHG or antibacterial soaps.  If your skin becomes  reddened/irritated stop using the CHG and inform your nurse when you arrive at Short Stay. Do not shave (including legs and underarms) for at least 48 hours prior to the first CHG shower.  You may shave your face/neck. Please follow these instructions carefully:  1.  Shower with CHG Soap the night before surgery and the  morning of Surgery.  2.  If you choose to wash your hair, wash your hair first as usual with your  normal  shampoo.  3.  After you shampoo, rinse your hair and body thoroughly to remove the  shampoo.                             4.  Use CHG as you would any other liquid soap.  You can apply chg directly  to the skin and wash                       Gently with a scrungie or clean washcloth.  5.  Apply the CHG Soap to your body ONLY FROM THE NECK  DOWN.   Do not use on face/ open                           Wound or open sores. Avoid contact with eyes, ears mouth and genitals (private parts).                       Wash face,  Genitals (private parts) with your normal soap.             6.  Wash thoroughly, paying special attention to the area where your surgery  will be performed.  7.  Thoroughly rinse your body with warm water from the neck down.  8.  DO NOT shower/wash with your normal soap after using and rinsing off  the CHG Soap.                9.  Pat yourself dry with a clean towel.            10.  Wear clean pajamas.            11.  Place clean sheets on your bed the night of your first shower and do not  sleep with pets. Day of Surgery : Do not apply any lotions/deodorants the morning of surgery.  Please wear clean clothes to the hospital/surgery center.  FAILURE TO FOLLOW THESE INSTRUCTIONS MAY RESULT IN THE CANCELLATION OF YOUR SURGERY PATIENT SIGNATURE_________________________________  NURSE SIGNATURE__________________________________  ________________________________________________________________________   Adam Phenix  An incentive spirometer is a tool  that can help keep your lungs clear and active. This tool measures how well you are filling your lungs with each breath. Taking long deep breaths may help reverse or decrease the chance of developing breathing (pulmonary) problems (especially infection) following:  A long period of time when you are unable to move or be active. BEFORE THE PROCEDURE   If the spirometer includes an indicator to show your best effort, your nurse or respiratory therapist will set it to a desired goal.  If possible, sit up straight or lean slightly forward. Try not to slouch.  Hold the incentive spirometer in an upright position. INSTRUCTIONS FOR USE  1. Sit on the edge of your bed if possible, or sit up as far as you can in bed or on a chair. 2. Hold the incentive spirometer in an upright position. 3. Breathe out normally. 4. Place the mouthpiece in your mouth and seal your lips tightly around it. 5. Breathe in slowly and as deeply as possible, raising the piston or the ball toward the top of the column. 6. Hold your breath for 3-5 seconds or for as long as possible. Allow the piston or ball to fall to the bottom of the column. 7. Remove the mouthpiece from your mouth and breathe out normally. 8. Rest for a few seconds and repeat Steps 1 through 7 at least 10 times every 1-2 hours when you are awake. Take your time and take a few normal breaths between deep breaths. 9. The spirometer may include an indicator to show your best effort. Use the indicator as a goal to work toward during each repetition. 10. After each set of 10 deep breaths, practice coughing to be sure your lungs are clear. If you have an incision (the cut made at the time of surgery), support your incision when coughing by placing a pillow or rolled up towels firmly against it. Once you are able to  get out of bed, walk around indoors and cough well. You may stop using the incentive spirometer when instructed by your caregiver.  RISKS AND  COMPLICATIONS  Take your time so you do not get dizzy or light-headed.  If you are in pain, you may need to take or ask for pain medication before doing incentive spirometry. It is harder to take a deep breath if you are having pain. AFTER USE  Rest and breathe slowly and easily.  It can be helpful to keep track of a log of your progress. Your caregiver can provide you with a simple table to help with this. If you are using the spirometer at home, follow these instructions: [place] IF:   You are having difficultly using the spirometer.  You have trouble using the spirometer as often as instructed.  Your pain medication is not giving enough relief while using the spirometer.  You develop fever of 100.5 F (38.1 C) or higher. SEEK IMMEDIATE MEDICAL CARE IF:   You cough up bloody sputum that had not been present before.  You develop fever of 102 F (38.9 C) or greater.  You develop worsening pain at or near the incision site. MAKE SURE YOU:   Understand these instructions.  Will watch your condition.  Will get help right away if you are not doing well or get worse. Document Released: 04/29/2007 Document Revised: 03/10/2012 Document Reviewed: 06/30/2007 Exit[employer] Patient Information 2014 Exit[employer], [place].   ________________________________________________________________________  WHAT IS A BLOOD TRANSFUSION? Blood Transfusion Information  A transfusion is the replacement of blood or some of its parts. Blood is made up of multiple cells which provide different functions.  Red blood cells carry oxygen and are used for blood loss replacement.  White blood cells fight against infection.  Platelets control bleeding.  Plasma helps clot blood.  Other blood products are available for specialized needs, such as hemophilia or other clotting disorders. BEFORE THE TRANSFUSION  Who gives blood for transfusions?   Healthy volunteers who are fully evaluated to make sure  their blood is safe. This is blood bank blood. Transfusion therapy is the safest it has ever been in the practice of medicine. Before blood is taken from a donor, a complete history is taken to make sure that person has no history of diseases nor engages in risky social behavior (examples are intravenous drug use or sexual activity with multiple partners). The donor's travel history is screened to minimize risk of transmitting infections, such as malaria. The donated blood is tested for signs of infectious diseases, such as HIV and hepatitis. The blood is then tested to be sure it is compatible with you in order to minimize the chance of a transfusion reaction. If you or a relative donates blood, this is often done in anticipation of surgery and is not appropriate for emergency situations. It takes many days to process the donated blood. RISKS AND COMPLICATIONS Although transfusion therapy is very safe and saves many lives, the main dangers of transfusion include:   Getting an infectious disease.  Developing a transfusion reaction. This is an allergic reaction to something in the blood you were given. Every precaution is taken to prevent this. The decision to have a blood transfusion has been considered carefully by your caregiver before blood is given. Blood is not given unless the benefits outweigh the risks. AFTER THE TRANSFUSION  Right after receiving a blood transfusion, you will usually feel much better and more energetic. This is especially true if  your red blood cells have gotten low (anemic). The transfusion raises the level of the red blood cells which carry oxygen, and this usually causes an energy increase.  The nurse administering the transfusion will monitor you carefully for complications. HOME CARE INSTRUCTIONS  No special instructions are needed after a transfusion. You may find your energy is better. Speak with your caregiver about any limitations on activity for underlying diseases  you may have. SEEK MEDICAL CARE IF:   Your condition is not improving after your transfusion.  You develop redness or irritation at the intravenous (IV) site. SEEK IMMEDIATE MEDICAL CARE IF:  Any of the following symptoms occur over the next 12 hours:  Shaking chills.  You have a temperature by mouth above 102 F (38.9 C), not controlled by medicine.  Chest, back, or muscle pain.  People around you feel you are not acting correctly or are confused.  Shortness of breath or difficulty breathing.  Dizziness and fainting.  You get a rash or develop hives.  You have a decrease in urine output.  Your urine turns a dark color or changes to pink, red, or brown. Any of the following symptoms occur over the next 10 days:  You have a temperature by mouth above 102 F (38.9 C), not controlled by medicine.  Shortness of breath.  Weakness after normal activity.  The white part of the eye turns yellow (jaundice).  You have a decrease in the amount of urine or are urinating less often.  Your urine turns a dark color or changes to pink, red, or brown. Document Released: 12/14/2000 Document Revised: 03/10/2012 Document Reviewed: 08/02/2008 [facility] Patient Information 2014 [place], [place].  _______________________________________________________________________

## 2019-08-17 ENCOUNTER — Other Ambulatory Visit: Payer: Self-pay

## 2019-08-17 ENCOUNTER — Encounter (HOSPITAL_COMMUNITY): Payer: Self-pay

## 2019-08-17 ENCOUNTER — Other Ambulatory Visit (HOSPITAL_COMMUNITY)
Admission: RE | Admit: 2019-08-17 | Discharge: 2019-08-17 | Disposition: A | Discharge status: Home / Self Care | Payer: Medicare Other | Source: Ambulatory Visit | Attending: Gynecologic Oncology | Admitting: Gynecologic Oncology

## 2019-08-17 ENCOUNTER — Encounter (HOSPITAL_COMMUNITY)
Admission: RE | Admit: 2019-08-17 | Discharge: 2019-08-17 | Disposition: A | Discharge status: Home / Self Care | Payer: Medicare Other | Source: Ambulatory Visit | Attending: Gynecologic Oncology | Admitting: Gynecologic Oncology

## 2019-08-17 DIAGNOSIS — Z85828 Personal history of other malignant neoplasm of skin: Secondary | ICD-10-CM | POA: Diagnosis not present

## 2019-08-17 DIAGNOSIS — C541 Malignant neoplasm of endometrium: Secondary | ICD-10-CM | POA: Insufficient documentation

## 2019-08-17 DIAGNOSIS — Z538 Procedure and treatment not carried out for other reasons: Secondary | ICD-10-CM | POA: Diagnosis not present

## 2019-08-17 DIAGNOSIS — Z8049 Family history of malignant neoplasm of other genital organs: Secondary | ICD-10-CM | POA: Diagnosis not present

## 2019-08-17 DIAGNOSIS — Z794 Long term (current) use of insulin: Secondary | ICD-10-CM | POA: Diagnosis not present

## 2019-08-17 DIAGNOSIS — Z01812 Encounter for preprocedural laboratory examination: Secondary | ICD-10-CM | POA: Insufficient documentation

## 2019-08-17 DIAGNOSIS — Z803 Family history of malignant neoplasm of breast: Secondary | ICD-10-CM | POA: Diagnosis not present

## 2019-08-17 DIAGNOSIS — K219 Gastro-esophageal reflux disease without esophagitis: Secondary | ICD-10-CM | POA: Diagnosis not present

## 2019-08-17 DIAGNOSIS — E119 Type 2 diabetes mellitus without complications: Secondary | ICD-10-CM | POA: Diagnosis not present

## 2019-08-17 DIAGNOSIS — Z7982 Long term (current) use of aspirin: Secondary | ICD-10-CM | POA: Diagnosis not present

## 2019-08-17 DIAGNOSIS — Z20828 Contact with and (suspected) exposure to other viral communicable diseases: Secondary | ICD-10-CM | POA: Insufficient documentation

## 2019-08-17 DIAGNOSIS — I10 Essential (primary) hypertension: Secondary | ICD-10-CM | POA: Diagnosis not present

## 2019-08-17 DIAGNOSIS — Z87891 Personal history of nicotine dependence: Secondary | ICD-10-CM | POA: Diagnosis not present

## 2019-08-17 DIAGNOSIS — Z6841 Body Mass Index (BMI) 40.0 and over, adult: Secondary | ICD-10-CM | POA: Diagnosis not present

## 2019-08-17 DIAGNOSIS — Z79899 Other long term (current) drug therapy: Secondary | ICD-10-CM | POA: Diagnosis not present

## 2019-08-17 DIAGNOSIS — N736 Female pelvic peritoneal adhesions (postinfective): Secondary | ICD-10-CM | POA: Diagnosis not present

## 2019-08-17 HISTORY — DX: Malignant neoplasm of endometrium: C54.1

## 2019-08-17 HISTORY — DX: Gout, unspecified: M10.9

## 2019-08-17 HISTORY — DX: Carpal tunnel syndrome, unspecified upper limb: G56.00

## 2019-08-17 HISTORY — DX: Gastro-esophageal reflux disease without esophagitis: K21.9

## 2019-08-17 HISTORY — DX: Hyperlipidemia, unspecified: E78.5

## 2019-08-17 HISTORY — DX: Personal history of other endocrine, nutritional and metabolic disease: Z86.39

## 2019-08-17 HISTORY — DX: Unspecified malignant neoplasm of skin, unspecified: C44.90

## 2019-08-17 LAB — URINALYSIS, ROUTINE W REFLEX MICROSCOPIC
Bilirubin Urine: NEGATIVE
Glucose, UA: NEGATIVE mg/dL
Hgb urine dipstick: NEGATIVE
Ketones, ur: NEGATIVE mg/dL
Nitrite: NEGATIVE
Protein, ur: NEGATIVE mg/dL
Specific Gravity, Urine: 1.012 (ref 1.005–1.030)
pH: 5 (ref 5.0–8.0)

## 2019-08-17 LAB — COMPREHENSIVE METABOLIC PANEL
ALT: 19 U/L (ref 0–44)
AST: 22 U/L (ref 15–41)
Albumin: 4.2 g/dL (ref 3.5–5.0)
Alkaline Phosphatase: 118 U/L (ref 38–126)
Anion gap: 7 (ref 5–15)
BUN: 20 mg/dL (ref 8–23)
CO2: 27 mmol/L (ref 22–32)
Calcium: 9.7 mg/dL (ref 8.9–10.3)
Chloride: 106 mmol/L (ref 98–111)
Creatinine, Ser: 0.91 mg/dL (ref 0.44–1.00)
GFR calc Af Amer: >60 mL/min (ref >60)
GFR calc non Af Amer: >60 mL/min (ref >60)
Glucose, Bld: 161 mg/dL — ABNORMAL HIGH (ref 70–99)
Potassium: 4.3 mmol/L (ref 3.5–5.1)
Sodium: 140 mmol/L (ref 135–145)
Total Bilirubin: 0.6 mg/dL (ref 0.3–1.2)
Total Protein: 7.3 g/dL (ref 6.5–8.1)

## 2019-08-17 LAB — SARS CORONAVIRUS 2 (TAT 6-24 HRS): SARS Coronavirus 2: NEGATIVE

## 2019-08-17 LAB — CBC
HCT: 35.8 % — ABNORMAL LOW (ref 36.0–46.0)
Hemoglobin: 11.6 g/dL — ABNORMAL LOW (ref 12.0–15.0)
MCH: 30.9 pg (ref 26.0–34.0)
MCHC: 32.4 g/dL (ref 30.0–36.0)
MCV: 95.5 fL (ref 80.0–100.0)
Platelets: 203 10*3/uL (ref 150–400)
RBC: 3.75 MIL/uL — ABNORMAL LOW (ref 3.87–5.11)
RDW: 14.6 % (ref 11.5–15.5)
WBC: 5.9 10*3/uL (ref 4.0–10.5)
nRBC: 0 % (ref 0.0–0.2)

## 2019-08-17 LAB — HEMOGLOBIN A1C
Hgb A1c MFr Bld: 5.4 % (ref 4.8–5.6)
Mean Plasma Glucose: 108.28 mg/dL

## 2019-08-17 LAB — GLUCOSE, CAPILLARY: Glucose-Capillary: 196 mg/dL — ABNORMAL HIGH (ref 70–99)

## 2019-08-17 NOTE — Progress Notes (Signed)
SPOKE W/  [name]     SCREENING SYMPTOMS OF COVID 19:   COUGH--NO  RUNNY NOSE--- NO  SORE THROAT---NO  NASAL CONGESTION----NO  SNEEZING----NO  SHORTNESS OF BREATH---NO  DIFFICULTY BREATHING---NO  TEMP >100.0 -----NO NO UNEXPLAINED BODY ACHES------  CHILLS -------- NO  HEADACHES ---------NO  LOSS OF SMELL/ TASTE --------NO    HAVE YOU OR ANY FAMILY MEMBER TRAVELLED PAST 14 DAYS OUT OF THE   COUNTY---Live in [facility] STATE----NO COUNTRY----NO  HAVE YOU OR ANY FAMILY MEMBER BEEN EXPOSED TO ANYONE WITH COVID 19? NO

## 2019-08-20 ENCOUNTER — Encounter (HOSPITAL_COMMUNITY)
Admission: RE | Disposition: A | Discharge status: Home / Self Care | Payer: Self-pay | Source: Home / Self Care | Attending: Gynecologic Oncology

## 2019-08-20 ENCOUNTER — Other Ambulatory Visit: Payer: Self-pay

## 2019-08-20 ENCOUNTER — Ambulatory Visit (HOSPITAL_COMMUNITY): Payer: Medicare Other | Admitting: Anesthesiology

## 2019-08-20 ENCOUNTER — Ambulatory Visit (HOSPITAL_COMMUNITY)
Admission: RE | Admit: 2019-08-20 | Discharge: 2019-08-20 | Disposition: A | Discharge status: Home / Self Care | Payer: Medicare Other | Attending: Gynecologic Oncology | Admitting: Gynecologic Oncology

## 2019-08-20 ENCOUNTER — Ambulatory Visit (HOSPITAL_COMMUNITY): Payer: Medicare Other | Admitting: Physician Assistant

## 2019-08-20 ENCOUNTER — Encounter (HOSPITAL_COMMUNITY): Payer: Self-pay | Admitting: Registered Nurse

## 2019-08-20 DIAGNOSIS — Z87891 Personal history of nicotine dependence: Secondary | ICD-10-CM | POA: Insufficient documentation

## 2019-08-20 DIAGNOSIS — Z794 Long term (current) use of insulin: Secondary | ICD-10-CM | POA: Insufficient documentation

## 2019-08-20 DIAGNOSIS — Z538 Procedure and treatment not carried out for other reasons: Secondary | ICD-10-CM | POA: Insufficient documentation

## 2019-08-20 DIAGNOSIS — Z6841 Body Mass Index (BMI) 40.0 and over, adult: Secondary | ICD-10-CM

## 2019-08-20 DIAGNOSIS — K219 Gastro-esophageal reflux disease without esophagitis: Secondary | ICD-10-CM | POA: Diagnosis not present

## 2019-08-20 DIAGNOSIS — E785 Hyperlipidemia, unspecified: Secondary | ICD-10-CM | POA: Diagnosis not present

## 2019-08-20 DIAGNOSIS — Z79899 Other long term (current) drug therapy: Secondary | ICD-10-CM | POA: Diagnosis not present

## 2019-08-20 DIAGNOSIS — N736 Female pelvic peritoneal adhesions (postinfective): Secondary | ICD-10-CM | POA: Diagnosis not present

## 2019-08-20 DIAGNOSIS — Z803 Family history of malignant neoplasm of breast: Secondary | ICD-10-CM | POA: Insufficient documentation

## 2019-08-20 DIAGNOSIS — Z7982 Long term (current) use of aspirin: Secondary | ICD-10-CM | POA: Insufficient documentation

## 2019-08-20 DIAGNOSIS — Z20828 Contact with and (suspected) exposure to other viral communicable diseases: Secondary | ICD-10-CM | POA: Diagnosis not present

## 2019-08-20 DIAGNOSIS — I10 Essential (primary) hypertension: Secondary | ICD-10-CM | POA: Insufficient documentation

## 2019-08-20 DIAGNOSIS — Z8049 Family history of malignant neoplasm of other genital organs: Secondary | ICD-10-CM | POA: Diagnosis not present

## 2019-08-20 DIAGNOSIS — Z8542 Personal history of malignant neoplasm of other parts of uterus: Secondary | ICD-10-CM | POA: Diagnosis present

## 2019-08-20 DIAGNOSIS — K660 Peritoneal adhesions (postprocedural) (postinfection): Secondary | ICD-10-CM | POA: Diagnosis not present

## 2019-08-20 DIAGNOSIS — C541 Malignant neoplasm of endometrium: Secondary | ICD-10-CM | POA: Diagnosis not present

## 2019-08-20 DIAGNOSIS — Z85828 Personal history of other malignant neoplasm of skin: Secondary | ICD-10-CM | POA: Insufficient documentation

## 2019-08-20 DIAGNOSIS — E119 Type 2 diabetes mellitus without complications: Secondary | ICD-10-CM | POA: Insufficient documentation

## 2019-08-20 HISTORY — PX: ROBOTIC ASSISTED TOTAL HYSTERECTOMY WITH BILATERAL SALPINGO OOPHERECTOMY: SHX6086

## 2019-08-20 LAB — GLUCOSE, CAPILLARY
Glucose-Capillary: 142 mg/dL — ABNORMAL HIGH (ref 70–99)
Glucose-Capillary: 86 mg/dL (ref 70–99)

## 2019-08-20 SURGERY — HYSTERECTOMY, TOTAL, ROBOT-ASSISTED, LAPAROSCOPIC, WITH BILATERAL SALPINGO-OOPHORECTOMY
Anesthesia: General

## 2019-08-20 MED ORDER — ENOXAPARIN SODIUM 40 MG/0.4ML ~~LOC~~ SOLN
40.0000 mg | SUBCUTANEOUS | Status: AC
  Administered 2019-08-20: 09:00:00 40 mg via SUBCUTANEOUS
  Filled 2019-08-20: qty 0.4

## 2019-08-20 MED ORDER — SODIUM CHLORIDE 0.9% FLUSH: 3.0000 mL | INTRAVENOUS | Status: DC | PRN

## 2019-08-20 MED ORDER — ROCURONIUM BROMIDE 10 MG/ML (PF) SYRINGE
PREFILLED_SYRINGE | INTRAVENOUS | Status: AC
  Filled 2019-08-20: qty 10

## 2019-08-20 MED ORDER — MIDAZOLAM HCL 5 MG/5ML IJ SOLN
INTRAMUSCULAR | Status: DC | PRN
  Administered 2019-08-20 (×2): 1 mg via INTRAVENOUS

## 2019-08-20 MED ORDER — LIDOCAINE 2% (20 MG/ML) 5 ML SYRINGE
INTRAMUSCULAR | Status: DC | PRN
  Administered 2019-08-20: 1.5 mg/kg/h via INTRAVENOUS

## 2019-08-20 MED ORDER — MIDAZOLAM HCL 2 MG/2ML IJ SOLN
INTRAMUSCULAR | Status: AC
  Filled 2019-08-20: qty 2

## 2019-08-20 MED ORDER — FENTANYL CITRATE (PF) 100 MCG/2ML IJ SOLN
INTRAMUSCULAR | Status: DC | PRN
  Administered 2019-08-20 (×2): 100 ug via INTRAVENOUS
  Administered 2019-08-20 (×2): 50 ug via INTRAVENOUS

## 2019-08-20 MED ORDER — FENTANYL CITRATE (PF) 250 MCG/5ML IJ SOLN
INTRAMUSCULAR | Status: AC
  Filled 2019-08-20: qty 5

## 2019-08-20 MED ORDER — ACETAMINOPHEN 650 MG RE SUPP
650.0000 mg | RECTAL | Status: DC | PRN
  Filled 2019-08-20: qty 1

## 2019-08-20 MED ORDER — SODIUM CHLORIDE 0.9 % IV SOLN
2.0000 g | INTRAVENOUS | Status: AC
  Administered 2019-08-20: 2 g via INTRAVENOUS
  Filled 2019-08-20: qty 2

## 2019-08-20 MED ORDER — STERILE WATER FOR INJECTION IJ SOLN
INTRAMUSCULAR | Status: AC
  Filled 2019-08-20: qty 10

## 2019-08-20 MED ORDER — BUPIVACAINE HCL (PF) 0.25 % IJ SOLN
INTRAMUSCULAR | Status: AC
  Filled 2019-08-20: qty 30

## 2019-08-20 MED ORDER — LIDOCAINE 2% (20 MG/ML) 5 ML SYRINGE
INTRAMUSCULAR | Status: DC | PRN
  Administered 2019-08-20: 25 mg via INTRAVENOUS
  Administered 2019-08-20: 75 mg via INTRAVENOUS

## 2019-08-20 MED ORDER — LACTATED RINGERS IV SOLN
INTRAVENOUS | Status: DC
  Administered 2019-08-20: 09:00:00 via INTRAVENOUS

## 2019-08-20 MED ORDER — KETAMINE HCL 10 MG/ML IJ SOLN
INTRAMUSCULAR | Status: DC | PRN
  Administered 2019-08-20: 50 mg via INTRAVENOUS

## 2019-08-20 MED ORDER — ALVIMOPAN 12 MG PO CAPS
12.0000 mg | ORAL_CAPSULE | Freq: Once | ORAL | Status: AC
  Administered 2019-08-20: 09:00:00 12 mg via ORAL
  Filled 2019-08-20: qty 1

## 2019-08-20 MED ORDER — ACETAMINOPHEN 500 MG PO TABS
1000.0000 mg | ORAL_TABLET | ORAL | Status: AC
  Administered 2019-08-20: 09:00:00 1000 mg via ORAL
  Filled 2019-08-20: qty 2

## 2019-08-20 MED ORDER — SUCCINYLCHOLINE CHLORIDE 200 MG/10ML IV SOSY
PREFILLED_SYRINGE | INTRAVENOUS | Status: AC
  Filled 2019-08-20: qty 10

## 2019-08-20 MED ORDER — FENTANYL CITRATE (PF) 100 MCG/2ML IJ SOLN: 25.0000 ug | INTRAMUSCULAR | Status: DC | PRN

## 2019-08-20 MED ORDER — GABAPENTIN 300 MG PO CAPS
300.0000 mg | ORAL_CAPSULE | ORAL | Status: AC
  Administered 2019-08-20: 300 mg via ORAL
  Filled 2019-08-20: qty 1

## 2019-08-20 MED ORDER — DIPHENHYDRAMINE HCL 50 MG/ML IJ SOLN
INTRAMUSCULAR | Status: AC
  Filled 2019-08-20: qty 1

## 2019-08-20 MED ORDER — SODIUM CHLORIDE 0.9 % IV SOLN: 250.0000 mL | INTRAVENOUS | Status: DC | PRN

## 2019-08-20 MED ORDER — PROMETHAZINE HCL 25 MG/ML IJ SOLN: 6.2500 mg | INTRAMUSCULAR | Status: DC | PRN

## 2019-08-20 MED ORDER — PROPOFOL 10 MG/ML IV BOLUS
INTRAVENOUS | Status: DC | PRN
  Administered 2019-08-20: 160 mg via INTRAVENOUS

## 2019-08-20 MED ORDER — SCOPOLAMINE 1 MG/3DAYS TD PT72
1.0000 | MEDICATED_PATCH | TRANSDERMAL | Status: DC
  Administered 2019-08-20: 09:00:00 1.5 mg via TRANSDERMAL
  Filled 2019-08-20: qty 1

## 2019-08-20 MED ORDER — LIDOCAINE 2% (20 MG/ML) 5 ML SYRINGE
INTRAMUSCULAR | Status: AC
  Filled 2019-08-20: qty 5

## 2019-08-20 MED ORDER — DIPHENHYDRAMINE HCL 50 MG/ML IJ SOLN
INTRAMUSCULAR | Status: DC | PRN
  Administered 2019-08-20: 12.5 mg via INTRAVENOUS

## 2019-08-20 MED ORDER — INDOCYANINE GREEN 25 MG IV SOLR
INTRAVENOUS | Status: DC | PRN
  Administered 2019-08-20: 2.5 mg

## 2019-08-20 MED ORDER — DEXAMETHASONE SODIUM PHOSPHATE 10 MG/ML IJ SOLN
INTRAMUSCULAR | Status: AC
  Filled 2019-08-20: qty 1

## 2019-08-20 MED ORDER — CELECOXIB 200 MG PO CAPS
400.0000 mg | ORAL_CAPSULE | Freq: Once | ORAL | Status: AC
  Administered 2019-08-20: 09:00:00 400 mg via ORAL
  Filled 2019-08-20: qty 2

## 2019-08-20 MED ORDER — ACETAMINOPHEN 325 MG PO TABS: 650.0000 mg | ORAL_TABLET | ORAL | Status: DC | PRN

## 2019-08-20 MED ORDER — SODIUM CHLORIDE 0.9% FLUSH: 3.0000 mL | Freq: Two times a day (BID) | INTRAVENOUS | Status: DC

## 2019-08-20 MED ORDER — ONDANSETRON HCL 4 MG/2ML IJ SOLN
INTRAMUSCULAR | Status: DC | PRN
  Administered 2019-08-20: 4 mg via INTRAVENOUS

## 2019-08-20 MED ORDER — PROPOFOL 10 MG/ML IV BOLUS
INTRAVENOUS | Status: AC
  Filled 2019-08-20: qty 20

## 2019-08-20 MED ORDER — DEXAMETHASONE SODIUM PHOSPHATE 10 MG/ML IJ SOLN
INTRAMUSCULAR | Status: DC | PRN
  Administered 2019-08-20: 4 mg via INTRAVENOUS

## 2019-08-20 MED ORDER — ROCURONIUM BROMIDE 10 MG/ML (PF) SYRINGE
PREFILLED_SYRINGE | INTRAVENOUS | Status: DC | PRN
  Administered 2019-08-20: 60 mg via INTRAVENOUS

## 2019-08-20 MED ORDER — LABETALOL HCL 5 MG/ML IV SOLN
INTRAVENOUS | Status: DC | PRN
  Administered 2019-08-20: 5 mg via INTRAVENOUS

## 2019-08-20 MED ORDER — SUCCINYLCHOLINE CHLORIDE 200 MG/10ML IV SOSY
PREFILLED_SYRINGE | INTRAVENOUS | Status: DC | PRN
  Administered 2019-08-20: 120 mg via INTRAVENOUS

## 2019-08-20 MED ORDER — ONDANSETRON HCL 4 MG/2ML IJ SOLN
INTRAMUSCULAR | Status: AC
  Filled 2019-08-20: qty 2

## 2019-08-20 MED ORDER — OXYCODONE HCL 5 MG PO TABS: 5.0000 mg | ORAL_TABLET | ORAL | Status: DC | PRN

## 2019-08-20 MED ORDER — SUGAMMADEX SODIUM 500 MG/5ML IV SOLN
INTRAVENOUS | Status: DC | PRN
  Administered 2019-08-20: 600 mg via INTRAVENOUS

## 2019-08-20 MED ORDER — STERILE WATER FOR INJECTION IJ SOLN
INTRAMUSCULAR | Status: DC | PRN
  Administered 2019-08-20: 4 mL

## 2019-08-20 MED ORDER — BUPIVACAINE HCL 0.25 % IJ SOLN
INTRAMUSCULAR | Status: DC | PRN
  Administered 2019-08-20: 5 mL

## 2019-08-20 MED ORDER — LACTATED RINGERS IV SOLN
INTRAVENOUS | Status: DC | PRN
  Administered 2019-08-20 (×2): via INTRAVENOUS

## 2019-08-20 MED ORDER — SUGAMMADEX SODIUM 500 MG/5ML IV SOLN
INTRAVENOUS | Status: AC
  Filled 2019-08-20: qty 5

## 2019-08-20 MED ORDER — NON FORMULARY
Status: DC | PRN
  Administered 2019-08-20: 52 mg

## 2019-08-20 MED ORDER — DEXAMETHASONE SODIUM PHOSPHATE 4 MG/ML IJ SOLN: 4.0000 mg | INTRAMUSCULAR | Status: DC

## 2019-08-20 SURGICAL SUPPLY — 53 items
APPLICATOR SURGIFLO ENDO (HEMOSTASIS) IMPLANT
BAG LAPAROSCOPIC 12 15 PORT 16 (BASKET) IMPLANT
BAG RETRIEVAL 12/15 (BASKET)
COVER BACK TABLE 60X90IN (DRAPES) ×3 IMPLANT
COVER TIP SHEARS 8 DVNC (MISCELLANEOUS) ×2 IMPLANT
COVER TIP SHEARS 8MM DA VINCI (MISCELLANEOUS)
COVER WAND RF STERILE (DRAPES) IMPLANT
DECANTER SPIKE VIAL GLASS SM (MISCELLANEOUS) ×2 IMPLANT
DERMABOND ADVANCED (GAUZE/BANDAGES/DRESSINGS) ×1
DERMABOND ADVANCED .7 DNX12 (GAUZE/BANDAGES/DRESSINGS) ×2 IMPLANT
DRAPE ARM DVNC X/XI (DISPOSABLE) ×8 IMPLANT
DRAPE COLUMN DVNC XI (DISPOSABLE) ×2 IMPLANT
DRAPE DA VINCI XI ARM (DISPOSABLE) ×4
DRAPE DA VINCI XI COLUMN (DISPOSABLE) ×1
DRAPE SHEET LG 3/4 BI-LAMINATE (DRAPES) ×3 IMPLANT
DRAPE SURG IRRIG POUCH 19X23 (DRAPES) ×3 IMPLANT
ELECT REM PT RETURN 15FT ADLT (MISCELLANEOUS) ×3 IMPLANT
GLOVE BIO SURGEON STRL SZ 6 (GLOVE) ×12 IMPLANT
GLOVE BIO SURGEON STRL SZ 6.5 (GLOVE) ×2 IMPLANT
GOWN STRL REUS W/ TWL LRG LVL3 (GOWN DISPOSABLE) ×8 IMPLANT
GOWN STRL REUS W/TWL LRG LVL3 (GOWN DISPOSABLE) ×4
HOLDER FOLEY CATH W/STRAP (MISCELLANEOUS) ×2 IMPLANT
IRRIG SUCT STRYKERFLOW 2 WTIP (MISCELLANEOUS) ×3
IRRIGATION SUCT STRKRFLW 2 WTP (MISCELLANEOUS) ×2 IMPLANT
KIT PROCEDURE DA VINCI SI (MISCELLANEOUS) ×1
KIT PROCEDURE DVNC SI (MISCELLANEOUS) IMPLANT
KIT TURNOVER KIT A (KITS) ×1 IMPLANT
MANIPULATOR UTERINE 4.5 ZUMI (MISCELLANEOUS) ×3 IMPLANT
NDL SPNL 18GX3.5 QUINCKE PK (NEEDLE) IMPLANT
NEEDLE HYPO 22GX1.5 SAFETY (NEEDLE) ×1 IMPLANT
NEEDLE SPNL 18GX3.5 QUINCKE PK (NEEDLE) ×3 IMPLANT
OBTURATOR OPTICAL STANDARD 8MM (TROCAR) ×1
OBTURATOR OPTICAL STND 8 DVNC (TROCAR) ×2
OBTURATOR OPTICALSTD 8 DVNC (TROCAR) ×2 IMPLANT
PACK ROBOT GYN CUSTOM WL (TRAY / TRAY PROCEDURE) ×3 IMPLANT
PAD POSITIONING PINK XL (MISCELLANEOUS) ×3 IMPLANT
PAD TELFA 2X3 NADH STRL (GAUZE/BANDAGES/DRESSINGS) ×1 IMPLANT
PORT ACCESS TROCAR AIRSEAL 12 (TROCAR) ×2 IMPLANT
PORT ACCESS TROCAR AIRSEAL 5M (TROCAR) ×1
POUCH SPECIMEN RETRIEVAL 10MM (ENDOMECHANICALS) IMPLANT
SEAL CANN UNIV 5-8 DVNC XI (MISCELLANEOUS) ×6 IMPLANT
SEAL XI 5MM-8MM UNIVERSAL (MISCELLANEOUS) ×3
SET TRI-LUMEN FLTR TB AIRSEAL (TUBING) ×3 IMPLANT
SURGIFLO W/THROMBIN 8M KIT (HEMOSTASIS) IMPLANT
SUT VIC AB 0 CT1 27 (SUTURE)
SUT VIC AB 0 CT1 27XBRD ANTBC (SUTURE) IMPLANT
SUT VIC AB 4-0 PS2 18 (SUTURE) ×4 IMPLANT
SYR 10ML LL (SYRINGE) ×1 IMPLANT
TOWEL OR NON WOVEN STRL DISP B (DISPOSABLE) ×3 IMPLANT
TRAP SPECIMEN MUCOUS 40CC (MISCELLANEOUS) IMPLANT
TRAY FOLEY MTR SLVR 16FR STAT (SET/KITS/TRAYS/PACK) ×3 IMPLANT
UNDERPAD 30X30 (UNDERPADS AND DIAPERS) ×3 IMPLANT
WATER STERILE IRR 1000ML POUR (IV SOLUTION) ×3 IMPLANT

## 2019-08-20 NOTE — Op Note (Signed)
OPERATIVE NOTE 08/20/19  Surgeon: Donaciano Eva   Assistants: Dr Lahoma Crocker (an MD assistant was necessary for tissue manipulation, management of robotic instrumentation, retraction and positioning due to the complexity of the case and hospital policies).   Anesthesia: General endotracheal anesthesia  ASA Class: 3   Pre-operative Diagnosis: endometrial cancer grade 1, morbid obesity, dense abdominal and pelvic adhesive disease  Post-operative Diagnosis: same,   Operation: Diagnostic laparoscopy, D&C, placement of Mirena IUD  Surgeon: Donaciano Eva  Assistant Surgeon: Lahoma Crocker MD  Anesthesia: GET  Urine Output: 100cc  Operative Findings:  : Morbid obesity with a BMI of 50 kg/m.  Upon entry laparoscopically there was a wall of dense adhesions between the colon, small intestine, stomach and anterior abdominal wall.  No visualization of pelvic viscera was possible.  Uterus sounded to 10 cm.  Moderate tissue retrieved through D&C.  Estimated Blood Loss:  Minimal      Total IV Fluids: 800 ml         Specimens: endometrial cancer         Complications:  None; patient tolerated the procedure well.         Disposition: PACU - hemodynamically stable.  Procedure Details  The patient was seen in the Holding Room. The risks, benefits, complications, treatment options, and expected outcomes were discussed with the patient.  The patient concurred with the proposed plan, giving informed consent.  The site of surgery properly noted/marked. The patient was identified as [name] and the procedure verified as a Robotic-assisted hysterectomy with bilateral salpingo oophorectomy with SLN biopsy, possible D&C with IUD placement. A Time Out was held and the above information confirmed.  After induction of anesthesia, the patient was draped and prepped in the usual sterile manner. Pt was placed in supine position after anesthesia and draped and prepped in the  usual sterile manner. The abdominal drape was placed after the CholoraPrep had been allowed to dry for 3 minutes.  Her arms were tucked to her side with all appropriate precautions.  The shoulders were stabilized with padded shoulder blocks applied to the acromium processes.  The patient was placed in the semi-lithotomy position in [place].  The perineum was prepped with Betadine. The patient was then prepped. Foley catheter was placed.  A sterile speculum was placed in the vagina.  The cervix was grasped with a single-tooth tenaculum. 2mg  total of ICG was injected into the cervical stroma at 2 and 9 o'clock with 1cc injected at a 1cm and 62mm depth (concentration 0.5mg /ml) in all locations. The cervix was dilated with Kennon Rounds dilators.  The ZUMI uterine manipulator with a medium colpotomizer ring was placed without difficulty.  A pneum occluder balloon was placed over the manipulator.  OG tube placement was confirmed and to suction.   Next, a 5 mm skin incision was made 1 cm below the subcostal margin in the midclavicular line.  The 5 mm Optiview port and scope was used for direct entry.  Opening pressure was under 10 mm CO2.  The abdomen was insufflated and the findings were noted as above.  There was a dense wall of adhesions between the colon, small intestine, omentum, and stomach to the anterior abdominal wall.  Entry into the peritoneal cavity was directed into the lesser sac.  The scope was circulated 360 degrees to confirm that the through and through bowel injury had not occurred.  The scope was attempted to be directed through some of the adhesive disease to  potentially visualize the pelvic structures.  Due to dense adhesions between bowel and the anterior abdominal wall this was not possible.  Given that the patient had specifically desired avoidance of laparotomy to complete her procedure (because she had had complications resulting from prior laparotomies), we proceeded with avoiding the  laparoscopic and robotic portion of the procedures and instead performed a D&C with Mirena IUD placement.  The scope was removed and the abdomen was desufflated.  The 5 mm port site was closed with 4-0 Vicryl and 4-0 Monocryl and Dermabond.  The speculum was reinserted into the vagina.  The uterine manipulator was removed.  The cervix was grasped with a single-tooth tenaculum.  The cervix was dilated with the [facility] dilators.  The uterus was sounded to 10 cm.  Comprehensive complete endometrial curettage with a sharp curette was performed.  A gritty feel was noted throughout the subendometrial layer.  The Mirena, progestin releasing, IUD was opened.  It was deployed in an aseptic technique to the uterine fundus.  Strings were cut at 3 inches.  The tenaculum and speculum were removed.  There was no bleeding identified.  The patient was awoken from general anesthesia and transferred to the recovery room in stable condition.  We will sample her endometrial cavity in the office in approximately 3 months time.  If there is residual carcinoma still present at this time we will reconsider options for surgical intervention.    Donaciano Eva, MD

## 2019-08-20 NOTE — Anesthesia Postprocedure Evaluation (Signed)
Anesthesia Post Note  Patient: [name]  Procedure(s) Performed: DIAGNSOTIC LAPAROSCOPY, DILATION AND CURETTAGE, IUD PLACEMENT  (N/A )     Patient location during evaluation: PACU Anesthesia Type: General Level of consciousness: sedated Pain management: pain level controlled Vital Signs Assessment: post-procedure vital signs reviewed and stable Respiratory status: spontaneous breathing and respiratory function stable Cardiovascular status: stable Postop Assessment: no apparent nausea or vomiting Anesthetic complications: no    Last Vitals:  Vitals:   08/20/19 1245 08/20/19 1308  BP: (!) 179/81 (!) 188/92  Pulse: 69   Resp: 11 14  Temp:    SpO2: 97% 96%    Last Pain:  Vitals:   08/20/19 1230  TempSrc:   PainSc: 2                  Mehtab Dolberry DANIEL

## 2019-08-20 NOTE — Anesthesia Preprocedure Evaluation (Addendum)
Anesthesia Evaluation  Patient identified by MRN, date of birth, ID band Patient awake    Reviewed: Allergy & Precautions, NPO status , Patient's Chart, lab work & pertinent test results  History of Anesthesia Complications Negative for: history of anesthetic complications  Airway Mallampati: III  TM Distance: >3 FB Neck ROM: Full    Dental no notable dental hx. (+) Dental Advisory Given   Pulmonary neg shortness of breath, neg COPD, neg recent URI, former smoker,    Pulmonary exam normal        Cardiovascular hypertension, Pt. on medications (-) angina(-) Past MI, (-) CHF and (-) DVT Normal cardiovascular exam Rhythm:Regular     Neuro/Psych negative neurological ROS  negative psych ROS   GI/Hepatic Neg liver ROS, GERD  ,  Endo/Other  diabetes, Insulin DependentMorbid obesity  Renal/GU negative Renal ROS  negative genitourinary   Musculoskeletal  (+) Arthritis ,   Abdominal   Peds negative pediatric ROS (+)  Hematology negative hematology ROS (+)   Anesthesia Other Findings   Reproductive/Obstetrics                            Anesthesia Physical  Anesthesia Plan  ASA: III  Anesthesia Plan: General   Post-op Pain Management:    Induction: Intravenous  PONV Risk Score and Plan: 4 or greater and Ondansetron, Dexamethasone, Midazolam and Diphenhydramine  Airway Management Planned: Oral ETT  Additional Equipment: None  Intra-op Plan:   Post-operative Plan: Extubation in OR  Informed Consent: I have reviewed the patients History and Physical, chart, labs and discussed the procedure including the risks, benefits and alternatives for the proposed anesthesia with the patient or authorized representative who has indicated his/her understanding and acceptance.     Dental advisory given  Plan Discussed with: Anesthesiologist and CRNA  Anesthesia Plan Comments:         Anesthesia Quick Evaluation

## 2019-08-20 NOTE — Anesthesia Procedure Notes (Signed)
Procedure Name: Intubation Date/Time: 08/20/2019 10:20 AM Performed by: Lissa Morales, CRNA Pre-anesthesia Checklist: Patient identified, Emergency Drugs available, Suction available and Patient being monitored Patient Re-evaluated:Patient Re-evaluated prior to induction Oxygen Delivery Method: Circle system utilized Preoxygenation: Pre-oxygenation with 100% oxygen Induction Type: IV induction Laryngoscope Size: Mac and 4 Grade View: Grade I Tube type: Oral Tube size: 7.0 mm Number of attempts: 1 Airway Equipment and Method: Stylet and Oral airway Placement Confirmation: ETT inserted through vocal cords under direct vision,  positive ETCO2 and breath sounds checked- equal and bilateral Secured at: 21 cm Tube secured with: Tape Dental Injury: Teeth and Oropharynx as per pre-operative assessment

## 2019-08-20 NOTE — Discharge Instructions (Addendum)
Dilation and Curettage Care After These instructions give you information about caring for yourself after your procedure. Your doctor may also give you more specific instructions. Call your doctor if you have any problems or questions after your procedure. Follow these instructions at home: Activity  Do not drive or use heavy machinery while taking prescription pain medicine.  For 24 hours after your procedure, avoid driving.  Take short walks often, followed by rest periods. Ask your doctor what activities are safe for you. After one or two days, you may be able to return to your normal activities.  Do not lift anything that is heavier than 10 lb (4.5 kg) until your doctor approves.  For at least 2 weeks, or as long as told by your doctor: ? Do not douche. ? Do not use tampons. ? Do not have sex. General instructions   Take over-the-counter and prescription medicines only as told by your doctor. This is very important if you take blood thinning medicine.  Do not take baths, swim, or use a hot tub until your doctor approves. Take showers instead of baths.  Wear compression stockings as told by your doctor.  It is up to you to get the results of your procedure. Ask your doctor when your results will be ready.  Keep all follow-up visits as told by your doctor. This is important. Contact a doctor if:  You have very bad cramps that get worse or do not get better with medicine.  You have very bad pain in your belly (abdomen).  You cannot drink fluids without throwing up (vomiting).  You get pain in a different part of the area between your belly and thighs (pelvis).  You have bad-smelling discharge from your vagina.  You have a rash. Get help right away if:  You are bleeding a lot from your vagina. A lot of bleeding means soaking more than one sanitary pad in an hour, for 2 hours in a row.  You have clumps of blood (blood clots) coming from your vagina.  You have a fever or  chills.  Your belly feels very tender or hard.  You have chest pain.  You have trouble breathing.  You cough up blood.  You feel dizzy.  You feel light-headed.  You pass out (faint).  You have pain in your neck or shoulder area. Summary  Take short walks often, followed by rest periods. Ask your doctor what activities are safe for you. After one or two days, you may be able to return to your normal activities.  Do not lift anything that is heavier than 10 lb (4.5 kg) until your doctor approves.  Do not take baths, swim, or use a hot tub until your doctor approves. Take showers instead of baths.  Contact your doctor if you have any symptoms of infection, like bad-smelling discharge from your vagina.  During our office hours call [phone].  After hours please call [phone] to reach the on-call provider. This information is not intended to replace advice given to you by your health care provider. Make sure you discuss any questions you have with your health care provider. Document Released: 09/25/2008 Document Revised: 11/29/2017 Document Reviewed: 09/03/2016 Elsevier Patient Education  2020 [employer].

## 2019-08-20 NOTE — Interval H&P Note (Signed)
History and Physical Interval Note:  08/20/2019 9:57 AM  [name]  has presented today for surgery, with the diagnosis of ENDOMETRIAL CANCER.  The various methods of treatment have been discussed with the patient and family. After consideration of risks, benefits and other options for treatment, the patient has consented to  Procedure(s): XI ROBOTIC ASSISTED TOTAL HYSTERECTOMY WITH BILATERAL SALPINGO OOPHORECTOMY (Bilateral) LYMPH NODE BIOPSY, POSSIBLE DILATION AND CURRETAGE OF UTERUS WITH MIRENA IUD PLACEMENT, POSSIBLE LAPAROTOMY, POSSIBLE BOWEL RESECTION (Bilateral) as a surgical intervention.  [name] history has been reviewed, patient examined, no change in status, stable for surgery.  I have reviewed the patient's chart and labs.  Questions were answered to the patient's satisfaction.     Thereasa Solo

## 2019-08-20 NOTE — Transfer of Care (Signed)
Immediate Anesthesia Transfer of Care Note  Patient: [name]  Procedure(s) Performed: DIAGNSOTIC LAPAROSCOPY, DILATION AND CURETTAGE, IUD PLACEMENT  (N/A )  Patient Location: PACU  Anesthesia Type:General  Level of Consciousness: awake, alert , oriented and patient cooperative  Airway & Oxygen Therapy: Patient Spontanous Breathing and Patient connected to face mask oxygen  Post-op Assessment: Report given to RN, Post -op Vital signs reviewed and stable and Patient moving all extremities X 4  Post vital signs: stable  Last Vitals:  Vitals Value Taken Time  BP 174/70 08/20/19 1126  Temp 36.4 C 08/20/19 1126  Pulse 77 08/20/19 1130  Resp 13 08/20/19 1130  SpO2 100 % 08/20/19 1130  Vitals shown include unvalidated device data.  Last Pain:  Vitals:   08/20/19 1126  TempSrc:   PainSc: 0-No pain         Complications: No apparent anesthesia complications

## 2019-08-21 ENCOUNTER — Telehealth: Payer: Self-pay

## 2019-08-21 ENCOUNTER — Encounter (HOSPITAL_COMMUNITY): Payer: Self-pay | Admitting: Gynecologic Oncology

## 2019-08-21 LAB — TYPE AND SCREEN
ABO/RH(D): O POS
Antibody Screen: POSITIVE
Donor AG Type: NEGATIVE
Donor AG Type: NEGATIVE
Unit division: 0
Unit division: 0

## 2019-08-21 LAB — BPAM RBC
Blood Product Expiration Date: 202009142359
Blood Product Expiration Date: 202009142359
Unit Type and Rh: 5100
Unit Type and Rh: 5100

## 2019-08-21 NOTE — Telephone Encounter (Signed)
I spoke with Ms Eddinger this am. She is feeling well. No pain or cramping. No n/v. She is having a small amount of discharge.  I told patient her belongings she left at the hospital yesterday are in this office. She will have her daughter pick them up on Monday. I encouraged patient to call this office with any problems or questions. She verbalized understanding.

## 2019-08-24 DIAGNOSIS — M109 Gout, unspecified: Secondary | ICD-10-CM | POA: Diagnosis not present

## 2019-08-24 DIAGNOSIS — R110 Nausea: Secondary | ICD-10-CM | POA: Diagnosis not present

## 2019-08-24 DIAGNOSIS — G4700 Insomnia, unspecified: Secondary | ICD-10-CM | POA: Diagnosis not present

## 2019-08-24 DIAGNOSIS — C541 Malignant neoplasm of endometrium: Secondary | ICD-10-CM | POA: Diagnosis not present

## 2019-08-24 DIAGNOSIS — Z794 Long term (current) use of insulin: Secondary | ICD-10-CM | POA: Diagnosis not present

## 2019-08-24 DIAGNOSIS — E119 Type 2 diabetes mellitus without complications: Secondary | ICD-10-CM | POA: Diagnosis not present

## 2019-08-24 DIAGNOSIS — I10 Essential (primary) hypertension: Secondary | ICD-10-CM | POA: Diagnosis not present

## 2019-08-27 ENCOUNTER — Other Ambulatory Visit: Payer: Self-pay

## 2019-08-27 ENCOUNTER — Telehealth: Payer: Self-pay

## 2019-08-27 ENCOUNTER — Emergency Department (HOSPITAL_COMMUNITY)
Admission: EM | Admit: 2019-08-27 | Discharge: 2019-08-27 | Disposition: A | Discharge status: Home / Self Care | Payer: Medicare Other | Attending: Emergency Medicine | Admitting: Emergency Medicine

## 2019-08-27 ENCOUNTER — Emergency Department (HOSPITAL_COMMUNITY): Payer: Medicare Other

## 2019-08-27 ENCOUNTER — Encounter (HOSPITAL_COMMUNITY): Payer: Self-pay | Admitting: Emergency Medicine

## 2019-08-27 DIAGNOSIS — Z79899 Other long term (current) drug therapy: Secondary | ICD-10-CM | POA: Insufficient documentation

## 2019-08-27 DIAGNOSIS — Z8542 Personal history of malignant neoplasm of other parts of uterus: Secondary | ICD-10-CM | POA: Diagnosis not present

## 2019-08-27 DIAGNOSIS — J181 Lobar pneumonia, unspecified organism: Secondary | ICD-10-CM | POA: Insufficient documentation

## 2019-08-27 DIAGNOSIS — R0602 Shortness of breath: Secondary | ICD-10-CM | POA: Diagnosis not present

## 2019-08-27 DIAGNOSIS — Z85828 Personal history of other malignant neoplasm of skin: Secondary | ICD-10-CM | POA: Insufficient documentation

## 2019-08-27 DIAGNOSIS — Z87891 Personal history of nicotine dependence: Secondary | ICD-10-CM | POA: Diagnosis not present

## 2019-08-27 DIAGNOSIS — R110 Nausea: Secondary | ICD-10-CM | POA: Insufficient documentation

## 2019-08-27 DIAGNOSIS — I10 Essential (primary) hypertension: Secondary | ICD-10-CM | POA: Diagnosis not present

## 2019-08-27 DIAGNOSIS — R05 Cough: Secondary | ICD-10-CM | POA: Insufficient documentation

## 2019-08-27 DIAGNOSIS — E119 Type 2 diabetes mellitus without complications: Secondary | ICD-10-CM | POA: Insufficient documentation

## 2019-08-27 DIAGNOSIS — Z794 Long term (current) use of insulin: Secondary | ICD-10-CM | POA: Insufficient documentation

## 2019-08-27 DIAGNOSIS — J189 Pneumonia, unspecified organism: Secondary | ICD-10-CM

## 2019-08-27 DIAGNOSIS — Z20828 Contact with and (suspected) exposure to other viral communicable diseases: Secondary | ICD-10-CM | POA: Diagnosis not present

## 2019-08-27 LAB — CBC
HCT: 34.9 % — ABNORMAL LOW (ref 36.0–46.0)
Hemoglobin: 11.4 g/dL — ABNORMAL LOW (ref 12.0–15.0)
MCH: 31 pg (ref 26.0–34.0)
MCHC: 32.7 g/dL (ref 30.0–36.0)
MCV: 94.8 fL (ref 80.0–100.0)
Platelets: 223 10*3/uL (ref 150–400)
RBC: 3.68 MIL/uL — ABNORMAL LOW (ref 3.87–5.11)
RDW: 13.9 % (ref 11.5–15.5)
WBC: 7.3 10*3/uL (ref 4.0–10.5)
nRBC: 0 % (ref 0.0–0.2)

## 2019-08-27 LAB — BASIC METABOLIC PANEL
Anion gap: 12 (ref 5–15)
BUN: 20 mg/dL (ref 8–23)
CO2: 21 mmol/L — ABNORMAL LOW (ref 22–32)
Calcium: 9.4 mg/dL (ref 8.9–10.3)
Chloride: 107 mmol/L (ref 98–111)
Creatinine, Ser: 1.07 mg/dL — ABNORMAL HIGH (ref 0.44–1.00)
GFR calc Af Amer: >60 mL/min (ref >60)
GFR calc non Af Amer: 52 mL/min — ABNORMAL LOW (ref >60)
Glucose, Bld: 153 mg/dL — ABNORMAL HIGH (ref 70–99)
Potassium: 4.2 mmol/L (ref 3.5–5.1)
Sodium: 140 mmol/L (ref 135–145)

## 2019-08-27 LAB — TROPONIN I (HIGH SENSITIVITY): Troponin I (High Sensitivity): 10 ng/L (ref <18)

## 2019-08-27 LAB — D-DIMER, QUANTITATIVE: D-Dimer, Quant: 0.67 ug/mL-FEU — ABNORMAL HIGH (ref 0.00–0.50)

## 2019-08-27 MED ORDER — AZITHROMYCIN 250 MG PO TABS: 250.0000 mg | ORAL_TABLET | Freq: Every day | ORAL | 0 refills | Status: DC

## 2019-08-27 MED ORDER — AMOXICILLIN-POT CLAVULANATE 875-125 MG PO TABS
1.0000 | ORAL_TABLET | Freq: Once | ORAL | Status: AC
  Administered 2019-08-27: 1 via ORAL
  Filled 2019-08-27: qty 1

## 2019-08-27 MED ORDER — AZITHROMYCIN 250 MG PO TABS
500.0000 mg | ORAL_TABLET | Freq: Once | ORAL | Status: AC
  Administered 2019-08-27: 22:00:00 500 mg via ORAL
  Filled 2019-08-27: qty 2

## 2019-08-27 MED ORDER — AMOXICILLIN-POT CLAVULANATE 875-125 MG PO TABS: 1.0000 | ORAL_TABLET | Freq: Two times a day (BID) | ORAL | 0 refills | Status: DC

## 2019-08-27 MED ORDER — SODIUM CHLORIDE 0.9% FLUSH: 3.0000 mL | Freq: Once | INTRAVENOUS | Status: DC

## 2019-08-27 NOTE — Telephone Encounter (Signed)
Told Ms Troiani that Dr. Denman George does not feel that her symptoms are not surgically related.  She is at high risk for Covid 19.  She needs to go to the [place] test site and go into the community line. Pt verbalized understanding

## 2019-08-27 NOTE — Telephone Encounter (Signed)
[name] had surgery on 08-20-19. She was fine until 08-21-19 evening.  She began with N/V.  She felt as though her colon was irritated from the gas from the laparoscopic procedure.  Vomiting resolved ~ Saturday .  Nausea continued through Monday.  Pt felt much better Tuesday and began to do laundry and move around the house more.  She was SOB with exertion and began with a dry cough.  She has a dry cough with deep inspiration as well. No chest pain noted. Denies and lower extremity swelling or red andhot spots on legs. She is able  To bear weight without pain. Afebrile. No nasal or sinus congestion noted. Throat irritation from intubation improving. Told [name] that this would be reviewed with Joylene John, NP  and would called her back.

## 2019-08-27 NOTE — ED Provider Notes (Signed)
[facility] EMERGENCY DEPARTMENT Provider Note   CSN: [id number] Arrival date & time: 08/27/19  1520     History   Chief Complaint Chief Complaint  Patient presents with  . Shortness of Breath  . Nausea    HPI [name] is a 69 y.o. female.     69yo F w/ PMH including endometrial CA, T2DM, HTN, HLD who p/w shortness of breath.  1 week ago, the patient had laparoscopic surgery originally intended for hysterectomy but OB/GYN actually placed an IUD and didn't perform hysterectomy due to scar tissue.  The following few days while at home, patient had severe nausea but the symptoms eventually improved and she felt well.  Over the past few days, she began having cough with deep inspiration followed by some shortness of breath that has been more noticeable today.  Shortness of breath prompted her to be seen by urgent care as she was concerned about pneumonia.  There she had a chest x-ray and was sent to the ED for further evaluation due to concern for possible PE.  She denies any chest pain, abdominal pain, fevers, sick contacts, or leg swelling.  The history is provided by the patient.  Shortness of Breath   Past Medical History:  Diagnosis Date  . Arthritis   . Carpal tunnel syndrome    bilateral, had surgery on right hand  . Diabetes mellitus without complication ([place])   . Diverticulitis   . Endometrial cancer ([place])   . GERD (gastroesophageal reflux disease)   . Gout   . History of iron deficiency   . Hyperlipidemia   . Hypertension   . Skin cancer     Patient Active Problem List   Diagnosis Date Noted  . Endometrial cancer ([place]) 08/20/2019  . Pelvic adhesive disease 08/20/2019  . SBO (small bowel obstruction) ([place]) 06/07/2016  . Lung infiltrate 06/07/2016  . Diabetes mellitus with complication ([place]) 62/94/7654  . HLD (hyperlipidemia) 06/07/2016  . Essential hypertension 06/07/2016  . Gout 06/07/2016    Past Surgical History:  Procedure  Laterality Date  . CARPAL TUNNEL RELEASE Right   . COLON SURGERY    . COLONOSCOPY     x2  . COLOSTOMY    . COLOSTOMY REVERSAL    . DILATATION & CURETTAGE/HYSTEROSCOPY WITH MYOSURE N/A 07/17/2019   Procedure: DILATATION & CURETTAGE/HYSTEROSCOPY WITH MYOSURE;  Surgeon: Louretta Shorten, MD;  Location: [facility];  Service: Gynecology;  Laterality: N/A;  . OOPHORECTOMY     unilateral  . PILONIDAL CYST EXCISION    . ROBOTIC ASSISTED TOTAL HYSTERECTOMY WITH BILATERAL SALPINGO OOPHERECTOMY N/A 08/20/2019   Procedure: DIAGNSOTIC LAPAROSCOPY, DILATION AND CURETTAGE, IUD PLACEMENT ;  Surgeon: Everitt Amber, MD;  Location: [facility];  Service: Gynecology;  Laterality: N/A;  . SKIN CANCER EXCISION       OB History   No obstetric history on file.      Home Medications    Prior to Admission medications   Medication Sig Start Date End Date Taking? Authorizing Provider  allopurinol (ZYLOPRIM) 300 MG tablet Take 300 mg by mouth daily.    [provider]  amoxicillin-clavulanate (AUGMENTIN) 875-125 MG tablet Take 1 tablet by mouth every 12 (twelve) hours. 08/27/19   Lowell Makara, Wenda Overland, MD  aspirin 325 MG tablet Take 325 mg by mouth daily.    [provider]  atorvastatin (LIPITOR) 40 MG tablet Take 40 mg by mouth every evening.     [provider]  azithromycin Azucena Fallen)  250 MG tablet Take 1 tablet (250 mg total) by mouth daily. 08/27/19   Jahmya Onofrio, Wenda Overland, MD  Carboxymethylcellul-Glycerin (LUBRICATING EYE DROPS OP) Place 1 drop into both eyes daily as needed (dry eyes).    [provider]  Cholecalciferol (VITAMIN D3 PO) Take 1 capsule by mouth every evening.     [provider]  Coenzyme Q10 (COQ10) 100 MG CAPS Take 100 mg by mouth every evening.    [provider]  diphenhydrAMINE (BENADRYL) 25 mg capsule Take 50 mg by mouth at bedtime.    [provider]  famotidine (PEPCID) 20 MG tablet Take 20 mg by mouth daily.      [provider]  GLUCOSAMINE-CHONDROITIN PO Take 1 tablet by mouth 2 (two) times daily.    [provider]  ibuprofen (ADVIL) 200 MG tablet Take 800 mg by mouth every 6 (six) hours as needed for headache or moderate pain.    [provider]  ibuprofen (ADVIL) 600 MG tablet Take 1 tablet (600 mg total) by mouth every 6 (six) hours as needed for moderate pain. For AFTER surgery only 07/24/19   Joylene John D, NP  insulin NPH-regular Human (70-30) 100 UNIT/ML injection Inject 75 Units into the skin 2 (two) times daily.     [provider]  KRILL OIL PO Take 1 capsule by mouth daily.    [provider]  levonorgestrel (MIRENA) 20 MCG/24HR IUD 1 Intra Uterine Device (1 each total) by Intrauterine route once for 1 dose. To be placed at [facility] on 8/20 08/20/19 08/20/19  Joylene John D, NP  losartan (COZAAR) 100 MG tablet Take 100 mg by mouth every evening.     [provider]  Multiple Vitamins-Minerals (MULTIVITAMIN WITH MINERALS) tablet Take 1 tablet by mouth daily.    [provider]  oxyCODONE (OXY IR/ROXICODONE) 5 MG immediate release tablet Take 1 tablet (5 mg total) by mouth every 4 (four) hours as needed for severe pain. For AFTER surgery, do not take and drive 01/29/85   Cross, Lenna Sciara D, NP  senna-docusate (SENOKOT-S) 8.6-50 MG tablet Take 2 tablets by mouth at bedtime. For AFTER surgery, do not take if having diarrhea 07/24/19   Joylene John D, NP    Family History Family History  Problem Relation Age of Onset  . Diabetes Sister   . Skin cancer Father     Social History Social History   Tobacco Use  . Smoking status: Former Smoker    Packs/day: 3.00    Years: 22.00    Pack years: 66.00    Types: Cigarettes    Quit date: 06/08/1987    Years since quitting: 32.2  . Smokeless tobacco: Never Used  Substance Use Topics  . Alcohol use: Yes    Comment: twice per week  . Drug use: No     Allergies   Actos  [pioglitazone], Lisinopril, and Metformin and related   Review of Systems Review of Systems  Respiratory: Positive for shortness of breath.      Physical Exam Updated Vital Signs BP (!) 165/68   Pulse 88   Temp 99.2 F (37.3 C) (Oral)   Resp 16   Ht 5' 4.5" (1.638 m)   Wt 131.5 kg   SpO2 100%   BMI 49.01 kg/m   Physical Exam Vitals signs and nursing note reviewed.  Constitutional:      General: She is not in acute distress.    Appearance: She is well-developed.  HENT:  Head: Normocephalic and atraumatic.  Eyes:     Conjunctiva/sclera: Conjunctivae normal.  Neck:     Musculoskeletal: Neck supple.  Cardiovascular:     Rate and Rhythm: Normal rate and regular rhythm.     Heart sounds: Normal heart sounds. No murmur.  Pulmonary:     Effort: Pulmonary effort is normal.     Comments: Diminished b/l due to poor inspiratory effort Abdominal:     General: Bowel sounds are normal. There is no distension.     Palpations: Abdomen is soft.     Tenderness: There is no abdominal tenderness.  Musculoskeletal:     Right lower leg: She exhibits no tenderness.     Left lower leg: She exhibits no tenderness.  Skin:    General: Skin is warm and dry.  Neurological:     Mental Status: She is alert and oriented to person, place, and time.     Comments: Fluent speech  Psychiatric:        Judgment: Judgment normal.      ED Treatments / Results  Labs (all labs ordered are listed, but only abnormal results are displayed) Labs Reviewed  BASIC METABOLIC PANEL - Abnormal; Notable for the following components:      Result Value   CO2 21 (*)    Glucose, Bld 153 (*)    Creatinine, Ser 1.07 (*)    GFR calc non Af Amer 52 (*)    All other components within normal limits  CBC - Abnormal; Notable for the following components:   RBC 3.68 (*)    Hemoglobin 11.4 (*)    HCT 34.9 (*)    All other components within normal limits  D-DIMER, QUANTITATIVE (NOT AT [facility]) - Abnormal; Notable  for the following components:   D-Dimer, Quant 0.67 (*)    All other components within normal limits  TROPONIN I (HIGH SENSITIVITY)  TROPONIN I (HIGH SENSITIVITY)    EKG EKG Interpretation  Date/Time:  Thursday August 27 2019 15:27:28 EDT Ventricular Rate:  98 PR Interval:  154 QRS Duration: 86 QT Interval:  354 QTC Calculation: 451 R Axis:   44 Text Interpretation:  Normal sinus rhythm Normal ECG No significant change since last tracing Confirmed by Theotis Burrow [phone]) on 08/27/2019 8:19:56 PM   Radiology Dg Chest 2 View  Result Date: 08/27/2019 CLINICAL DATA:  69 year old female, recent IUD placement EXAM: CHEST - 2 VIEW COMPARISON:  CT abdomen pelvis 06/07/2016 FINDINGS: Mild airways thickening and consolidation present in the right lower lobe. Bandlike opacity in the right middle lobe compatible subsegmental atelectasis. No pneumothorax or effusion. Cardiomediastinal contours unremarkable. Degenerative changes are present in the and imaged spine and shoulders. Mask wire at the base of neck. IMPRESSION: Mild airways thickening and consolidation in the right lower lobe, could reflect developing bronchopneumonia. Electronically Signed   By: Lovena Le M.D.   On: 08/27/2019 16:52    Procedures Procedures (including critical care time)  Medications Ordered in ED Medications  sodium chloride flush (NS) 0.9 % injection 3 mL (has no administration in time range)  azithromycin (ZITHROMAX) tablet 500 mg (500 mg Oral Given 08/27/19 2130)  amoxicillin-clavulanate (AUGMENTIN) 875-125 MG per tablet 1 tablet (1 tablet Oral Given 08/27/19 2130)     Initial Impression / Assessment and Plan / ED Course  I have reviewed the triage vital signs and the nursing notes.  Pertinent labs & imaging results that were available during my care of the patient were reviewed by me and considered in  my medical decision making (see chart for details).        Comfortable with normal O2 sats,  reassuring VS on exam.  Lab work reassuring with normal WBC count.  D-dimer is 0.67 which is below her age-adjusted cut-off of 0.71. CXR shows early RLL bronchopneumonia which I feel explains her symptoms. Because of recent intubation, will cover for aspiration and atypical PNA w/ azithro and augmentin. She was tested for COVID today at urgent care. I have discussed supportive measures.  Given her well appearance, no oxygen requirement I feel she is appropriate for outpatient management.  I have extensively reviewed return precautions and she voiced understanding.  Soleil Mas was evaluated in Emergency Department on 08/28/2019 for the symptoms described in the history of present illness. She was evaluated in the context of the global COVID-19 pandemic, which necessitated consideration that the patient might be at risk for infection with the SARS-CoV-2 virus that causes COVID-19. Institutional protocols and algorithms that pertain to the evaluation of patients at risk for COVID-19 are in a state of rapid change based on information released by regulatory bodies including the CDC and federal and state organizations. These policies and algorithms were followed during the patient's care in the ED.   Final Clinical Impressions(s) / ED Diagnoses   Final diagnoses:  Pneumonia of right lower lobe due to infectious organism [facility])    ED Discharge Orders         Ordered    amoxicillin-clavulanate (AUGMENTIN) 875-125 MG tablet  Every 12 hours     08/27/19 2129    azithromycin (ZITHROMAX) 250 MG tablet  Daily     08/27/19 2129           Jazzman Loughmiller, Wenda Overland, MD 08/28/19 0004

## 2019-08-27 NOTE — ED Notes (Signed)
Patient verbalizes understanding of discharge instructions. Opportunity for questioning and answers were provided. Armband removed by staff, pt discharged from ED.  

## 2019-08-27 NOTE — ED Notes (Signed)
Spoke with EDP confirmed orders for patient.

## 2019-08-27 NOTE — ED Triage Notes (Addendum)
Recently had a procedure last Thursday had a IUD placed instead if a hysterectomy. Developed nausea over the weekend since then resolved however now has shortness of breath on exertion. Seen at urgent care blood work taken and chest xray. Sent to the ED for evaluation of possible PE.

## 2019-08-28 ENCOUNTER — Inpatient Hospital Stay: Payer: Medicare Other | Attending: Gynecologic Oncology | Admitting: Gynecologic Oncology

## 2019-08-28 ENCOUNTER — Encounter: Payer: Self-pay | Admitting: Gynecologic Oncology

## 2019-08-28 DIAGNOSIS — C541 Malignant neoplasm of endometrium: Secondary | ICD-10-CM | POA: Diagnosis not present

## 2019-08-28 DIAGNOSIS — Z7189 Other specified counseling: Secondary | ICD-10-CM

## 2019-08-28 NOTE — Progress Notes (Signed)
Gynecologic Oncology Telehealth Follow-up Note: Gyn-Onc  I connected with [name] on 08/28/19 at  4:00 PM EDT by telephone and verified that I am speaking with the correct person using two identifiers.  I discussed the limitations, risks, security and privacy concerns of performing an evaluation and management service by telemedicine and the availability of in-person appointments. I also discussed with the patient that there may be a patient responsible charge related to this service. The patient expressed understanding and agreed to proceed.  Other persons participating in the visit and their role in the encounter: none.  Patient's location: home Provider's location: [place]  Chief Complaint:  Chief Complaint  Patient presents with  . endometrial cancer    counseling and coordination of care    Assessment/Plan:  [name]  is a 69 y.o.  year old with clinical stage I grade 1 endometrioid endometrial cancer s/p D&C and IUD (progestin releasing) on 08/20/19/  We discussed that we are attempting medical therapy for her endometrial cancer given the findings surgically of a hostile abdomen with severe dense adhesive disease between the intestines and the anterior abdominal wall that precluded safe minimally invasive surgical entry and adhesio lysis.  If she were to have a hysterectomy it would need to be via laparotomy approach.  She is morbidly obese and a surgery via laparotomy for pelvic structure would require a large vertical incision, extensive adhesio lysis and possible bowel resection, and likely high risk for morbidity including risk for a nonhealing wound (the patient is also an insulin-dependent diabetic)  We will reevaluate her endometrium with an endometrial [place] biopsy in November 2020.  If there has been resolution of the endometrial cancer, she requires no additional therapy but instead continued use of the IUD.  If she were to develop new onset  bleeding, it would need to be reevaluated with sampling.  If the repeat sampling in November 2020 shows persistent cancer, it would be my preference to continue ongoing IUD use, with consideration for the addition of oral progestins.  We could make plans at that point for either primary radiation versus surgery via laparotomy and lysis of adhesions.  We again discussed the critical role that obesity plays in both the development, and aggressive behavior of endometrial cancer.  I discussed that it is critically important that she attempts to lose weight.  I recommend her attempting to get her weight below 250 pounds.  I discussed strategies that she might adopt in order to make this possible keeping in mind her underlying diabetes diagnosis and the difficulty with modifying diet in the setting.  I explained that while she continues to be obese it increases the likelihood that her cancer will persist in the face of progestin.  Additionally it increases the aggressive nature of endometrial cancers and worsens prognosis.  Finally if we make a decision to proceed with hysterectomy at sometime in the future, her surgery will be far more risky and technically challenging and associated with high risk of complications if she remains morbidly obese.  We will schedule her for follow-up in 3 months time.  Endometrial biopsy was performed at that time.  HPI: [name] is a 69 year old P2 at the request of Dr Corinna Capra for grade 1 endometrioid endometrial cancer.   Patient reports a history of postmenopausal bleeding since July 2020.  Is been persistent low low level bleeding, spotting but no heavy passage of clots.  She reported this to her gynecologist who ordered  a transvaginal ultrasound on June 24, 2019.  This revealed a uterus measuring 6.7 x 3.4 x 5.5 cm with an endometrial thickness of 1.4 cm.  The ovaries were not visualized.  She was taken to the operating room for a curettage and polypectomy on July 17, 2019  which revealed FIGO grade 1 endometrioid adenocarcinoma.  Patient has a complex medical history.  She is morbidly obese with a BMI of 48 kg/m.  She experienced ruptured diverticulitis in 1997 which required a diverting colostomy and Hartman's pouch.  She 6 weeks later she was taken to the operating room for laparotomy and reversal of Hartman's.  She reports that the surgeon described her abdomen is looking like a gallon of superglue had been poured inside her.  It was an extensively long surgery.  She is unsure if additional bowel resections were necessary.  She had a 1 month hospitalization following this reversal of colostomy.  She developed a bowel obstruction during that hospitalization and required reoperation to resolve this.  In 2000 she developed a growth on an ovary (she cannot remember which side).  For this she underwent laparotomy and oophorectomy.  This surgery was performed in [place].  She was planning to have a hysterectomy at that procedure, however the surgeon aborted plans for hysterectomy and performed only oophorectomy after encountering significant adhesive disease.  She states that he told her surgery would have needed to the last 6 to 8 hours in order to perform hysterectomy due to the adhesive disease.  The patient has type 2 diabetes mellitus and has had this for greater than 30 years.  She reports having no endorgan disease.  Her hemoglobin A1c was 5.4% in May 2020.  Her family history is significant for a father with skin cancer.  She has 3 sisters with cancer including one with endometrial cancer who is obese, a sister with stage II 3 breast cancer, and multiple myeloma.  She has had 2 prior vaginal deliveries.  She lives in [place].  Interval Hx:  On August 20, 2019 she underwent an attempt at a robotic assisted total hysterectomy BSO and sentinel lymph node biopsy.  The surgery was aborted after laparoscopic entry into the left upper quadrant encountered dense  adhesions throughout the entirety of the abdomen which included dense colonic and small bowel adhesions to the anterior abdominal wall.  The density of these adhesions and their comprehensive nature across the entirety of her abdominal wall meant that it was not possible to place additional laparoscopic ports to perform adhesio lysis.  She would have required a laparotomy in order to achieve hysterectomy, and the patient had declined this preoperatively, and had elected to proceed with D&C with progestin releasing IUD placement if hysterectomy was not possible via minimally invasive surgical route.  Therefore we aborted the laparoscopic procedure and performed a D&C procedure with placement of a progestin releasing IUD.  Final pathology from the [facility] specimen showed adenocarcinoma favor endometrial primary.  It was difficult to grade the specimen due to his fragmented nature.  Since surgery the patient developed a dry cough and fever.  She was seen in the emergency department at [facility] with cover testing was negative.  A chest x-ray was suggestive for pneumonia.  She was empirically started on azithromycin and Augmentin for a hospital acquired pneumonia.  She denies abdominal pain.  She had one episode of emesis and nausea in the days following her surgery but this is now subsided and she has normal bowel habit  at present.  She reports only vaginal spotting.  Current Meds:  Outpatient Encounter Medications as of 08/28/2019  Medication Sig  . allopurinol (ZYLOPRIM) 300 MG tablet Take 300 mg by mouth daily.  Marland Kitchen amoxicillin-clavulanate (AUGMENTIN) 875-125 MG tablet Take 1 tablet by mouth every 12 (twelve) hours.  Marland Kitchen aspirin 325 MG tablet Take 325 mg by mouth daily.  Marland Kitchen atorvastatin (LIPITOR) 40 MG tablet Take 40 mg by mouth every evening.   Marland Kitchen azithromycin (ZITHROMAX) 250 MG tablet Take 1 tablet (250 mg total) by mouth daily.  . Carboxymethylcellul-Glycerin (LUBRICATING EYE DROPS OP) Place 1 drop into both  eyes daily as needed (dry eyes).  . Cholecalciferol (VITAMIN D3 PO) Take 1 capsule by mouth every evening.   . Coenzyme Q10 (COQ10) 100 MG CAPS Take 100 mg by mouth every evening.  . diphenhydrAMINE (BENADRYL) 25 mg capsule Take 50 mg by mouth at bedtime.  . famotidine (PEPCID) 20 MG tablet Take 20 mg by mouth daily.   Marland Kitchen GLUCOSAMINE-CHONDROITIN PO Take 1 tablet by mouth 2 (two) times daily.  Marland Kitchen ibuprofen (ADVIL) 200 MG tablet Take 800 mg by mouth every 6 (six) hours as needed for headache or moderate pain.  Marland Kitchen ibuprofen (ADVIL) 600 MG tablet Take 1 tablet (600 mg total) by mouth every 6 (six) hours as needed for moderate pain. For AFTER surgery only  . insulin NPH-regular Human (70-30) 100 UNIT/ML injection Inject 75 Units into the skin 2 (two) times daily.   Marland Kitchen KRILL OIL PO Take 1 capsule by mouth daily.  Marland Kitchen levonorgestrel (MIRENA) 20 MCG/24HR IUD 1 Intra Uterine Device (1 each total) by Intrauterine route once for 1 dose. To be placed at [facility] on 8/20  . losartan (COZAAR) 100 MG tablet Take 100 mg by mouth every evening.   . Multiple Vitamins-Minerals (MULTIVITAMIN WITH MINERALS) tablet Take 1 tablet by mouth daily.  Marland Kitchen oxyCODONE (OXY IR/ROXICODONE) 5 MG immediate release tablet Take 1 tablet (5 mg total) by mouth every 4 (four) hours as needed for severe pain. For AFTER surgery, do not take and drive  . senna-docusate (SENOKOT-S) 8.6-50 MG tablet Take 2 tablets by mouth at bedtime. For AFTER surgery, do not take if having diarrhea   No facility-administered encounter medications on file as of 08/28/2019.     Allergy:  Allergies  Allergen Reactions  . Actos [Pioglitazone]     Unknown reaction  . Lisinopril Cough  . Metformin And Related     Gas     Social Hx:   Social History   Socioeconomic History  . Marital status: Married    Spouse name: Not on file  . Number of children: Not on file  . Years of education: Not on file  . Highest education level: Not on file  Occupational  History  . Not on file  Social Needs  . Financial resource strain: Not on file  . Food insecurity    Worry: Not on file    Inability: Not on file  . Transportation needs    Medical: Not on file    Non-medical: Not on file  Tobacco Use  . Smoking status: Former Smoker    Packs/day: 3.00    Years: 22.00    Pack years: 66.00    Types: Cigarettes    Quit date: 06/08/1987    Years since quitting: 32.2  . Smokeless tobacco: Never Used  Substance and Sexual Activity  . Alcohol use: Yes    Comment: twice per week  .  Drug use: No  . Sexual activity: Yes    Birth control/protection: Post-menopausal  Lifestyle  . Physical activity    Days per week: Not on file    Minutes per session: Not on file  . Stress: Not on file  Relationships  . Social Herbalist on phone: Not on file    Gets together: Not on file    Attends religious service: Not on file    Active member of club or organization: Not on file    Attends meetings of clubs or organizations: Not on file    Relationship status: Not on file  . Intimate partner violence    Fear of current or ex partner: Not on file    Emotionally abused: Not on file    Physically abused: Not on file    Forced sexual activity: Not on file  Other Topics Concern  . Not on file  Social History Narrative  . Not on file    Past Surgical Hx:  Past Surgical History:  Procedure Laterality Date  . CARPAL TUNNEL RELEASE Right   . COLON SURGERY    . COLONOSCOPY     x2  . COLOSTOMY    . COLOSTOMY REVERSAL    . DILATATION & CURETTAGE/HYSTEROSCOPY WITH MYOSURE N/A 07/17/2019   Procedure: DILATATION & CURETTAGE/HYSTEROSCOPY WITH MYOSURE;  Surgeon: Louretta Shorten, MD;  Location: [place];  Service: Gynecology;  Laterality: N/A;  . OOPHORECTOMY     unilateral  . PILONIDAL CYST EXCISION    . ROBOTIC ASSISTED TOTAL HYSTERECTOMY WITH BILATERAL SALPINGO OOPHERECTOMY N/A 08/20/2019   Procedure: DIAGNSOTIC LAPAROSCOPY, DILATION AND  CURETTAGE, IUD PLACEMENT ;  Surgeon: Everitt Amber, MD;  Location: [facility];  Service: Gynecology;  Laterality: N/A;  . SKIN CANCER EXCISION      Past Medical Hx:  Past Medical History:  Diagnosis Date  . Arthritis   . Carpal tunnel syndrome    bilateral, had surgery on right hand  . Diabetes mellitus without complication ([place])   . Diverticulitis   . Endometrial cancer ([place])   . GERD (gastroesophageal reflux disease)   . Gout   . History of iron deficiency   . Hyperlipidemia   . Hypertension   . Skin cancer     Past Gynecological History:  See HPI.  No LMP recorded. Patient is postmenopausal.  Family Hx:  Family History  Problem Relation Age of Onset  . Diabetes Sister   . Skin cancer Father     Review of Systems:  Constitutional  Feels well,   ENT Normal appearing ears and nares bilaterally Skin/Breast  No rash, sores, jaundice, itching, dryness Cardiovascular  No chest pain, shortness of breath, or edema  Pulmonary  No cough or wheeze.  Gastro Intestinal  No nausea, vomitting, or diarrhoea. No bright red blood per rectum, no abdominal pain, change in bowel movement, or constipation.  Genito Urinary  No frequency, urgency, dysuria, + bleeding Musculo Skeletal  No myalgia, arthralgia, joint swelling or pain  Neurologic  No weakness, numbness, change in gait,  Psychology  No depression, anxiety, insomnia.   Vitals:  There were no vitals taken for this visit.  Physical Exam: Deferred  I discussed the assessment and treatment plan with the patient. The patient was provided with an opportunity to ask questions and all were answered. The patient agreed with the plan and demonstrated an understanding of the instructions.   The patient was advised to call back or see an in-person  evaluation if the symptoms worsen or if the condition fails to improve as anticipated.   I provided 15 minutes of non face-to-face telephone visit time during this encounter, and > 50% was  spent counseling as documented under my assessment & plan.Thereasa Solo, MD  08/28/2019, 4:38 PM

## 2019-09-14 ENCOUNTER — Ambulatory Visit: Payer: Medicare Other | Admitting: Gynecologic Oncology

## 2019-09-21 DIAGNOSIS — J189 Pneumonia, unspecified organism: Secondary | ICD-10-CM | POA: Diagnosis not present

## 2019-09-22 DIAGNOSIS — J189 Pneumonia, unspecified organism: Secondary | ICD-10-CM | POA: Diagnosis not present

## 2019-09-30 DIAGNOSIS — D2272 Melanocytic nevi of left lower limb, including hip: Secondary | ICD-10-CM | POA: Diagnosis not present

## 2019-09-30 DIAGNOSIS — D2262 Melanocytic nevi of left upper limb, including shoulder: Secondary | ICD-10-CM | POA: Diagnosis not present

## 2019-09-30 DIAGNOSIS — L720 Epidermal cyst: Secondary | ICD-10-CM | POA: Diagnosis not present

## 2019-09-30 DIAGNOSIS — D2239 Melanocytic nevi of other parts of face: Secondary | ICD-10-CM | POA: Diagnosis not present

## 2019-09-30 DIAGNOSIS — L821 Other seborrheic keratosis: Secondary | ICD-10-CM | POA: Diagnosis not present

## 2019-09-30 DIAGNOSIS — D225 Melanocytic nevi of trunk: Secondary | ICD-10-CM | POA: Diagnosis not present

## 2019-09-30 DIAGNOSIS — L814 Other melanin hyperpigmentation: Secondary | ICD-10-CM | POA: Diagnosis not present

## 2019-09-30 DIAGNOSIS — D2271 Melanocytic nevi of right lower limb, including hip: Secondary | ICD-10-CM | POA: Diagnosis not present

## 2019-09-30 DIAGNOSIS — D1801 Hemangioma of skin and subcutaneous tissue: Secondary | ICD-10-CM | POA: Diagnosis not present

## 2019-10-16 DIAGNOSIS — Z1231 Encounter for screening mammogram for malignant neoplasm of breast: Secondary | ICD-10-CM | POA: Diagnosis not present

## 2019-11-02 ENCOUNTER — Telehealth: Payer: Self-pay | Admitting: *Deleted

## 2019-11-02 NOTE — Telephone Encounter (Signed)
Returned the patient's call regarding her voicemail message. Explained that on her appt on 12/11 Dr. Denman George will perform a biopsy that day to check fore cancer. Patient verbalized understanding

## 2019-12-01 DIAGNOSIS — E119 Type 2 diabetes mellitus without complications: Secondary | ICD-10-CM | POA: Diagnosis not present

## 2019-12-01 DIAGNOSIS — H524 Presbyopia: Secondary | ICD-10-CM | POA: Diagnosis not present

## 2019-12-04 DIAGNOSIS — E119 Type 2 diabetes mellitus without complications: Secondary | ICD-10-CM | POA: Diagnosis not present

## 2019-12-11 ENCOUNTER — Other Ambulatory Visit: Payer: Self-pay

## 2019-12-11 ENCOUNTER — Encounter: Payer: Self-pay | Admitting: Gynecologic Oncology

## 2019-12-11 ENCOUNTER — Inpatient Hospital Stay: Payer: Medicare Other | Attending: Gynecologic Oncology | Admitting: Gynecologic Oncology

## 2019-12-11 VITALS — BP 170/59 | HR 87 | Resp 20 | Ht 65.5 in | Wt 294.2 lb

## 2019-12-11 DIAGNOSIS — E119 Type 2 diabetes mellitus without complications: Secondary | ICD-10-CM | POA: Diagnosis not present

## 2019-12-11 DIAGNOSIS — Z90722 Acquired absence of ovaries, bilateral: Secondary | ICD-10-CM | POA: Insufficient documentation

## 2019-12-11 DIAGNOSIS — E785 Hyperlipidemia, unspecified: Secondary | ICD-10-CM | POA: Diagnosis not present

## 2019-12-11 DIAGNOSIS — Z79899 Other long term (current) drug therapy: Secondary | ICD-10-CM | POA: Diagnosis not present

## 2019-12-11 DIAGNOSIS — Z7982 Long term (current) use of aspirin: Secondary | ICD-10-CM | POA: Insufficient documentation

## 2019-12-11 DIAGNOSIS — I10 Essential (primary) hypertension: Secondary | ICD-10-CM | POA: Diagnosis not present

## 2019-12-11 DIAGNOSIS — Z794 Long term (current) use of insulin: Secondary | ICD-10-CM | POA: Insufficient documentation

## 2019-12-11 DIAGNOSIS — Z90710 Acquired absence of both cervix and uterus: Secondary | ICD-10-CM | POA: Insufficient documentation

## 2019-12-11 DIAGNOSIS — Z975 Presence of (intrauterine) contraceptive device: Secondary | ICD-10-CM | POA: Insufficient documentation

## 2019-12-11 DIAGNOSIS — K219 Gastro-esophageal reflux disease without esophagitis: Secondary | ICD-10-CM | POA: Insufficient documentation

## 2019-12-11 DIAGNOSIS — Z6841 Body Mass Index (BMI) 40.0 and over, adult: Secondary | ICD-10-CM | POA: Diagnosis not present

## 2019-12-11 DIAGNOSIS — C541 Malignant neoplasm of endometrium: Secondary | ICD-10-CM

## 2019-12-11 DIAGNOSIS — Z87891 Personal history of nicotine dependence: Secondary | ICD-10-CM | POA: Insufficient documentation

## 2019-12-11 NOTE — Progress Notes (Signed)
Gynecologic Oncology Follow-up Note  Chief Complaint:  Chief Complaint  Patient presents with  . Endometrial adenocarcinoma [facility])    Assessment/Plan:  [name]  is a 69 y.o.  year old with clinical stage I grade 1 endometrioid endometrial cancer s/p D&C and IUD (progestin releasing) on 08/20/19.  We resampled the endometrium today.  If this shows resolution of the endometrial cancer, she requires no additional therapy but instead continued use of the IUD. I would see her in 6 months to enquire about symptoms but would not sample if she were asymptomatic. If she were to develop new onset bleeding, it would need to be reevaluated with sampling.  If the repeat sampling in November 2020 shows persistent cancer, it would be my preference to continue ongoing IUD use, with consideration for the addition of oral progestins.  We could make plans at that point for either primary radiation versus surgery via laparotomy and lysis of adhesions.  HPI: [name] is a 69 year old P2 at the request of Dr Corinna Capra for grade 1 endometrioid endometrial cancer.   Patient reports a history of postmenopausal bleeding since July 2020.  Is been persistent low low level bleeding, spotting but no heavy passage of clots.  She reported this to her gynecologist who ordered a transvaginal ultrasound on June 24, 2019.  This revealed a uterus measuring 6.7 x 3.4 x 5.5 cm with an endometrial thickness of 1.4 cm.  The ovaries were not visualized.  She was taken to the operating room for a curettage and polypectomy on July 17, 2019 which revealed FIGO grade 1 endometrioid adenocarcinoma.  Patient has a complex medical history.  She is morbidly obese with a BMI of 48 kg/m.  She experienced ruptured diverticulitis in 1997 which required a diverting colostomy and Hartman's pouch.  She 6 weeks later she was taken to the operating room for laparotomy and reversal of Hartman's.  She reports that the surgeon described her abdomen  is looking like a gallon of superglue had been poured inside her.  It was an extensively long surgery.  She is unsure if additional bowel resections were necessary.  She had a 1 month hospitalization following this reversal of colostomy.  She developed a bowel obstruction during that hospitalization and required reoperation to resolve this.  In 2000 she developed a growth on an ovary (she cannot remember which side).  For this she underwent laparotomy and oophorectomy.  This surgery was performed in [place].  She was planning to have a hysterectomy at that procedure, however the surgeon aborted plans for hysterectomy and performed only oophorectomy after encountering significant adhesive disease.  She states that he told her surgery would have needed to the last 6 to 8 hours in order to perform hysterectomy due to the adhesive disease.  The patient has type 2 diabetes mellitus and has had this for greater than 30 years.  She reports having no endorgan disease.  Her hemoglobin A1c was 5.4% in May 2020.  Her family history is significant for a father with skin cancer.  She has 3 sisters with cancer including one with endometrial cancer who is obese, a sister with stage II 3 breast cancer, and multiple myeloma.  She has had 2 prior vaginal deliveries.  She lives in [place].  On August 20, 2019 she underwent an attempt at a robotic assisted total hysterectomy BSO and sentinel lymph node biopsy.  The surgery was aborted after laparoscopic entry into the left upper quadrant encountered dense  adhesions throughout the entirety of the abdomen which included dense colonic and small bowel adhesions to the anterior abdominal wall.  The density of these adhesions and their comprehensive nature across the entirety of her abdominal wall meant that it was not possible to place additional laparoscopic ports to perform adhesio lysis.  She would have required a laparotomy in order to achieve hysterectomy, and the  patient had declined this preoperatively, and had elected to proceed with D&C with progestin releasing IUD placement if hysterectomy was not possible via minimally invasive surgical route.  Therefore we aborted the laparoscopic procedure and performed a D&C procedure with placement of a progestin releasing IUD.  Final pathology from the [facility] specimen showed adenocarcinoma favor endometrial primary.  It was difficult to grade the specimen due to his fragmented nature.  Interval Hx:  She reports that since placement of the IUD she has had no further vaginal bleeding.  Current Meds:  Outpatient Encounter Medications as of 12/11/2019  Medication Sig  . allopurinol (ZYLOPRIM) 300 MG tablet Take 300 mg by mouth daily.  Marland Kitchen aspirin 325 MG tablet Take 325 mg by mouth daily.  Marland Kitchen atorvastatin (LIPITOR) 40 MG tablet Take 40 mg by mouth every evening.   . Carboxymethylcellul-Glycerin (LUBRICATING EYE DROPS OP) Place 1 drop into both eyes daily as needed (dry eyes).  . Cholecalciferol (VITAMIN D3 PO) Take 1 capsule by mouth every evening.   . Coenzyme Q10 (COQ10) 100 MG CAPS Take 100 mg by mouth every evening.  . diphenhydrAMINE (BENADRYL) 25 mg capsule Take 50 mg by mouth at bedtime.  . famotidine (PEPCID) 20 MG tablet Take 20 mg by mouth daily.   Marland Kitchen GLUCOSAMINE-CHONDROITIN PO Take 1 tablet by mouth 2 (two) times daily.  Marland Kitchen ibuprofen (ADVIL) 200 MG tablet Take 800 mg by mouth every 6 (six) hours as needed for headache or moderate pain.  Marland Kitchen ibuprofen (ADVIL) 600 MG tablet Take 1 tablet (600 mg total) by mouth every 6 (six) hours as needed for moderate pain. For AFTER surgery only  . insulin NPH-regular Human (70-30) 100 UNIT/ML injection Inject 75 Units into the skin 2 (two) times daily.   Marland Kitchen KRILL OIL PO Take 1 capsule by mouth daily.  Marland Kitchen levonorgestrel (MIRENA) 20 MCG/24HR IUD 1 Intra Uterine Device (1 each total) by Intrauterine route once for 1 dose. To be placed at [facility] on 8/20  . losartan (COZAAR) 100  MG tablet Take 100 mg by mouth every evening.   . Multiple Vitamins-Minerals (MULTIVITAMIN WITH MINERALS) tablet Take 1 tablet by mouth daily.  . [DISCONTINUED] amoxicillin-clavulanate (AUGMENTIN) 875-125 MG tablet Take 1 tablet by mouth every 12 (twelve) hours.  . [DISCONTINUED] azithromycin (ZITHROMAX) 250 MG tablet Take 1 tablet (250 mg total) by mouth daily.  . [DISCONTINUED] oxyCODONE (OXY IR/ROXICODONE) 5 MG immediate release tablet Take 1 tablet (5 mg total) by mouth every 4 (four) hours as needed for severe pain. For AFTER surgery, do not take and drive  . [DISCONTINUED] senna-docusate (SENOKOT-S) 8.6-50 MG tablet Take 2 tablets by mouth at bedtime. For AFTER surgery, do not take if having diarrhea   No facility-administered encounter medications on file as of 12/11/2019.    Allergy:  Allergies  Allergen Reactions  . Actos [Pioglitazone]     Unknown reaction  . Lisinopril Cough  . Metformin And Related     Gas     Social Hx:   Social History   Socioeconomic History  . Marital status: Married    Spouse  name: Not on file  . Number of children: Not on file  . Years of education: Not on file  . Highest education level: Not on file  Occupational History  . Not on file  Tobacco Use  . Smoking status: Former Smoker    Packs/day: 3.00    Years: 22.00    Pack years: 66.00    Types: Cigarettes    Quit date: 06/08/1987    Years since quitting: 32.5  . Smokeless tobacco: Never Used  Substance and Sexual Activity  . Alcohol use: Yes    Comment: twice per week  . Drug use: No  . Sexual activity: Yes    Birth control/protection: Post-menopausal  Other Topics Concern  . Not on file  Social History Narrative  . Not on file   Social Determinants of Health   Financial Resource Strain:   . Difficulty of Paying Living Expenses: Not on file  Food Insecurity:   . Worried About Charity fundraiser in the Last Year: Not on file  . Ran Out of Food in the Last Year: Not on file   Transportation Needs:   . Lack of Transportation (Medical): Not on file  . Lack of Transportation (Non-Medical): Not on file  Physical Activity:   . Days of Exercise per Week: Not on file  . Minutes of Exercise per Session: Not on file  Stress:   . Feeling of Stress : Not on file  Social Connections:   . Frequency of Communication with Friends and Family: Not on file  . Frequency of Social Gatherings with Friends and Family: Not on file  . Attends Religious Services: Not on file  . Active Member of Clubs or Organizations: Not on file  . Attends Archivist Meetings: Not on file  . Marital Status: Not on file  Intimate Partner Violence:   . Fear of Current or Ex-Partner: Not on file  . Emotionally Abused: Not on file  . Physically Abused: Not on file  . Sexually Abused: Not on file    Past Surgical Hx:  Past Surgical History:  Procedure Laterality Date  . CARPAL TUNNEL RELEASE Right   . COLON SURGERY    . COLONOSCOPY     x2  . COLOSTOMY    . COLOSTOMY REVERSAL    . DILATATION & CURETTAGE/HYSTEROSCOPY WITH MYOSURE N/A 07/17/2019   Procedure: DILATATION & CURETTAGE/HYSTEROSCOPY WITH MYOSURE;  Surgeon: Louretta Shorten, MD;  Location: [facility];  Service: Gynecology;  Laterality: N/A;  . OOPHORECTOMY     unilateral  . PILONIDAL CYST EXCISION    . ROBOTIC ASSISTED TOTAL HYSTERECTOMY WITH BILATERAL SALPINGO OOPHERECTOMY N/A 08/20/2019   Procedure: DIAGNSOTIC LAPAROSCOPY, DILATION AND CURETTAGE, IUD PLACEMENT ;  Surgeon: Everitt Amber, MD;  Location: [facility];  Service: Gynecology;  Laterality: N/A;  . SKIN CANCER EXCISION      Past Medical Hx:  Past Medical History:  Diagnosis Date  . Arthritis   . Carpal tunnel syndrome    bilateral, had surgery on right hand  . Diabetes mellitus without complication ([place])   . Diverticulitis   . Endometrial cancer ([place])   . GERD (gastroesophageal reflux disease)   . Gout   . History of iron deficiency   . Hyperlipidemia    . Hypertension   . Skin cancer     Past Gynecological History:  See HPI.  No LMP recorded. Patient is postmenopausal.  Family Hx:  Family History  Problem Relation Age of Onset  .  Diabetes Sister   . Skin cancer Father     Review of Systems:  Constitutional  Feels well,   ENT Normal appearing ears and nares bilaterally Skin/Breast  No rash, sores, jaundice, itching, dryness Cardiovascular  No chest pain, shortness of breath, or edema  Pulmonary  No cough or wheeze.  Gastro Intestinal  No nausea, vomitting, or diarrhoea. No bright red blood per rectum, no abdominal pain, change in bowel movement, or constipation.  Genito Urinary  No frequency, urgency, dysuria, no bleeding Musculo Skeletal  No myalgia, arthralgia, joint swelling or pain  Neurologic  No weakness, numbness, change in gait,  Psychology  No depression, anxiety, insomnia.   Vitals:  Blood pressure (!) 170/59, pulse 87, resp. rate 20, height 5' 5.5" (1.664 m), weight 294 lb 3 oz (133.4 kg), SpO2 98 %.  Physical Exam: WD in NAD Neck  Supple NROM, without any enlargements.  Lymph Node Survey No cervical supraclavicular or inguinal adenopathy Cardiovascular  Pulse normal rate, regularity and rhythm. S1 and S2 normal.  Lungs  Clear to auscultation bilateraly, without wheezes/crackles/rhonchi. Good air movement.  Skin  No rash/lesions/breakdown  Psychiatry  Alert and oriented to person, place, and time  Abdomen  Normoactive bowel sounds, abdomen soft, non-tender and obese without evidence of hernia.  Back No CVA tenderness Genito Urinary  Vulva/vagina: Normal external female genitalia.  No lesions. No discharge or bleeding.  Bladder/urethra:  No lesions or masses, well supported bladder  Vagina: normal, no lesions  Cervix: Normal appearing, no lesions.  Uterus: slightly bulky, mobile, no parametrial involvement or nodularity.  Adnexa: no palpable masses. Rectal  deferred Extremities  No  bilateral cyanosis, clubbing or edema.  Procedure Note:  Preop Dx: endometrial cancer Postop Dx: same Procedure: endometrial biopsy Surgeon: Dorann Ou, MD EBL: <1cc Specimens: endometrial biopsy Complications: none Procedure Details: The patient was explained the procedure, she provided verbal consent, her verbal timeout was performed.  The speculum was inserted into the vagina and the cervix was visualized.  It was grasped with a single-tooth tenaculum.  An endometrial Pipelle was inserted to a depth of 10 cm.  2 passes of the endometrial cavity with the Pipelle took place with aspiration of a scant amount of endometrial tissue.  The IUD strings were visualized coming from the cervical os at the completion of the procedure demonstrating an in place IUD that had not been disturbed by the procedure.  Scant bleeding took place after removal of the tenaculum.  The patient tolerated the procedure well.  The specimen was sent for histopathology.    Thereasa Solo, MD  12/11/2019, 2:35 PM

## 2019-12-11 NOTE — Patient Instructions (Signed)
Dr Denman George biopsied the uterine lining (endometrium) today. She will call you next week with the results.  If it shows that the cancer has gone away, she will see you back in 6 months (unless you develop new onset of bleeding in which case she would want to see you sooner).   If it shows that the cancer is still persistent, she will recommend adding tablet progesterone.

## 2019-12-14 ENCOUNTER — Telehealth: Payer: Self-pay

## 2019-12-14 LAB — SURGICAL PATHOLOGY

## 2019-12-14 NOTE — Telephone Encounter (Signed)
Told Ms Newmann the results of the biopsy as noted below by Joylene John, NP. Pt has a f/u appointment on 06-14-20 Pt verbalized understanding.

## 2019-12-14 NOTE — Telephone Encounter (Signed)
-----   Message from Dorothyann Gibbs, NP sent at 12/14/2019  1:45 PM EST ----- Can you let her know that her biopsy returned showing hormone effect on the tissue from the IUD with no precancer or cancer seen?  Good news.  She will need to follow up in six months or sooner if any new symptoms occur.  "If this shows resolution of the endometrial cancer, she requires no additional therapy but instead continued use of the IUD. I would see her in 6 months to enquire about symptoms but would not sample if she were asymptomatic. If she were to develop new onset bleeding, it would need to be reevaluated with sampling."

## 2020-06-14 ENCOUNTER — Other Ambulatory Visit: Payer: Self-pay

## 2020-06-14 ENCOUNTER — Encounter: Payer: Self-pay | Admitting: Gynecologic Oncology

## 2020-06-14 ENCOUNTER — Inpatient Hospital Stay: Payer: Medicare Other | Attending: Gynecologic Oncology | Admitting: Gynecologic Oncology

## 2020-06-14 VITALS — BP 174/68 | HR 83 | Temp 98.5°F | Resp 15 | Ht 65.5 in | Wt 295.6 lb

## 2020-06-14 DIAGNOSIS — Z90710 Acquired absence of both cervix and uterus: Secondary | ICD-10-CM | POA: Diagnosis not present

## 2020-06-14 DIAGNOSIS — Z975 Presence of (intrauterine) contraceptive device: Secondary | ICD-10-CM | POA: Insufficient documentation

## 2020-06-14 DIAGNOSIS — Z90722 Acquired absence of ovaries, bilateral: Secondary | ICD-10-CM | POA: Insufficient documentation

## 2020-06-14 DIAGNOSIS — Z6841 Body Mass Index (BMI) 40.0 and over, adult: Secondary | ICD-10-CM | POA: Diagnosis not present

## 2020-06-14 DIAGNOSIS — C541 Malignant neoplasm of endometrium: Secondary | ICD-10-CM | POA: Diagnosis not present

## 2020-06-14 DIAGNOSIS — E119 Type 2 diabetes mellitus without complications: Secondary | ICD-10-CM | POA: Diagnosis not present

## 2020-06-14 NOTE — Progress Notes (Signed)
Gynecologic Oncology Follow-up Note  Chief Complaint:  Chief Complaint  Patient presents with  . Endometrial cancer ([place])    Follow up    Assessment/Plan:  [name]  is a 70 y.o.  year old with clinical stage I grade 1 endometrioid endometrial cancer, non-operative candidate s/p D&C and IUD (progestin releasing) on 08/20/19. Complete clinical response to progestin therapy with negative repeat sampling in December, 2020.  Remains symptom free. Recommend repeat evaluation in 6 months. Repeat biopsy only if new bleeding symptoms. Recommend replace IUD in August, 2025.  HPI: [name] is a 70 year old P2 at the request of Dr Corinna Capra for grade 1 endometrioid endometrial cancer, not an operative candidate due to extreme obesity and complex prior abdominal surgical history (attempted robotic procedure aborted).   Patient reports a history of postmenopausal bleeding since July 2020.  Is been persistent low low level bleeding, spotting but no heavy passage of clots.  She reported this to her gynecologist who ordered a transvaginal ultrasound on June 24, 2019.  This revealed a uterus measuring 6.7 x 3.4 x 5.5 cm with an endometrial thickness of 1.4 cm.  The ovaries were not visualized.  She was taken to the operating room for a curettage and polypectomy on July 17, 2019 which revealed FIGO grade 1 endometrioid adenocarcinoma.  Patient has a complex medical history.  She is morbidly obese with a BMI of 48 kg/m.  She experienced ruptured diverticulitis in 1997 which required a diverting colostomy and Hartman's pouch.  She 6 weeks later she was taken to the operating room for laparotomy and reversal of Hartman's.  She reports that the surgeon described her abdomen is looking like a gallon of superglue had been poured inside her.  It was an extensively long surgery.  She is unsure if additional bowel resections were necessary.  She had a 1 month hospitalization following this reversal of colostomy.   She developed a bowel obstruction during that hospitalization and required reoperation to resolve this.  In 2000 she developed a growth on an ovary (she cannot remember which side).  For this she underwent laparotomy and oophorectomy.  This surgery was performed in [place].  She was planning to have a hysterectomy at that procedure, however the surgeon aborted plans for hysterectomy and performed only oophorectomy after encountering significant adhesive disease.  She states that he told her surgery would have needed to the last 6 to 8 hours in order to perform hysterectomy due to the adhesive disease.  The patient has type 2 diabetes mellitus and has had this for greater than 30 years.  She reports having no endorgan disease.  Her hemoglobin A1c was 5.4% in May 2020.  Her family history is significant for a father with skin cancer.  She has 3 sisters with cancer including one with endometrial cancer who is obese, a sister with stage II 3 breast cancer, and multiple myeloma.  She has had 2 prior vaginal deliveries.  She lives in [place].  On August 20, 2019 she underwent an attempt at a robotic assisted total hysterectomy BSO and sentinel lymph node biopsy.  The surgery was aborted after laparoscopic entry into the left upper quadrant encountered dense adhesions throughout the entirety of the abdomen which included dense colonic and small bowel adhesions to the anterior abdominal wall.  The density of these adhesions and their comprehensive nature across the entirety of her abdominal wall meant that it was not possible to place additional laparoscopic ports to perform  adhesio-lysis.  She would have required a laparotomy in order to achieve hysterectomy, and the patient had declined this preoperatively, and had elected to proceed with D&C with progestin releasing IUD placement if hysterectomy was not possible via minimally invasive surgical route.  Therefore we aborted the laparoscopic procedure and  performed a D&C procedure with placement of a progestin releasing IUD.  Final pathology from the [facility] specimen showed adenocarcinoma favor endometrial primary.  It was difficult to grade the specimen due to his fragmented nature.  Interval Hx:  She reported that since placement of the IUD she has had no further vaginal bleeding.  Repeat biopsy (office) sampling in December, 2020 showed complete clinical response with no residual carcinoma.   She continues to be free of bleeding symptoms.  Current Meds:  Outpatient Encounter Medications as of 06/14/2020  Medication Sig  . allopurinol (ZYLOPRIM) 300 MG tablet Take 300 mg by mouth daily.  Marland Kitchen atorvastatin (LIPITOR) 40 MG tablet Take 40 mg by mouth every evening.   . Carboxymethylcellul-Glycerin (LUBRICATING EYE DROPS OP) Place 1 drop into both eyes daily as needed (dry eyes).  . Cholecalciferol (VITAMIN D3 PO) Take 1 capsule by mouth every evening.   . Coenzyme Q10 (COQ10) 100 MG CAPS Take 100 mg by mouth every evening.  . diphenhydrAMINE (BENADRYL) 25 mg capsule Take 50 mg by mouth at bedtime.  . famotidine (PEPCID) 20 MG tablet Take 20 mg by mouth daily.   Marland Kitchen GLUCOSAMINE-CHONDROITIN PO Take 1 tablet by mouth 2 (two) times daily.  Marland Kitchen ibuprofen (ADVIL) 200 MG tablet Take 800 mg by mouth every 6 (six) hours as needed for headache or moderate pain.  Marland Kitchen ibuprofen (ADVIL) 600 MG tablet Take 1 tablet (600 mg total) by mouth every 6 (six) hours as needed for moderate pain. For AFTER surgery only  . insulin NPH-regular Human (70-30) 100 UNIT/ML injection Inject 75 Units into the skin 2 (two) times daily.   Marland Kitchen KRILL OIL PO Take 1 capsule by mouth daily.  Marland Kitchen losartan (COZAAR) 100 MG tablet Take 100 mg by mouth every evening.   . Multiple Vitamins-Minerals (MULTIVITAMIN WITH MINERALS) tablet Take 1 tablet by mouth daily.  Marland Kitchen aspirin 325 MG tablet Take 325 mg by mouth daily. (Patient not taking: Reported on 06/14/2020)  . levonorgestrel (MIRENA) 20 MCG/24HR IUD  1 Intra Uterine Device (1 each total) by Intrauterine route once for 1 dose. To be placed at [facility] on 8/20   No facility-administered encounter medications on file as of 06/14/2020.    Allergy:  Allergies  Allergen Reactions  . Actos [Pioglitazone]     Unknown reaction  . Lisinopril Cough  . Metformin And Related     Gas     Social Hx:   Social History   Socioeconomic History  . Marital status: Married    Spouse name: Not on file  . Number of children: Not on file  . Years of education: Not on file  . Highest education level: Not on file  Occupational History  . Not on file  Tobacco Use  . Smoking status: Former Smoker    Packs/day: 3.00    Years: 22.00    Pack years: 66.00    Types: Cigarettes    Quit date: 06/08/1987    Years since quitting: 33.0  . Smokeless tobacco: Never Used  Vaping Use  . Vaping Use: Never used  Substance and Sexual Activity  . Alcohol use: Yes    Comment: twice per week  .  Drug use: No  . Sexual activity: Yes    Birth control/protection: Post-menopausal  Other Topics Concern  . Not on file  Social History Narrative  . Not on file   Social Determinants of Health   Financial Resource Strain:   . Difficulty of Paying Living Expenses:   Food Insecurity:   . Worried About Programme researcher, broadcasting/film/video in the Last Year:   . Barista in the Last Year:   Transportation Needs:   . Freight forwarder (Medical):   Marland Kitchen Lack of Transportation (Non-Medical):   Physical Activity:   . Days of Exercise per Week:   . Minutes of Exercise per Session:   Stress:   . Feeling of Stress :   Social Connections:   . Frequency of Communication with Friends and Family:   . Frequency of Social Gatherings with Friends and Family:   . Attends Religious Services:   . Active Member of Clubs or Organizations:   . Attends Banker Meetings:   Marland Kitchen Marital Status:   Intimate Partner Violence:   . Fear of Current or Ex-Partner:   . Emotionally  Abused:   Marland Kitchen Physically Abused:   . Sexually Abused:     Past Surgical Hx:  Past Surgical History:  Procedure Laterality Date  . CARPAL TUNNEL RELEASE Right   . COLON SURGERY    . COLONOSCOPY     x2  . COLOSTOMY    . COLOSTOMY REVERSAL    . DILATATION & CURETTAGE/HYSTEROSCOPY WITH MYOSURE N/A 07/17/2019   Procedure: DILATATION & CURETTAGE/HYSTEROSCOPY WITH MYOSURE;  Surgeon: Candice Camp, MD;  Location: [facility];  Service: Gynecology;  Laterality: N/A;  . OOPHORECTOMY     unilateral  . PILONIDAL CYST EXCISION    . ROBOTIC ASSISTED TOTAL HYSTERECTOMY WITH BILATERAL SALPINGO OOPHERECTOMY N/A 08/20/2019   Procedure: DIAGNSOTIC LAPAROSCOPY, DILATION AND CURETTAGE, IUD PLACEMENT ;  Surgeon: Adolphus Birchwood, MD;  Location: [facility];  Service: Gynecology;  Laterality: N/A;  . SKIN CANCER EXCISION      Past Medical Hx:  Past Medical History:  Diagnosis Date  . Arthritis   . Carpal tunnel syndrome    bilateral, had surgery on right hand  . Diabetes mellitus without complication (HCC)   . Diverticulitis   . Endometrial cancer (HCC)   . GERD (gastroesophageal reflux disease)   . Gout   . History of iron deficiency   . Hyperlipidemia   . Hypertension   . Skin cancer     Past Gynecological History:  See HPI.  No LMP recorded. Patient is postmenopausal.  Family Hx:  Family History  Problem Relation Age of Onset  . Diabetes Sister   . Skin cancer Father     Review of Systems:  Constitutional  Feels well,   ENT Normal appearing ears and nares bilaterally Skin/Breast  No rash, sores, jaundice, itching, dryness Cardiovascular  No chest pain, shortness of breath, or edema  Pulmonary  No cough or wheeze.  Gastro Intestinal  No nausea, vomitting, or diarrhoea. No bright red blood per rectum, no abdominal pain, change in bowel movement, or constipation.  Genito Urinary  No frequency, urgency, dysuria, no bleeding Musculo Skeletal  No myalgia, arthralgia, joint  swelling or pain  Neurologic  No weakness, numbness, change in gait,  Psychology  No depression, anxiety, insomnia.   Vitals:  Blood pressure (!) 174/68, pulse 83, temperature 98.5 F (36.9 C), temperature source Oral, resp. rate 15, height 5' 5.5" (1.664  m), weight 295 lb 9.6 oz (134.1 kg), SpO2 99 %.  Physical Exam: WD in NAD Neck  Supple NROM, without any enlargements.  Lymph Node Survey No cervical supraclavicular or inguinal adenopathy Cardiovascular  Pulse normal rate, regularity and rhythm. S1 and S2 normal.  Lungs  Clear to auscultation bilateraly, without wheezes/crackles/rhonchi. Good air movement.  Skin  No rash/lesions/breakdown  Psychiatry  Alert and oriented to person, place, and time  Abdomen  Normoactive bowel sounds, abdomen soft, non-tender and obese without evidence of hernia.  Back No CVA tenderness Genito Urinary  Vulva/vagina: Normal external female genitalia.  No lesions. No discharge or bleeding.  Bladder/urethra:  No lesions or masses, well supported bladder  Vagina: normal, no lesions  Cervix: Normal appearing, no lesions. Straightforward to visualize with green speculum. IUD strings in place from os.   Uterus: slightly bulky, mobile, no parametrial involvement or nodularity.  Adnexa: no palpable masses. Rectal  deferred Extremities  No bilateral cyanosis, clubbing or edema.   Thereasa Solo, MD  06/14/2020, 2:48 PM

## 2020-06-14 NOTE — Patient Instructions (Signed)
Please notify Dr Demitrus Francisco at phone number [phone] if you notice vaginal bleeding, new pelvic or abdominal pains, bloating, feeling full easy, or a change in bladder or bowel function.  Please return to see Dr Nell Gales in 6 months.  

## 2020-06-21 DIAGNOSIS — Z794 Long term (current) use of insulin: Secondary | ICD-10-CM | POA: Diagnosis not present

## 2020-06-21 DIAGNOSIS — E119 Type 2 diabetes mellitus without complications: Secondary | ICD-10-CM | POA: Diagnosis not present

## 2020-06-28 DIAGNOSIS — E7800 Pure hypercholesterolemia, unspecified: Secondary | ICD-10-CM | POA: Diagnosis not present

## 2020-06-28 DIAGNOSIS — E1169 Type 2 diabetes mellitus with other specified complication: Secondary | ICD-10-CM | POA: Diagnosis not present

## 2020-06-28 DIAGNOSIS — M170 Bilateral primary osteoarthritis of knee: Secondary | ICD-10-CM | POA: Diagnosis not present

## 2020-06-28 DIAGNOSIS — I10 Essential (primary) hypertension: Secondary | ICD-10-CM | POA: Diagnosis not present

## 2020-06-28 DIAGNOSIS — Z794 Long term (current) use of insulin: Secondary | ICD-10-CM | POA: Diagnosis not present

## 2020-06-28 DIAGNOSIS — M109 Gout, unspecified: Secondary | ICD-10-CM | POA: Diagnosis not present

## 2020-07-06 ENCOUNTER — Ambulatory Visit (INDEPENDENT_AMBULATORY_CARE_PROVIDER_SITE_OTHER): Payer: Medicare Other

## 2020-07-06 ENCOUNTER — Encounter: Payer: Self-pay | Admitting: Orthopaedic Surgery

## 2020-07-06 ENCOUNTER — Ambulatory Visit: Payer: Self-pay

## 2020-07-06 ENCOUNTER — Ambulatory Visit (INDEPENDENT_AMBULATORY_CARE_PROVIDER_SITE_OTHER): Payer: Medicare Other | Admitting: Orthopaedic Surgery

## 2020-07-06 ENCOUNTER — Other Ambulatory Visit: Payer: Self-pay

## 2020-07-06 VITALS — Ht 64.5 in | Wt 294.0 lb

## 2020-07-06 DIAGNOSIS — M25562 Pain in left knee: Secondary | ICD-10-CM | POA: Diagnosis not present

## 2020-07-06 DIAGNOSIS — G8929 Other chronic pain: Secondary | ICD-10-CM | POA: Diagnosis not present

## 2020-07-06 DIAGNOSIS — M25561 Pain in right knee: Secondary | ICD-10-CM | POA: Diagnosis not present

## 2020-07-06 DIAGNOSIS — M1712 Unilateral primary osteoarthritis, left knee: Secondary | ICD-10-CM

## 2020-07-06 DIAGNOSIS — M1711 Unilateral primary osteoarthritis, right knee: Secondary | ICD-10-CM | POA: Diagnosis not present

## 2020-07-06 DIAGNOSIS — M170 Bilateral primary osteoarthritis of knee: Secondary | ICD-10-CM

## 2020-07-06 MED ORDER — LIDOCAINE HCL 1 % IJ SOLN
2.0000 mL | INTRAMUSCULAR | Status: AC | PRN
  Administered 2020-07-06: 2 mL

## 2020-07-06 MED ORDER — METHYLPREDNISOLONE ACETATE 40 MG/ML IJ SUSP
80.0000 mg | INTRAMUSCULAR | Status: AC | PRN
  Administered 2020-07-06: 80 mg via INTRA_ARTICULAR

## 2020-07-06 MED ORDER — BUPIVACAINE HCL 0.5 % IJ SOLN
2.0000 mL | INTRAMUSCULAR | Status: AC | PRN
  Administered 2020-07-06: 2 mL via INTRA_ARTICULAR

## 2020-07-06 NOTE — Progress Notes (Signed)
Office Visit Note   Patient: [name]           Date of Birth: [birth year]           MRN: [id number] Visit Date: 07/06/2020              Requested by: Aretta Nip, [place],  [place] [address] PCP: Aretta Nip, MD   Assessment & Plan: Visit Diagnoses:  1. Chronic pain of both knees   2. Bilateral primary osteoarthritis of knee   3. Morbid (severe) obesity due to excess calories (HCC)     Plan: End-stage osteoarthritis both knees.  Long and in-depth discussion regarding treatment options and eluding NSAIDs, injections and exercises.  Also long discussion regarding her BMI of 49 with weight loss options.  At this point she is not interested in further weight loss counseling and would like to have a cortisone injection her right knee.  She is aware that knee replacement surgery is out of the question with her elevated BMI.  Will inject her right knee as it is the more symptomatic knee today and see her back in 2 weeks to consider injecting the left knee.  She is aware that these are temporary measures  Follow-Up Instructions: Return in about 2 weeks (around 07/20/2020).   Orders:  Orders Placed This Encounter  Procedures  . Large Joint Inj: R knee  . XR KNEE 3 VIEW LEFT  . XR KNEE 3 VIEW RIGHT   No orders of the defined types were placed in this encounter.     Procedures: Large Joint Inj: R knee on 07/06/2020 9:13 AM Indications: pain and diagnostic evaluation Details: 25 G 1.5 in needle, anteromedial approach  Arthrogram: No  Medications: 2 mL lidocaine 1 %; 2 mL bupivacaine 0.5 %; 80 mg methylPREDNISolone acetate 40 MG/ML Procedure, treatment alternatives, risks and benefits explained, specific risks discussed. Consent was given by the patient. Immediately prior to procedure a time out was called to verify the correct patient, procedure, equipment, support staff and site/side marked as required. Patient was prepped and draped in the usual  sterile fashion.       Clinical Data: No additional findings.   Subjective: Chief Complaint  Patient presents with  . Right Knee - Pain  . Left Knee - Pain  Patient presents today for bilateral knee pain. She said that they have hurt for years, but worsening. Both hurt about the same. The pain is located anteriorly. No swelling, grinding, or giving way. They do feel weak. She takes Ibuprofen as needed. No previous treatment. She was referred by her PCP.  Long history of bilateral knee pain.  She thinks she may have had an injection 20 years ago that may have provided some temporary relief.  She is also aware of her weight and has tried multiple modalities with weight loss attempts including counseling, and numerous diets.  These have been unsuccessful.  She is not interested in any further counseling or clinic evaluation regarding her weight at this point. She is an insulin-dependent diabetic  HPI  Review of Systems   Objective: Vital Signs: Ht 5' 4.5" (1.638 m)   Wt 294 lb (133.4 kg)   BMI 49.69 kg/m   Physical Exam Constitutional:      Appearance: She is well-developed.  Eyes:     Pupils: Pupils are equal, round, and reactive to light.  Pulmonary:     Effort: Pulmonary effort is normal.  Skin:  General: Skin is warm and dry.  Neurological:     Mental Status: She is alert and oriented to person, place, and time.  Psychiatric:        Behavior: Behavior normal.     Ortho Exam BMI 49-50.  Large knees.  Increased varus with weightbearing and does have duck waddling gait based on her knee pain.  Knees were not effused with full extension about 100 degrees of flexion.  No instability.  Predominately medial joint pain.  Positive patella crepitation with minimal pain with compression.  Very mild lateral joint pain.  Feet were warm.  I could not palpate pulses.  Some venous stasis changes.  Sensation is intact.  She denies diabetic neuropathy  Specialty Comments:  No  specialty comments available.  Imaging: XR KNEE 3 VIEW LEFT  Result Date: 07/06/2020 Films of the left knee obtained in several projections standing.  There is end-stage osteoarthritis in all 3 compartments but predominantly medially where there is bone-on-bone.  There is subchondral sclerosis and peripheral osteophytes.  No ectopic calcification.  3 to 4 degrees of varus  XR KNEE 3 VIEW RIGHT  Result Date: 07/06/2020 Films of the right knee were obtained in several projections standing.  These were compared to the left knee with similar findings.  There is end-stage osteoarthritis in all 3 compartments but particularly medially where there is bone-on-bone.  Subchondral sclerosis on both sides of the joint and peripheral osteophytes.  There is about 4 to 5 degrees of varus.  No acute changes or ectopic calcification    PMFS History: Patient Active Problem List   Diagnosis Date Noted  . Bilateral primary osteoarthritis of knee 07/06/2020  . Morbid (severe) obesity due to excess calories ([place]) 07/06/2020  . Endometrial cancer ([place]) 08/20/2019  . Pelvic adhesive disease 08/20/2019  . SBO (small bowel obstruction) ([place]) 06/07/2016  . Lung infiltrate 06/07/2016  . Diabetes mellitus with complication ([place]) 50/93/2671  . HLD (hyperlipidemia) 06/07/2016  . Essential hypertension 06/07/2016  . Gout 06/07/2016   Past Medical History:  Diagnosis Date  . Arthritis   . Carpal tunnel syndrome    bilateral, had surgery on right hand  . Diabetes mellitus without complication ([place])   . Diverticulitis   . Endometrial cancer ([place])   . GERD (gastroesophageal reflux disease)   . Gout   . History of iron deficiency   . Hyperlipidemia   . Hypertension   . Skin cancer     Family History  Problem Relation Age of Onset  . Diabetes Sister   . Skin cancer Father     Past Surgical History:  Procedure Laterality Date  . CARPAL TUNNEL RELEASE Right   . COLON SURGERY    . COLONOSCOPY     x2  .  COLOSTOMY    . COLOSTOMY REVERSAL    . DILATATION & CURETTAGE/HYSTEROSCOPY WITH MYOSURE N/A 07/17/2019   Procedure: DILATATION & CURETTAGE/HYSTEROSCOPY WITH MYOSURE;  Surgeon: Louretta Shorten, MD;  Location: [facility];  Service: Gynecology;  Laterality: N/A;  . OOPHORECTOMY     unilateral  . PILONIDAL CYST EXCISION    . ROBOTIC ASSISTED TOTAL HYSTERECTOMY WITH BILATERAL SALPINGO OOPHERECTOMY N/A 08/20/2019   Procedure: DIAGNSOTIC LAPAROSCOPY, DILATION AND CURETTAGE, IUD PLACEMENT ;  Surgeon: Everitt Amber, MD;  Location: [facility];  Service: Gynecology;  Laterality: N/A;  . SKIN CANCER EXCISION     Social History   Occupational History  . Not on file  Tobacco Use  . Smoking status: Former  Smoker    Packs/day: 3.00    Years: 22.00    Pack years: 66.00    Types: Cigarettes    Quit date: 06/08/1987    Years since quitting: 33.1  . Smokeless tobacco: Never Used  Vaping Use  . Vaping Use: Never used  Substance and Sexual Activity  . Alcohol use: Yes    Comment: twice per week  . Drug use: No  . Sexual activity: Yes    Birth control/protection: Post-menopausal

## 2020-07-20 ENCOUNTER — Ambulatory Visit (INDEPENDENT_AMBULATORY_CARE_PROVIDER_SITE_OTHER): Payer: Medicare Other | Admitting: Orthopaedic Surgery

## 2020-07-20 ENCOUNTER — Telehealth: Payer: Self-pay

## 2020-07-20 ENCOUNTER — Encounter: Payer: Self-pay | Admitting: Orthopaedic Surgery

## 2020-07-20 ENCOUNTER — Other Ambulatory Visit: Payer: Self-pay

## 2020-07-20 VITALS — Ht 64.5 in | Wt 294.0 lb

## 2020-07-20 DIAGNOSIS — M170 Bilateral primary osteoarthritis of knee: Secondary | ICD-10-CM

## 2020-07-20 DIAGNOSIS — M1711 Unilateral primary osteoarthritis, right knee: Secondary | ICD-10-CM

## 2020-07-20 DIAGNOSIS — M1712 Unilateral primary osteoarthritis, left knee: Secondary | ICD-10-CM | POA: Diagnosis not present

## 2020-07-20 MED ORDER — BUPIVACAINE HCL 0.5 % IJ SOLN
2.0000 mL | INTRAMUSCULAR | Status: AC | PRN
  Administered 2020-07-20: 2 mL via INTRA_ARTICULAR

## 2020-07-20 MED ORDER — LIDOCAINE HCL 1 % IJ SOLN
2.0000 mL | INTRAMUSCULAR | Status: AC | PRN
  Administered 2020-07-20: 2 mL

## 2020-07-20 MED ORDER — METHYLPREDNISOLONE ACETATE 40 MG/ML IJ SUSP
80.0000 mg | INTRAMUSCULAR | Status: AC | PRN
  Administered 2020-07-20: 80 mg via INTRA_ARTICULAR

## 2020-07-20 NOTE — Telephone Encounter (Signed)
Please precert for bilateral visco injections. This is Dr.Whitfield's patient. Thanks! 

## 2020-07-20 NOTE — Progress Notes (Signed)
Office Visit Note   Patient: [name]           Date of Birth: 20-Dec-1950           MRN: [id number] Visit Date: 07/20/2020              Requested by: Aretta Nip, [place],  [place] [address] PCP: Aretta Nip, MD   Assessment & Plan: Visit Diagnoses:  1. Bilateral primary osteoarthritis of knee   2. Morbid (severe) obesity due to excess calories ([place])     Plan: Seen several weeks ago for evaluation of bilateral knee osteoarthritis.  Injected the right knee with cortisone with moderate response.  She would like to have the left knee injected today.  We had a long discussion regarding viscosupplementation and she would like to look into that. This patient is diagnosed with osteoarthritis of the knee(s).    Radiographs show evidence of joint space narrowing, osteophytes, subchondral sclerosis and/or subchondral cysts.  This patient has knee pain which interferes with functional and activities of daily living.    This patient has experienced inadequate response, adverse effects and/or intolerance with conservative treatments such as acetaminophen, NSAIDS, topical creams, physical therapy or regular exercise, knee bracing and/or weight loss.   This patient has experienced inadequate response or has a contraindication to intra articular steroid injections for at least 3 months.   This patient is not scheduled to have a total knee replacement within 6 months of starting treatment with viscosupplementation.   Follow-Up Instructions: Return Pre-CERT Visco supplementation.   Orders:  Orders Placed This Encounter  Procedures   Large Joint Inj: L knee   No orders of the defined types were placed in this encounter.     Procedures: Large Joint Inj: L knee on 07/20/2020 9:07 AM Indications: pain and diagnostic evaluation Details: 25 G 1.5 in needle, anteromedial approach  Arthrogram: No  Medications: 2 mL lidocaine 1 %; 2 mL bupivacaine 0.5 %;  80 mg methylPREDNISolone acetate 40 MG/ML Procedure, treatment alternatives, risks and benefits explained, specific risks discussed. Consent was given by the patient. Patient was prepped and draped in the usual sterile fashion.       Clinical Data: No additional findings.   Subjective: Chief Complaint  Patient presents with   Right Knee - Pain   Left Knee - Pain  Patient presents today for follow up on both of her knees. She was here on 07/06/2020 and received a right knee cortisone injection. She states that she thinks the injection may have helped, but is not sure. She does wasn't to have the left knee injected today. She is taking Ibuprofen for pain. She is diabetic.  HPI  Review of Systems   Objective: Vital Signs: Ht 5' 4.5" (1.638 m)    Wt 294 lb (133.4 kg)    BMI 49.69 kg/m   Physical Exam Constitutional:      Appearance: She is well-developed.  Eyes:     Pupils: Pupils are equal, round, and reactive to light.  Pulmonary:     Effort: Pulmonary effort is normal.  Skin:    General: Skin is warm and dry.  Neurological:     Mental Status: She is alert and oriented to person, place, and time.  Psychiatric:        Behavior: Behavior normal.     Ortho Exam awake alert and oriented x3.  Comfortable sitting.  Positive crepitation with flexion extension of left knee about the  patella.  Mild medial lateral joint pain anteriorly.  Full extension about 90 degrees of flexion at which point calf with touch thigh.  No instability.  No obvious effusion but knees are quite large Specialty Comments:  No specialty comments available.  Imaging: No results found.   PMFS History: Patient Active Problem List   Diagnosis Date Noted   Bilateral primary osteoarthritis of knee 07/06/2020   Morbid (severe) obesity due to excess calories ([place]) 07/06/2020   Endometrial cancer ([place]) 08/20/2019   Pelvic adhesive disease 08/20/2019   SBO (small bowel obstruction) ([place]) 06/07/2016     Lung infiltrate 06/07/2016   Diabetes mellitus with complication ([place]) 16/10/9603   HLD (hyperlipidemia) 06/07/2016   Essential hypertension 06/07/2016   Gout 06/07/2016   Past Medical History:  Diagnosis Date   Arthritis    Carpal tunnel syndrome    bilateral, had surgery on right hand   Diabetes mellitus without complication ([facility])    Diverticulitis    Endometrial cancer ([facility])    GERD (gastroesophageal reflux disease)    Gout    History of iron deficiency    Hyperlipidemia    Hypertension    Skin cancer     Family History  Problem Relation Age of Onset   Diabetes Sister    Skin cancer Father     Past Surgical History:  Procedure Laterality Date   CARPAL TUNNEL RELEASE Right    COLON SURGERY     COLONOSCOPY     x2   COLOSTOMY     COLOSTOMY REVERSAL     DILATATION & CURETTAGE/HYSTEROSCOPY WITH MYOSURE N/A 07/17/2019   Procedure: DILATATION & CURETTAGE/HYSTEROSCOPY WITH MYOSURE;  Surgeon: Louretta Shorten, MD;  Location: [facility];  Service: Gynecology;  Laterality: N/A;   OOPHORECTOMY     unilateral   PILONIDAL CYST EXCISION     ROBOTIC ASSISTED TOTAL HYSTERECTOMY WITH BILATERAL SALPINGO OOPHERECTOMY N/A 08/20/2019   Procedure: DIAGNSOTIC LAPAROSCOPY, DILATION AND CURETTAGE, IUD PLACEMENT ;  Surgeon: Everitt Amber, MD;  Location: [facility];  Service: Gynecology;  Laterality: N/A;   SKIN CANCER EXCISION     Social History   Occupational History   Not on file  Tobacco Use   Smoking status: Former Smoker    Packs/day: 3.00    Years: 22.00    Pack years: 66.00    Types: Cigarettes    Quit date: 06/08/1987    Years since quitting: 33.1   Smokeless tobacco: Never Used  Vaping Use   Vaping Use: Never used  Substance and Sexual Activity   Alcohol use: Yes    Comment: twice per week   Drug use: No   Sexual activity: Yes    Birth control/protection: Post-menopausal

## 2020-07-22 NOTE — Telephone Encounter (Signed)
Noted  

## 2020-07-29 ENCOUNTER — Telehealth: Payer: Self-pay

## 2020-07-29 NOTE — Telephone Encounter (Signed)
Submitted VOB, Orthovisc series, bilateral knee.

## 2020-08-01 ENCOUNTER — Telehealth: Payer: Self-pay

## 2020-08-01 NOTE — Telephone Encounter (Signed)
Approved for Orthovisc-Bilateral knee Dr. Vicenta Dunning and Bill Covered @ 100% 2nd insurance to pick up cost after medicare No prior auth required

## 2020-08-01 NOTE — Telephone Encounter (Signed)
Pt was called and informed and appointments were scheduled

## 2020-08-23 ENCOUNTER — Ambulatory Visit (INDEPENDENT_AMBULATORY_CARE_PROVIDER_SITE_OTHER): Payer: Medicare Other | Admitting: Orthopaedic Surgery

## 2020-08-23 ENCOUNTER — Encounter: Payer: Self-pay | Admitting: Orthopaedic Surgery

## 2020-08-23 ENCOUNTER — Other Ambulatory Visit: Payer: Self-pay

## 2020-08-23 VITALS — Ht 64.5 in | Wt 294.0 lb

## 2020-08-23 DIAGNOSIS — M1711 Unilateral primary osteoarthritis, right knee: Secondary | ICD-10-CM | POA: Diagnosis not present

## 2020-08-23 DIAGNOSIS — M1712 Unilateral primary osteoarthritis, left knee: Secondary | ICD-10-CM

## 2020-08-23 DIAGNOSIS — M170 Bilateral primary osteoarthritis of knee: Secondary | ICD-10-CM

## 2020-08-23 MED ORDER — HYALURONAN 30 MG/2ML IX SOSY
30.0000 mg | PREFILLED_SYRINGE | INTRA_ARTICULAR | Status: AC | PRN
  Administered 2020-08-23: 30 mg via INTRA_ARTICULAR

## 2020-08-23 MED ORDER — LIDOCAINE HCL 1 % IJ SOLN
2.0000 mL | INTRAMUSCULAR | Status: AC | PRN
  Administered 2020-08-23: 2 mL

## 2020-08-23 NOTE — Progress Notes (Signed)
Office Visit Note   Patient: [name]           Date of Birth: 08-Apr-1950           MRN: [id number] Visit Date: 08/23/2020              Requested by: Aretta Nip, [place],  [place] [address] PCP: Aretta Nip, MD   Assessment & Plan: Visit Diagnoses:  1. Bilateral primary osteoarthritis of knee     Plan:  #1: Bilateral Orthovisc injections were accomplished atraumatically.  She tolerated the procedure well.   #2: Follow back up with [place] in 1 week for the second of 3 Orthovisc injections  Follow-Up Instructions: Return in about 1 week (around 08/30/2020).   Orders:  No orders of the defined types were placed in this encounter.  No orders of the defined types were placed in this encounter.     Procedures: Large Joint Inj: bilateral knee on 08/23/2020 5:01 PM Indications: pain and joint swelling Details: 25 G 1.5 in needle, anteromedial approach  Arthrogram: No  Medications (Right): 2 mL lidocaine 1 %; 30 mg Hyaluronan 30 MG/2ML Medications (Left): 2 mL lidocaine 1 %; 30 mg Hyaluronan 30 MG/2ML Outcome: tolerated well, no immediate complications Procedure, treatment alternatives, risks and benefits explained, specific risks discussed. Consent was given by the patient. Immediately prior to procedure a time out was called to verify the correct patient, procedure, equipment, support staff and site/side marked as required. Patient was prepped and draped in the usual sterile fashion.       Clinical Data: No additional findings.   Subjective: Chief Complaint  Patient presents with  . Right Knee - Follow-up    Bilateral orthovisc started 08/23/2020  . Left Knee - Follow-up  Patient presents today for the first Orthovisc injections bilaterally.   HPI  [name] is a 70 year old white female who presents today with bilateral knee pain.  She has had corticosteroids in the past and her first one was on July 06, 2020 to the right knee.  The  left knee was injected on July 20, 2020.  She still is having some difficulties with it and the question was whether to consider Visco supplementation.  She is here today for her visco supplementation to both knees.   Review of Systems  Constitutional: Negative for fatigue.  HENT: Negative for ear pain.   Eyes: Negative for pain.  Respiratory: Negative for shortness of breath.   Cardiovascular: Negative for leg swelling.  Gastrointestinal: Negative for constipation and diarrhea.  Endocrine: Negative for cold intolerance and heat intolerance.  Genitourinary: Negative for difficulty urinating.  Musculoskeletal: Negative for joint swelling.  Skin: Negative for rash.  Allergic/Immunologic: Negative for food allergies.  Neurological: Negative for weakness.  Psychiatric/Behavioral: Negative for sleep disturbance.     Objective: Vital Signs: Ht 5' 4.5" (1.638 m)   Wt 294 lb (133.4 kg)   BMI 49.69 kg/m   Physical Exam Constitutional:      Appearance: Normal appearance. She is well-developed and normal weight.  HENT:     Head: Normocephalic.  Eyes:     Pupils: Pupils are equal, round, and reactive to light.  Pulmonary:     Effort: Pulmonary effort is normal.  Skin:    General: Skin is warm and dry.  Neurological:     Mental Status: She is alert and oriented to person, place, and time.  Psychiatric:        Mood and Affect:  Mood normal.        Behavior: Behavior normal.        Thought Content: Thought content normal.        Judgment: Judgment normal.     Ortho Exam  Exam today reveals the knees to be not warm or edematous.  Negative just a trace effusion at best bilaterally.  Calves are supple nontender.  Neurovascular intact.  Specialty Comments:  No specialty comments available.  Imaging: No results found.   PMFS History: Current Outpatient Medications  Medication Sig Dispense Refill  . allopurinol (ZYLOPRIM) 300 MG tablet Take 300 mg by mouth daily.    Marland Kitchen aspirin  325 MG tablet Take 325 mg by mouth daily.     Marland Kitchen atorvastatin (LIPITOR) 40 MG tablet Take 40 mg by mouth every evening.     . Carboxymethylcellul-Glycerin (LUBRICATING EYE DROPS OP) Place 1 drop into both eyes daily as needed (dry eyes).    . Cholecalciferol (VITAMIN D3 PO) Take 1 capsule by mouth every evening.     . Coenzyme Q10 (COQ10) 100 MG CAPS Take 100 mg by mouth every evening.    . diphenhydrAMINE (BENADRYL) 25 mg capsule Take 50 mg by mouth at bedtime.    . famotidine (PEPCID) 20 MG tablet Take 20 mg by mouth daily.     Marland Kitchen GLUCOSAMINE-CHONDROITIN PO Take 1 tablet by mouth 2 (two) times daily.    Marland Kitchen ibuprofen (ADVIL) 200 MG tablet Take 800 mg by mouth every 6 (six) hours as needed for headache or moderate pain.    Marland Kitchen ibuprofen (ADVIL) 600 MG tablet Take 1 tablet (600 mg total) by mouth every 6 (six) hours as needed for moderate pain. For AFTER surgery only 30 tablet 0  . insulin NPH-regular Human (70-30) 100 UNIT/ML injection Inject 75 Units into the skin 2 (two) times daily.     Marland Kitchen KRILL OIL PO Take 1 capsule by mouth daily.    Marland Kitchen losartan (COZAAR) 100 MG tablet Take 100 mg by mouth every evening.     . Multiple Vitamins-Minerals (MULTIVITAMIN WITH MINERALS) tablet Take 1 tablet by mouth daily.    Marland Kitchen levonorgestrel (MIRENA) 20 MCG/24HR IUD 1 Intra Uterine Device (1 each total) by Intrauterine route once for 1 dose. To be placed at [facility] on 8/20 1 each 0   No current facility-administered medications for this visit.    Patient Active Problem List   Diagnosis Date Noted  . Bilateral primary osteoarthritis of knee 07/06/2020  . Morbid (severe) obesity due to excess calories ([place]) 07/06/2020  . Endometrial cancer ([place]) 08/20/2019  . Pelvic adhesive disease 08/20/2019  . SBO (small bowel obstruction) ([facility]) 06/07/2016  . Lung infiltrate 06/07/2016  . Diabetes mellitus with complication ([place]) 64/33/2951  . HLD (hyperlipidemia) 06/07/2016  . Essential hypertension 06/07/2016  . Gout  06/07/2016   Past Medical History:  Diagnosis Date  . Arthritis   . Carpal tunnel syndrome    bilateral, had surgery on right hand  . Diabetes mellitus without complication ([place])   . Diverticulitis   . Endometrial cancer ([place])   . GERD (gastroesophageal reflux disease)   . Gout   . History of iron deficiency   . Hyperlipidemia   . Hypertension   . Skin cancer     Family History  Problem Relation Age of Onset  . Diabetes Sister   . Skin cancer Father     Past Surgical History:  Procedure Laterality Date  . CARPAL TUNNEL RELEASE Right   .  COLON SURGERY    . COLONOSCOPY     x2  . COLOSTOMY    . COLOSTOMY REVERSAL    . DILATATION & CURETTAGE/HYSTEROSCOPY WITH MYOSURE N/A 07/17/2019   Procedure: DILATATION & CURETTAGE/HYSTEROSCOPY WITH MYOSURE;  Surgeon: Louretta Shorten, MD;  Location: [facility];  Service: Gynecology;  Laterality: N/A;  . OOPHORECTOMY     unilateral  . PILONIDAL CYST EXCISION    . ROBOTIC ASSISTED TOTAL HYSTERECTOMY WITH BILATERAL SALPINGO OOPHERECTOMY N/A 08/20/2019   Procedure: DIAGNSOTIC LAPAROSCOPY, DILATION AND CURETTAGE, IUD PLACEMENT ;  Surgeon: Everitt Amber, MD;  Location: [facility];  Service: Gynecology;  Laterality: N/A;  . SKIN CANCER EXCISION     Social History   Occupational History  . Not on file  Tobacco Use  . Smoking status: Former Smoker    Packs/day: 3.00    Years: 22.00    Pack years: 66.00    Types: Cigarettes    Quit date: 06/08/1987    Years since quitting: 33.2  . Smokeless tobacco: Never Used  Vaping Use  . Vaping Use: Never used  Substance and Sexual Activity  . Alcohol use: Yes    Comment: twice per week  . Drug use: No  . Sexual activity: Yes    Birth control/protection: Post-menopausal

## 2020-08-30 ENCOUNTER — Encounter: Payer: Self-pay | Admitting: Orthopaedic Surgery

## 2020-08-30 ENCOUNTER — Ambulatory Visit (INDEPENDENT_AMBULATORY_CARE_PROVIDER_SITE_OTHER): Payer: Medicare Other | Admitting: Orthopaedic Surgery

## 2020-08-30 ENCOUNTER — Other Ambulatory Visit: Payer: Self-pay

## 2020-08-30 DIAGNOSIS — M170 Bilateral primary osteoarthritis of knee: Secondary | ICD-10-CM | POA: Diagnosis not present

## 2020-08-30 MED ORDER — HYALURONAN 30 MG/2ML IX SOSY
30.0000 mg | PREFILLED_SYRINGE | INTRA_ARTICULAR | Status: AC | PRN
  Administered 2020-08-30: 30 mg via INTRA_ARTICULAR

## 2020-08-30 MED ORDER — LIDOCAINE HCL 1 % IJ SOLN
2.0000 mL | INTRAMUSCULAR | Status: AC | PRN
  Administered 2020-08-30: 2 mL

## 2020-08-30 NOTE — Progress Notes (Signed)
Office Visit Note   Patient: [name]           Date of Birth: 06/13/1950           MRN: [id number] Visit Date: 08/30/2020              Requested by: Aretta Nip, [place],  [place] [address] PCP: Aretta Nip, MD   Assessment & Plan: Visit Diagnoses:  1. Morbid (severe) obesity due to excess calories ([place])   2. Bilateral primary osteoarthritis of knee     Plan:  #1: Orthovisc injections #2 were given to both knees without difficulty.  Tolerated procedure well. #2: Follow back up in 1 week for the third injection.  Follow-Up Instructions: No follow-ups on file.   Orders:  No orders of the defined types were placed in this encounter.  No orders of the defined types were placed in this encounter.     Procedures: Large Joint Inj: bilateral knee on 08/30/2020 2:49 PM Indications: pain and joint swelling Details: 25 G 1.5 in needle, anteromedial approach  Arthrogram: No  Medications (Right): 2 mL lidocaine 1 %; 30 mg Hyaluronan 30 MG/2ML Medications (Left): 2 mL lidocaine 1 %; 30 mg Hyaluronan 30 MG/2ML Outcome: tolerated well, no immediate complications Procedure, treatment alternatives, risks and benefits explained, specific risks discussed. Consent was given by the patient. Immediately prior to procedure a time out was called to verify the correct patient, procedure, equipment, support staff and site/side marked as required. Patient was prepped and draped in the usual sterile fashion.       Clinical Data: No additional findings.   Subjective: Chief Complaint  Patient presents with  . Left Knee - Follow-up    Bilateral orthovisc started 08/23/2020  . Right Knee - Follow-up  Patient presents today for the second Orthovisc injections bilaterally. She started the injections on 08/23/2020. No changes thus far but would like to continue on.   HPI  Review of Systems  Constitutional: Negative for fatigue.  HENT: Negative for ear  pain.   Eyes: Negative for pain.  Respiratory: Negative for shortness of breath.   Cardiovascular: Negative for leg swelling.  Gastrointestinal: Negative for constipation and diarrhea.  Endocrine: Negative for cold intolerance and heat intolerance.  Genitourinary: Negative for difficulty urinating.  Musculoskeletal: Negative for joint swelling.  Skin: Negative for rash.  Allergic/Immunologic: Negative for food allergies.  Neurological: Negative for weakness.  Psychiatric/Behavioral: Negative for sleep disturbance.     Objective: Vital Signs: Ht 5' 4.5" (1.638 m)   Wt 294 lb (133.4 kg)   BMI 49.69 kg/m   Physical Exam  Ortho Exam  Exam today reveals the knees to be quite benign.  Unable to tell for sure visit has an effusion or not secondary to the size of the knees.  No warmth or erythema.  Specialty Comments:  No specialty comments available.  Imaging: No results found.   PMFS History: Patient Active Problem List   Diagnosis Date Noted  . Bilateral primary osteoarthritis of knee 07/06/2020  . Morbid (severe) obesity due to excess calories ([place]) 07/06/2020  . Endometrial cancer ([place]) 08/20/2019  . Pelvic adhesive disease 08/20/2019  . SBO (small bowel obstruction) ([place]) 06/07/2016  . Lung infiltrate 06/07/2016  . Diabetes mellitus with complication ([facility]) 58/08/9832  . HLD (hyperlipidemia) 06/07/2016  . Essential hypertension 06/07/2016  . Gout 06/07/2016   Past Medical History:  Diagnosis Date  . Arthritis   . Carpal tunnel syndrome  bilateral, had surgery on right hand  . Diabetes mellitus without complication ([place])   . Diverticulitis   . Endometrial cancer ([place])   . GERD (gastroesophageal reflux disease)   . Gout   . History of iron deficiency   . Hyperlipidemia   . Hypertension   . Skin cancer     Family History  Problem Relation Age of Onset  . Diabetes Sister   . Skin cancer Father     Past Surgical History:  Procedure Laterality Date  .  CARPAL TUNNEL RELEASE Right   . COLON SURGERY    . COLONOSCOPY     x2  . COLOSTOMY    . COLOSTOMY REVERSAL    . DILATATION & CURETTAGE/HYSTEROSCOPY WITH MYOSURE N/A 07/17/2019   Procedure: DILATATION & CURETTAGE/HYSTEROSCOPY WITH MYOSURE;  Surgeon: Louretta Shorten, MD;  Location: [facility];  Service: Gynecology;  Laterality: N/A;  . OOPHORECTOMY     unilateral  . PILONIDAL CYST EXCISION    . ROBOTIC ASSISTED TOTAL HYSTERECTOMY WITH BILATERAL SALPINGO OOPHERECTOMY N/A 08/20/2019   Procedure: DIAGNSOTIC LAPAROSCOPY, DILATION AND CURETTAGE, IUD PLACEMENT ;  Surgeon: Everitt Amber, MD;  Location[facility];  Service: Gynecology;  Laterality: N/A;  . SKIN CANCER EXCISION     Social History   Occupational History  . Not on file  Tobacco Use  . Smoking status: Former Smoker    Packs/day: 3.00    Years: 22.00    Pack years: 66.00    Types: Cigarettes    Quit date: 06/08/1987    Years since quitting: 33.2  . Smokeless tobacco: Never Used  Vaping Use  . Vaping Use: Never used  Substance and Sexual Activity  . Alcohol use: Yes    Comment: twice per week  . Drug use: No  . Sexual activity: Yes    Birth control/protection: Post-menopausal

## 2020-08-31 DIAGNOSIS — E162 Hypoglycemia, unspecified: Secondary | ICD-10-CM | POA: Diagnosis not present

## 2020-08-31 DIAGNOSIS — E161 Other hypoglycemia: Secondary | ICD-10-CM | POA: Diagnosis not present

## 2020-09-06 ENCOUNTER — Other Ambulatory Visit: Payer: Self-pay

## 2020-09-06 ENCOUNTER — Ambulatory Visit (INDEPENDENT_AMBULATORY_CARE_PROVIDER_SITE_OTHER): Payer: Medicare Other | Admitting: Orthopaedic Surgery

## 2020-09-06 ENCOUNTER — Encounter: Payer: Self-pay | Admitting: Orthopaedic Surgery

## 2020-09-06 VITALS — Ht 64.5 in | Wt 294.0 lb

## 2020-09-06 DIAGNOSIS — M170 Bilateral primary osteoarthritis of knee: Secondary | ICD-10-CM | POA: Diagnosis not present

## 2020-09-06 MED ORDER — HYALURONAN 30 MG/2ML IX SOSY
30.0000 mg | PREFILLED_SYRINGE | INTRA_ARTICULAR | Status: AC | PRN
  Administered 2020-09-06: 30 mg via INTRA_ARTICULAR

## 2020-09-06 MED ORDER — LIDOCAINE HCL 1 % IJ SOLN
2.0000 mL | INTRAMUSCULAR | Status: AC | PRN
  Administered 2020-09-06: 2 mL

## 2020-09-06 NOTE — Progress Notes (Signed)
Office Visit Note   Patient: [name]           Date of Birth: Sep 02, 1950           MRN: [id number] Visit Date: 09/06/2020              Requested by: Aretta Nip, [place],  [place] [address] PCP: Aretta Nip, MD   Assessment & Plan: Visit Diagnoses:  1. Bilateral primary osteoarthritis of knee     Plan:  #1: Bilateral Orthovisc injections were given today without difficulty.  Tolerated procedure well.  Follow back up as needed   Follow-Up Instructions: No follow-ups on file.   Orders:  No orders of the defined types were placed in this encounter.  No orders of the defined types were placed in this encounter.     Procedures: Large Joint Inj: bilateral knee on 09/06/2020 1:57 PM Indications: pain and joint swelling Details: 25 G 1.5 in needle, anteromedial approach  Arthrogram: No  Medications (Right): 2 mL lidocaine 1 %; 30 mg Hyaluronan 30 MG/2ML Medications (Left): 2 mL lidocaine 1 %; 30 mg Hyaluronan 30 MG/2ML Outcome: tolerated well, no immediate complications Procedure, treatment alternatives, risks and benefits explained, specific risks discussed. Consent was given by the patient. Immediately prior to procedure a time out was called to verify the correct patient, procedure, equipment, support staff and site/side marked as required. Patient was prepped and draped in the usual sterile fashion.       Clinical Data: No additional findings.   Subjective: Chief Complaint  Patient presents with  . Right Knee - Follow-up    Orthovisc bilaterally started 08/23/2020  . Left Knee - Follow-up  Patient presents today for the third Orthovisc injections bilaterally.  HPI  [name] returns today for her final Orthovisc injections to the both knees.  She states that they seem to be better and do not ache as bad.  No problems with the injections in the past.  Review of Systems  Constitutional: Negative for fatigue.  HENT: Negative  for ear pain.   Eyes: Negative for pain.  Respiratory: Negative for shortness of breath.   Cardiovascular: Negative for leg swelling.  Gastrointestinal: Negative for constipation and diarrhea.  Endocrine: Negative for cold intolerance and heat intolerance.  Genitourinary: Negative for difficulty urinating.  Musculoskeletal: Negative for joint swelling.  Skin: Negative for rash.  Allergic/Immunologic: Negative for food allergies.  Neurological: Negative for weakness.  Psychiatric/Behavioral: Negative for sleep disturbance.     Objective: Vital Signs: Ht 5' 4.5" (1.638 m)   Wt 294 lb (133.4 kg)   BMI 49.69 kg/m   Physical Exam  Ortho Exam  Exam today of the both knees are quite benign.  No warmth erythema.  Specialty Comments:  No specialty comments available.  Imaging: No results found.   PMFS History: Current Outpatient Medications  Medication Sig Dispense Refill  . allopurinol (ZYLOPRIM) 300 MG tablet Take 300 mg by mouth daily.    Marland Kitchen aspirin 325 MG tablet Take 325 mg by mouth daily.     Marland Kitchen atorvastatin (LIPITOR) 40 MG tablet Take 40 mg by mouth every evening.     . Carboxymethylcellul-Glycerin (LUBRICATING EYE DROPS OP) Place 1 drop into both eyes daily as needed (dry eyes).    . Cholecalciferol (VITAMIN D3 PO) Take 1 capsule by mouth every evening.     . Coenzyme Q10 (COQ10) 100 MG CAPS Take 100 mg by mouth every evening.    Marland Kitchen  diphenhydrAMINE (BENADRYL) 25 mg capsule Take 50 mg by mouth at bedtime.    . famotidine (PEPCID) 20 MG tablet Take 20 mg by mouth daily.     Marland Kitchen GLUCOSAMINE-CHONDROITIN PO Take 1 tablet by mouth 2 (two) times daily.    Marland Kitchen ibuprofen (ADVIL) 200 MG tablet Take 800 mg by mouth every 6 (six) hours as needed for headache or moderate pain.    Marland Kitchen ibuprofen (ADVIL) 600 MG tablet Take 1 tablet (600 mg total) by mouth every 6 (six) hours as needed for moderate pain. For AFTER surgery only 30 tablet 0  . insulin NPH-regular Human (70-30) 100 UNIT/ML injection  Inject 75 Units into the skin 2 (two) times daily.     Marland Kitchen KRILL OIL PO Take 1 capsule by mouth daily.    Marland Kitchen losartan (COZAAR) 100 MG tablet Take 100 mg by mouth every evening.     . Multiple Vitamins-Minerals (MULTIVITAMIN WITH MINERALS) tablet Take 1 tablet by mouth daily.    Marland Kitchen levonorgestrel (MIRENA) 20 MCG/24HR IUD 1 Intra Uterine Device (1 each total) by Intrauterine route once for 1 dose. To be placed at [facility] on 8/20 1 each 0   No current facility-administered medications for this visit.    Patient Active Problem List   Diagnosis Date Noted  . Bilateral primary osteoarthritis of knee 07/06/2020  . Morbid (severe) obesity due to excess calories ([place]) 07/06/2020  . Endometrial cancer ([place]) 08/20/2019  . Pelvic adhesive disease 08/20/2019  . SBO (small bowel obstruction) ([place]) 06/07/2016  . Lung infiltrate 06/07/2016  . Diabetes mellitus with complication ([place]) 18/29/9371  . HLD (hyperlipidemia) 06/07/2016  . Essential hypertension 06/07/2016  . Gout 06/07/2016   Past Medical History:  Diagnosis Date  . Arthritis   . Carpal tunnel syndrome    bilateral, had surgery on right hand  . Diabetes mellitus without complication ([place])   . Diverticulitis   . Endometrial cancer ([place])   . GERD (gastroesophageal reflux disease)   . Gout   . History of iron deficiency   . Hyperlipidemia   . Hypertension   . Skin cancer     Family History  Problem Relation Age of Onset  . Diabetes Sister   . Skin cancer Father     Past Surgical History:  Procedure Laterality Date  . CARPAL TUNNEL RELEASE Right   . COLON SURGERY    . COLONOSCOPY     x2  . COLOSTOMY    . COLOSTOMY REVERSAL    . DILATATION & CURETTAGE/HYSTEROSCOPY WITH MYOSURE N/A 07/17/2019   Procedure: DILATATION & CURETTAGE/HYSTEROSCOPY WITH MYOSURE;  Surgeon: Louretta Shorten, MD;  Location: [facility];  Service: Gynecology;  Laterality: N/A;  . OOPHORECTOMY     unilateral  . PILONIDAL CYST EXCISION    .  ROBOTIC ASSISTED TOTAL HYSTERECTOMY WITH BILATERAL SALPINGO OOPHERECTOMY N/A 08/20/2019   Procedure: DIAGNSOTIC LAPAROSCOPY, DILATION AND CURETTAGE, IUD PLACEMENT ;  Surgeon: Everitt Amber, MD;  Location: [facility];  Service: Gynecology;  Laterality: N/A;  . SKIN CANCER EXCISION     Social History   Occupational History  . Not on file  Tobacco Use  . Smoking status: Former Smoker    Packs/day: 3.00    Years: 22.00    Pack years: 66.00    Types: Cigarettes    Quit date: 06/08/1987    Years since quitting: 33.2  . Smokeless tobacco: Never Used  Vaping Use  . Vaping Use: Never used  Substance and Sexual Activity  .  Alcohol use: Yes    Comment: twice per week  . Drug use: No  . Sexual activity: Yes    Birth control/protection: Post-menopausal

## 2020-09-08 DIAGNOSIS — N1831 Chronic kidney disease, stage 3a: Secondary | ICD-10-CM | POA: Diagnosis not present

## 2020-09-08 DIAGNOSIS — E1122 Type 2 diabetes mellitus with diabetic chronic kidney disease: Secondary | ICD-10-CM | POA: Diagnosis not present

## 2020-09-08 DIAGNOSIS — Z794 Long term (current) use of insulin: Secondary | ICD-10-CM | POA: Diagnosis not present

## 2020-09-27 DIAGNOSIS — Z23 Encounter for immunization: Secondary | ICD-10-CM | POA: Diagnosis not present

## 2020-10-28 ENCOUNTER — Telehealth: Payer: Self-pay | Admitting: *Deleted

## 2020-10-28 NOTE — Telephone Encounter (Signed)
[name] states that the vaginal bleeding has significantly decreased. Told her that Joylene John, NP said that she needs to see a GYN to have an endometrial bx to see if cancer progressing with IUD.  Pt will call the GYN she saw 7 years ago prior to moving to [facility].  She will call back to Dr. Serita Grit office if any of her records from [place] need to be sent to the office. Pt verbalized understanding.

## 2020-10-28 NOTE — Telephone Encounter (Signed)
Patient called and stated " I see Dr Denman George but recented moved to [facility]. We wouldn't stay here forever. But I started having some bleeding this morning. It is bright red blood and I'm not soaking through pads. I have had cramping and abdomen pain for 3-4 days now. The pain is about a 3. I have no change in my bowels or bladder. I have no bloating or fullness feeling; and no nausea or vomiting." Explained that the message would be given to the nurse and someone would call her back.

## 2020-11-04 ENCOUNTER — Telehealth: Payer: Self-pay | Admitting: *Deleted

## 2020-11-04 NOTE — Telephone Encounter (Signed)
Per patient request fax records to Dr Sheilah Mins at [facility] in [place]

## 2020-11-18 DIAGNOSIS — Z6841 Body Mass Index (BMI) 40.0 and over, adult: Secondary | ICD-10-CM | POA: Diagnosis not present

## 2020-11-18 DIAGNOSIS — K660 Peritoneal adhesions (postprocedural) (postinfection): Secondary | ICD-10-CM | POA: Diagnosis not present

## 2020-11-18 DIAGNOSIS — C541 Malignant neoplasm of endometrium: Secondary | ICD-10-CM | POA: Diagnosis not present

## 2020-12-09 DIAGNOSIS — C541 Malignant neoplasm of endometrium: Secondary | ICD-10-CM | POA: Diagnosis not present

## 2020-12-12 DIAGNOSIS — Z794 Long term (current) use of insulin: Secondary | ICD-10-CM | POA: Diagnosis not present

## 2020-12-12 DIAGNOSIS — E1122 Type 2 diabetes mellitus with diabetic chronic kidney disease: Secondary | ICD-10-CM | POA: Diagnosis not present

## 2020-12-12 DIAGNOSIS — N1831 Chronic kidney disease, stage 3a: Secondary | ICD-10-CM | POA: Diagnosis not present

## 2020-12-26 NOTE — Progress Notes (Deleted)
Gynecologic Oncology Follow-up Note  Chief Complaint:  No chief complaint on file.   Assessment/Plan:  [name]  is a 70 y.o.  year old with clinical stage I grade 1 endometrioid endometrial cancer, non-operative candidate s/p D&C and IUD (progestin releasing) on 08/20/19. Complete clinical response to progestin therapy with negative repeat sampling in December, 2020.  Remains symptom free. Recommend repeat evaluation in 6 months. Repeat biopsy only if new bleeding symptoms. Recommend replace IUD in August, 2025.  HPI: [name] is a 70 year old P2 at the request of Dr Corinna Capra for grade 1 endometrioid endometrial cancer, not an operative candidate due to extreme obesity and complex prior abdominal surgical history (attempted robotic procedure aborted).   Patient reports a history of postmenopausal bleeding since July 2020.  Is been persistent low low level bleeding, spotting but no heavy passage of clots.  She reported this to her gynecologist who ordered a transvaginal ultrasound on June 24, 2019.  This revealed a uterus measuring 6.7 x 3.4 x 5.5 cm with an endometrial thickness of 1.4 cm.  The ovaries were not visualized.  She was taken to the operating room for a curettage and polypectomy on July 17, 2019 which revealed FIGO grade 1 endometrioid adenocarcinoma.  Patient has a complex medical history.  She is morbidly obese with a BMI of 48 kg/m.  She experienced ruptured diverticulitis in 1997 which required a diverting colostomy and Hartman's pouch.  She 6 weeks later she was taken to the operating room for laparotomy and reversal of Hartman's.  She reports that the surgeon described her abdomen is looking like a gallon of superglue had been poured inside her.  It was an extensively long surgery.  She is unsure if additional bowel resections were necessary.  She had a 1 month hospitalization following this reversal of colostomy.  She developed a bowel obstruction during that  hospitalization and required reoperation to resolve this.  In 2000 she developed a growth on an ovary (she cannot remember which side).  For this she underwent laparotomy and oophorectomy.  This surgery was performed in [place].  She was planning to have a hysterectomy at that procedure, however the surgeon aborted plans for hysterectomy and performed only oophorectomy after encountering significant adhesive disease.  She states that he told her surgery would have needed to the last 6 to 8 hours in order to perform hysterectomy due to the adhesive disease.  The patient has type 2 diabetes mellitus and has had this for greater than 30 years.  She reports having no endorgan disease.  Her hemoglobin A1c was 5.4% in May 2020.  Her family history is significant for a father with skin cancer.  She has 3 sisters with cancer including one with endometrial cancer who is obese, a sister with stage II 3 breast cancer, and multiple myeloma.  She has had 2 prior vaginal deliveries.  She lives in [place].  On August 20, 2019 she underwent an attempt at a robotic assisted total hysterectomy BSO and sentinel lymph node biopsy.  The surgery was aborted after laparoscopic entry into the left upper quadrant encountered dense adhesions throughout the entirety of the abdomen which included dense colonic and small bowel adhesions to the anterior abdominal wall.  The density of these adhesions and their comprehensive nature across the entirety of her abdominal wall meant that it was not possible to place additional laparoscopic ports to perform adhesio-lysis.  She would have required a laparotomy in order to achieve  hysterectomy, and the patient had declined this preoperatively, and had elected to proceed with D&C with progestin releasing IUD placement if hysterectomy was not possible via minimally invasive surgical route.  Therefore we aborted the laparoscopic procedure and performed a D&C procedure with placement of a  progestin releasing IUD.  Final pathology from the [facility] specimen showed adenocarcinoma favor endometrial primary.  It was difficult to grade the specimen due to his fragmented nature.  Interval Hx:  She reported that since placement of the IUD she has had no further vaginal bleeding.  Repeat biopsy (office) sampling in December, 2020 showed complete clinical response with no residual carcinoma.   ***She continues to be free of bleeding symptoms.  Current Meds:  Outpatient Encounter Medications as of 12/28/2020  Medication Sig  . allopurinol (ZYLOPRIM) 300 MG tablet Take 300 mg by mouth daily.  Marland Kitchen aspirin 325 MG tablet Take 325 mg by mouth daily.   Marland Kitchen atorvastatin (LIPITOR) 40 MG tablet Take 40 mg by mouth every evening.   . Carboxymethylcellul-Glycerin (LUBRICATING EYE DROPS OP) Place 1 drop into both eyes daily as needed (dry eyes).  . Cholecalciferol (VITAMIN D3 PO) Take 1 capsule by mouth every evening.   . Coenzyme Q10 (COQ10) 100 MG CAPS Take 100 mg by mouth every evening.  . diphenhydrAMINE (BENADRYL) 25 mg capsule Take 50 mg by mouth at bedtime.  . famotidine (PEPCID) 20 MG tablet Take 20 mg by mouth daily.   Marland Kitchen GLUCOSAMINE-CHONDROITIN PO Take 1 tablet by mouth 2 (two) times daily.  Marland Kitchen ibuprofen (ADVIL) 200 MG tablet Take 800 mg by mouth every 6 (six) hours as needed for headache or moderate pain.  Marland Kitchen ibuprofen (ADVIL) 600 MG tablet Take 1 tablet (600 mg total) by mouth every 6 (six) hours as needed for moderate pain. For AFTER surgery only  . insulin NPH-regular Human (70-30) 100 UNIT/ML injection Inject 75 Units into the skin 2 (two) times daily.   Marland Kitchen KRILL OIL PO Take 1 capsule by mouth daily.  Marland Kitchen levonorgestrel (MIRENA) 20 MCG/24HR IUD 1 Intra Uterine Device (1 each total) by Intrauterine route once for 1 dose. To be placed at [facility] on 8/20  . losartan (COZAAR) 100 MG tablet Take 100 mg by mouth every evening.   . Multiple Vitamins-Minerals (MULTIVITAMIN WITH MINERALS) tablet  Take 1 tablet by mouth daily.   No facility-administered encounter medications on file as of 12/28/2020.    Allergy:  Allergies  Allergen Reactions  . Actos [Pioglitazone]     Unknown reaction  . Lisinopril Cough  . Metformin And Related     Gas     Social Hx:   Social History   Socioeconomic History  . Marital status: Married    Spouse name: Not on file  . Number of children: Not on file  . Years of education: Not on file  . Highest education level: Not on file  Occupational History  . Not on file  Tobacco Use  . Smoking status: Former Smoker    Packs/day: 3.00    Years: 22.00    Pack years: 66.00    Types: Cigarettes    Quit date: 06/08/1987    Years since quitting: 33.5  . Smokeless tobacco: Never Used  Vaping Use  . Vaping Use: Never used  Substance and Sexual Activity  . Alcohol use: Yes    Comment: twice per week  . Drug use: No  . Sexual activity: Yes    Birth control/protection: Post-menopausal  Other Topics  Concern  . Not on file  Social History Narrative  . Not on file   Social Determinants of Health   Financial Resource Strain: Not on file  Food Insecurity: Not on file  Transportation Needs: Not on file  Physical Activity: Not on file  Stress: Not on file  Social Connections: Not on file  Intimate Partner Violence: Not on file    Past Surgical Hx:  Past Surgical History:  Procedure Laterality Date  . CARPAL TUNNEL RELEASE Right   . COLON SURGERY    . COLONOSCOPY     x2  . COLOSTOMY    . COLOSTOMY REVERSAL    . DILATATION & CURETTAGE/HYSTEROSCOPY WITH MYOSURE N/A 07/17/2019   Procedure: DILATATION & CURETTAGE/HYSTEROSCOPY WITH MYOSURE;  Surgeon: Lowe, David, MD;  Location: [facility];  Service: Gynecology;  Laterality: N/A;  . OOPHORECTOMY     unilateral  . PILONIDAL CYST EXCISION    . ROBOTIC ASSISTED TOTAL HYSTERECTOMY WITH BILATERAL SALPINGO OOPHERECTOMY N/A 08/20/2019   Procedure: DIAGNSOTIC LAPAROSCOPY, DILATION AND  CURETTAGE, IUD PLACEMENT ;  Surgeon: Deontaye Civello, MD;  Location: [facility];  Service: Gynecology;  Laterality: N/A;  . SKIN CANCER EXCISION      Past Medical Hx:  Past Medical History:  Diagnosis Date  . Arthritis   . Carpal tunnel syndrome    bilateral, had surgery on right hand  . Diabetes mellitus without complication ([facility])   . Diverticulitis   . Endometrial cancer ([facility])   . GERD (gastroesophageal reflux disease)   . Gout   . History of iron deficiency   . Hyperlipidemia   . Hypertension   . Skin cancer     Past Gynecological History:  See HPI.  No LMP recorded. Patient is postmenopausal.  Family Hx:  Family History  Problem Relation Age of Onset  . Diabetes Sister   . Skin cancer Father     Review of Systems:  Constitutional  Feels well,   ENT Normal appearing ears and nares bilaterally Skin/Breast  No rash, sores, jaundice, itching, dryness Cardiovascular  No chest pain, shortness of breath, or edema  Pulmonary  No cough or wheeze.  Gastro Intestinal  No nausea, vomitting, or diarrhoea. No bright red blood per rectum, no abdominal pain, change in bowel movement, or constipation.  Genito Urinary  No frequency, urgency, dysuria, no bleeding Musculo Skeletal  No myalgia, arthralgia, joint swelling or pain  Neurologic  No weakness, numbness, change in gait,  Psychology  No depression, anxiety, insomnia.   Vitals:  There were no vitals taken for this visit.  Physical Exam: WD in NAD Neck  Supple NROM, without any enlargements.  Lymph Node Survey No cervical supraclavicular or inguinal adenopathy Cardiovascular  Pulse normal rate, regularity and rhythm. S1 and S2 normal.  Lungs  Clear to auscultation bilateraly, without wheezes/crackles/rhonchi. Good air movement.  Skin  No rash/lesions/breakdown  Psychiatry  Alert and oriented to person, place, and time  Abdomen  Normoactive bowel sounds, abdomen soft, non-tender and obese without evidence of hernia.   Back No CVA tenderness Genito Urinary  Vulva/vagina: Normal external female genitalia.  No lesions. No discharge or bleeding.  Bladder/urethra:  No lesions or masses, well supported bladder  Vagina: normal, no lesions  Cervix: Normal appearing, no lesions. Straightforward to visualize with green speculum. IUD strings in place from os.   Uterus: slightly bulky, mobile, no parametrial involvement or nodularity.  Adnexa: no palpable masses. Rectal  deferred Extremities  No bilateral cyanosis, clubbing or   edema.   Thereasa Solo, MD  12/26/2020, 10:58 AM

## 2020-12-27 ENCOUNTER — Telehealth: Payer: Self-pay

## 2020-12-27 NOTE — Telephone Encounter (Signed)
TC from patient who is living in [place] and is followed by Dr. Manuela Schwartz, gyn oncologist with [facility].  Patient states her most recent biopsy shows "stage 2" and she is currently in treatment in [place].  [name] plans to relocate to [place] in the future and will contact our office when she returns.  Appointment for 12/28/2020 cancelled.

## 2020-12-28 ENCOUNTER — Ambulatory Visit: Payer: Medicare Other | Admitting: Gynecologic Oncology

## 2021-01-04 DIAGNOSIS — Z1389 Encounter for screening for other disorder: Secondary | ICD-10-CM | POA: Diagnosis not present

## 2021-01-04 DIAGNOSIS — Z789 Other specified health status: Secondary | ICD-10-CM | POA: Diagnosis not present

## 2021-01-04 DIAGNOSIS — Z9181 History of falling: Secondary | ICD-10-CM | POA: Diagnosis not present

## 2021-01-04 DIAGNOSIS — Z7289 Other problems related to lifestyle: Secondary | ICD-10-CM | POA: Diagnosis not present

## 2021-01-04 DIAGNOSIS — Z7189 Other specified counseling: Secondary | ICD-10-CM | POA: Diagnosis not present

## 2021-01-04 DIAGNOSIS — Z0000 Encounter for general adult medical examination without abnormal findings: Secondary | ICD-10-CM | POA: Diagnosis not present

## 2021-01-04 DIAGNOSIS — Z1331 Encounter for screening for depression: Secondary | ICD-10-CM | POA: Diagnosis not present

## 2021-01-04 DIAGNOSIS — Z760 Encounter for issue of repeat prescription: Secondary | ICD-10-CM | POA: Diagnosis not present

## 2021-01-04 DIAGNOSIS — R4589 Other symptoms and signs involving emotional state: Secondary | ICD-10-CM | POA: Diagnosis not present

## 2021-01-04 DIAGNOSIS — R5383 Other fatigue: Secondary | ICD-10-CM | POA: Diagnosis not present

## 2021-01-04 DIAGNOSIS — Z6841 Body Mass Index (BMI) 40.0 and over, adult: Secondary | ICD-10-CM | POA: Diagnosis not present

## 2021-01-11 DIAGNOSIS — K660 Peritoneal adhesions (postprocedural) (postinfection): Secondary | ICD-10-CM | POA: Diagnosis not present

## 2021-01-11 DIAGNOSIS — C541 Malignant neoplasm of endometrium: Secondary | ICD-10-CM | POA: Diagnosis not present

## 2021-01-11 DIAGNOSIS — E1122 Type 2 diabetes mellitus with diabetic chronic kidney disease: Secondary | ICD-10-CM | POA: Diagnosis not present

## 2021-01-11 DIAGNOSIS — R5383 Other fatigue: Secondary | ICD-10-CM | POA: Diagnosis not present

## 2021-01-11 DIAGNOSIS — Z6841 Body Mass Index (BMI) 40.0 and over, adult: Secondary | ICD-10-CM | POA: Diagnosis not present

## 2021-01-11 DIAGNOSIS — Z0000 Encounter for general adult medical examination without abnormal findings: Secondary | ICD-10-CM | POA: Diagnosis not present

## 2021-01-18 DIAGNOSIS — Z7189 Other specified counseling: Secondary | ICD-10-CM | POA: Diagnosis not present

## 2021-01-18 DIAGNOSIS — Z6841 Body Mass Index (BMI) 40.0 and over, adult: Secondary | ICD-10-CM | POA: Diagnosis not present

## 2021-01-18 DIAGNOSIS — N390 Urinary tract infection, site not specified: Secondary | ICD-10-CM | POA: Diagnosis not present

## 2021-01-18 DIAGNOSIS — Z1331 Encounter for screening for depression: Secondary | ICD-10-CM | POA: Diagnosis not present

## 2021-01-18 DIAGNOSIS — Z87891 Personal history of nicotine dependence: Secondary | ICD-10-CM | POA: Diagnosis not present

## 2021-02-02 DIAGNOSIS — Z86010 Personal history of colonic polyps: Secondary | ICD-10-CM | POA: Diagnosis not present

## 2021-02-02 DIAGNOSIS — K5730 Diverticulosis of large intestine without perforation or abscess without bleeding: Secondary | ICD-10-CM | POA: Diagnosis not present

## 2021-02-08 DIAGNOSIS — Z1231 Encounter for screening mammogram for malignant neoplasm of breast: Secondary | ICD-10-CM | POA: Diagnosis not present

## 2021-02-23 DIAGNOSIS — E669 Obesity, unspecified: Secondary | ICD-10-CM | POA: Diagnosis not present

## 2021-02-23 DIAGNOSIS — K644 Residual hemorrhoidal skin tags: Secondary | ICD-10-CM | POA: Diagnosis not present

## 2021-02-23 DIAGNOSIS — Z86010 Personal history of colonic polyps: Secondary | ICD-10-CM | POA: Diagnosis not present

## 2021-02-23 DIAGNOSIS — K5730 Diverticulosis of large intestine without perforation or abscess without bleeding: Secondary | ICD-10-CM | POA: Diagnosis not present

## 2021-02-23 DIAGNOSIS — N183 Chronic kidney disease, stage 3 unspecified: Secondary | ICD-10-CM | POA: Diagnosis not present

## 2021-02-23 DIAGNOSIS — E119 Type 2 diabetes mellitus without complications: Secondary | ICD-10-CM | POA: Diagnosis not present

## 2021-02-23 DIAGNOSIS — Z888 Allergy status to other drugs, medicaments and biological substances status: Secondary | ICD-10-CM | POA: Diagnosis not present

## 2021-02-23 DIAGNOSIS — E1122 Type 2 diabetes mellitus with diabetic chronic kidney disease: Secondary | ICD-10-CM | POA: Diagnosis not present

## 2021-02-23 DIAGNOSIS — I129 Hypertensive chronic kidney disease with stage 1 through stage 4 chronic kidney disease, or unspecified chronic kidney disease: Secondary | ICD-10-CM | POA: Diagnosis not present

## 2021-02-23 DIAGNOSIS — Z79899 Other long term (current) drug therapy: Secondary | ICD-10-CM | POA: Diagnosis not present

## 2021-02-23 DIAGNOSIS — I10 Essential (primary) hypertension: Secondary | ICD-10-CM | POA: Diagnosis not present

## 2021-02-23 DIAGNOSIS — Z1211 Encounter for screening for malignant neoplasm of colon: Secondary | ICD-10-CM | POA: Diagnosis not present

## 2021-02-23 DIAGNOSIS — E785 Hyperlipidemia, unspecified: Secondary | ICD-10-CM | POA: Diagnosis not present

## 2021-03-07 DIAGNOSIS — Z86010 Personal history of colonic polyps: Secondary | ICD-10-CM | POA: Diagnosis not present

## 2021-03-07 DIAGNOSIS — K5730 Diverticulosis of large intestine without perforation or abscess without bleeding: Secondary | ICD-10-CM | POA: Diagnosis not present

## 2021-03-16 DIAGNOSIS — C541 Malignant neoplasm of endometrium: Secondary | ICD-10-CM | POA: Diagnosis not present

## 2021-03-16 DIAGNOSIS — K660 Peritoneal adhesions (postprocedural) (postinfection): Secondary | ICD-10-CM | POA: Diagnosis not present

## 2021-03-16 DIAGNOSIS — Z6841 Body Mass Index (BMI) 40.0 and over, adult: Secondary | ICD-10-CM | POA: Diagnosis not present

## 2021-03-23 DIAGNOSIS — Z6841 Body Mass Index (BMI) 40.0 and over, adult: Secondary | ICD-10-CM | POA: Diagnosis not present

## 2021-03-23 DIAGNOSIS — K660 Peritoneal adhesions (postprocedural) (postinfection): Secondary | ICD-10-CM | POA: Diagnosis not present

## 2021-03-23 DIAGNOSIS — C541 Malignant neoplasm of endometrium: Secondary | ICD-10-CM | POA: Diagnosis not present

## 2021-04-06 DIAGNOSIS — K660 Peritoneal adhesions (postprocedural) (postinfection): Secondary | ICD-10-CM | POA: Diagnosis not present

## 2021-04-06 DIAGNOSIS — Z6841 Body Mass Index (BMI) 40.0 and over, adult: Secondary | ICD-10-CM | POA: Diagnosis not present

## 2021-04-06 DIAGNOSIS — C541 Malignant neoplasm of endometrium: Secondary | ICD-10-CM | POA: Diagnosis not present

## 2021-05-17 DIAGNOSIS — Z23 Encounter for immunization: Secondary | ICD-10-CM | POA: Diagnosis not present

## 2021-05-25 DIAGNOSIS — Z6841 Body Mass Index (BMI) 40.0 and over, adult: Secondary | ICD-10-CM | POA: Diagnosis not present

## 2021-05-25 DIAGNOSIS — M62830 Muscle spasm of back: Secondary | ICD-10-CM | POA: Diagnosis not present

## 2021-05-25 DIAGNOSIS — Z1331 Encounter for screening for depression: Secondary | ICD-10-CM | POA: Diagnosis not present

## 2021-05-25 DIAGNOSIS — Z87891 Personal history of nicotine dependence: Secondary | ICD-10-CM | POA: Diagnosis not present

## 2021-06-15 DIAGNOSIS — H2513 Age-related nuclear cataract, bilateral: Secondary | ICD-10-CM | POA: Diagnosis not present

## 2021-06-15 DIAGNOSIS — E119 Type 2 diabetes mellitus without complications: Secondary | ICD-10-CM | POA: Diagnosis not present

## 2021-06-22 DIAGNOSIS — C541 Malignant neoplasm of endometrium: Secondary | ICD-10-CM | POA: Diagnosis not present

## 2021-09-22 DIAGNOSIS — C541 Malignant neoplasm of endometrium: Secondary | ICD-10-CM | POA: Diagnosis not present

## 2021-10-06 DIAGNOSIS — C541 Malignant neoplasm of endometrium: Secondary | ICD-10-CM | POA: Diagnosis not present

## 2021-10-06 DIAGNOSIS — Z6841 Body Mass Index (BMI) 40.0 and over, adult: Secondary | ICD-10-CM | POA: Diagnosis not present

## 2021-10-06 DIAGNOSIS — I159 Secondary hypertension, unspecified: Secondary | ICD-10-CM | POA: Diagnosis not present

## 2021-10-17 DIAGNOSIS — M160 Bilateral primary osteoarthritis of hip: Secondary | ICD-10-CM | POA: Diagnosis not present

## 2021-10-17 DIAGNOSIS — M5431 Sciatica, right side: Secondary | ICD-10-CM | POA: Diagnosis not present

## 2021-10-17 DIAGNOSIS — M25551 Pain in right hip: Secondary | ICD-10-CM | POA: Diagnosis not present

## 2021-11-14 DIAGNOSIS — Z87891 Personal history of nicotine dependence: Secondary | ICD-10-CM | POA: Diagnosis not present

## 2021-11-14 DIAGNOSIS — Z1331 Encounter for screening for depression: Secondary | ICD-10-CM | POA: Diagnosis not present

## 2021-11-14 DIAGNOSIS — E1122 Type 2 diabetes mellitus with diabetic chronic kidney disease: Secondary | ICD-10-CM | POA: Diagnosis not present

## 2021-11-14 DIAGNOSIS — Z6841 Body Mass Index (BMI) 40.0 and over, adult: Secondary | ICD-10-CM | POA: Diagnosis not present

## 2021-11-14 DIAGNOSIS — N3281 Overactive bladder: Secondary | ICD-10-CM | POA: Diagnosis not present

## 2021-11-14 DIAGNOSIS — I129 Hypertensive chronic kidney disease with stage 1 through stage 4 chronic kidney disease, or unspecified chronic kidney disease: Secondary | ICD-10-CM | POA: Diagnosis not present

## 2021-11-14 DIAGNOSIS — C55 Malignant neoplasm of uterus, part unspecified: Secondary | ICD-10-CM | POA: Diagnosis not present

## 2021-11-14 DIAGNOSIS — N184 Chronic kidney disease, stage 4 (severe): Secondary | ICD-10-CM | POA: Diagnosis not present

## 2021-12-05 DIAGNOSIS — N83292 Other ovarian cyst, left side: Secondary | ICD-10-CM | POA: Diagnosis not present

## 2021-12-05 DIAGNOSIS — Z8542 Personal history of malignant neoplasm of other parts of uterus: Secondary | ICD-10-CM | POA: Diagnosis not present

## 2021-12-05 DIAGNOSIS — K5730 Diverticulosis of large intestine without perforation or abscess without bleeding: Secondary | ICD-10-CM | POA: Diagnosis not present

## 2022-01-08 DIAGNOSIS — E1169 Type 2 diabetes mellitus with other specified complication: Secondary | ICD-10-CM | POA: Diagnosis not present

## 2022-01-08 DIAGNOSIS — Z79890 Hormone replacement therapy (postmenopausal): Secondary | ICD-10-CM | POA: Diagnosis not present

## 2022-01-08 DIAGNOSIS — N1831 Chronic kidney disease, stage 3a: Secondary | ICD-10-CM | POA: Diagnosis not present

## 2022-01-08 DIAGNOSIS — Z1331 Encounter for screening for depression: Secondary | ICD-10-CM | POA: Diagnosis not present

## 2022-01-08 DIAGNOSIS — M179 Osteoarthritis of knee, unspecified: Secondary | ICD-10-CM | POA: Diagnosis not present

## 2022-01-08 DIAGNOSIS — Z23 Encounter for immunization: Secondary | ICD-10-CM | POA: Diagnosis not present

## 2022-01-08 DIAGNOSIS — E785 Hyperlipidemia, unspecified: Secondary | ICD-10-CM | POA: Diagnosis not present

## 2022-01-08 DIAGNOSIS — Z0000 Encounter for general adult medical examination without abnormal findings: Secondary | ICD-10-CM | POA: Diagnosis not present

## 2022-01-08 DIAGNOSIS — E1121 Type 2 diabetes mellitus with diabetic nephropathy: Secondary | ICD-10-CM | POA: Diagnosis not present

## 2022-01-08 DIAGNOSIS — E1165 Type 2 diabetes mellitus with hyperglycemia: Secondary | ICD-10-CM | POA: Diagnosis not present

## 2022-01-08 DIAGNOSIS — Z7189 Other specified counseling: Secondary | ICD-10-CM | POA: Diagnosis not present

## 2022-01-08 DIAGNOSIS — Z1389 Encounter for screening for other disorder: Secondary | ICD-10-CM | POA: Diagnosis not present

## 2022-01-08 DIAGNOSIS — Z6841 Body Mass Index (BMI) 40.0 and over, adult: Secondary | ICD-10-CM | POA: Diagnosis not present

## 2022-01-08 DIAGNOSIS — Z79899 Other long term (current) drug therapy: Secondary | ICD-10-CM | POA: Diagnosis not present

## 2022-01-08 DIAGNOSIS — Z87891 Personal history of nicotine dependence: Secondary | ICD-10-CM | POA: Diagnosis not present

## 2022-01-08 DIAGNOSIS — Z9181 History of falling: Secondary | ICD-10-CM | POA: Diagnosis not present

## 2022-01-08 DIAGNOSIS — I129 Hypertensive chronic kidney disease with stage 1 through stage 4 chronic kidney disease, or unspecified chronic kidney disease: Secondary | ICD-10-CM | POA: Diagnosis not present

## 2022-01-08 DIAGNOSIS — L97909 Non-pressure chronic ulcer of unspecified part of unspecified lower leg with unspecified severity: Secondary | ICD-10-CM | POA: Diagnosis not present

## 2022-01-09 DIAGNOSIS — Z9181 History of falling: Secondary | ICD-10-CM | POA: Diagnosis not present

## 2022-01-09 DIAGNOSIS — Z1389 Encounter for screening for other disorder: Secondary | ICD-10-CM | POA: Diagnosis not present

## 2022-01-09 DIAGNOSIS — I129 Hypertensive chronic kidney disease with stage 1 through stage 4 chronic kidney disease, or unspecified chronic kidney disease: Secondary | ICD-10-CM | POA: Diagnosis not present

## 2022-01-09 DIAGNOSIS — Z79899 Other long term (current) drug therapy: Secondary | ICD-10-CM | POA: Diagnosis not present

## 2022-01-09 DIAGNOSIS — Z7189 Other specified counseling: Secondary | ICD-10-CM | POA: Diagnosis not present

## 2022-01-09 DIAGNOSIS — N1831 Chronic kidney disease, stage 3a: Secondary | ICD-10-CM | POA: Diagnosis not present

## 2022-01-09 DIAGNOSIS — Z1331 Encounter for screening for depression: Secondary | ICD-10-CM | POA: Diagnosis not present

## 2022-01-09 DIAGNOSIS — E1121 Type 2 diabetes mellitus with diabetic nephropathy: Secondary | ICD-10-CM | POA: Diagnosis not present

## 2022-01-09 DIAGNOSIS — L97909 Non-pressure chronic ulcer of unspecified part of unspecified lower leg with unspecified severity: Secondary | ICD-10-CM | POA: Diagnosis not present

## 2022-01-09 DIAGNOSIS — Z23 Encounter for immunization: Secondary | ICD-10-CM | POA: Diagnosis not present

## 2022-01-09 DIAGNOSIS — E1165 Type 2 diabetes mellitus with hyperglycemia: Secondary | ICD-10-CM | POA: Diagnosis not present

## 2022-01-09 DIAGNOSIS — Z0000 Encounter for general adult medical examination without abnormal findings: Secondary | ICD-10-CM | POA: Diagnosis not present

## 2022-01-16 DIAGNOSIS — Z9181 History of falling: Secondary | ICD-10-CM | POA: Diagnosis not present

## 2022-01-16 DIAGNOSIS — E1121 Type 2 diabetes mellitus with diabetic nephropathy: Secondary | ICD-10-CM | POA: Diagnosis not present

## 2022-01-16 DIAGNOSIS — D8481 Immunodeficiency due to conditions classified elsewhere: Secondary | ICD-10-CM | POA: Diagnosis not present

## 2022-01-16 DIAGNOSIS — C55 Malignant neoplasm of uterus, part unspecified: Secondary | ICD-10-CM | POA: Diagnosis not present

## 2022-01-16 DIAGNOSIS — I739 Peripheral vascular disease, unspecified: Secondary | ICD-10-CM | POA: Diagnosis not present

## 2022-01-16 DIAGNOSIS — E1122 Type 2 diabetes mellitus with diabetic chronic kidney disease: Secondary | ICD-10-CM | POA: Diagnosis not present

## 2022-01-16 DIAGNOSIS — L03119 Cellulitis of unspecified part of limb: Secondary | ICD-10-CM | POA: Diagnosis not present

## 2022-01-16 DIAGNOSIS — R809 Proteinuria, unspecified: Secondary | ICD-10-CM | POA: Diagnosis not present

## 2022-01-16 DIAGNOSIS — Z1331 Encounter for screening for depression: Secondary | ICD-10-CM | POA: Diagnosis not present

## 2022-01-16 DIAGNOSIS — I129 Hypertensive chronic kidney disease with stage 1 through stage 4 chronic kidney disease, or unspecified chronic kidney disease: Secondary | ICD-10-CM | POA: Diagnosis not present

## 2022-01-16 DIAGNOSIS — N1832 Chronic kidney disease, stage 3b: Secondary | ICD-10-CM | POA: Diagnosis not present

## 2022-01-16 DIAGNOSIS — Z87891 Personal history of nicotine dependence: Secondary | ICD-10-CM | POA: Diagnosis not present

## 2022-01-16 DIAGNOSIS — Z6841 Body Mass Index (BMI) 40.0 and over, adult: Secondary | ICD-10-CM | POA: Diagnosis not present

## 2022-01-16 DIAGNOSIS — E1152 Type 2 diabetes mellitus with diabetic peripheral angiopathy with gangrene: Secondary | ICD-10-CM | POA: Diagnosis not present

## 2022-01-16 DIAGNOSIS — L97909 Non-pressure chronic ulcer of unspecified part of unspecified lower leg with unspecified severity: Secondary | ICD-10-CM | POA: Diagnosis not present

## 2022-01-18 DIAGNOSIS — Z888 Allergy status to other drugs, medicaments and biological substances status: Secondary | ICD-10-CM | POA: Diagnosis not present

## 2022-01-18 DIAGNOSIS — L97222 Non-pressure chronic ulcer of left calf with fat layer exposed: Secondary | ICD-10-CM | POA: Diagnosis not present

## 2022-01-18 DIAGNOSIS — L98499 Non-pressure chronic ulcer of skin of other sites with unspecified severity: Secondary | ICD-10-CM | POA: Diagnosis not present

## 2022-01-18 DIAGNOSIS — Z79899 Other long term (current) drug therapy: Secondary | ICD-10-CM | POA: Diagnosis not present

## 2022-01-18 DIAGNOSIS — I890 Lymphedema, not elsewhere classified: Secondary | ICD-10-CM | POA: Diagnosis not present

## 2022-01-18 DIAGNOSIS — E11622 Type 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: Secondary | ICD-10-CM | POA: Diagnosis not present

## 2022-01-18 DIAGNOSIS — Z8542 Personal history of malignant neoplasm of other parts of uterus: Secondary | ICD-10-CM | POA: Diagnosis not present

## 2022-01-18 DIAGNOSIS — Z794 Long term (current) use of insulin: Secondary | ICD-10-CM | POA: Diagnosis not present

## 2022-01-22 DIAGNOSIS — M25562 Pain in left knee: Secondary | ICD-10-CM | POA: Diagnosis not present

## 2022-01-22 DIAGNOSIS — M25662 Stiffness of left knee, not elsewhere classified: Secondary | ICD-10-CM | POA: Diagnosis not present

## 2022-01-22 DIAGNOSIS — R262 Difficulty in walking, not elsewhere classified: Secondary | ICD-10-CM | POA: Diagnosis not present

## 2022-01-22 DIAGNOSIS — M1712 Unilateral primary osteoarthritis, left knee: Secondary | ICD-10-CM | POA: Diagnosis not present

## 2022-01-23 DIAGNOSIS — M1711 Unilateral primary osteoarthritis, right knee: Secondary | ICD-10-CM | POA: Diagnosis not present

## 2022-01-23 DIAGNOSIS — M25561 Pain in right knee: Secondary | ICD-10-CM | POA: Diagnosis not present

## 2022-01-23 DIAGNOSIS — M25661 Stiffness of right knee, not elsewhere classified: Secondary | ICD-10-CM | POA: Diagnosis not present

## 2022-01-23 DIAGNOSIS — R262 Difficulty in walking, not elsewhere classified: Secondary | ICD-10-CM | POA: Diagnosis not present

## 2022-01-29 DIAGNOSIS — M25562 Pain in left knee: Secondary | ICD-10-CM | POA: Diagnosis not present

## 2022-01-29 DIAGNOSIS — M25662 Stiffness of left knee, not elsewhere classified: Secondary | ICD-10-CM | POA: Diagnosis not present

## 2022-01-29 DIAGNOSIS — R262 Difficulty in walking, not elsewhere classified: Secondary | ICD-10-CM | POA: Diagnosis not present

## 2022-01-29 DIAGNOSIS — M1712 Unilateral primary osteoarthritis, left knee: Secondary | ICD-10-CM | POA: Diagnosis not present

## 2022-01-30 DIAGNOSIS — E11622 Type 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: Secondary | ICD-10-CM | POA: Diagnosis not present

## 2022-01-30 DIAGNOSIS — Z794 Long term (current) use of insulin: Secondary | ICD-10-CM | POA: Diagnosis not present

## 2022-01-30 DIAGNOSIS — L97222 Non-pressure chronic ulcer of left calf with fat layer exposed: Secondary | ICD-10-CM | POA: Diagnosis not present

## 2022-01-30 DIAGNOSIS — M25661 Stiffness of right knee, not elsewhere classified: Secondary | ICD-10-CM | POA: Diagnosis not present

## 2022-01-30 DIAGNOSIS — M1711 Unilateral primary osteoarthritis, right knee: Secondary | ICD-10-CM | POA: Diagnosis not present

## 2022-01-30 DIAGNOSIS — R262 Difficulty in walking, not elsewhere classified: Secondary | ICD-10-CM | POA: Diagnosis not present

## 2022-01-30 DIAGNOSIS — I890 Lymphedema, not elsewhere classified: Secondary | ICD-10-CM | POA: Diagnosis not present

## 2022-01-30 DIAGNOSIS — L98499 Non-pressure chronic ulcer of skin of other sites with unspecified severity: Secondary | ICD-10-CM | POA: Diagnosis not present

## 2022-01-30 DIAGNOSIS — M25561 Pain in right knee: Secondary | ICD-10-CM | POA: Diagnosis not present

## 2022-01-30 DIAGNOSIS — Z79899 Other long term (current) drug therapy: Secondary | ICD-10-CM | POA: Diagnosis not present

## 2022-02-01 DIAGNOSIS — G893 Neoplasm related pain (acute) (chronic): Secondary | ICD-10-CM | POA: Diagnosis not present

## 2022-02-01 DIAGNOSIS — K660 Peritoneal adhesions (postprocedural) (postinfection): Secondary | ICD-10-CM | POA: Diagnosis not present

## 2022-02-01 DIAGNOSIS — C541 Malignant neoplasm of endometrium: Secondary | ICD-10-CM | POA: Diagnosis not present

## 2022-02-01 DIAGNOSIS — I159 Secondary hypertension, unspecified: Secondary | ICD-10-CM | POA: Diagnosis not present

## 2022-02-01 DIAGNOSIS — Z6841 Body Mass Index (BMI) 40.0 and over, adult: Secondary | ICD-10-CM | POA: Diagnosis not present

## 2022-02-05 DIAGNOSIS — M25562 Pain in left knee: Secondary | ICD-10-CM | POA: Diagnosis not present

## 2022-02-05 DIAGNOSIS — M25662 Stiffness of left knee, not elsewhere classified: Secondary | ICD-10-CM | POA: Diagnosis not present

## 2022-02-05 DIAGNOSIS — M1712 Unilateral primary osteoarthritis, left knee: Secondary | ICD-10-CM | POA: Diagnosis not present

## 2022-02-05 DIAGNOSIS — R262 Difficulty in walking, not elsewhere classified: Secondary | ICD-10-CM | POA: Diagnosis not present

## 2022-02-06 DIAGNOSIS — M25661 Stiffness of right knee, not elsewhere classified: Secondary | ICD-10-CM | POA: Diagnosis not present

## 2022-02-06 DIAGNOSIS — Z8542 Personal history of malignant neoplasm of other parts of uterus: Secondary | ICD-10-CM | POA: Diagnosis not present

## 2022-02-06 DIAGNOSIS — I890 Lymphedema, not elsewhere classified: Secondary | ICD-10-CM | POA: Diagnosis not present

## 2022-02-06 DIAGNOSIS — E119 Type 2 diabetes mellitus without complications: Secondary | ICD-10-CM | POA: Diagnosis not present

## 2022-02-06 DIAGNOSIS — L98499 Non-pressure chronic ulcer of skin of other sites with unspecified severity: Secondary | ICD-10-CM | POA: Diagnosis not present

## 2022-02-06 DIAGNOSIS — M25561 Pain in right knee: Secondary | ICD-10-CM | POA: Diagnosis not present

## 2022-02-06 DIAGNOSIS — L97222 Non-pressure chronic ulcer of left calf with fat layer exposed: Secondary | ICD-10-CM | POA: Diagnosis not present

## 2022-02-06 DIAGNOSIS — Z79899 Other long term (current) drug therapy: Secondary | ICD-10-CM | POA: Diagnosis not present

## 2022-02-06 DIAGNOSIS — R262 Difficulty in walking, not elsewhere classified: Secondary | ICD-10-CM | POA: Diagnosis not present

## 2022-02-06 DIAGNOSIS — Z888 Allergy status to other drugs, medicaments and biological substances status: Secondary | ICD-10-CM | POA: Diagnosis not present

## 2022-02-06 DIAGNOSIS — E11622 Type 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: Secondary | ICD-10-CM | POA: Diagnosis not present

## 2022-02-06 DIAGNOSIS — Z794 Long term (current) use of insulin: Secondary | ICD-10-CM | POA: Diagnosis not present

## 2022-02-06 DIAGNOSIS — M1711 Unilateral primary osteoarthritis, right knee: Secondary | ICD-10-CM | POA: Diagnosis not present

## 2022-02-12 DIAGNOSIS — K660 Peritoneal adhesions (postprocedural) (postinfection): Secondary | ICD-10-CM | POA: Diagnosis not present

## 2022-02-12 DIAGNOSIS — M25662 Stiffness of left knee, not elsewhere classified: Secondary | ICD-10-CM | POA: Diagnosis not present

## 2022-02-12 DIAGNOSIS — C541 Malignant neoplasm of endometrium: Secondary | ICD-10-CM | POA: Diagnosis not present

## 2022-02-12 DIAGNOSIS — G893 Neoplasm related pain (acute) (chronic): Secondary | ICD-10-CM | POA: Diagnosis not present

## 2022-02-12 DIAGNOSIS — Z6841 Body Mass Index (BMI) 40.0 and over, adult: Secondary | ICD-10-CM | POA: Diagnosis not present

## 2022-02-12 DIAGNOSIS — M1712 Unilateral primary osteoarthritis, left knee: Secondary | ICD-10-CM | POA: Diagnosis not present

## 2022-02-12 DIAGNOSIS — R262 Difficulty in walking, not elsewhere classified: Secondary | ICD-10-CM | POA: Diagnosis not present

## 2022-02-12 DIAGNOSIS — I159 Secondary hypertension, unspecified: Secondary | ICD-10-CM | POA: Diagnosis not present

## 2022-02-12 DIAGNOSIS — M25562 Pain in left knee: Secondary | ICD-10-CM | POA: Diagnosis not present

## 2022-02-13 DIAGNOSIS — R262 Difficulty in walking, not elsewhere classified: Secondary | ICD-10-CM | POA: Diagnosis not present

## 2022-02-13 DIAGNOSIS — M1711 Unilateral primary osteoarthritis, right knee: Secondary | ICD-10-CM | POA: Diagnosis not present

## 2022-02-13 DIAGNOSIS — M25561 Pain in right knee: Secondary | ICD-10-CM | POA: Diagnosis not present

## 2022-02-13 DIAGNOSIS — M25661 Stiffness of right knee, not elsewhere classified: Secondary | ICD-10-CM | POA: Diagnosis not present

## 2022-02-15 DIAGNOSIS — C541 Malignant neoplasm of endometrium: Secondary | ICD-10-CM | POA: Diagnosis not present

## 2022-02-15 DIAGNOSIS — K8020 Calculus of gallbladder without cholecystitis without obstruction: Secondary | ICD-10-CM | POA: Diagnosis not present

## 2022-02-19 DIAGNOSIS — R262 Difficulty in walking, not elsewhere classified: Secondary | ICD-10-CM | POA: Diagnosis not present

## 2022-02-19 DIAGNOSIS — M25562 Pain in left knee: Secondary | ICD-10-CM | POA: Diagnosis not present

## 2022-02-19 DIAGNOSIS — M25662 Stiffness of left knee, not elsewhere classified: Secondary | ICD-10-CM | POA: Diagnosis not present

## 2022-02-19 DIAGNOSIS — M1712 Unilateral primary osteoarthritis, left knee: Secondary | ICD-10-CM | POA: Diagnosis not present

## 2022-02-20 DIAGNOSIS — Z6841 Body Mass Index (BMI) 40.0 and over, adult: Secondary | ICD-10-CM | POA: Diagnosis not present

## 2022-02-20 DIAGNOSIS — C541 Malignant neoplasm of endometrium: Secondary | ICD-10-CM | POA: Diagnosis not present

## 2022-02-20 DIAGNOSIS — Z510 Encounter for antineoplastic radiation therapy: Secondary | ICD-10-CM | POA: Diagnosis not present

## 2022-02-20 DIAGNOSIS — M1711 Unilateral primary osteoarthritis, right knee: Secondary | ICD-10-CM | POA: Diagnosis not present

## 2022-02-20 DIAGNOSIS — R262 Difficulty in walking, not elsewhere classified: Secondary | ICD-10-CM | POA: Diagnosis not present

## 2022-02-20 DIAGNOSIS — M25661 Stiffness of right knee, not elsewhere classified: Secondary | ICD-10-CM | POA: Diagnosis not present

## 2022-02-20 DIAGNOSIS — K660 Peritoneal adhesions (postprocedural) (postinfection): Secondary | ICD-10-CM | POA: Diagnosis not present

## 2022-02-20 DIAGNOSIS — M25561 Pain in right knee: Secondary | ICD-10-CM | POA: Diagnosis not present

## 2022-02-22 DIAGNOSIS — C541 Malignant neoplasm of endometrium: Secondary | ICD-10-CM | POA: Diagnosis not present

## 2022-02-22 DIAGNOSIS — K660 Peritoneal adhesions (postprocedural) (postinfection): Secondary | ICD-10-CM | POA: Diagnosis not present

## 2022-02-22 DIAGNOSIS — G893 Neoplasm related pain (acute) (chronic): Secondary | ICD-10-CM | POA: Diagnosis not present

## 2022-02-22 DIAGNOSIS — Z6841 Body Mass Index (BMI) 40.0 and over, adult: Secondary | ICD-10-CM | POA: Diagnosis not present

## 2022-02-23 DIAGNOSIS — Z510 Encounter for antineoplastic radiation therapy: Secondary | ICD-10-CM | POA: Diagnosis not present

## 2022-02-23 DIAGNOSIS — C541 Malignant neoplasm of endometrium: Secondary | ICD-10-CM | POA: Diagnosis not present

## 2022-02-23 DIAGNOSIS — Z789 Other specified health status: Secondary | ICD-10-CM | POA: Diagnosis not present

## 2022-02-27 DIAGNOSIS — Z789 Other specified health status: Secondary | ICD-10-CM | POA: Diagnosis not present

## 2022-02-27 DIAGNOSIS — C541 Malignant neoplasm of endometrium: Secondary | ICD-10-CM | POA: Diagnosis not present

## 2022-02-27 DIAGNOSIS — Z510 Encounter for antineoplastic radiation therapy: Secondary | ICD-10-CM | POA: Diagnosis not present

## 2022-03-02 DIAGNOSIS — C541 Malignant neoplasm of endometrium: Secondary | ICD-10-CM | POA: Diagnosis not present

## 2022-03-02 DIAGNOSIS — Z789 Other specified health status: Secondary | ICD-10-CM | POA: Diagnosis not present

## 2022-03-02 DIAGNOSIS — Z510 Encounter for antineoplastic radiation therapy: Secondary | ICD-10-CM | POA: Diagnosis not present

## 2022-03-06 DIAGNOSIS — R4589 Other symptoms and signs involving emotional state: Secondary | ICD-10-CM | POA: Diagnosis not present

## 2022-03-06 DIAGNOSIS — I159 Secondary hypertension, unspecified: Secondary | ICD-10-CM | POA: Diagnosis not present

## 2022-03-06 DIAGNOSIS — G893 Neoplasm related pain (acute) (chronic): Secondary | ICD-10-CM | POA: Diagnosis not present

## 2022-03-06 DIAGNOSIS — Z7189 Other specified counseling: Secondary | ICD-10-CM | POA: Diagnosis not present

## 2022-03-06 DIAGNOSIS — Z01818 Encounter for other preprocedural examination: Secondary | ICD-10-CM | POA: Diagnosis not present

## 2022-03-06 DIAGNOSIS — R0600 Dyspnea, unspecified: Secondary | ICD-10-CM | POA: Diagnosis not present

## 2022-03-06 DIAGNOSIS — Z1331 Encounter for screening for depression: Secondary | ICD-10-CM | POA: Diagnosis not present

## 2022-03-06 DIAGNOSIS — Z6841 Body Mass Index (BMI) 40.0 and over, adult: Secondary | ICD-10-CM | POA: Diagnosis not present

## 2022-03-06 DIAGNOSIS — R9431 Abnormal electrocardiogram [ECG] [EKG]: Secondary | ICD-10-CM | POA: Diagnosis not present

## 2022-03-06 DIAGNOSIS — C55 Malignant neoplasm of uterus, part unspecified: Secondary | ICD-10-CM | POA: Diagnosis not present

## 2022-03-06 DIAGNOSIS — Z510 Encounter for antineoplastic radiation therapy: Secondary | ICD-10-CM | POA: Diagnosis not present

## 2022-03-06 DIAGNOSIS — Z789 Other specified health status: Secondary | ICD-10-CM | POA: Diagnosis not present

## 2022-03-06 DIAGNOSIS — Z87891 Personal history of nicotine dependence: Secondary | ICD-10-CM | POA: Diagnosis not present

## 2022-03-06 DIAGNOSIS — K660 Peritoneal adhesions (postprocedural) (postinfection): Secondary | ICD-10-CM | POA: Diagnosis not present

## 2022-03-06 DIAGNOSIS — C541 Malignant neoplasm of endometrium: Secondary | ICD-10-CM | POA: Diagnosis not present

## 2022-03-07 DIAGNOSIS — C541 Malignant neoplasm of endometrium: Secondary | ICD-10-CM | POA: Diagnosis not present

## 2022-03-07 DIAGNOSIS — Z510 Encounter for antineoplastic radiation therapy: Secondary | ICD-10-CM | POA: Diagnosis not present

## 2022-03-07 DIAGNOSIS — Z789 Other specified health status: Secondary | ICD-10-CM | POA: Diagnosis not present

## 2022-03-08 DIAGNOSIS — C541 Malignant neoplasm of endometrium: Secondary | ICD-10-CM | POA: Diagnosis not present

## 2022-03-08 DIAGNOSIS — Z510 Encounter for antineoplastic radiation therapy: Secondary | ICD-10-CM | POA: Diagnosis not present

## 2022-03-08 DIAGNOSIS — Z789 Other specified health status: Secondary | ICD-10-CM | POA: Diagnosis not present

## 2022-03-09 DIAGNOSIS — Z789 Other specified health status: Secondary | ICD-10-CM | POA: Diagnosis not present

## 2022-03-09 DIAGNOSIS — Z510 Encounter for antineoplastic radiation therapy: Secondary | ICD-10-CM | POA: Diagnosis not present

## 2022-03-09 DIAGNOSIS — C541 Malignant neoplasm of endometrium: Secondary | ICD-10-CM | POA: Diagnosis not present

## 2022-03-13 DIAGNOSIS — C541 Malignant neoplasm of endometrium: Secondary | ICD-10-CM | POA: Diagnosis not present

## 2022-03-13 DIAGNOSIS — Z510 Encounter for antineoplastic radiation therapy: Secondary | ICD-10-CM | POA: Diagnosis not present

## 2022-03-13 DIAGNOSIS — Z789 Other specified health status: Secondary | ICD-10-CM | POA: Diagnosis not present

## 2022-03-14 DIAGNOSIS — Z510 Encounter for antineoplastic radiation therapy: Secondary | ICD-10-CM | POA: Diagnosis not present

## 2022-03-14 DIAGNOSIS — C541 Malignant neoplasm of endometrium: Secondary | ICD-10-CM | POA: Diagnosis not present

## 2022-03-14 DIAGNOSIS — Z789 Other specified health status: Secondary | ICD-10-CM | POA: Diagnosis not present

## 2022-03-15 DIAGNOSIS — Z510 Encounter for antineoplastic radiation therapy: Secondary | ICD-10-CM | POA: Diagnosis not present

## 2022-03-15 DIAGNOSIS — C541 Malignant neoplasm of endometrium: Secondary | ICD-10-CM | POA: Diagnosis not present

## 2022-03-15 DIAGNOSIS — E1122 Type 2 diabetes mellitus with diabetic chronic kidney disease: Secondary | ICD-10-CM | POA: Diagnosis not present

## 2022-03-15 DIAGNOSIS — Z789 Other specified health status: Secondary | ICD-10-CM | POA: Diagnosis not present

## 2022-03-15 DIAGNOSIS — N1831 Chronic kidney disease, stage 3a: Secondary | ICD-10-CM | POA: Diagnosis not present

## 2022-03-15 DIAGNOSIS — Z794 Long term (current) use of insulin: Secondary | ICD-10-CM | POA: Diagnosis not present

## 2022-03-15 DIAGNOSIS — R0600 Dyspnea, unspecified: Secondary | ICD-10-CM | POA: Diagnosis not present

## 2022-03-15 DIAGNOSIS — Z87891 Personal history of nicotine dependence: Secondary | ICD-10-CM | POA: Diagnosis not present

## 2022-03-15 DIAGNOSIS — I129 Hypertensive chronic kidney disease with stage 1 through stage 4 chronic kidney disease, or unspecified chronic kidney disease: Secondary | ICD-10-CM | POA: Diagnosis not present

## 2022-03-16 DIAGNOSIS — Z510 Encounter for antineoplastic radiation therapy: Secondary | ICD-10-CM | POA: Diagnosis not present

## 2022-03-16 DIAGNOSIS — Z789 Other specified health status: Secondary | ICD-10-CM | POA: Diagnosis not present

## 2022-03-16 DIAGNOSIS — C541 Malignant neoplasm of endometrium: Secondary | ICD-10-CM | POA: Diagnosis not present

## 2022-03-19 DIAGNOSIS — R9431 Abnormal electrocardiogram [ECG] [EKG]: Secondary | ICD-10-CM | POA: Diagnosis not present

## 2022-03-19 DIAGNOSIS — R0602 Shortness of breath: Secondary | ICD-10-CM | POA: Diagnosis not present

## 2022-03-20 DIAGNOSIS — R0602 Shortness of breath: Secondary | ICD-10-CM | POA: Diagnosis not present

## 2022-03-20 DIAGNOSIS — R9431 Abnormal electrocardiogram [ECG] [EKG]: Secondary | ICD-10-CM | POA: Diagnosis not present

## 2022-03-21 DIAGNOSIS — Z789 Other specified health status: Secondary | ICD-10-CM | POA: Diagnosis not present

## 2022-03-21 DIAGNOSIS — C541 Malignant neoplasm of endometrium: Secondary | ICD-10-CM | POA: Diagnosis not present

## 2022-03-21 DIAGNOSIS — Z510 Encounter for antineoplastic radiation therapy: Secondary | ICD-10-CM | POA: Diagnosis not present

## 2022-03-22 DIAGNOSIS — Z510 Encounter for antineoplastic radiation therapy: Secondary | ICD-10-CM | POA: Diagnosis not present

## 2022-03-22 DIAGNOSIS — Z789 Other specified health status: Secondary | ICD-10-CM | POA: Diagnosis not present

## 2022-03-22 DIAGNOSIS — C541 Malignant neoplasm of endometrium: Secondary | ICD-10-CM | POA: Diagnosis not present

## 2022-03-26 DIAGNOSIS — C541 Malignant neoplasm of endometrium: Secondary | ICD-10-CM | POA: Diagnosis not present

## 2022-03-26 DIAGNOSIS — Z510 Encounter for antineoplastic radiation therapy: Secondary | ICD-10-CM | POA: Diagnosis not present

## 2022-03-26 DIAGNOSIS — Z789 Other specified health status: Secondary | ICD-10-CM | POA: Diagnosis not present

## 2022-03-27 DIAGNOSIS — Z87891 Personal history of nicotine dependence: Secondary | ICD-10-CM | POA: Diagnosis not present

## 2022-03-27 DIAGNOSIS — E7800 Pure hypercholesterolemia, unspecified: Secondary | ICD-10-CM | POA: Diagnosis not present

## 2022-03-27 DIAGNOSIS — Z923 Personal history of irradiation: Secondary | ICD-10-CM | POA: Diagnosis not present

## 2022-03-27 DIAGNOSIS — E1122 Type 2 diabetes mellitus with diabetic chronic kidney disease: Secondary | ICD-10-CM | POA: Diagnosis not present

## 2022-03-27 DIAGNOSIS — Z794 Long term (current) use of insulin: Secondary | ICD-10-CM | POA: Diagnosis not present

## 2022-03-27 DIAGNOSIS — Z789 Other specified health status: Secondary | ICD-10-CM | POA: Diagnosis not present

## 2022-03-27 DIAGNOSIS — C541 Malignant neoplasm of endometrium: Secondary | ICD-10-CM | POA: Diagnosis not present

## 2022-03-27 DIAGNOSIS — Z6841 Body Mass Index (BMI) 40.0 and over, adult: Secondary | ICD-10-CM | POA: Diagnosis not present

## 2022-03-27 DIAGNOSIS — Z803 Family history of malignant neoplasm of breast: Secondary | ICD-10-CM | POA: Diagnosis not present

## 2022-03-27 DIAGNOSIS — Z808 Family history of malignant neoplasm of other organs or systems: Secondary | ICD-10-CM | POA: Diagnosis not present

## 2022-03-27 DIAGNOSIS — Z30432 Encounter for removal of intrauterine contraceptive device: Secondary | ICD-10-CM | POA: Diagnosis not present

## 2022-03-27 DIAGNOSIS — Z510 Encounter for antineoplastic radiation therapy: Secondary | ICD-10-CM | POA: Diagnosis not present

## 2022-03-27 DIAGNOSIS — I498 Other specified cardiac arrhythmias: Secondary | ICD-10-CM | POA: Diagnosis not present

## 2022-03-27 DIAGNOSIS — I129 Hypertensive chronic kidney disease with stage 1 through stage 4 chronic kidney disease, or unspecified chronic kidney disease: Secondary | ICD-10-CM | POA: Diagnosis not present

## 2022-03-27 DIAGNOSIS — N183 Chronic kidney disease, stage 3 unspecified: Secondary | ICD-10-CM | POA: Diagnosis not present

## 2022-03-28 DIAGNOSIS — Z789 Other specified health status: Secondary | ICD-10-CM | POA: Diagnosis not present

## 2022-03-28 DIAGNOSIS — Z510 Encounter for antineoplastic radiation therapy: Secondary | ICD-10-CM | POA: Diagnosis not present

## 2022-03-28 DIAGNOSIS — C541 Malignant neoplasm of endometrium: Secondary | ICD-10-CM | POA: Diagnosis not present

## 2022-03-29 DIAGNOSIS — Z510 Encounter for antineoplastic radiation therapy: Secondary | ICD-10-CM | POA: Diagnosis not present

## 2022-03-29 DIAGNOSIS — Z789 Other specified health status: Secondary | ICD-10-CM | POA: Diagnosis not present

## 2022-03-29 DIAGNOSIS — C541 Malignant neoplasm of endometrium: Secondary | ICD-10-CM | POA: Diagnosis not present

## 2022-03-30 DIAGNOSIS — Z510 Encounter for antineoplastic radiation therapy: Secondary | ICD-10-CM | POA: Diagnosis not present

## 2022-03-30 DIAGNOSIS — Z789 Other specified health status: Secondary | ICD-10-CM | POA: Diagnosis not present

## 2022-03-30 DIAGNOSIS — C541 Malignant neoplasm of endometrium: Secondary | ICD-10-CM | POA: Diagnosis not present

## 2022-04-02 DIAGNOSIS — Z789 Other specified health status: Secondary | ICD-10-CM | POA: Diagnosis not present

## 2022-04-02 DIAGNOSIS — C541 Malignant neoplasm of endometrium: Secondary | ICD-10-CM | POA: Diagnosis not present

## 2022-04-02 DIAGNOSIS — Z510 Encounter for antineoplastic radiation therapy: Secondary | ICD-10-CM | POA: Diagnosis not present

## 2022-04-03 DIAGNOSIS — Z87891 Personal history of nicotine dependence: Secondary | ICD-10-CM | POA: Diagnosis not present

## 2022-04-03 DIAGNOSIS — Z510 Encounter for antineoplastic radiation therapy: Secondary | ICD-10-CM | POA: Diagnosis not present

## 2022-04-03 DIAGNOSIS — Z6841 Body Mass Index (BMI) 40.0 and over, adult: Secondary | ICD-10-CM | POA: Diagnosis not present

## 2022-04-03 DIAGNOSIS — M109 Gout, unspecified: Secondary | ICD-10-CM | POA: Diagnosis not present

## 2022-04-03 DIAGNOSIS — E7800 Pure hypercholesterolemia, unspecified: Secondary | ICD-10-CM | POA: Diagnosis not present

## 2022-04-03 DIAGNOSIS — R279 Unspecified lack of coordination: Secondary | ICD-10-CM | POA: Diagnosis not present

## 2022-04-03 DIAGNOSIS — E1122 Type 2 diabetes mellitus with diabetic chronic kidney disease: Secondary | ICD-10-CM | POA: Diagnosis not present

## 2022-04-03 DIAGNOSIS — K219 Gastro-esophageal reflux disease without esophagitis: Secondary | ICD-10-CM | POA: Diagnosis not present

## 2022-04-03 DIAGNOSIS — Z789 Other specified health status: Secondary | ICD-10-CM | POA: Diagnosis not present

## 2022-04-03 DIAGNOSIS — C539 Malignant neoplasm of cervix uteri, unspecified: Secondary | ICD-10-CM | POA: Diagnosis not present

## 2022-04-03 DIAGNOSIS — C541 Malignant neoplasm of endometrium: Secondary | ICD-10-CM | POA: Diagnosis not present

## 2022-04-03 DIAGNOSIS — Z794 Long term (current) use of insulin: Secondary | ICD-10-CM | POA: Diagnosis not present

## 2022-04-03 DIAGNOSIS — I129 Hypertensive chronic kidney disease with stage 1 through stage 4 chronic kidney disease, or unspecified chronic kidney disease: Secondary | ICD-10-CM | POA: Diagnosis not present

## 2022-04-03 DIAGNOSIS — N183 Chronic kidney disease, stage 3 unspecified: Secondary | ICD-10-CM | POA: Diagnosis not present

## 2022-04-04 DIAGNOSIS — C541 Malignant neoplasm of endometrium: Secondary | ICD-10-CM | POA: Diagnosis not present

## 2022-04-04 DIAGNOSIS — Z510 Encounter for antineoplastic radiation therapy: Secondary | ICD-10-CM | POA: Diagnosis not present

## 2022-04-04 DIAGNOSIS — Z789 Other specified health status: Secondary | ICD-10-CM | POA: Diagnosis not present

## 2022-04-05 DIAGNOSIS — Z789 Other specified health status: Secondary | ICD-10-CM | POA: Diagnosis not present

## 2022-04-05 DIAGNOSIS — C541 Malignant neoplasm of endometrium: Secondary | ICD-10-CM | POA: Diagnosis not present

## 2022-04-05 DIAGNOSIS — Z510 Encounter for antineoplastic radiation therapy: Secondary | ICD-10-CM | POA: Diagnosis not present

## 2022-04-06 DIAGNOSIS — Z789 Other specified health status: Secondary | ICD-10-CM | POA: Diagnosis not present

## 2022-04-06 DIAGNOSIS — C541 Malignant neoplasm of endometrium: Secondary | ICD-10-CM | POA: Diagnosis not present

## 2022-04-06 DIAGNOSIS — Z510 Encounter for antineoplastic radiation therapy: Secondary | ICD-10-CM | POA: Diagnosis not present

## 2022-04-09 DIAGNOSIS — Z789 Other specified health status: Secondary | ICD-10-CM | POA: Diagnosis not present

## 2022-04-09 DIAGNOSIS — Z510 Encounter for antineoplastic radiation therapy: Secondary | ICD-10-CM | POA: Diagnosis not present

## 2022-04-09 DIAGNOSIS — C541 Malignant neoplasm of endometrium: Secondary | ICD-10-CM | POA: Diagnosis not present

## 2022-04-10 DIAGNOSIS — E1122 Type 2 diabetes mellitus with diabetic chronic kidney disease: Secondary | ICD-10-CM | POA: Diagnosis not present

## 2022-04-10 DIAGNOSIS — J439 Emphysema, unspecified: Secondary | ICD-10-CM | POA: Diagnosis not present

## 2022-04-10 DIAGNOSIS — Z789 Other specified health status: Secondary | ICD-10-CM | POA: Diagnosis not present

## 2022-04-10 DIAGNOSIS — Z794 Long term (current) use of insulin: Secondary | ICD-10-CM | POA: Diagnosis not present

## 2022-04-10 DIAGNOSIS — Z6841 Body Mass Index (BMI) 40.0 and over, adult: Secondary | ICD-10-CM | POA: Diagnosis not present

## 2022-04-10 DIAGNOSIS — N183 Chronic kidney disease, stage 3 unspecified: Secondary | ICD-10-CM | POA: Diagnosis not present

## 2022-04-10 DIAGNOSIS — E1165 Type 2 diabetes mellitus with hyperglycemia: Secondary | ICD-10-CM | POA: Diagnosis not present

## 2022-04-10 DIAGNOSIS — I129 Hypertensive chronic kidney disease with stage 1 through stage 4 chronic kidney disease, or unspecified chronic kidney disease: Secondary | ICD-10-CM | POA: Diagnosis not present

## 2022-04-10 DIAGNOSIS — E785 Hyperlipidemia, unspecified: Secondary | ICD-10-CM | POA: Diagnosis not present

## 2022-04-10 DIAGNOSIS — Z7985 Long-term (current) use of injectable non-insulin antidiabetic drugs: Secondary | ICD-10-CM | POA: Diagnosis not present

## 2022-04-10 DIAGNOSIS — I10 Essential (primary) hypertension: Secondary | ICD-10-CM | POA: Diagnosis not present

## 2022-04-10 DIAGNOSIS — C541 Malignant neoplasm of endometrium: Secondary | ICD-10-CM | POA: Diagnosis not present

## 2022-04-10 DIAGNOSIS — Z87891 Personal history of nicotine dependence: Secondary | ICD-10-CM | POA: Diagnosis not present

## 2022-04-10 DIAGNOSIS — Z510 Encounter for antineoplastic radiation therapy: Secondary | ICD-10-CM | POA: Diagnosis not present

## 2022-04-11 DIAGNOSIS — C541 Malignant neoplasm of endometrium: Secondary | ICD-10-CM | POA: Diagnosis not present

## 2022-04-11 DIAGNOSIS — Z510 Encounter for antineoplastic radiation therapy: Secondary | ICD-10-CM | POA: Diagnosis not present

## 2022-04-11 DIAGNOSIS — Z789 Other specified health status: Secondary | ICD-10-CM | POA: Diagnosis not present

## 2022-04-12 DIAGNOSIS — Z510 Encounter for antineoplastic radiation therapy: Secondary | ICD-10-CM | POA: Diagnosis not present

## 2022-04-12 DIAGNOSIS — Z789 Other specified health status: Secondary | ICD-10-CM | POA: Diagnosis not present

## 2022-04-12 DIAGNOSIS — C541 Malignant neoplasm of endometrium: Secondary | ICD-10-CM | POA: Diagnosis not present

## 2022-04-13 DIAGNOSIS — C541 Malignant neoplasm of endometrium: Secondary | ICD-10-CM | POA: Diagnosis not present

## 2022-04-13 DIAGNOSIS — Z510 Encounter for antineoplastic radiation therapy: Secondary | ICD-10-CM | POA: Diagnosis not present

## 2022-04-13 DIAGNOSIS — Z789 Other specified health status: Secondary | ICD-10-CM | POA: Diagnosis not present

## 2022-04-16 DIAGNOSIS — C541 Malignant neoplasm of endometrium: Secondary | ICD-10-CM | POA: Diagnosis not present

## 2022-04-16 DIAGNOSIS — Z789 Other specified health status: Secondary | ICD-10-CM | POA: Diagnosis not present

## 2022-04-16 DIAGNOSIS — Z510 Encounter for antineoplastic radiation therapy: Secondary | ICD-10-CM | POA: Diagnosis not present

## 2022-04-17 DIAGNOSIS — Z789 Other specified health status: Secondary | ICD-10-CM | POA: Diagnosis not present

## 2022-04-17 DIAGNOSIS — C541 Malignant neoplasm of endometrium: Secondary | ICD-10-CM | POA: Diagnosis not present

## 2022-04-17 DIAGNOSIS — Z510 Encounter for antineoplastic radiation therapy: Secondary | ICD-10-CM | POA: Diagnosis not present

## 2022-04-18 DIAGNOSIS — C541 Malignant neoplasm of endometrium: Secondary | ICD-10-CM | POA: Diagnosis not present

## 2022-04-18 DIAGNOSIS — Z789 Other specified health status: Secondary | ICD-10-CM | POA: Diagnosis not present

## 2022-04-18 DIAGNOSIS — Z510 Encounter for antineoplastic radiation therapy: Secondary | ICD-10-CM | POA: Diagnosis not present

## 2022-04-24 DIAGNOSIS — C541 Malignant neoplasm of endometrium: Secondary | ICD-10-CM | POA: Diagnosis not present

## 2022-04-24 DIAGNOSIS — Z510 Encounter for antineoplastic radiation therapy: Secondary | ICD-10-CM | POA: Diagnosis not present

## 2022-04-24 DIAGNOSIS — I129 Hypertensive chronic kidney disease with stage 1 through stage 4 chronic kidney disease, or unspecified chronic kidney disease: Secondary | ICD-10-CM | POA: Diagnosis not present

## 2022-04-24 DIAGNOSIS — N183 Chronic kidney disease, stage 3 unspecified: Secondary | ICD-10-CM | POA: Diagnosis not present

## 2022-04-24 DIAGNOSIS — E1122 Type 2 diabetes mellitus with diabetic chronic kidney disease: Secondary | ICD-10-CM | POA: Diagnosis not present

## 2022-04-24 DIAGNOSIS — E7800 Pure hypercholesterolemia, unspecified: Secondary | ICD-10-CM | POA: Diagnosis not present

## 2022-04-24 DIAGNOSIS — Z789 Other specified health status: Secondary | ICD-10-CM | POA: Diagnosis not present

## 2022-04-24 DIAGNOSIS — Z6841 Body Mass Index (BMI) 40.0 and over, adult: Secondary | ICD-10-CM | POA: Diagnosis not present

## 2022-04-24 DIAGNOSIS — G8929 Other chronic pain: Secondary | ICD-10-CM | POA: Diagnosis not present

## 2022-05-01 DIAGNOSIS — Z789 Other specified health status: Secondary | ICD-10-CM | POA: Diagnosis not present

## 2022-05-01 DIAGNOSIS — Z6841 Body Mass Index (BMI) 40.0 and over, adult: Secondary | ICD-10-CM | POA: Diagnosis not present

## 2022-05-01 DIAGNOSIS — R279 Unspecified lack of coordination: Secondary | ICD-10-CM | POA: Diagnosis not present

## 2022-05-01 DIAGNOSIS — C541 Malignant neoplasm of endometrium: Secondary | ICD-10-CM | POA: Diagnosis not present

## 2022-05-01 DIAGNOSIS — Z791 Long term (current) use of non-steroidal anti-inflammatories (NSAID): Secondary | ICD-10-CM | POA: Diagnosis not present

## 2022-05-01 DIAGNOSIS — Z510 Encounter for antineoplastic radiation therapy: Secondary | ICD-10-CM | POA: Diagnosis not present

## 2022-05-01 DIAGNOSIS — N183 Chronic kidney disease, stage 3 unspecified: Secondary | ICD-10-CM | POA: Diagnosis not present

## 2022-05-01 DIAGNOSIS — E669 Obesity, unspecified: Secondary | ICD-10-CM | POA: Diagnosis not present

## 2022-05-01 DIAGNOSIS — C539 Malignant neoplasm of cervix uteri, unspecified: Secondary | ICD-10-CM | POA: Diagnosis not present

## 2022-05-01 DIAGNOSIS — E7800 Pure hypercholesterolemia, unspecified: Secondary | ICD-10-CM | POA: Diagnosis not present

## 2022-05-01 DIAGNOSIS — M109 Gout, unspecified: Secondary | ICD-10-CM | POA: Diagnosis not present

## 2022-05-01 DIAGNOSIS — I129 Hypertensive chronic kidney disease with stage 1 through stage 4 chronic kidney disease, or unspecified chronic kidney disease: Secondary | ICD-10-CM | POA: Diagnosis not present

## 2022-05-01 DIAGNOSIS — E1122 Type 2 diabetes mellitus with diabetic chronic kidney disease: Secondary | ICD-10-CM | POA: Diagnosis not present

## 2022-05-14 DIAGNOSIS — R9431 Abnormal electrocardiogram [ECG] [EKG]: Secondary | ICD-10-CM | POA: Diagnosis not present

## 2022-05-14 DIAGNOSIS — I10 Essential (primary) hypertension: Secondary | ICD-10-CM | POA: Diagnosis not present

## 2022-05-14 DIAGNOSIS — R0602 Shortness of breath: Secondary | ICD-10-CM | POA: Diagnosis not present

## 2022-06-12 DIAGNOSIS — N83292 Other ovarian cyst, left side: Secondary | ICD-10-CM | POA: Diagnosis not present

## 2022-06-12 DIAGNOSIS — C541 Malignant neoplasm of endometrium: Secondary | ICD-10-CM | POA: Diagnosis not present

## 2022-06-14 DIAGNOSIS — C541 Malignant neoplasm of endometrium: Secondary | ICD-10-CM | POA: Diagnosis not present

## 2022-06-14 DIAGNOSIS — N83292 Other ovarian cyst, left side: Secondary | ICD-10-CM | POA: Diagnosis not present

## 2022-06-18 DIAGNOSIS — I159 Secondary hypertension, unspecified: Secondary | ICD-10-CM | POA: Diagnosis not present

## 2022-06-18 DIAGNOSIS — K660 Peritoneal adhesions (postprocedural) (postinfection): Secondary | ICD-10-CM | POA: Diagnosis not present

## 2022-06-18 DIAGNOSIS — G893 Neoplasm related pain (acute) (chronic): Secondary | ICD-10-CM | POA: Diagnosis not present

## 2022-06-18 DIAGNOSIS — Z6841 Body Mass Index (BMI) 40.0 and over, adult: Secondary | ICD-10-CM | POA: Diagnosis not present

## 2022-06-18 DIAGNOSIS — C541 Malignant neoplasm of endometrium: Secondary | ICD-10-CM | POA: Diagnosis not present

## 2022-07-07 ENCOUNTER — Encounter: Payer: Self-pay | Admitting: Emergency Medicine

## 2022-07-07 ENCOUNTER — Ambulatory Visit
Admission: EM | Admit: 2022-07-07 | Discharge: 2022-07-07 | Disposition: A | Discharge status: Home / Self Care | Payer: Medicare Other | Attending: Urgent Care | Admitting: Urgent Care

## 2022-07-07 DIAGNOSIS — L03115 Cellulitis of right lower limb: Secondary | ICD-10-CM | POA: Diagnosis not present

## 2022-07-07 DIAGNOSIS — H8110 Benign paroxysmal vertigo, unspecified ear: Secondary | ICD-10-CM | POA: Diagnosis not present

## 2022-07-07 DIAGNOSIS — E119 Type 2 diabetes mellitus without complications: Secondary | ICD-10-CM | POA: Diagnosis not present

## 2022-07-07 DIAGNOSIS — Z794 Long term (current) use of insulin: Secondary | ICD-10-CM | POA: Diagnosis not present

## 2022-07-07 DIAGNOSIS — L03116 Cellulitis of left lower limb: Secondary | ICD-10-CM | POA: Diagnosis not present

## 2022-07-07 MED ORDER — BACITRACIN ZINC 500 UNIT/GM EX OINT: 1.0000 | TOPICAL_OINTMENT | Freq: Two times a day (BID) | CUTANEOUS | 0 refills | Status: DC

## 2022-07-07 MED ORDER — MECLIZINE HCL 12.5 MG PO TABS: 12.5000 mg | ORAL_TABLET | Freq: Three times a day (TID) | ORAL | 0 refills | Status: AC | PRN

## 2022-07-07 MED ORDER — DOXYCYCLINE HYCLATE 100 MG PO CAPS: 100.0000 mg | ORAL_CAPSULE | Freq: Two times a day (BID) | ORAL | 0 refills | Status: DC

## 2022-07-07 NOTE — ED Provider Notes (Signed)
[place]   MRN: [id number] DOB: 1950/11/26  Subjective:   [name] is a 72 y.o. female presenting for several week history of acute onset persistent worsening lower leg pain, redness, drainage and weeping.  Patient has not had this problem before.  She does endorse that her skin is regularly discolored of the lower legs.  Has previously had to have wound care when she lived in [name].  She has been in the process of building back to [place].  She has not yet established care with a primary care provider.  No current facility-administered medications for this encounter.  Current Outpatient Medications:    allopurinol (ZYLOPRIM) 300 MG tablet, Take 300 mg by mouth daily., Disp: , Rfl:    aspirin 325 MG tablet, Take 325 mg by mouth daily. , Disp: , Rfl:    atorvastatin (LIPITOR) 40 MG tablet, Take 40 mg by mouth every evening. , Disp: , Rfl:    Carboxymethylcellul-Glycerin (LUBRICATING EYE DROPS OP), Place 1 drop into both eyes daily as needed (dry eyes)., Disp: , Rfl:    Cholecalciferol (VITAMIN D3 PO), Take 1 capsule by mouth every evening. , Disp: , Rfl:    Coenzyme Q10 (COQ10) 100 MG CAPS, Take 100 mg by mouth every evening., Disp: , Rfl:    diphenhydrAMINE (BENADRYL) 25 mg capsule, Take 50 mg by mouth at bedtime., Disp: , Rfl:    famotidine (PEPCID) 20 MG tablet, Take 20 mg by mouth daily. , Disp: , Rfl:    GLUCOSAMINE-CHONDROITIN PO, Take 1 tablet by mouth 2 (two) times daily., Disp: , Rfl:    ibuprofen (ADVIL) 200 MG tablet, Take 800 mg by mouth every 6 (six) hours as needed for headache or moderate pain., Disp: , Rfl:    ibuprofen (ADVIL) 600 MG tablet, Take 1 tablet (600 mg total) by mouth every 6 (six) hours as needed for moderate pain. For AFTER surgery only, Disp: 30 tablet, Rfl: 0   insulin NPH-regular Human (70-30) 100 UNIT/ML injection, Inject 75 Units into the skin 2 (two) times daily. , Disp: , Rfl:    KRILL OIL PO, Take 1 capsule by mouth  daily., Disp: , Rfl:    levonorgestrel (MIRENA) 20 MCG/24HR IUD, 1 Intra Uterine Device (1 each total) by Intrauterine route once for 1 dose. To be placed at [facility] on 8/20, Disp: 1 each, Rfl: 0   losartan (COZAAR) 100 MG tablet, Take 100 mg by mouth every evening. , Disp: , Rfl:    Multiple Vitamins-Minerals (MULTIVITAMIN WITH MINERALS) tablet, Take 1 tablet by mouth daily., Disp: , Rfl:    Allergies  Allergen Reactions   Actos [Pioglitazone]     Unknown reaction   Lisinopril Cough   Metformin And Related     Gas     Past Medical History:  Diagnosis Date   Arthritis    Carpal tunnel syndrome    bilateral, had surgery on right hand   Diabetes mellitus without complication (HCC)    Diverticulitis    Endometrial cancer (HCC)    GERD (gastroesophageal reflux disease)    Gout    History of iron deficiency    Hyperlipidemia    Hypertension    Skin cancer      Past Surgical History:  Procedure Laterality Date   CARPAL TUNNEL RELEASE Right    COLON SURGERY     COLONOSCOPY     x2   COLOSTOMY     COLOSTOMY REVERSAL  DILATATION & CURETTAGE/HYSTEROSCOPY WITH MYOSURE N/A 07/17/2019   Procedure: DILATATION & CURETTAGE/HYSTEROSCOPY WITH MYOSURE;  Surgeon: Louretta Shorten, MD;  Location: [place];  Service: Gynecology;  Laterality: N/A;   OOPHORECTOMY     unilateral   PILONIDAL CYST EXCISION     ROBOTIC ASSISTED TOTAL HYSTERECTOMY WITH BILATERAL SALPINGO OOPHERECTOMY N/A 08/20/2019   Procedure: DIAGNSOTIC LAPAROSCOPY, DILATION AND CURETTAGE, IUD PLACEMENT ;  Surgeon: Everitt Amber, MD;  Location: [facility];  Service: Gynecology;  Laterality: N/A;   SKIN CANCER EXCISION      Family History  Problem Relation Age of Onset   Diabetes Sister    Skin cancer Father     Social History   Tobacco Use   Smoking status: Former    Packs/day: 3.00    Years: 22.00    Total pack years: 66.00    Types: Cigarettes    Quit date: 06/08/1987    Years since quitting: 35.1    Smokeless tobacco: Never  Vaping Use   Vaping Use: Never used  Substance Use Topics   Alcohol use: Yes    Comment: twice per week   Drug use: No    ROS   Objective:   Vitals: BP (!) 142/75   Pulse 72   Temp 97.8 F (36.6 C)   Resp 20   SpO2 91%   Physical Exam Constitutional:      General: She is not in acute distress.    Appearance: Normal appearance. She is well-developed. She is not ill-appearing, toxic-appearing or diaphoretic.  HENT:     Head: Normocephalic and atraumatic.     Nose: Nose normal.     Mouth/Throat:     Mouth: Mucous membranes are moist.  Eyes:     General: No scleral icterus.       Right eye: No discharge.        Left eye: No discharge.     Extraocular Movements: Extraocular movements intact.  Cardiovascular:     Rate and Rhythm: Normal rate.  Pulmonary:     Effort: Pulmonary effort is normal.  Musculoskeletal:        General: Swelling and tenderness present.     Comments: Bilateral lower leg tenderness from the distal calf down to the proximal ankle with associated redness, drainage and weeping.  Multiple open and resolving wounds bilaterally.  Skin:    General: Skin is warm and dry.  Neurological:     General: No focal deficit present.     Mental Status: She is alert and oriented to person, place, and time.  Psychiatric:        Mood and Affect: Mood normal.        Behavior: Behavior normal.     Assessment and Plan :   PDMP not reviewed this encounter.  1. Bilateral lower leg cellulitis   2. Morbid obesity ([place])   3. Type 2 diabetes mellitus treated with insulin ([place])   4. Benign paroxysmal positional vertigo, unspecified laterality    Dressing was applied to both lower legs.  Start doxycycline to address lower leg cellulitis.  She has signs of lipodermatosclerosis, suspect peripheral vascular disease.  Recommended establishing care with a primary care provider and gave her information for this.  Otherwise, I placed a referral to the  wound care clinic to continue providing care for her open wounds on the lower legs. Counseled patient on potential for adverse effects with medications prescribed/recommended today, ER and return-to-clinic precautions discussed, patient verbalized understanding.  Jaynee Eagles, [place] 07/07/22 1205

## 2022-07-07 NOTE — ED Triage Notes (Signed)
Pt here with bilateral lower leg pain and swelling with weaping for several weeks that became worse after moving back to [place] and being in the car for 4 days. Pt has no established care here yet and has barely unpacked.

## 2022-07-07 NOTE — Discharge Instructions (Addendum)
Change your dressing 2-3 times daily. Every time you change your dressing, clean the wound gently with warm water and Dial antibacterial soap. Pat the wound dry, let it breathe for roughly an hour before covering it back up. When you reapply a dressing, use non-stick/non-adherent gauze. Apply Bacitracin ointment to the wound.

## 2022-07-19 ENCOUNTER — Encounter (HOSPITAL_BASED_OUTPATIENT_CLINIC_OR_DEPARTMENT_OTHER): Payer: Medicare Other | Attending: Internal Medicine | Admitting: Internal Medicine

## 2022-07-19 DIAGNOSIS — I890 Lymphedema, not elsewhere classified: Secondary | ICD-10-CM | POA: Insufficient documentation

## 2022-07-19 DIAGNOSIS — I87333 Chronic venous hypertension (idiopathic) with ulcer and inflammation of bilateral lower extremity: Secondary | ICD-10-CM | POA: Insufficient documentation

## 2022-07-19 DIAGNOSIS — C541 Malignant neoplasm of endometrium: Secondary | ICD-10-CM | POA: Insufficient documentation

## 2022-07-19 DIAGNOSIS — L97812 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d: Secondary | ICD-10-CM | POA: Insufficient documentation

## 2022-07-19 DIAGNOSIS — Z87891 Personal history of nicotine dependence: Secondary | ICD-10-CM | POA: Insufficient documentation

## 2022-07-19 DIAGNOSIS — Z794 Long term (current) use of insulin: Secondary | ICD-10-CM | POA: Insufficient documentation

## 2022-07-19 DIAGNOSIS — E11622 Type 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: Secondary | ICD-10-CM | POA: Insufficient documentation

## 2022-07-19 DIAGNOSIS — L97822 Non-pressure chronic ulcer of other part of left lower leg with fat layer exposed: Secondary | ICD-10-CM | POA: Diagnosis not present

## 2022-07-19 NOTE — Progress Notes (Signed)
[name], [name] ([id number]) Visit Report for 07/19/2022 Chief Complaint Document Details Patient Name: Date of Service: [place], [name] 07/19/2022 9:45 A M Medical Record Number: [id number] Patient Account Number: [id number] Date of Birth/Sex: Treating RN: September 18, 1950 (72 y.o. [name] Primary Care Provider: Marlowe Sax Other Clinician: Referring Provider: Treating Provider/Extender: Burnard Leigh in Treatment: 0 Information Obtained from: Patient Chief Complaint 07/19/2022; bilateral lower extremity wounds Electronic Signature(s) Signed: 07/19/2022 2:00:38 PM By: [name] DO Entered By: [name] on 07/19/2022 11:18:40 -------------------------------------------------------------------------------- Debridement Details Patient Name: Date of Service: [name] 07/19/2022 9:45 A M Medical Record Number: [id number] Patient Account Number: [id number] Date of Birth/Sex: Treating RN: 11-04-1950 (72 y.o. [name], Tammi Klippel Primary Care Provider: Marlowe Sax Other Clinician: Referring Provider: Treating Provider/Extender: Burnard Leigh in Treatment: 0 Debridement Performed for Assessment: Wound #1 Left,Circumferential Lower Leg Performed By: Physician [name], DO Debridement Type: Debridement Level of Consciousness (Pre-procedure): Awake and Alert Pre-procedure Verification/Time Out Yes - 11:05 Taken: Start Time: 11:06 Pain Control: Lidocaine 4% T opical Solution T Area Debrided (L x W): otal 10 (cm) x 10 (cm) = 100 (cm) Tissue and other material debrided: Non-Viable, Slough, Slough Level: Non-Viable Tissue Debridement Description: Selective/Open Wound Instrument: Curette Bleeding: Minimum Hemostasis Achieved: Pressure End Time: 11:11 Procedural Pain: 0 Post Procedural Pain: 0 Response to Treatment: Procedure was tolerated well Level of Consciousness (Post- Awake and Alert procedure): Post Debridement  Measurements of Total Wound Length: (cm) 22 Width: (cm) 23 Depth: (cm) 0.1 Volume: (cm) 39.741 Character of Wound/Ulcer Post Debridement: Improved Post Procedure Diagnosis Same as Pre-procedure Electronic Signature(s) Signed: 07/19/2022 2:00:38 PM By: [name] DO Signed: 07/19/2022 5:46:02 PM By: [name] RN, BSN Entered By: [name] on 07/19/2022 11:12:11 -------------------------------------------------------------------------------- Debridement Details Patient Name: Date of Service: [name] 07/19/2022 9:45 A M Medical Record Number: [id number] Patient Account Number: [id number] Date of Birth/Sex: Treating RN: 11-28-1950 (72 y.o. [name], Tammi Klippel Primary Care Provider: Marlowe Sax Other Clinician: Referring Provider: Treating Provider/Extender: Burnard Leigh in Treatment: 0 Debridement Performed for Assessment: Wound #2 Right,Circumferential Lower Leg Performed By: Physician [name], DO Debridement Type: Debridement Level of Consciousness (Pre-procedure): Awake and Alert Pre-procedure Verification/Time Out Yes - 11:05 Taken: Start Time: 11:06 Pain Control: Lidocaine 4% T opical Solution T Area Debrided (L x W): otal 10 (cm) x 10 (cm) = 100 (cm) Tissue and other material debrided: Non-Viable, Slough, Slough Level: Non-Viable Tissue Debridement Description: Selective/Open Wound Instrument: Curette Bleeding: Minimum Hemostasis Achieved: Pressure End Time: 11:11 Procedural Pain: 0 Post Procedural Pain: 0 Response to Treatment: Procedure was tolerated well Level of Consciousness (Post- Awake and Alert procedure): Post Debridement Measurements of Total Wound Length: (cm) 19 Width: (cm) 26 Depth: (cm) 0.1 Volume: (cm) 38.799 Character of Wound/Ulcer Post Debridement: Improved Post Procedure Diagnosis Same as Pre-procedure Electronic Signature(s) Signed: 07/19/2022 2:00:38 PM By: [name] DO Signed:  07/19/2022 5:46:02 PM By: [name] RN, BSN Entered By: [name] on 07/19/2022 11:12:27 -------------------------------------------------------------------------------- HPI Details Patient Name: Date of Service: [name] Leyden, [place] RIA N 07/19/2022 9:45 A M Medical Record Number: [id number] Patient Account Number: [id number] Date of Birth/Sex: Treating RN: February 18, 1950 (72 y.o. [name] Primary Care Provider: Marlowe Sax Other Clinician: Referring Provider: Treating Provider/Extender: Burnard Leigh in Treatment: 0 History of Present Illness HPI Description: Admission 07/19/2022 Ms. Mccayla Shimada Is a 72 year old female with a past medical history of endometrial cancer, lymphedema, insulin-dependent type  2 diabetes that presents to the clinic for a 4-week history of weeping to her lower extremities bilaterally. She visited the ED for this issue on 07/07/2022 and she was prescribed doxycycline For bilateral lower extremity cellulitis. She has completed this course. She currently denies signs of infection. She does not have compression stockings. She states she recently gained weight over the past couple months. She has never had wounds before. Electronic Signature(s) Signed: 07/19/2022 2:00:38 PM By: [name] DO Entered By: [name] on 07/19/2022 11:32:52 -------------------------------------------------------------------------------- Physical Exam Details Patient Name: Date of Service: [name] 07/19/2022 9:45 A M Medical Record Number: [id number] Patient Account Number: [id number] Date of Birth/Sex: Treating RN: 1950/10/16 (72 y.o. [name] Primary Care Provider: Marlowe Sax Other Clinician: Referring Provider: Treating Provider/Extender: Burnard Leigh in Treatment: 0 Constitutional respirations regular, non-labored and within target range for patient.. Cardiovascular 2+ dorsalis pedis/posterior tibialis  pulses. Psychiatric pleasant and cooperative. Notes Bilateral lower extremity weeping with scattered open wounds. Nonviable tissue in the wound bed. Lymphedema skin changes. 3+ pitting edema to the thigh. Electronic Signature(s) Signed: 07/19/2022 2:00:38 PM By: [name] DO Entered By: [name] on 07/19/2022 11:27:33 -------------------------------------------------------------------------------- Physician Orders Details Patient Name: Date of Service: [place], [name] RIA N 07/19/2022 9:45 A M Medical Record Number: [id number] Patient Account Number: [id number] Date of Birth/Sex: Treating RN: 1950/02/28 (72 y.o. [name] Primary Care Provider: Marlowe Sax Other Clinician: Referring Provider: Treating Provider/Extender: Burnard Leigh in Treatment: 0 Verbal / Phone Orders: No Diagnosis Coding ICD-10 Coding Code Description [id number] 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d L97.822 Non-pressure chronic ulcer of other part of left lower leg with fat layer exposed I89.0 Lymphedema, not elsewhere classified I87.333 Chronic venous hypertension (idiopathic) with ulcer and inflammation of bilateral lower extremity E11.622 Type 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 Follow-up Appointments ppointment in 1 week. - Dr. Heber [place] [facility] [place], Room 8 0900 07/27/2022 Friday Return A Other: - Dr. Gwenlyn Found office will call you to schedule arterial studies. Ensure to remove wraps and cover the wounds with bandages before tests. [facility]- will call you if any copays for juxtalites HD compression garments. Bring in weekly to appts. Bathing/ Shower/ Hygiene May shower with protection but do not get wound dressing(s) wet. Edema Control - Lymphedema / SCD / Other Elevate legs to the level of the heart or above for 30 minutes daily and/or when sitting, a frequency of: - 3-4 times a day throughout  the day. Avoid standing for long periods of time. Exercise regularly Wound Treatment Wound #1 - Lower Leg Wound Laterality: Left, Circumferential Peri-Wound Care: Triamcinolone 15 (g) 1 x Per Week/30 Days Discharge Instructions: Use triamcinolone 15 (g) as directed Peri-Wound Care: Sween Lotion (Moisturizing lotion) 1 x Per Week/30 Days Discharge Instructions: Apply moisturizing lotion as directed Prim Dressing: KerraCel Ag Gelling Fiber Dressing, 4x5 in (silver alginate) 1 x Per Week/30 Days ary Discharge Instructions: Apply silver alginate to wound bed as instructed Secondary Dressing: ABD Pad, 8x10 1 x Per Week/30 Days Discharge Instructions: Apply over primary dressing as directed. Compression Wrap: Kerlix Roll 4.5x3.1 (in/yd) 1 x Per Week/30 Days Discharge Instructions: Apply Kerlix and Coban compression as directed. Compression Wrap: Coban Self-Adherent Wrap 4x5 (in/yd) 1 x Per Week/30 Days Discharge Instructions: Apply over Kerlix as directed. Compression Stockings: Circaid Juxta Lite Compression Wrap Left Leg Compression Amount: 30-40 mmHG Discharge Instructions: Apply Circaid  Juxta Lite Compression Wrap daily as instructed. Apply first thing in the morning, remove at night before bed. Wound #2 - Lower Leg Wound Laterality: Right, Circumferential Peri-Wound Care: Triamcinolone 15 (g) 1 x Per Week/30 Days Discharge Instructions: Use triamcinolone 15 (g) as directed Peri-Wound Care: Sween Lotion (Moisturizing lotion) 1 x Per Week/30 Days Discharge Instructions: Apply moisturizing lotion as directed Prim Dressing: KerraCel Ag Gelling Fiber Dressing, 4x5 in (silver alginate) 1 x Per Week/30 Days ary Discharge Instructions: Apply silver alginate to wound bed as instructed Secondary Dressing: ABD Pad, 8x10 1 x Per Week/30 Days Discharge Instructions: Apply over primary dressing as directed. Compression Wrap: Kerlix Roll 4.5x3.1 (in/yd) 1 x Per Week/30 Days Discharge Instructions:  Apply Kerlix and Coban compression as directed. Compression Wrap: Coban Self-Adherent Wrap 4x5 (in/yd) 1 x Per Week/30 Days Discharge Instructions: Apply over Kerlix as directed. Compression Stockings: Circaid Juxta Lite Compression Wrap Right Leg Compression Amount: 30-40 mmHG Discharge Instructions: Apply Circaid Juxta Lite Compression Wrap daily as instructed. Apply first thing in the morning, remove at night before bed. Services and Therapies rterial Studies- Bilateral - ****Dr. Kennon Holter office**** Arterial Studies with ABIs and TBIs r/t poor in clinic ABIs and bilateral leg wounds. - (ICD10 A I89.0 - Lymphedema, not elsewhere classified) Electronic Signature(s) Signed: 07/19/2022 2:00:38 PM By: [name] DO Entered By: [name] on 07/19/2022 11:27:46 Prescription 07/19/2022 -------------------------------------------------------------------------------- Thayer Dallas DO Patient Name: Provider: 1950-08-29 [id number] Date of Birth: NPI#Wanda Plump [id number] Sex: DEA #: [phone] Phone #: License #: [place] Patient Address: [address], [place] [address] [place], [place] [address] [phone] Allergies Actos; metformin; lisinopril Provider's Orders rterial Studies- Bilateral - ICD10: I89.0 - ****Dr. Kennon Holter office**** Arterial Studies with ABIs and TBIs r/t poor in clinic ABIs and bilateral leg wounds. A Hand Signature: Date(s): Electronic Signature(s) Signed: 07/19/2022 2:00:38 PM By: [name] DO Entered By: [name] on 07/19/2022 11:27:46 -------------------------------------------------------------------------------- Problem List Details Patient Name: Date of Service: [name], [name] RIA N 07/19/2022 9:45 A M Medical Record Number: [id number] Patient Account Number: [id number] Date of Birth/Sex: Treating RN: 1950/08/05 (72 y.o. [name],  Tammi Klippel Primary Care Provider: Marlowe Sax Other Clinician: Referring Provider: Treating Provider/Extender: Burnard Leigh in Treatment: 0 Active Problems ICD-10 Encounter Code Description Active Date MDM Diagnosis [phone] Non-pressure chronic ulcer of other part of right lower leg with fat layer 07/19/2022 No Yes exposed L97.822 Non-pressure chronic ulcer of other part of left lower leg with fat layer exposed7/20/2023 No Yes I89.0 Lymphedema, not elsewhere classified 07/19/2022 No Yes I87.333 Chronic venous hypertension (idiopathic) with ulcer and inflammation of 07/19/2022 No Yes bilateral lower extremity E11.622 Type 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 07/19/2022 No Yes 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 07/19/2022 No Yes Inactive Problems Resolved Problems Electronic Signature(s) Signed: 07/19/2022 2:00:38 PM By: [name] DO Entered By: [name] on 07/19/2022 11:18:10 -------------------------------------------------------------------------------- Progress Note Details Patient Name: Date of Service: [name], [place] RIA N 07/19/2022 9:45 A M Medical Record Number: [id number] Patient Account Number: [id number] Date of Birth/Sex: Treating RN: 08-Feb-1950 (72 y.o. [name] Primary Care Provider: Marlowe Sax Other Clinician: Referring Provider: Treating Provider/Extender: Burnard Leigh in Treatment: 0 Subjective Chief Complaint Information obtained from Patient 07/19/2022; bilateral lower extremity wounds History of Present Illness (HPI) Admission 07/19/2022 Ms. Itzamar Traynor Is a 72 year old female with a past medical history of endometrial cancer, lymphedema, insulin-dependent type 2  diabetes that presents to the clinic for a 4-week history of weeping to her lower extremities bilaterally. She visited the ED for this issue on 07/07/2022 and she was prescribed doxycycline For bilateral lower extremity cellulitis. She  has completed this course. She currently denies signs of infection. She does not have compression stockings. She states she recently gained weight over the past couple months. She has never had wounds before. Patient History Information obtained from Patient, Chart. Allergies Actos (Severity: Mild, Reaction: unknown), metformin (Severity: Mild, Reaction: flatulence), lisinopril (Severity: Mild, Reaction: cough) Family History Cancer - Father, Diabetes - Siblings. Social History Former smoker - quit 1988, Marital Status - Married, Alcohol Use - Moderate - cocktails x2 a week, Drug Use - No History, Caffeine Use - Never. Medical History Hematologic/Lymphatic Patient has history of Anemia - iron Cardiovascular Patient has history of Hypertension Endocrine Patient has history of Type II Diabetes Musculoskeletal Patient has history of Gout, Osteoarthritis Oncologic Patient has history of Received Radiation - 2023 Patient is treated with Controlled Diet, Insulin. Blood sugar is tested. Hospitalization/Surgery History - one ovary removed 2020. - pilonidal cyst extraction 1969. - diverculitis temp colostomy. Medical A Surgical History Notes nd Cardiovascular hyperlipidemia Gastrointestinal GERD Diverticulitis Musculoskeletal carpel tunnel bilateral-surgery to right hand Oncologic endometrial cancer skin Ca Review of Systems (ROS) Eyes Complains or has symptoms of Glasses / Contacts - reading. Integumentary (Skin) Complains or has symptoms of Wounds - BLE. Objective Constitutional respirations regular, non-labored and within target range for patient.. Vitals Time Taken: 10:20 AM, Height: 64 in, Source: Stated, Weight: 325 lbs, Source: Stated, BMI: 55.8, Temperature: 98.7 F, Pulse: 77 bpm, Respiratory Rate: 20 breaths/min, Blood Pressure: 145/70 mmHg, Capillary Blood Glucose: 107 mg/dl. Cardiovascular 2+ dorsalis pedis/posterior tibialis pulses. Psychiatric pleasant and  cooperative. General Notes: Bilateral lower extremity weeping with scattered open wounds. Nonviable tissue in the wound bed. Lymphedema skin changes. 3+ pitting edema to the thigh. Integumentary (Hair, Skin) Wound #1 status is Open. Original cause of wound was Gradually Appeared. The date acquired was: 07/05/2022. The wound is located on the Left,Circumferential Lower Leg. The wound measures 22cm length x 23cm width x 0.1cm depth; 397.411cm^2 area and 39.741cm^3 volume. There is Fat Layer (Subcutaneous Tissue) exposed. There is no tunneling or undermining noted. There is a large amount of serosanguineous drainage noted. The wound margin is distinct with the outline attached to the wound base. There is large (67-100%) red, pink granulation within the wound bed. There is a small (1-33%) amount of necrotic tissue within the wound bed including Adherent Slough. Wound #2 status is Open. Original cause of wound was Gradually Appeared. The date acquired was: 07/05/2022. The wound is located on the Right,Circumferential Lower Leg. The wound measures 19cm length x 26cm width x 0.1cm depth; 387.987cm^2 area and 38.799cm^3 volume. There is Fat Layer (Subcutaneous Tissue) exposed. There is no tunneling or undermining noted. There is a large amount of serosanguineous drainage noted. The wound margin is distinct with the outline attached to the wound base. There is medium (34-66%) red, pink granulation within the wound bed. There is a medium (34-66%) amount of necrotic tissue within the wound bed including Adherent Slough. Assessment Active Problems ICD-10 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d Non-pressure chronic ulcer of other part of left lower leg with fat layer exposed Lymphedema, not elsewhere classified Chronic venous hypertension (idiopathic) with ulcer and inflammation of bilateral lower extremity Type 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 Malignant  neoplasm of endometrium Patient presents with bilateral lower extremity weeping and wounds secondary to lymphedema and complicated by recent weight gain. I debrided nonviable tissue. Her ABIs in office for 0.76 on the left and 0.55 on the right. She has 2+ pedal pulses. Results may be skewed due to swelling. We will order formal ABIs. For now I recommended silver alginate under Kerlix/Coban. We discussed the importance of weight loss to help with prevention of wounds. She expressed understanding. She will need compression garments once these wounds are healed. We will go ahead and order juxta light compression. 46 minutes was spent on the encounter including face-to-face, EMR review and coordination of care Procedures Wound #1 Pre-procedure diagnosis of Wound #1 is a Lymphedema located on the Left,Circumferential Lower Leg . There was a Selective/Open Wound Non-Viable Tissue Debridement with a total area of 100 sq cm performed by [name], DO. With the following instrument(s): Curette to remove Non-Viable tissue/material. Material removed includes [facility] after achieving pain control using Lidocaine 4% Topical Solution. A time out was conducted at 11:05, prior to the start of the procedure. A Minimum amount of bleeding was controlled with Pressure. The procedure was tolerated well with a pain level of 0 throughout and a pain level of 0 following the procedure. Post Debridement Measurements: 22cm length x 23cm width x 0.1cm depth; 39.741cm^3 volume. Character of Wound/Ulcer Post Debridement is improved. Post procedure Diagnosis Wound #1: Same as Pre-Procedure Wound #2 Pre-procedure diagnosis of Wound #2 is a Lymphedema located on the Right,Circumferential Lower Leg . There was a Selective/Open Wound Non-Viable Tissue Debridement with a total area of 100 sq cm performed by [name], DO. With the following instrument(s): Curette to remove Non-Viable tissue/material. Material removed  includes [facility] after achieving pain control using Lidocaine 4% Topical Solution. A time out was conducted at 11:05, prior to the start of the procedure. A Minimum amount of bleeding was controlled with Pressure. The procedure was tolerated well with a pain level of 0 throughout and a pain level of 0 following the procedure. Post Debridement Measurements: 19cm length x 26cm width x 0.1cm depth; 38.799cm^3 volume. Character of Wound/Ulcer Post Debridement is improved. Post procedure Diagnosis Wound #2: Same as Pre-Procedure Plan Follow-up Appointments: Return Appointment in 1 week. - Dr. Heber [place] [facility] [place], Room 8 0900 07/27/2022 Friday Other: - Dr. Gwenlyn Found office will call you to schedule arterial studies. Ensure to remove wraps and cover the wounds with bandages before tests. [facility]- will call you if any copays for juxtalites HD compression garments. Bring in weekly to appts. Bathing/ Shower/ Hygiene: May shower with protection but do not get wound dressing(s) wet. Edema Control - Lymphedema / SCD / Other: Elevate legs to the level of the heart or above for 30 minutes daily and/or when sitting, a frequency of: - 3-4 times a day throughout the day. Avoid standing for long periods of time. Exercise regularly Services and Therapies ordered were: Arterial Studies- Bilateral - ****Dr. Kennon Holter office**** Arterial Studies with ABIs and TBIs r/t poor in clinic ABIs and bilateral leg wounds. WOUND #1: - Lower Leg Wound Laterality: Left, Circumferential Peri-Wound Care: Triamcinolone 15 (g) 1 x Per Week/30 Days Discharge Instructions: Use triamcinolone 15 (g) as directed Peri-Wound Care: Sween Lotion (Moisturizing lotion) 1 x Per Week/30 Days Discharge Instructions: Apply moisturizing lotion as directed Prim Dressing: KerraCel Ag Gelling Fiber Dressing, 4x5 in (silver alginate) 1 x Per Week/30 Days ary Discharge Instructions: Apply silver alginate to wound bed as instructed Secondary Dressing: ABD  Pad,  8x10 1 x Per Week/30 Days Discharge Instructions: Apply over primary dressing as directed. Com pression Wrap: Kerlix Roll 4.5x3.1 (in/yd) 1 x Per Week/30 Days Discharge Instructions: Apply Kerlix and Coban compression as directed. Com pression Wrap: Coban Self-Adherent Wrap 4x5 (in/yd) 1 x Per Week/30 Days Discharge Instructions: Apply over Kerlix as directed. Com pression Stockings: Circaid Juxta Lite Compression Wrap Compression Amount: 30-40 mmHg (left) Discharge Instructions: Apply Circaid Juxta Lite Compression Wrap daily as instructed. Apply first thing in the morning, remove at night before bed. WOUND #2: - Lower Leg Wound Laterality: Right, Circumferential Peri-Wound Care: Triamcinolone 15 (g) 1 x Per Week/30 Days Discharge Instructions: Use triamcinolone 15 (g) as directed Peri-Wound Care: Sween Lotion (Moisturizing lotion) 1 x Per Week/30 Days Discharge Instructions: Apply moisturizing lotion as directed Prim Dressing: KerraCel Ag Gelling Fiber Dressing, 4x5 in (silver alginate) 1 x Per Week/30 Days ary Discharge Instructions: Apply silver alginate to wound bed as instructed Secondary Dressing: ABD Pad, 8x10 1 x Per Week/30 Days Discharge Instructions: Apply over primary dressing as directed. Com pression Wrap: Kerlix Roll 4.5x3.1 (in/yd) 1 x Per Week/30 Days Discharge Instructions: Apply Kerlix and Coban compression as directed. Com pression Wrap: Coban Self-Adherent Wrap 4x5 (in/yd) 1 x Per Week/30 Days Discharge Instructions: Apply over Kerlix as directed. Com pression Stockings: Circaid Juxta Lite Compression Wrap Compression Amount: 30-40 mmHg (right) Discharge Instructions: Apply Circaid Juxta Lite Compression Wrap daily as instructed. Apply first thing in the morning, remove at night before bed. 1. In office sharp debridement 2. Silver alginate under Kerlix/Coban 3. ABIs with TBI's 4. Follow-up in 1 week Electronic Signature(s) Signed: 07/19/2022 2:00:38 PM By:  [name] DO Entered By: [name] on 07/19/2022 11:33:53 -------------------------------------------------------------------------------- HxROS Details Patient Name: Date of Service: [name] Ley[place], [place] RIA N 07/19/2022 9:45 A M Medical Record Number: [id number] Patient Account Number: [id number] Date of Birth/Sex: Treating RN: 1950/03/29 (72 y.o. [name] Primary Care Provider: Marlowe Sax Other Clinician: Referring Provider: Treating Provider/Extender: Burnard Leigh in Treatment: 0 Information Obtained From Patient Chart Eyes Complaints and Symptoms: Positive for: Glasses / Contacts - reading Integumentary (Skin) Complaints and Symptoms: Positive for: Wounds - BLE Hematologic/Lymphatic Medical History: Positive for: Anemia - iron Cardiovascular Medical History: Positive for: Hypertension Past Medical History Notes: hyperlipidemia Gastrointestinal Medical History: Past Medical History Notes: GERD Diverticulitis Endocrine Medical History: Positive for: Type II Diabetes Time with diabetes: 72 years old Treated with: Insulin, Diet Blood sugar tested every day: Yes Tested : three times a week. Musculoskeletal Medical History: Positive for: Gout; Osteoarthritis Past Medical History Notes: carpel tunnel bilateral-surgery to right hand Oncologic Medical History: Positive for: Received Radiation - 2023 Past Medical History Notes: endometrial cancer skin Ca Immunizations Pneumococcal Vaccine: Received Pneumococcal Vaccination: Yes Received Pneumococcal Vaccination On or After 60th Birthday: No Implantable Devices No devices added Hospitalization / Surgery History Type of Hospitalization/Surgery one ovary removed 2020 pilonidal cyst extraction 1969 diverculitis temp colostomy Family and Social History Cancer: Yes - Father; Diabetes: Yes - Siblings; Former smoker - quit 1988; Marital Status - Married; Alcohol Use: Moderate -  cocktails x2 a week; Drug Use: No History; Caffeine Use: Never; Financial Concerns: No; Food, Clothing or Shelter Needs: No; Support System Lacking: No; Transportation Concerns: No Electronic Signature(s) Signed: 07/19/2022 2:00:38 PM By: [name] DO Signed: 07/19/2022 5:46:02 PM By: [name] RN, BSN Entered By: [name] on 07/19/2022 10:30:08 -------------------------------------------------------------------------------- SuperBill Details Patient Name: Date of Service: [name], [place] 07/19/2022 Medical Record  Number: [id number] Patient Account Number: [id number] Date of Birth/Sex: Treating RN: 02-05-1950 (72 y.o. [name], Tammi Klippel Primary Care Provider: Marlowe Sax Other Clinician: Referring Provider: Treating Provider/Extender: Burnard Leigh in Treatment: 0 Diagnosis Coding ICD-10 Codes Code Description [phone] 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d L97.822 Non-pressure chronic ulcer of other part of left lower leg with fat layer exposed I89.0 Lymphedema, not elsewhere classified I87.333 Chronic venous hypertension (idiopathic) with ulcer and inflammation of bilateral lower extremity E11.622 Type 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 Facility Procedures CPT4 Code: 88416606 Description: 99213 - WOUND CARE VISIT-LEV 3 EST PT Modifier: Quantity: 1 CPT4 Code: [id number] Description: 32355 - DEBRIDE WOUND 1ST 20 SQ CM OR < ICD-10 Diagnosis Description L97.812 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 L97.822 Non-pressure chronic ulcer of other part of left lower leg with fat layer  exposed Modifier: d Quantity: 1 CPT4 Code: 73220254 Description: 27062 - DEBRIDE WOUND EA ADDL 20 SQ CM ICD-10 Diagnosis Description L97.812 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 L97.822  Non-pressure chronic ulcer of other part of left lower leg with fat layer  exposed Modifier: d Quantity: 9 Physician Procedures : CPT4 Code Description Modifier [id number] 51761 - WC PHYS LEVEL 4 - NEW PT ICD-10 Diagnosis Description L97.812 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d L97.822 Non-pressure chronic ulcer of other part of left  lower leg with fat layer exposed I89.0 Lymphedema, not elsewhere classified E11.622 Type 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 Quantity: 1 : [id number] 62694 - WC PHYS DEBR WO ANESTH 20 SQ CM ICD-10 Diagnosis Description L97.812 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d L97.822 Non-pressure chronic ulcer of other part of left lower leg with fat layer  exposed Quantity: 1 : [id number] 35009 - WC PHYS DEBR WO ANESTH EA ADD 20 CM 9 ICD-10 Diagnosis Description L97.812 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d L97.822 Non-pressure chronic ulcer of other part of left lower leg with fat  layer exposed Quantity: Electronic Signature(s) Signed: 07/19/2022 2:00:38 PM By: [name] DO Entered By: [name] on 07/19/2022 11:34:19

## 2022-07-19 NOTE — Progress Notes (Signed)
[name] [name] [name] ([id number]) Visit Report for 07/19/2022 Abuse Risk Screen Details Patient Name: Date of Service: [name] [name], [name] [name] 07/19/2022 9:45 A M Medical Record Number: [id number] Patient Account Number: [id number] Date of Birth/Sex: Treating RN: February 11, 1950 (72 y.o. [name] [name] [name] Primary Care [name] [name] [name]: [name] [name] [name] Other Clinician: Referring [name] [name] [name]: Treating [name] [name] [name]/Extender: [name] [name] [name] in Treatment: 0 Abuse Risk Screen Items Answer ABUSE RISK SCREEN: Has anyone close to you tried to hurt or harm you recentlyo No Do you feel uncomfortable with anyone in your familyo No Has anyone forced you do things that you didnt want to doo No Electronic Signature(s) Signed: 07/19/2022 5:46:02 PM By: [name] RN, BSN Entered By: [name] on 07/19/2022 10:22:17 -------------------------------------------------------------------------------- Activities of Daily Living Details Patient Name: Date of Service: [name] [name], [name] 07/19/2022 9:45 A M Medical Record Number: [id number] Patient Account Number: [id number] Date of Birth/Sex: Treating RN: 07-07-1950 (72 y.o. [name] [name] [name] Primary Care [name] [name] [name]: [name] [name] [name] Other Clinician: Referring Abbigaile Rockman: Treating [name] [name] [name]: [name] [name] [name] in Treatment: 0 Activities of Daily Living Items Answer Activities of Daily Living (Please select one for each item) Drive Automobile Not Able T Medications ake Completely Able Use T elephone Completely Able Care for Appearance Completely Able Use T oilet Completely Able Bath / Shower Completely Able Dress Self Completely Able Feed Self Completely Able Walk Need Assistance Get In / Out Bed Completely [place] for Self Need Assistance Electronic Signature(s) Signed: 07/19/2022 5:46:02 PM By: [name] RN, BSN Entered By: [name] on 07/19/2022  10:22:37 -------------------------------------------------------------------------------- Education Screening Details Patient Name: Date of Service: [name] [name] Yan, [name] 07/19/2022 9:45 A M Medical Record Number: [id number] Patient Account Number: [id number] Date of Birth/Sex: Treating RN: 1950/12/08 (72 y.o. [name] [name] [name] Primary Care [name] [name] [name]: [name] [name] [name] Other Clinician: Referring [name] [name] [name]: Treating [name] [name] [name]/Extender: [name] [name] [name] in Treatment: 0 Primary Learner Assessed: Patient Learning Preferences/Education Level/Primary Language Learning Preference: Explanation, Demonstration, Printed Material Highest Education Level: College or Above Preferred Language: English Cognitive Barrier Language Barrier: No Translator Needed: No Memory Deficit: No Emotional Barrier: No Cultural/Religious Beliefs Affecting Medical Care: No Physical Barrier Impaired Vision: Yes Glasses, reading Impaired Hearing: No Decreased Hand dexterity: No Knowledge/Comprehension Knowledge Level: High Comprehension Level: High Ability to understand written instructions: High Ability to understand verbal instructions: High Motivation Anxiety Level: Calm Cooperation: Cooperative Education Importance: Acknowledges Need Interest in Health Problems: Asks Questions Perception: Coherent Willingness to Engage in Self-Management High Activities: Readiness to Engage in Self-Management High Activities: Electronic Signature(s) Signed: 07/19/2022 5:46:02 PM By: [name] RN, BSN Entered By: [name] on 07/19/2022 10:23:01 -------------------------------------------------------------------------------- Fall Risk Assessment Details Patient Name: Date of Service: [name] [name] [place], [name] [name] [name] 07/19/2022 9:45 A M Medical Record Number: [id number] Patient Account Number: [id number] Date of Birth/Sex: Treating RN: 1950/09/22 (72 y.o. [name] [name] [name] Primary Care Taiven Greenley: [name] [name] [name] Other  Clinician: Referring [name] [name] [name]: Treating [name] [name] [name]/Extender: [name] [name] [name] in Treatment: 0 Fall Risk Assessment Items Have you had 2 or more falls in the last 12 monthso 0 Yes Have you had any fall that resulted in injury in the last 12 monthso 0 No FALLS RISK SCREEN History of falling - immediate or within 3 months 0 No Secondary diagnosis (Do you have 2 or more medical diagnoseso) 0 No Ambulatory aid None/bed rest/wheelchair/nurse 0 Yes Crutches/cane/walker 0 No Furniture 30 Yes Intravenous therapy Access/Saline/Heparin Lock 0 No Gait/Transferring Normal/ bed rest/ wheelchair  0 No Weak (short steps with or without shuffle, stooped but able to lift head while walking, may seek 10 Yes support from furniture) Impaired (short steps with shuffle, may have difficulty arising from chair, head down, impaired 0 No balance) Mental Status Oriented to own ability 0 Yes Electronic Signature(s) Signed: 07/19/2022 5:46:02 PM By: [name] RN, BSN Entered By: [name] on 07/19/2022 10:30:29 -------------------------------------------------------------------------------- Nutrition Risk Screening Details Patient Name: Date of Service: [name] [name], [name] 07/19/2022 9:45 A M Medical Record Number: [id number] Patient Account Number: [id number] Date of Birth/Sex: Treating RN: [birth year] (72 y.o. [name] [name] [name] Primary Care Donnamaria Shands: [name] [name] [name] Other Clinician: Referring Verlyn Lambert: Treating [name] [name] [name]/Extender: [name] [name] [name] in Treatment: 0 Height (in): Weight (lbs): Body Mass Index (BMI): Nutrition Risk Screening Items Score Screening NUTRITION RISK SCREEN: I have an illness or condition that made me change the kind and/or amount of food I eat 2 Yes I eat fewer than two meals per day 0 No I eat few fruits and vegetables, or milk products 0 No I have three or more drinks of beer, liquor or wine almost every day 0 No I have tooth or mouth  problems that make it hard for me to eat 0 No I don't always have enough money to buy the food I need 0 No I eat alone most of the time 0 No I take three or more different prescribed or over-the-counter drugs a day 1 Yes Without wanting to, I have lost or gained 10 pounds in the last six months 0 No I am not always physically able to shop, cook and/or feed myself 0 No Nutrition Protocols Good Risk Protocol Provide education on elevated blood Moderate Risk Protocol 0 sugars and impact on wound healing, as applicable High Risk Proctocol Risk Level: Moderate Risk Score: 3 Electronic Signature(s) Signed: 07/19/2022 5:46:02 PM By: [name] RN, BSN Entered By: [name] on 07/19/2022 10:23:43

## 2022-07-19 NOTE — Progress Notes (Signed)
VERNESTINE, BRODHEAD ([id number]) Visit Report for 07/19/2022 Allergy List Details Patient Name: Date of Service: [name], [name] [name] 07/19/2022 9:45 A M Medical Record Number: [id number] Patient Account Number: [id number] Date of Birth/Sex: Treating RN: [birth year] (72 y.o. [name] Primary Care Gisel Vipond: Marlowe Sax Other Clinician: Referring Zaviyar Rahal: Treating Shamyra Farias/Extender: Burnard Leigh in Treatment: 0 Allergies Active Allergies Actos Reaction: unknown Severity: Mild metformin Reaction: flatulence Severity: Mild lisinopril Reaction: cough Severity: Mild Allergy Notes Electronic Signature(s) Signed: 07/19/2022 5:46:02 PM By: [name] RN, BSN Entered By: [name] on 07/18/2022 16:14:14 -------------------------------------------------------------------------------- Arrival Information Details Patient Name: Date of Service: [name] 07/19/2022 9:45 A M Medical Record Number: [id number] Patient Account Number: [id number] Date of Birth/Sex: Treating RN: 1950/08/20 (72 y.o. [name] Primary Care Keita Demarco: Marlowe Sax Other Clinician: Referring Rickie Gange: Treating Kasir Hallenbeck/Extender: Burnard Leigh in Treatment: 0 Visit Information Patient Arrived: Wheel Chair Arrival Time: 10:20 Accompanied By: self Transfer Assistance: None Patient Identification Verified: Yes Secondary Verification Process Completed: Yes Patient Requires Transmission-Based Precautions: No Patient Has Alerts: No Electronic Signature(s) Signed: 07/19/2022 5:46:02 PM By: [name] RN, BSN Entered By: [name] on 07/19/2022 10:22:08 -------------------------------------------------------------------------------- Clinic Level of Care Assessment Details Patient Name: Date of Service: [name], [name] 07/19/2022 9:45 A M Medical Record Number: [id number] Patient Account Number: [id number] Date of Birth/Sex: Treating RN: 1950-09-03 (72  y.o. [name] Primary Care Lucilia Yanni: Marlowe Sax Other Clinician: Referring Arne Schlender: Treating Cire Clute/Extender: Burnard Leigh in Treatment: 0 Clinic Level of Care Assessment Items TOOL 1 Quantity Score X- 1 0 Use when EandM and Procedure is performed on INITIAL visit ASSESSMENTS - Nursing Assessment / Reassessment X- 1 20 General Physical Exam (combine w/ comprehensive assessment (listed just below) when performed on new pt. evals) X- 1 25 Comprehensive Assessment (HX, ROS, Risk Assessments, Wounds Hx, etc.) ASSESSMENTS - Wound and Skin Assessment / Reassessment X- 1 10 Dermatologic / Skin Assessment (not related to wound area) ASSESSMENTS - Ostomy and/or Continence Assessment and Care []  - 0 Incontinence Assessment and Management []  - 0 Ostomy Care Assessment and Management (repouching, etc.) PROCESS - Coordination of Care []  - 0 Simple Patient / Family Education for ongoing care X- 1 20 Complex (extensive) Patient / Family Education for ongoing care X- 1 10 Staff obtains Programmer, systems, Records, T Results / Process Orders est []  - 0 Staff telephones HHA, Nursing Homes / Clarify orders / etc []  - 0 Routine Transfer to another Facility (non-emergent condition) []  - 0 Routine Hospital Admission (non-emergent condition) X- 1 15 New Admissions / Biomedical engineer / Ordering NPWT Apligraf, etc. , []  - 0 Emergency Hospital Admission (emergent condition) PROCESS - Special Needs []  - 0 Pediatric / Minor Patient Management []  - 0 Isolation Patient Management []  - 0 Hearing / Language / Visual special needs []  - 0 Assessment of Community assistance (transportation, D/C planning, etc.) []  - 0 Additional assistance / Altered mentation []  - 0 Support Surface(s) Assessment (bed, cushion, seat, etc.) INTERVENTIONS - Miscellaneous []  - 0 External ear exam []  - 0 Patient Transfer (multiple staff / Civil Service fast streamer / Similar devices) []  -  0 Simple Staple / Suture removal (25 or less) []  - 0 Complex Staple / Suture removal (26 or more) []  - 0 Hypo/Hyperglycemic Management (do not check if billed separately) X- 1 15 Ankle / Brachial Index (ABI) - do not check if billed separately Has the patient been seen at the hospital within  the last three years: Yes Total Score: 115 Level Of Care: New/Established - Level 3 Electronic Signature(s) Signed: 07/19/2022 5:46:02 PM By: [name] RN, BSN Entered By: [name] on 07/19/2022 11:16:23 -------------------------------------------------------------------------------- Encounter Discharge Information Details Patient Name: Date of Service: [name], [name] 07/19/2022 9:45 A M Medical Record Number: [id number] Patient Account Number: [id number] Date of Birth/Sex: Treating RN: 02/09/1950 (72 y.o. [name] Primary Care Jashaun Penrose: Marlowe Sax Other Clinician: Referring Corleone Biegler: Treating Rylee Huestis/Extender: Burnard Leigh in Treatment: 0 Encounter Discharge Information Items Post Procedure Vitals Discharge Condition: Stable Temperature (F): 98.7 Ambulatory Status: Wheelchair Pulse (bpm): 77 Discharge Destination: Home Respiratory Rate (breaths/min): 20 Transportation: Private Auto Blood Pressure (mmHg): 145/70 Accompanied By: self Schedule Follow-up Appointment: Yes Clinical Summary of Care: Electronic Signature(s) Signed: 07/19/2022 5:46:02 PM By: [name] RN, BSN Entered By: [name] on 07/19/2022 11:17:17 -------------------------------------------------------------------------------- Lower Extremity Assessment Details Patient Name: Date of Service: [name], [name] 07/19/2022 9:45 A M Medical Record Number: [id number] Patient Account Number: [id number] Date of Birth/Sex: Treating RN: 1950/01/13 (72 y.o. [name] Primary Care Taygen Acklin: Marlowe Sax Other Clinician: Referring Orazio Weller: Treating Jahsiah Carpenter/Extender:  Burnard Leigh in Treatment: 0 Edema Assessment Assessed: [Left: Yes] [Right: Yes] Edema: [Left: Yes] [Right: Yes] Calf Left: Right: Point of Measurement: 39 cm From Medial Instep 57 cm 58.5 cm Ankle Left: Right: Point of Measurement: 10 cm From Medial Instep 31 cm 29.5 cm Knee To Floor Left: Right: From Medial Instep 41 cm 41 cm Vascular Assessment Pulses: Dorsalis Pedis Palpable: [Left:Yes] [Right:Yes] Doppler Audible: [Left:Yes] Posterior Tibial Palpable: [Left:Yes] Doppler Audible: [Left:Yes] Blood Pressure: Brachial: [Left:145] Ankle: [Left:Dorsalis Pedis: 110 0.76] Electronic Signature(s) Signed: 07/19/2022 5:46:02 PM By: [name] RN, BSN Entered By: [name] on 07/19/2022 11:06:48 -------------------------------------------------------------------------------- Multi Wound Chart Details Patient Name: Date of Service: [name] 07/19/2022 9:45 A M Medical Record Number: [id number] Patient Account Number: [id number] Date of Birth/Sex: Treating RN: Jun 02, 1950 (72 y.o. [name], [name] Primary Care Mardy Lucier: Marlowe Sax Other Clinician: Referring Arsal Tappan: Treating Teaira Croft/Extender: Burnard Leigh in Treatment: 0 Vital Signs Height(in): 64 Capillary Blood Glucose(mg/dl): 107 Weight(lbs): 325 Pulse(bpm): 108 Body Mass Index(BMI): 55.8 Blood Pressure(mmHg): 145/70 Temperature(F): 98.7 Respiratory Rate(breaths/min): 20 Photos: [[name]:[name]] Left, Circumferential Lower Leg Right, Circumferential Lower Leg [name] Wound Location: Gradually Appeared Gradually Appeared [name] Wounding Event: Lymphedema Lymphedema [name] Primary Etiology: Anemia, Hypertension, Type II Anemia, Hypertension, Type II [name] Comorbid History: Diabetes, Gout, Osteoarthritis, Diabetes, Gout, Osteoarthritis, Received Radiation Received Radiation 07/05/2022 07/05/2022 [name] Date Acquired: 0 0 [name] Weeks of Treatment: Open Open [name] Wound  Status: No No [name] Wound Recurrence: Yes Yes [name] Clustered Wound: 4 4 [name] Clustered Quantity: 22x23x0.1 19x26x0.1 [name] Measurements L x W x D (cm) 397.411 387.987 [name] A (cm) : rea 39.741 38.799 [name] Volume (cm) : 0.00% 0.00% [name] % Reduction in Area: 0.00% 0.00% [name] % Reduction in Volume: Full Thickness Without Exposed Full Thickness Without Exposed [name] Classification: Support Structures Support Structures Large Large [name] Exudate Amount: Serosanguineous Serosanguineous [name] Exudate Type: red, brown red, brown [name] Exudate Color: Distinct, outline attached Distinct, outline attached [name] Wound Margin: Large (67-100%) Medium (34-66%) [name] Granulation Amount: Red, Pink Red, Pink [name] Granulation Quality: Small (1-33%) Medium (34-66%) [name] Necrotic Amount: Fat Layer (Subcutaneous Tissue): Yes Fat Layer (Subcutaneous Tissue): Yes [name] Exposed Structures: Fascia: No Fascia: No Tendon: No Tendon: No Muscle: No Muscle: No Joint: No Joint: No Bone: No Bone: No None None [name] Epithelialization:  Debridement - Selective/Open Wound Debridement - Selective/Open Wound [name] Debridement: Pre-procedure Verification/Time Out 11:05 11:05 [name] Taken: Lidocaine 4% Topical Solution Lidocaine 4% Topical Solution [name] Pain Control: [facility] [name] Tissue Debrided: Non-Viable Tissue Non-Viable Tissue [name] Level: 100 100 [name] Debridement A (sq cm): rea Curette Curette [name] Instrument: Minimum Minimum [name] Bleeding: Pressure Pressure [name] Hemostasis A chieved: 0 0 [name] Procedural Pain: 0 0 [name] Post Procedural Pain: Procedure was tolerated well Procedure was tolerated well [name] Debridement Treatment Response: 22x23x0.1 19x26x0.1 [name] Post Debridement Measurements L x W x D (cm) 39.741 38.799 [name] Post Debridement Volume: (cm) Debridement Debridement [name] Procedures Performed: Treatment Notes Wound #1 (Lower Leg) Wound Laterality: Left, Circumferential Cleanser Peri-Wound  Care Triamcinolone 15 (g) Discharge Instruction: Use triamcinolone 15 (g) as directed Sween Lotion (Moisturizing lotion) Discharge Instruction: Apply moisturizing lotion as directed Topical Primary Dressing KerraCel Ag Gelling Fiber Dressing, 4x5 in (silver alginate) Discharge Instruction: Apply silver alginate to wound bed as instructed Secondary Dressing ABD Pad, 8x10 Discharge Instruction: Apply over primary dressing as directed. Secured With Compression Wrap Kerlix Roll 4.5x3.1 (in/yd) Discharge Instruction: Apply Kerlix and Coban compression as directed. Coban Self-Adherent Wrap 4x5 (in/yd) Discharge Instruction: Apply over Kerlix as directed. Compression Stockings Circaid Juxta Lite Compression Wrap Quantity: 1 Left Leg Compression Amount: 30-40 mmHg Discharge Instruction: Apply Circaid Juxta Lite Compression Wrap daily as instructed. Apply first thing in the morning, remove at night before bed. Add-Ons Wound #2 (Lower Leg) Wound Laterality: Right, Circumferential Cleanser Peri-Wound Care Triamcinolone 15 (g) Discharge Instruction: Use triamcinolone 15 (g) as directed Sween Lotion (Moisturizing lotion) Discharge Instruction: Apply moisturizing lotion as directed Topical Primary Dressing KerraCel Ag Gelling Fiber Dressing, 4x5 in (silver alginate) Discharge Instruction: Apply silver alginate to wound bed as instructed Secondary Dressing ABD Pad, 8x10 Discharge Instruction: Apply over primary dressing as directed. Secured With Compression Wrap Kerlix Roll 4.5x3.1 (in/yd) Discharge Instruction: Apply Kerlix and Coban compression as directed. Coban Self-Adherent Wrap 4x5 (in/yd) Discharge Instruction: Apply over Kerlix as directed. Compression Stockings Circaid Juxta Lite Compression Wrap Quantity: 1 Right Leg Compression Amount: 30-40 mmHg Discharge Instruction: Apply Circaid Juxta Lite Compression Wrap daily as instructed. Apply first thing in the morning, remove  at night before bed. Add-Ons Electronic Signature(s) Signed: 07/19/2022 2:00:38 PM By: [name] DO Signed: 07/19/2022 5:46:02 PM By: [name] RN, BSN Entered By: [name] on 07/19/2022 11:18:17 -------------------------------------------------------------------------------- Multi-Disciplinary Care Plan Details Patient Name: Date of Service: [place], [name] 07/19/2022 9:45 A M Medical Record Number: [id number] Patient Account Number: [id number] Date of Birth/Sex: Treating RN: 09/12/1950 (72 y.o. [name] Primary Care Simmie Camerer: Marlowe Sax Other Clinician: Referring Taniqua Issa: Treating Ingrid Shifrin/Extender: Burnard Leigh in Treatment: 0 Active Inactive Orientation to the Wound Care Program Nursing Diagnoses: Knowledge deficit related to the wound healing [name] program Goals: Patient/caregiver will verbalize understanding of the [facility] Program Date Initiated: 07/19/2022 Target Resolution Date: 08/03/2022 Goal Status: Active Interventions: Provide education on orientation to the wound [name] Notes: Pain, Acute or Chronic Nursing Diagnoses: Pain, acute or chronic: actual or potential Potential alteration in comfort, pain Goals: Patient will verbalize adequate pain control and receive pain control interventions during procedures as needed Date Initiated: 07/19/2022 Target Resolution Date: 08/03/2022 Goal Status: Active Patient/caregiver will verbalize comfort level met Date Initiated: 07/19/2022 Target Resolution Date: 08/02/2022 Goal Status: Active Interventions: Encourage patient to take pain medications as prescribed Provide education on pain management Reposition patient for comfort Treatment Activities: Administer pain control measures as ordered :  07/19/2022 Notes: Tissue Oxygenation Nursing Diagnoses: Knowledge deficit related to disease process and management Goals: Non-invasive arterial studies are completed  as ordered Date Initiated: 07/19/2022 Target Resolution Date: 08/03/2022 Goal Status: Active Interventions: Assess patient understanding of disease process and management upon diagnosis and as needed Provide education on tissue oxygenation and ischemia Treatment Activities: Ankle Brachial Index (ABI) : 07/19/2022 Notes: Venous Leg Ulcer Nursing Diagnoses: Actual venous Insuffiency (use after diagnosis is confirmed) Potential for venous Insuffiency (use before diagnosis confirmed) Goals: Non-invasive venous studies are completed as ordered Date Initiated: 07/19/2022 Target Resolution Date: 08/03/2022 Goal Status: Active Interventions: Assess peripheral edema status every visit. Compression as ordered Provide education on venous insufficiency Treatment Activities: Non-invasive vascular studies : 07/19/2022 Notes: Wound/Skin Impairment Nursing Diagnoses: Knowledge deficit related to ulceration/compromised skin integrity Goals: Patient/caregiver will verbalize understanding of skin care regimen Date Initiated: 07/19/2022 Target Resolution Date: 08/02/2022 Goal Status: Active Interventions: Assess patient/caregiver ability to perform ulcer/skin care regimen upon admission and as needed Assess ulceration(s) every visit Provide education on ulcer and skin care Treatment Activities: Skin care regimen initiated : 07/19/2022 Topical wound management initiated : 07/19/2022 Notes: Electronic Signature(s) Signed: 07/19/2022 5:46:02 PM By: [name] RN, BSN Entered By: [name] on 07/19/2022 11:01:26 -------------------------------------------------------------------------------- Pain Assessment Details Patient Name: Date of Service: [name] 07/19/2022 9:45 A M Medical Record Number: [id number] Patient Account Number: [id number] Date of Birth/Sex: Treating RN: [birth year] (72 y.o. [name] Primary Care Chelbie Jarnagin: Marlowe Sax Other Clinician: Referring  Amier Hoyt: Treating Latana Colin/Extender: Burnard Leigh in Treatment: 0 Active Problems Location of Pain Severity and Description of Pain Patient Has Paino Yes Site Locations Pain Location: Generalized Pain, Pain in Ulcers Rate the pain. Current Pain Level: 2 Pain Management and Medication Current Pain Management: Medication: No Cold Application: No Rest: No Massage: No Activity: No T.E.[name].S.: No Heat Application: No Leg drop or elevation: No Is the Current Pain Management Adequate: Adequate How does your wound impact your activities of daily livingo Sleep: No Bathing: No Appetite: No Relationship With Others: No Bladder Continence: No Emotions: No Bowel Continence: No Work: No Toileting: No Drive: No Dressing: No Hobbies: No Engineer, maintenance) Signed: 07/19/2022 5:46:02 PM By: [name] RN, BSN Entered By: [name] on 07/19/2022 10:23:56 -------------------------------------------------------------------------------- Patient/Caregiver Education Details Patient Name: Date of Service: [name] 7/20/2023andnbsp9:45 [place] Record Number: [id number] Patient Account Number: [id number] Date of Birth/Gender: Treating RN: 22-Sep-1950 (73 y.o. [name] Primary Care Physician: Marlowe Sax Other Clinician: Referring Physician: Treating Physician/Extender: Burnard Leigh in Treatment: 0 Education Assessment Education Provided To: Patient Education Topics Provided Venous: Handouts: Controlling Swelling with Multilayered Compression Wraps Methods: Explain/Verbal, Printed Responses: Reinforcements needed Yeager: o Handouts: Welcome T [facility] o Methods: Explain/Verbal, Printed Responses: Reinforcements needed Electronic Signature(s) Signed: 07/19/2022 5:46:02 PM By: [name] RN, BSN Entered By: [name] on 07/19/2022  11:07:38 -------------------------------------------------------------------------------- Wound Assessment Details Patient Name: Date of Service: [name], [name] 07/19/2022 9:45 A M Medical Record Number: [id number] Patient Account Number: [id number] Date of Birth/Sex: Treating RN: 03-10-1950 (72 y.o. [name] Primary Care Gurtha Picker: Marlowe Sax Other Clinician: Referring Camara Rosander: Treating Cariana Karge/Extender: Burnard Leigh in Treatment: 0 Wound Status Wound Number: 1 Primary Lymphedema Etiology: Wound Location: Left, Circumferential Lower Leg Wound Open Wounding Event: Gradually Appeared Status: Date Acquired: 07/05/2022 Comorbid Anemia, Hypertension, Type II Diabetes, Gout, Osteoarthritis, Weeks Of Treatment: 0 History: Received  Radiation Clustered Wound: Yes Photos Wound Measurements Length: (cm) 22 Width: (cm) 23 Depth: (cm) 0.1 Clustered Quantity: 4 Area: (cm) 397.411 Volume: (cm) 39.741 % Reduction in Area: 0% % Reduction in Volume: 0% Epithelialization: None Tunneling: No Undermining: No Wound Description Classification: Full Thickness Without Exposed Support Structures Wound Margin: Distinct, outline attached Exudate Amount: Large Exudate Type: Serosanguineous Exudate Color: red, brown Foul Odor After Cleansing: No Slough/Fibrino Yes Wound Bed Granulation Amount: Large (67-100%) Exposed Structure Granulation Quality: Red, Pink Fascia Exposed: No Necrotic Amount: Small (1-33%) Fat Layer (Subcutaneous Tissue) Exposed: Yes Necrotic Quality: Adherent Slough Tendon Exposed: No Muscle Exposed: No Joint Exposed: No Bone Exposed: No Treatment Notes Wound #1 (Lower Leg) Wound Laterality: Left, Circumferential Cleanser Peri-Wound Care Triamcinolone 15 (g) Discharge Instruction: Use triamcinolone 15 (g) as directed Sween Lotion (Moisturizing lotion) Discharge Instruction: Apply moisturizing lotion as directed Topical Primary  Dressing KerraCel Ag Gelling Fiber Dressing, 4x5 in (silver alginate) Discharge Instruction: Apply silver alginate to wound bed as instructed Secondary Dressing ABD Pad, 8x10 Discharge Instruction: Apply over primary dressing as directed. Secured With Compression Wrap Kerlix Roll 4.5x3.1 (in/yd) Discharge Instruction: Apply Kerlix and Coban compression as directed. Coban Self-Adherent Wrap 4x5 (in/yd) Discharge Instruction: Apply over Kerlix as directed. Compression Stockings Circaid Juxta Lite Compression Wrap Quantity: 1 Left Leg Compression Amount: 30-40 mmHg Discharge Instruction: Apply Circaid Juxta Lite Compression Wrap daily as instructed. Apply first thing in the morning, remove at night before bed. Add-Ons Electronic Signature(s) Signed: 07/19/2022 5:46:02 PM By: [name] RN, BSN Entered By: [name] on 07/19/2022 10:36:28 -------------------------------------------------------------------------------- Wound Assessment Details Patient Name: Date of Service: [name], [name] 07/19/2022 9:45 A M Medical Record Number: [id number] Patient Account Number: [id number] Date of Birth/Sex: Treating RN: 06-25-1950 (72 y.o. [name] Primary Care Mung Rinker: Marlowe Sax Other Clinician: Referring Krissy Orebaugh: Treating Hady Niemczyk/Extender: Burnard Leigh in Treatment: 0 Wound Status Wound Number: 2 Primary Lymphedema Etiology: Wound Location: Right, Circumferential Lower Leg Wound Open Wounding Event: Gradually Appeared Status: Date Acquired: 07/05/2022 Comorbid Anemia, Hypertension, Type II Diabetes, Gout, Osteoarthritis, Weeks Of Treatment: 0 History: Received Radiation Clustered Wound: Yes Photos Wound Measurements Length: (cm) 19 Width: (cm) 26 Depth: (cm) 0.1 Clustered Quantity: 4 Area: (cm) 387.987 Volume: (cm) 38.799 % Reduction in Area: 0% % Reduction in Volume: 0% Epithelialization: None Tunneling: No Undermining: No Wound  Description Classification: Full Thickness Without Exposed Support Structures Wound Margin: Distinct, outline attached Exudate Amount: Large Exudate Type: Serosanguineous Exudate Color: red, brown Foul Odor After Cleansing: No Slough/Fibrino Yes Wound Bed Granulation Amount: Medium (34-66%) Exposed Structure Granulation Quality: Red, Pink Fascia Exposed: No Necrotic Amount: Medium (34-66%) Fat Layer (Subcutaneous Tissue) Exposed: Yes Necrotic Quality: Adherent Slough Tendon Exposed: No Muscle Exposed: No Joint Exposed: No Bone Exposed: No Treatment Notes Wound #2 (Lower Leg) Wound Laterality: Right, Circumferential Cleanser Peri-Wound Care Triamcinolone 15 (g) Discharge Instruction: Use triamcinolone 15 (g) as directed Sween Lotion (Moisturizing lotion) Discharge Instruction: Apply moisturizing lotion as directed Topical Primary Dressing KerraCel Ag Gelling Fiber Dressing, 4x5 in (silver alginate) Discharge Instruction: Apply silver alginate to wound bed as instructed Secondary Dressing ABD Pad, 8x10 Discharge Instruction: Apply over primary dressing as directed. Secured With Compression Wrap Kerlix Roll 4.5x3.1 (in/yd) Discharge Instruction: Apply Kerlix and Coban compression as directed. Coban Self-Adherent Wrap 4x5 (in/yd) Discharge Instruction: Apply over Kerlix as directed. Compression Stockings Circaid Juxta Lite Compression Wrap Quantity: 1 Right Leg Compression Amount: 30-40 mmHg Discharge Instruction: Apply Circaid Juxta Lite Compression Wrap daily as  instructed. Apply first thing in the morning, remove at night before bed. Add-Ons Electronic Signature(s) Signed: 07/19/2022 5:46:02 PM By: [name] RN, BSN Entered By: [name] on 07/19/2022 10:36:44 -------------------------------------------------------------------------------- Vitals Details Patient Name: Date of Service: [name], [name] 07/19/2022 9:45 A M Medical Record Number:  [id number] Patient Account Number: [id number] Date of Birth/Sex: Treating RN: 1950/05/09 (72 y.o. [name] Primary Care Lether Tesch: Marlowe Sax Other Clinician: Referring Connery Shiffler: Treating Taneika Choi/Extender: Burnard Leigh in Treatment: 0 Vital Signs Time Taken: 10:20 Temperature (F): 98.7 Height (in): 64 Pulse (bpm): 77 Source: Stated Respiratory Rate (breaths/min): 20 Weight (lbs): 325 Blood Pressure (mmHg): 145/70 Source: Stated Capillary Blood Glucose (mg/dl): 107 Body Mass Index (BMI): 55.8 Reference Range: 80 - 120 mg / dl Electronic Signature(s) Signed: 07/19/2022 5:46:02 PM By: [name] RN, BSN Entered By: [name] on 07/19/2022 10:32:07

## 2022-07-20 ENCOUNTER — Telehealth: Payer: Self-pay

## 2022-07-20 NOTE — Telephone Encounter (Signed)
error 

## 2022-07-23 ENCOUNTER — Other Ambulatory Visit: Payer: Self-pay | Admitting: Internal Medicine

## 2022-07-23 ENCOUNTER — Ambulatory Visit
Admission: RE | Admit: 2022-07-23 | Discharge: 2022-07-23 | Disposition: A | Discharge status: Home / Self Care | Payer: Medicare Other | Source: Ambulatory Visit | Attending: Internal Medicine | Admitting: Internal Medicine

## 2022-07-23 DIAGNOSIS — R0602 Shortness of breath: Secondary | ICD-10-CM | POA: Diagnosis not present

## 2022-07-23 DIAGNOSIS — R0689 Other abnormalities of breathing: Secondary | ICD-10-CM | POA: Diagnosis not present

## 2022-07-23 DIAGNOSIS — N1831 Chronic kidney disease, stage 3a: Secondary | ICD-10-CM | POA: Diagnosis not present

## 2022-07-23 DIAGNOSIS — R748 Abnormal levels of other serum enzymes: Secondary | ICD-10-CM | POA: Diagnosis not present

## 2022-07-23 DIAGNOSIS — R600 Localized edema: Secondary | ICD-10-CM | POA: Diagnosis not present

## 2022-07-23 DIAGNOSIS — D509 Iron deficiency anemia, unspecified: Secondary | ICD-10-CM | POA: Diagnosis not present

## 2022-07-23 DIAGNOSIS — E1122 Type 2 diabetes mellitus with diabetic chronic kidney disease: Secondary | ICD-10-CM | POA: Diagnosis not present

## 2022-07-23 DIAGNOSIS — R059 Cough, unspecified: Secondary | ICD-10-CM | POA: Diagnosis not present

## 2022-07-23 DIAGNOSIS — Z794 Long term (current) use of insulin: Secondary | ICD-10-CM | POA: Diagnosis not present

## 2022-07-23 LAB — BASIC METABOLIC PANEL
BUN: 19 (ref 4–21)
CO2: 31 — AB (ref 13–22)
Chloride: 107 (ref 99–108)
Creatinine: 0.9 (ref 0.5–1.1)
Glucose: 60
Potassium: 4.9 mEq/L (ref 3.5–5.1)
Sodium: 144 (ref 137–147)

## 2022-07-23 LAB — CBC AND DIFFERENTIAL
HCT: 30 — AB (ref 36–46)
Hemoglobin: 9.8 — AB (ref 12.0–16.0)
Platelets: 332 10*3/uL (ref 150–400)
WBC: 6

## 2022-07-23 LAB — COMPREHENSIVE METABOLIC PANEL
Albumin: 3.6 (ref 3.5–5.0)
Calcium: 9.7 (ref 8.7–10.7)
eGFR: 65

## 2022-07-23 LAB — LIPID PANEL
Cholesterol: 187 (ref 0–200)
HDL: 31 — AB (ref 35–70)
LDL Cholesterol: 124
LDl/HDL Ratio: 6.1
Triglycerides: 177 — AB (ref 40–160)

## 2022-07-23 LAB — HEPATIC FUNCTION PANEL
ALT: 18 U/L (ref 7–35)
AST: 18 (ref 13–35)
Bilirubin, Total: 0.6

## 2022-07-23 LAB — IRON,TIBC AND FERRITIN PANEL: Ferritin: 19.9

## 2022-07-23 LAB — HEMOGLOBIN A1C: Hemoglobin A1C: 5.6

## 2022-07-23 LAB — CBC: RBC: 3.13 — AB (ref 3.87–5.11)

## 2022-07-23 LAB — TSH: TSH: 6.84 — AB (ref 0.41–5.90)

## 2022-07-27 ENCOUNTER — Encounter (HOSPITAL_BASED_OUTPATIENT_CLINIC_OR_DEPARTMENT_OTHER): Payer: Medicare Other | Admitting: Internal Medicine

## 2022-07-27 DIAGNOSIS — L97812 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d: Secondary | ICD-10-CM | POA: Diagnosis not present

## 2022-07-27 DIAGNOSIS — C541 Malignant neoplasm of endometrium: Secondary | ICD-10-CM | POA: Diagnosis not present

## 2022-07-27 DIAGNOSIS — I890 Lymphedema, not elsewhere classified: Secondary | ICD-10-CM | POA: Diagnosis not present

## 2022-07-27 DIAGNOSIS — I87333 Chronic venous hypertension (idiopathic) with ulcer and inflammation of bilateral lower extremity: Secondary | ICD-10-CM | POA: Diagnosis not present

## 2022-07-27 DIAGNOSIS — L97822 Non-pressure chronic ulcer of other part of left lower leg with fat layer exposed: Secondary | ICD-10-CM | POA: Diagnosis not present

## 2022-07-27 DIAGNOSIS — E11622 Type 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: Secondary | ICD-10-CM | POA: Diagnosis not present

## 2022-07-27 NOTE — Progress Notes (Signed)
[name], [name] ([id number]) Visit Report for 07/27/2022 Arrival Information Details Patient Name: Date of Service: [name], [name] 07/27/2022 9:00 [name] Record Number: [id number] Patient Account Number: [id number] Date of Birth/Sex: Treating RN: 1950/07/17 (72 y.o. [name], [name] Primary Care Hannelore Bova: Marlowe Sax Other Clinician: Referring Larance Ratledge: Treating Treniyah Lynn/Extender: [name] Ngetich, Dinah Weeks in Treatment: 1 Visit Information History Since Last Visit Added or deleted any medications: No Patient Arrived: Wheel Chair Any new allergies or adverse reactions: No Arrival Time: 09:10 Had a fall or experienced change in No Accompanied By: self activities of daily living that may affect Transfer Assistance: None risk of falls: Patient Identification Verified: Yes Signs or symptoms of abuse/neglect since last visito No Secondary Verification Process Completed: Yes Hospitalized since last visit: No Patient Requires Transmission-Based Precautions: No Implantable device outside of the clinic excluding No Patient Has Alerts: No cellular tissue based products placed in the center since last visit: Has Dressing in Place as Prescribed: Yes Has Compression in Place as Prescribed: Yes Pain Present Now: No Notes wraps slid down bilaterally. Electronic Signature(s) Signed: 07/27/2022 5:18:09 PM By: [name] RN, BSN Entered By: [name] on 07/27/2022 09:13:52 -------------------------------------------------------------------------------- Encounter Discharge Information Details Patient Name: Date of Service: [name], [name] 07/27/2022 9:00 [place] Record Number: [id number] Patient Account Number: [id number] Date of Birth/Sex: Treating RN: 08/04/1950 (72 y.o. [name] Primary Care Samatha Anspach: Marlowe Sax Other Clinician: Referring Cesiah Westley: Treating Daishon Chui/Extender: [name] Ngetich, Dinah Weeks in Treatment: 1 Encounter Discharge  Information Items Post Procedure Vitals Discharge Condition: Stable Temperature (F): 99.1 Ambulatory Status: Wheelchair Pulse (bpm): 57 Discharge Destination: Home Respiratory Rate (breaths/min): 22 Transportation: Private Auto Blood Pressure (mmHg): 121/72 Accompanied By: self Schedule Follow-up Appointment: Yes Clinical Summary of Care: Electronic Signature(s) Signed: 07/27/2022 5:18:09 PM By: [name] RN, BSN Entered By: [name] on 07/27/2022 09:54:45 -------------------------------------------------------------------------------- Lower Extremity Assessment Details Patient Name: Date of Service: [name], [name] 07/27/2022 9:00 [place] Record Number: [id number] Patient Account Number: [id number] Date of Birth/Sex: Treating RN: Apr 22, 1950 (72 y.o. [name] Primary Care Daxson Reffett: Marlowe Sax Other Clinician: Referring Aniketh Huberty: Treating Jaquille Kau/Extender: [name] Ngetich, Dinah Weeks in Treatment: 1 Edema Assessment Assessed: [Left: Yes] [Right: Yes] Edema: [Left: Yes] [Right: Yes] Calf Left: Right: Point of Measurement: 39 cm From Medial Instep 55 cm 57 cm Ankle Left: Right: Point of Measurement: 10 cm From Medial Instep 30.5 cm 30 cm Vascular Assessment Pulses: Dorsalis Pedis Palpable: [Left:Yes] [Right:Yes] Electronic Signature(s) Signed: 07/27/2022 5:18:09 PM By: [name] RN, BSN Entered By: [name] on 07/27/2022 09:26:33 -------------------------------------------------------------------------------- Multi Wound Chart Details Patient Name: Date of Service: [name], [name] 07/27/2022 9:00 [name] Record Number: [id number] Patient Account Number: [id number] Date of Birth/Sex: Treating RN: [birth year] (72 y.o. [name], [name] Primary Care Yesennia Hirota: Marlowe Sax Other Clinician: Referring Aashritha Miedema: Treating Esaias Cleavenger/Extender: [name] Ngetich, Dinah Weeks in Treatment: 1 Vital Signs Height(in):  64 Capillary Blood Glucose(mg/dl): 120 Weight(lbs): 325 Pulse(bpm): 71 Body Mass Index(BMI): 55.8 Blood Pressure(mmHg): 121/72 Temperature(F): 99.1 Respiratory Rate(breaths/min): 22 Photos: [1:Left, Circumferential Lower Leg] [2:Right, Circumferential Lower Leg] [[name]:[name] [name]] Wound Location: [1:Gradually Appeared] [2:Gradually Appeared] [[name]:[name]] Wounding Event: [1:Lymphedema] [2:Lymphedema] [[name]:[name]] Primary Etiology: [1:Anemia, Hypertension, Type II] [2:Anemia, Hypertension, Type II] [[name]:[name]] Comorbid History: [1:Diabetes, Gout, Osteoarthritis, Received Radiation 07/05/2022] [2:Diabetes, Gout, Osteoarthritis, Received Radiation 07/05/2022] [[name]:[name]] Date Acquired: [1:1] [2:1] [[name]:[name]] Weeks of Treatment: [1:Open] [2:Open] [[name]:[name]] Wound Status: [1:No] [2:No] [[name]:[name]] Wound Recurrence: [1:Yes] [2:Yes] [[name]:[name]] Clustered Wound: [1:5] [  2:4] [[name]:[name]] Clustered Quantity: [1:11.5x25x0.1] [2:13x28x0.1] [[name]:[name]] Measurements L x W x D (cm) [1:225.802] [2:285.885] [[name]:[name]] A (cm) : rea [1:22.58] [8:18.563] [[name]:[name]] Volume (cm) : [1:43.20%] [2:26.30%] [[name]:[name]] % Reduction in A [1:rea: 43.20%] [2:26.30%] [[name]:[name]] % Reduction in Volume: [1:Full Thickness Without Exposed] [2:Full Thickness Without Exposed] [[name]:[name]] Classification: [1:Support Structures Medium] [2:Support Structures Medium] [[name]:[name]] Exudate A mount: [1:Serosanguineous] [2:Serosanguineous] [[name]:[name]] Exudate Type: [1:red, brown] [2:red, brown] [[name]:[name]] Exudate Color: [1:Distinct, outline attached] [2:Distinct, outline attached] [[name]:[name]] Wound Margin: [1:Large (67-100%)] [2:Large (67-100%)] [[name]:[name]] Granulation A mount: [1:Red, Pink] [2:Red, Pink] [[name]:[name]] Granulation Quality: [1:Small (1-33%)] [2:Small (1-33%)] [[name]:[name]] Necrotic A mount: [1:Fat Layer (Subcutaneous Tissue): Yes Fat Layer (Subcutaneous Tissue): Yes [name]] Exposed Structures: [1:Fascia: No Tendon: No Muscle: No Joint: No Bone: No Small (1-33%)]  [2:Fascia: No Tendon: No Muscle: No Joint: No Bone: No Small (1-33%)] [[name]:[name]] Epithelialization: [1:Debridement - Excisional] [2:Debridement - Excisional] [[name]:[name]] Debridement: Pre-procedure Verification/Time Out 09:45 [2:09:45] [[name]:[name]] Taken: [1:Lidocaine 4% Topical Solution] [2:Lidocaine 4% Topical Solution] [[name]:[name]] Pain Control: [1:Subcutaneous, Slough] [2:Subcutaneous, Slough] [[name]:[name]] Tissue Debrided: [1:Skin/Subcutaneous Tissue] [2:Skin/Subcutaneous Tissue] [[name]:[name]] Level: [1:4] [2:4] [[name]:[name]] Debridement A (sq cm): [1:rea Curette] [2:Curette] [[name]:[name]] Instrument: [1:Minimum] [2:Minimum] [[name]:[name]] Bleeding: [1:Pressure] [2:Pressure] [[name]:[name]] Hemostasis A chieved: [1:0] [2:0] [[name]:[name]] Procedural Pain: [1:0] [2:0] [[name]:[name]] Post Procedural Pain: [1:Procedure was tolerated well] [2:Procedure was tolerated well] [[name]:[name]] Debridement Treatment Response: [1:11.5x25x0.1] [2:13x28x0.1] [[name]:[name]] Post Debridement Measurements L x W x D (cm) [1:22.58] [2:28.588] [[name]:[name]] Post Debridement Volume: (cm) [1:Debridement] [2:Debridement] [[name]:[name]] Treatment Notes Wound #1 (Lower Leg) Wound Laterality: Left, Circumferential Cleanser Peri-Wound Care Triamcinolone 15 (g) Discharge Instruction: Use triamcinolone 15 (g) as directed Sween Lotion (Moisturizing lotion) Discharge Instruction: Apply moisturizing lotion as directed Topical Primary Dressing KerraCel Ag Gelling Fiber Dressing, 4x5 in (silver alginate) Discharge Instruction: Apply silver alginate to wound bed as instructed Secondary Dressing ABD Pad, 8x10 Discharge Instruction: Apply over primary dressing as directed. Secured With Compression Wrap Kerlix Roll 4.5x3.1 (in/yd) Discharge Instruction: Apply Kerlix and Coban compression as directed. Coban Self-Adherent Wrap 4x5 (in/yd) Discharge Instruction: Apply over Kerlix as directed. unna boot first layer Discharge Instruction: apply unna boot first layer at upper  portion of lower leg to secure wrap in place. Compression Stockings Add-Ons Wound #2 (Lower Leg) Wound Laterality: Right, Circumferential Cleanser Peri-Wound Care Triamcinolone 15 (g) Discharge Instruction: Use triamcinolone 15 (g) as directed Sween Lotion (Moisturizing lotion) Discharge Instruction: Apply moisturizing lotion as directed Topical Primary Dressing KerraCel Ag Gelling Fiber Dressing, 4x5 in (silver alginate) Discharge Instruction: Apply silver alginate to wound bed as instructed Secondary Dressing ABD Pad, 8x10 Discharge Instruction: Apply over primary dressing as directed. Secured With Compression Wrap Kerlix Roll 4.5x3.1 (in/yd) Discharge Instruction: Apply Kerlix and Coban compression as directed. Coban Self-Adherent Wrap 4x5 (in/yd) Discharge Instruction: Apply over Kerlix as directed. unna boot first layer Discharge Instruction: apply unna boot first layer at upper portion of lower leg to secure wrap in place. Compression Stockings Add-Ons Electronic Signature(s) Signed: 07/27/2022 11:50:19 AM By: [name] DO Signed: 07/27/2022 5:18:09 PM By: [name] RN, BSN Entered By: [name] on 07/27/2022 10:13:32 -------------------------------------------------------------------------------- [place] Details Patient Name: Date of Service: [place], [name] 07/27/2022 9:00 [place] Medical Record Number: [id number] Patient Account Number: [id number] Date of Birth/Sex: Treating RN: Sep 15, 1950 (72 y.o. [name] Primary Care Carrington Mullenax: Marlowe Sax Other Clinician: Referring Jamicia Haaland: Treating Caylan Schifano/Extender: [name] Ngetich, Dinah Weeks in Treatment: 1 Active Inactive Pain, Acute or Chronic Nursing Diagnoses: Pain, acute or chronic: actual or potential Potential alteration in comfort,  pain Goals: Patient will verbalize adequate pain control and receive pain control interventions during procedures as  needed Date Initiated: 07/19/2022 Target Resolution Date: 08/03/2022 Goal Status: Active Patient/caregiver will verbalize comfort level met Date Initiated: 07/19/2022 Target Resolution Date: 08/02/2022 Goal Status: Active Interventions: Encourage patient to take pain medications as prescribed Provide education on pain management Reposition patient for comfort Treatment Activities: Administer pain control measures as ordered : 07/19/2022 Notes: Tissue Oxygenation Nursing Diagnoses: Knowledge deficit related to disease process and management Goals: Non-invasive arterial studies are completed as ordered Date Initiated: 07/19/2022 Target Resolution Date: 08/03/2022 Goal Status: Active Interventions: Assess patient understanding of disease process and management upon diagnosis and as needed Provide education on tissue oxygenation and ischemia Treatment Activities: Ankle Brachial Index (ABI) : 07/19/2022 Notes: Venous Leg Ulcer Nursing Diagnoses: Actual venous Insuffiency (use after diagnosis is confirmed) Potential for venous Insuffiency (use before diagnosis confirmed) Goals: Non-invasive venous studies are completed as ordered Date Initiated: 07/19/2022 Target Resolution Date: 08/03/2022 Goal Status: Active Interventions: Assess peripheral edema status every visit. Compression as ordered Provide education on venous insufficiency Treatment Activities: Non-invasive vascular studies : 07/19/2022 Notes: Wound/Skin Impairment Nursing Diagnoses: Knowledge deficit related to ulceration/compromised skin integrity Goals: Patient/caregiver will verbalize understanding of skin care regimen Date Initiated: 07/19/2022 Target Resolution Date: 08/02/2022 Goal Status: Active Interventions: Assess patient/caregiver ability to perform ulcer/skin care regimen upon admission and as needed Assess ulceration(s) every visit Provide education on ulcer and skin care Treatment Activities: Skin care  regimen initiated : 07/19/2022 Topical wound management initiated : 07/19/2022 Notes: Electronic Signature(s) Signed: 07/27/2022 5:18:09 PM By: [name] RN, BSN Entered By: [name] on 07/27/2022 09:39:35 -------------------------------------------------------------------------------- Pain Assessment Details Patient Name: Date of Service: [name], [place] RIA [name] 07/27/2022 9:00 [place] Record Number: [id number] Patient Account Number: [id number] Date of Birth/Sex: Treating RN: 1950/05/20 (72 y.o. [name] Primary Care Sande Pickert: Marlowe Sax Other Clinician: Referring Lucresia Simic: Treating Carol Loftin/Extender: [name] Ngetich, Dinah Weeks in Treatment: 1 Active Problems Location of Pain Severity and Description of Pain Patient Has Paino No Site Locations Rate the pain. Current Pain Level: 0 Pain Management and Medication Current Pain Management: Medication: No Cold Application: No Rest: No Massage: No Activity: No T.E.[name].S.: No Heat Application: No Leg drop or elevation: No Is the Current Pain Management Adequate: Adequate How does your wound impact your activities of daily livingo Sleep: No Bathing: No Appetite: No Relationship With Others: No Bladder Continence: No Emotions: No Bowel Continence: No Work: No Toileting: No Drive: No Dressing: No Hobbies: No Engineer, maintenance) Signed: 07/27/2022 5:18:09 PM By: [name] RN, BSN Entered By: [name] on 07/27/2022 09:14:04 -------------------------------------------------------------------------------- Patient/Caregiver Education Details Patient Name: Date of Service: [name], [name] 7/28/2023andnbsp9:00 [place] Record Number: [id number] Patient Account Number: [id number] Date of Birth/Gender: Treating RN: [birth year] (72 y.o. [name] Primary Care Physician: Marlowe Sax Other Clinician: Referring Physician: Treating Physician/Extender: Elsworth Soho in Treatment: 1 Education Assessment Education Provided To: Patient Education Topics Provided Tissue Oxygenation: Handouts: Peripheral Arterial Disease and Related Ulcers Methods: Explain/Verbal Responses: Reinforcements needed Venous: Handouts: Controlling Swelling with Compression Stockings , Controlling Swelling with Multilayered Compression Wraps, Managing Venous Disease and Related Ulcers Methods: Explain/Verbal Responses: Reinforcements needed Electronic Signature(s) Signed: 07/27/2022 5:18:09 PM By: [name] RN, BSN Entered By: [name] on 07/27/2022 09:39:51 -------------------------------------------------------------------------------- Wound Assessment Details Patient Name: Date of Service: [name], [name] 07/27/2022 9:00 [place] Record Number: [id number] Patient Account Number: [id number] Date  of Birth/Sex: Treating RN: 01-Dec-1950 (72 y.o. [name] Primary Care Alexys Lobello: Marlowe Sax Other Clinician: Referring Patrcia Schnepp: Treating Navdeep Halt/Extender: [name] Ngetich, Dinah Weeks in Treatment: 1 Wound Status Wound Number: 1 Primary Lymphedema Etiology: Wound Location: Left, Circumferential Lower Leg Wound Open Wounding Event: Gradually Appeared Status: Date Acquired: 07/05/2022 Comorbid Anemia, Hypertension, Type II Diabetes, Gout, Osteoarthritis, Weeks Of Treatment: 1 History: Received Radiation Clustered Wound: Yes Photos Wound Measurements Length: (cm) 11.5 Width: (cm) 25 Depth: (cm) 0.1 Clustered Quantity: 5 Area: (cm) 225.802 Volume: (cm) 22.58 % Reduction in Area: 43.2% % Reduction in Volume: 43.2% Epithelialization: Small (1-33%) Tunneling: No Undermining: No Wound Description Classification: Full Thickness Without Exposed Support Structures Wound Margin: Distinct, outline attached Exudate Amount: Medium Exudate Type: Serosanguineous Exudate Color: red, brown Foul Odor After Cleansing:  No Slough/Fibrino Yes Wound Bed Granulation Amount: Large (67-100%) Exposed Structure Granulation Quality: Red, Pink Fascia Exposed: No Necrotic Amount: Small (1-33%) Fat Layer (Subcutaneous Tissue) Exposed: Yes Necrotic Quality: Adherent Slough Tendon Exposed: No Muscle Exposed: No Joint Exposed: No Bone Exposed: No Treatment Notes Wound #1 (Lower Leg) Wound Laterality: Left, Circumferential Cleanser Peri-Wound Care Triamcinolone 15 (g) Discharge Instruction: Use triamcinolone 15 (g) as directed Sween Lotion (Moisturizing lotion) Discharge Instruction: Apply moisturizing lotion as directed Topical Primary Dressing KerraCel Ag Gelling Fiber Dressing, 4x5 in (silver alginate) Discharge Instruction: Apply silver alginate to wound bed as instructed Secondary Dressing ABD Pad, 8x10 Discharge Instruction: Apply over primary dressing as directed. Secured With Compression Wrap Kerlix Roll 4.5x3.1 (in/yd) Discharge Instruction: Apply Kerlix and Coban compression as directed. Coban Self-Adherent Wrap 4x5 (in/yd) Discharge Instruction: Apply over Kerlix as directed. unna boot first layer Discharge Instruction: apply unna boot first layer at upper portion of lower leg to secure wrap in place. Compression Stockings Add-Ons Electronic Signature(s) Signed: 07/27/2022 5:18:09 PM By: [name] RN, BSN Entered By: [name] on 07/27/2022 09:25:11 -------------------------------------------------------------------------------- Wound Assessment Details Patient Name: Date of Service: [name], [name] 07/27/2022 9:00 [place] Record Number: [id number] Patient Account Number: [id number] Date of Birth/Sex: Treating RN: [birth year] (72 y.o. [name], [name] Primary Care Haydan Wedig: Marlowe Sax Other Clinician: Referring Javonne Dorko: Treating Jhamal Plucinski/Extender: [name] Ngetich, Dinah Weeks in Treatment: 1 Wound Status Wound Number: 2 Primary Lymphedema Etiology: Wound  Location: Right, Circumferential Lower Leg Wound Open Wounding Event: Gradually Appeared Status: Date Acquired: 07/05/2022 Comorbid Anemia, Hypertension, Type II Diabetes, Gout, Osteoarthritis, Weeks Of Treatment: 1 History: Received Radiation Clustered Wound: Yes Photos Wound Measurements Length: (cm) 13 Width: (cm) 28 Depth: (cm) 0.1 Clustered Quantity: 4 Area: (cm) 285.885 Volume: (cm) 28.588 % Reduction in Area: 26.3% % Reduction in Volume: 26.3% Epithelialization: Small (1-33%) Tunneling: No Undermining: No Wound Description Classification: Full Thickness Without Exposed Support Structures Wound Margin: Distinct, outline attached Exudate Amount: Medium Exudate Type: Serosanguineous Exudate Color: red, brown Foul Odor After Cleansing: No Slough/Fibrino Yes Wound Bed Granulation Amount: Large (67-100%) Exposed Structure Granulation Quality: Red, Pink Fascia Exposed: No Necrotic Amount: Small (1-33%) Fat Layer (Subcutaneous Tissue) Exposed: Yes Necrotic Quality: Adherent Slough Tendon Exposed: No Muscle Exposed: No Joint Exposed: No Bone Exposed: No Treatment Notes Wound #2 (Lower Leg) Wound Laterality: Right, Circumferential Cleanser Peri-Wound Care Triamcinolone 15 (g) Discharge Instruction: Use triamcinolone 15 (g) as directed Sween Lotion (Moisturizing lotion) Discharge Instruction: Apply moisturizing lotion as directed Topical Primary Dressing KerraCel Ag Gelling Fiber Dressing, 4x5 in (silver alginate) Discharge Instruction: Apply silver alginate to wound bed as instructed Secondary Dressing ABD Pad, 8x10 Discharge Instruction: Apply over primary dressing  as directed. Secured With Compression Wrap Kerlix Roll 4.5x3.1 (in/yd) Discharge Instruction: Apply Kerlix and Coban compression as directed. Coban Self-Adherent Wrap 4x5 (in/yd) Discharge Instruction: Apply over Kerlix as directed. unna boot first layer Discharge Instruction: apply unna boot  first layer at upper portion of lower leg to secure wrap in place. Compression Stockings Add-Ons Electronic Signature(s) Signed: 07/27/2022 5:18:09 PM By: [name] RN, BSN Entered By: [name] on 07/27/2022 09:25:43 -------------------------------------------------------------------------------- Vitals Details Patient Name: Date of Service: [name], [name] 07/27/2022 9:00 [name] Record Number: [id number] Patient Account Number: [id number] Date of Birth/Sex: Treating RN: [birth year] (72 y.o. [name] Primary Care Jenniferlynn Saad: Marlowe Sax Other Clinician: Referring Maynard David: Treating Keivon Garden/Extender: [name] Ngetich, Dinah Weeks in Treatment: 1 Vital Signs Time Taken: 09:25 Temperature (F): 99.1 Height (in): 64 Pulse (bpm): 87 Weight (lbs): 325 Respiratory Rate (breaths/min): 22 Body Mass Index (BMI): 55.8 Blood Pressure (mmHg): 121/72 Capillary Blood Glucose (mg/dl): 120 Reference Range: 80 - 120 mg / dl Notes per patient seen an endocrinologist who noted HgbA1c stable and did a chest x-ray concerned for CHF. Per patient has a new PCP appt Monday 07/30/2022. Electronic Signature(s) Signed: 07/27/2022 5:18:09 PM By: [name] RN, BSN Entered By: [name] on 07/27/2022 09:54:40

## 2022-07-27 NOTE — Progress Notes (Signed)
[name], [name] ([id number]) Visit Report for 07/27/2022 Chief Complaint Document Details Patient Name: Date of Service: [place], [name] 07/27/2022 9:00 [name] Record Number: [id number] Patient Account Number: [id number] Date of Birth/Sex: Treating RN: 1950/10/09 (72 y.o. [name] Primary Care Provider: Marlowe Sax Other Clinician: Referring Provider: Treating Provider/Extender: [name] Ngetich, Dinah Weeks in Treatment: 1 Information Obtained from: Patient Chief Complaint 07/19/2022; bilateral lower extremity wounds Electronic Signature(s) Signed: 07/27/2022 11:50:19 AM By: [name] DO Entered By: [name] on 07/27/2022 10:13:39 -------------------------------------------------------------------------------- Debridement Details Patient Name: Date of Service: [name], [place] 07/27/2022 9:00 [place] Record Number: [id number] Patient Account Number: [id number] Date of Birth/Sex: Treating RN: [birth year] (72 y.o. [name], Tammi Klippel Primary Care Provider: Marlowe Sax Other Clinician: Referring Provider: Treating Provider/Extender: [name] Ngetich, Dinah Weeks in Treatment: 1 Debridement Performed for Assessment: Wound #1 Left,Circumferential Lower Leg Performed By: Physician [name], DO Debridement Type: Debridement Level of Consciousness (Pre-procedure): Awake and Alert Pre-procedure Verification/Time Out Yes - 09:45 Taken: Start Time: 09:46 Pain Control: Lidocaine 4% T opical Solution T Area Debrided (L x W): otal 2 (cm) x 2 (cm) = 4 (cm) Tissue and other material debrided: Viable, Non-Viable, Slough, Subcutaneous, Slough Level: Skin/Subcutaneous Tissue Debridement Description: Excisional Instrument: Curette Bleeding: Minimum Hemostasis Achieved: Pressure End Time: 09:53 Procedural Pain: 0 Post Procedural Pain: 0 Response to Treatment: Procedure was tolerated well Level of Consciousness (Post- Awake and  Alert procedure): Post Debridement Measurements of Total Wound Length: (cm) 11.5 Width: (cm) 25 Depth: (cm) 0.1 Volume: (cm) 22.58 Character of Wound/Ulcer Post Debridement: Improved Post Procedure Diagnosis Same as Pre-procedure Electronic Signature(s) Signed: 07/27/2022 11:50:19 AM By: [name] DO Signed: 07/27/2022 5:18:09 PM By: [name] RN, BSN Entered By: [name] on 07/27/2022 09:53:21 -------------------------------------------------------------------------------- Debridement Details Patient Name: Date of Service: [name] [place], [place] RIA N 07/27/2022 9:00 A M Medical Record Number: [id number] Patient Account Number: [id number] Date of Birth/Sex: Treating RN: [birth year] (72 y.o. [name] Primary Care Provider: Marlowe Sax Other Clinician: Referring Provider: Treating Provider/Extender: [name] Ngetich, Dinah Weeks in Treatment: 1 Debridement Performed for Assessment: Wound #2 Right,Circumferential Lower Leg Performed By: Physician [name], DO Debridement Type: Debridement Level of Consciousness (Pre-procedure): Awake and Alert Pre-procedure Verification/Time Out Yes - 09:45 Taken: Start Time: 09:46 Pain Control: Lidocaine 4% T opical Solution T Area Debrided (L x W): otal 2 (cm) x 2 (cm) = 4 (cm) Tissue and other material debrided: Viable, Non-Viable, Slough, Subcutaneous, Slough Level: Skin/Subcutaneous Tissue Debridement Description: Excisional Instrument: Curette Bleeding: Minimum Hemostasis Achieved: Pressure End Time: 09:53 Procedural Pain: 0 Post Procedural Pain: 0 Response to Treatment: Procedure was tolerated well Level of Consciousness (Post- Awake and Alert procedure): Post Debridement Measurements of Total Wound Length: (cm) 13 Width: (cm) 28 Depth: (cm) 0.1 Volume: (cm) 28.588 Character of Wound/Ulcer Post Debridement: Improved Post Procedure Diagnosis Same as Pre-procedure Electronic Signature(s) Signed:  07/27/2022 11:50:19 AM By: [name] DO Signed: 07/27/2022 5:18:09 PM By: [name] RN, BSN Entered By: [name] on 07/27/2022 09:53:35 -------------------------------------------------------------------------------- HPI Details Patient Name: Date of Service: [name] [place], [place] RIA N 07/27/2022 9:00 A M Medical Record Number: [id number] Patient Account Number: [id number] Date of Birth/Sex: Treating RN: 10/19/1950 (72 y.o. [name] Primary Care Provider: Marlowe Sax Other Clinician: Referring Provider: Treating Provider/Extender: [name] Ngetich, Dinah Weeks in Treatment: 1 History of Present Illness HPI Description: Admission 07/19/2022 [name] Is a 72 year old female with a past medical history of endometrial cancer, lymphedema,  insulin-dependent type 2 diabetes that presents to the clinic for a 4-week history of weeping to her lower extremities bilaterally. She visited the ED for this issue on 07/07/2022 and she was prescribed doxycycline For bilateral lower extremity cellulitis. She has completed this course. She currently denies signs of infection. She does not have compression stockings. She states she recently gained weight over the past couple months. She has never had wounds before. 7/28; patient presents for follow-up. We have been using silver alginate under Kerlix/Coban. She is scheduled to get her ABIs with TBI's on 8/7. She states that the wraps slid down to her mid shin. She denies signs of infection. Electronic Signature(s) Signed: 07/27/2022 11:50:19 AM By: [name] DO Entered By: [name] on 07/27/2022 10:14:16 -------------------------------------------------------------------------------- Physical Exam Details Patient Name: Date of Service: [name] [place], [place] RIA N 07/27/2022 9:00 A M Medical Record Number: [id number] Patient Account Number: [id number] Date of Birth/Sex: Treating RN: Aug 15, 1950 (72 y.o. [name] Primary Care  Provider: Marlowe Sax Other Clinician: Referring Provider: Treating Provider/Extender: [name] Ngetich, Dinah Weeks in Treatment: 1 Constitutional respirations regular, non-labored and within target range for patient.. Cardiovascular 2+ dorsalis pedis/posterior tibialis pulses. Psychiatric pleasant and cooperative. Notes Scattered wounds to the lower extremities bilaterally. No weeping noted. Nonviable tissue and some of the wound beds. 2+ pitting edema to the thigh. Lymphedema skin changes. Electronic Signature(s) Signed: 07/27/2022 11:50:19 AM By: [name] DO Entered By: [name] on 07/27/2022 10:15:10 -------------------------------------------------------------------------------- Physician Orders Details Patient Name: Date of Service: [name], [name] RIA N 07/27/2022 9:00 A M Medical Record Number: [id number] Patient Account Number: [id number] Date of Birth/Sex: Treating RN: 10-17-1950 (72 y.o. [name] Primary Care Provider: Marlowe Sax Other Clinician: Referring Provider: Treating Provider/Extender: Grier Rocher, Dinah Weeks in Treatment: 1 Verbal / Phone Orders: No Diagnosis Coding ICD-10 Coding Code Description [phone] 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d L97.822 Non-pressure chronic ulcer of other part of left lower leg with fat layer exposed I89.0 Lymphedema, not elsewhere classified I87.333 Chronic venous hypertension (idiopathic) with ulcer and inflammation of bilateral lower extremity E11.622 Type 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 Follow-up Appointments ppointment in 2 weeks. - Dr. Heber [place] and [place], Room 8 08/06/2022 130pm Monday- patient to come here after appt with arterial studies. Return A Nurse Visit: - Nurse visit Wednesday 08/01/2022 1045 overflow room 6 Other: - Dr. Gwenlyn Found office to perform your arterial studies on 08/06/2022  1000. Bathing/ Shower/ Hygiene May shower with protection but do not get wound dressing(s) wet. Edema Control - Lymphedema / SCD / Other Elevate legs to the level of the heart or above for 30 minutes daily and/or when sitting, a frequency of: - 3-4 times a day throughout the day. Avoid standing for long periods of time. Exercise regularly Wound Treatment Wound #1 - Lower Leg Wound Laterality: Left, Circumferential Peri-Wound Care: Triamcinolone 15 (g) 1 x Per Week/30 Days Discharge Instructions: Use triamcinolone 15 (g) as directed Peri-Wound Care: Sween Lotion (Moisturizing lotion) 1 x Per Week/30 Days Discharge Instructions: Apply moisturizing lotion as directed Prim Dressing: KerraCel Ag Gelling Fiber Dressing, 4x5 in (silver alginate) 1 x Per Week/30 Days ary Discharge Instructions: Apply silver alginate to wound bed as instructed Secondary Dressing: ABD Pad, 8x10 1 x Per Week/30 Days Discharge Instructions: Apply over primary dressing as directed. Compression Wrap: Kerlix Roll 4.5x3.1 (in/yd) 1 x Per Week/30 Days Discharge Instructions: Apply Kerlix and Coban compression  as directed. Compression Wrap: Coban Self-Adherent Wrap 4x5 (in/yd) 1 x Per Week/30 Days Discharge Instructions: Apply over Kerlix as directed. Compression Wrap: unna boot first layer 1 x Per Week/30 Days Discharge Instructions: apply unna boot first layer at upper portion of lower leg to secure wrap in place. Wound #2 - Lower Leg Wound Laterality: Right, Circumferential Peri-Wound Care: Triamcinolone 15 (g) 1 x Per Week/30 Days Discharge Instructions: Use triamcinolone 15 (g) as directed Peri-Wound Care: Sween Lotion (Moisturizing lotion) 1 x Per Week/30 Days Discharge Instructions: Apply moisturizing lotion as directed Prim Dressing: KerraCel Ag Gelling Fiber Dressing, 4x5 in (silver alginate) 1 x Per Week/30 Days ary Discharge Instructions: Apply silver alginate to wound bed as instructed Secondary Dressing:  ABD Pad, 8x10 1 x Per Week/30 Days Discharge Instructions: Apply over primary dressing as directed. Compression Wrap: Kerlix Roll 4.5x3.1 (in/yd) 1 x Per Week/30 Days Discharge Instructions: Apply Kerlix and Coban compression as directed. Compression Wrap: Coban Self-Adherent Wrap 4x5 (in/yd) 1 x Per Week/30 Days Discharge Instructions: Apply over Kerlix as directed. Compression Wrap: unna boot first layer 1 x Per Week/30 Days Discharge Instructions: apply unna boot first layer at upper portion of lower leg to secure wrap in place. Electronic Signature(s) Signed: 07/27/2022 11:50:19 AM By: [name] DO Entered By: [name] on 07/27/2022 10:15:18 -------------------------------------------------------------------------------- Problem List Details Patient Name: Date of Service: [name], [name] RIA N 07/27/2022 9:00 A M Medical Record Number: [id number] Patient Account Number: [id number] Date of Birth/Sex: Treating RN: [birth year] (72 y.o. [name], Tammi Klippel Primary Care Provider: Marlowe Sax Other Clinician: Referring Provider: Treating Provider/Extender: Grier Rocher, Dinah Weeks in Treatment: 1 Active Problems ICD-10 Encounter Code Description Active Date MDM Diagnosis L97.812 Non-pressure chronic ulcer of other part of right lower leg with fat layer 07/19/2022 No Yes exposed L97.822 Non-pressure chronic ulcer of other part of left lower leg with fat layer exposed7/20/2023 No Yes I89.0 Lymphedema, not elsewhere classified 07/19/2022 No Yes I87.333 Chronic venous hypertension (idiopathic) with ulcer and inflammation of 07/19/2022 No Yes bilateral lower extremity E11.622 Type 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 07/19/2022 No Yes 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 07/19/2022 No Yes Inactive Problems Resolved Problems Electronic Signature(s) Signed: 07/27/2022 11:50:19 AM By: [name] DO Entered By: [name] on 07/27/2022  10:12:35 -------------------------------------------------------------------------------- Progress Note Details Patient Name: Date of Service: [name] [place], [place] RIA N 07/27/2022 9:00 A M Medical Record Number: [id number] Patient Account Number: [id number] Date of Birth/Sex: Treating RN: [birth year] (72 y.o. [name] Primary Care Provider: Marlowe Sax Other Clinician: Referring Provider: Treating Provider/Extender: Grier Rocher, Dinah Weeks in Treatment: 1 Subjective Chief Complaint Information obtained from Patient 07/19/2022; bilateral lower extremity wounds History of Present Illness (HPI) Admission 07/19/2022 Ms. Nelwyn Hebdon Is a 72 year old female with a past medical history of endometrial cancer, lymphedema, insulin-dependent type 2 diabetes that presents to the clinic for a 4-week history of weeping to her lower extremities bilaterally. She visited the ED for this issue on 07/07/2022 and she was prescribed doxycycline For bilateral lower extremity cellulitis. She has completed this course. She currently denies signs of infection. She does not have compression stockings. She states she recently gained weight over the past couple months. She has never had wounds before. 7/28; patient presents for follow-up. We have been using silver alginate under Kerlix/Coban. She is scheduled to get her ABIs with TBI's on 8/7. She states that the wraps slid down to her mid shin. She denies signs of infection. Patient History Information obtained from  Patient, Chart. Family History Cancer - Father, Diabetes - Siblings. Social History Former smoker - quit 1988, Marital Status - Married, Alcohol Use - Moderate - cocktails x2 a week, Drug Use - No History, Caffeine Use - Never. Medical History Hematologic/Lymphatic Patient has history of Anemia - iron Cardiovascular Patient has history of Hypertension Endocrine Patient has history of Type II Diabetes Musculoskeletal Patient has  history of Gout, Osteoarthritis Oncologic Patient has history of Received Radiation - 2023 Hospitalization/Surgery History - one ovary removed 2020. - pilonidal cyst extraction 1969. - diverculitis temp colostomy. Medical A Surgical History Notes nd Cardiovascular hyperlipidemia Gastrointestinal GERD Diverticulitis Musculoskeletal carpel tunnel bilateral-surgery to right hand Oncologic endometrial cancer skin Ca Objective Constitutional respirations regular, non-labored and within target range for patient.. Vitals Time Taken: 9:25 AM, Height: 64 in, Weight: 325 lbs, BMI: 55.8, Temperature: 99.1 F, Pulse: 87 bpm, Respiratory Rate: 22 breaths/min, Blood Pressure: 121/72 mmHg, Capillary Blood Glucose: 120 mg/dl. General Notes: per patient seen an endocrinologist who noted HgbA1c stable and did a chest x-ray concerned for CHF. Per patient has a new PCP appt Monday 07/30/2022. Cardiovascular 2+ dorsalis pedis/posterior tibialis pulses. Psychiatric pleasant and cooperative. General Notes: Scattered wounds to the lower extremities bilaterally. No weeping noted. Nonviable tissue and some of the wound beds. 2+ pitting edema to the thigh. Lymphedema skin changes. Integumentary (Hair, Skin) Wound #1 status is Open. Original cause of wound was Gradually Appeared. The date acquired was: 07/05/2022. The wound has been in treatment 1 weeks. The wound is located on the Left,Circumferential Lower Leg. The wound measures 11.5cm length x 25cm width x 0.1cm depth; 225.802cm^2 area and 22.58cm^3 volume. There is Fat Layer (Subcutaneous Tissue) exposed. There is no tunneling or undermining noted. There is a medium amount of serosanguineous drainage noted. The wound margin is distinct with the outline attached to the wound base. There is large (67-100%) red, pink granulation within the wound bed. There is a small (1-33%) amount of necrotic tissue within the wound bed including Adherent Slough. Wound #2  status is Open. Original cause of wound was Gradually Appeared. The date acquired was: 07/05/2022. The wound has been in treatment 1 weeks. The wound is located on the Right,Circumferential Lower Leg. The wound measures 13cm length x 28cm width x 0.1cm depth; 285.885cm^2 area and 28.588cm^3 volume. There is Fat Layer (Subcutaneous Tissue) exposed. There is no tunneling or undermining noted. There is a medium amount of serosanguineous drainage noted. The wound margin is distinct with the outline attached to the wound base. There is large (67-100%) red, pink granulation within the wound bed. There is a small (1-33%) amount of necrotic tissue within the wound bed including Adherent Slough. Assessment Active Problems ICD-10 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d Non-pressure chronic ulcer of other part of left lower leg with fat layer exposed Lymphedema, not elsewhere classified Chronic venous hypertension (idiopathic) with ulcer and inflammation of bilateral lower extremity Type 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 Malignant neoplasm of endometrium Patient's wounds have shown improvement in size appearance since last clinic visit. I debrided nonviable tissue. I recommended continuing the course of silver alginate under Kerlix/Coban. We will add an Unna boot strip at the top to help keep these in place. She is scheduled for ABIs on 8/7. She received her juxta lite compression in the mail. Follow-up in 1 week. Procedures Wound #1 Pre-procedure diagnosis of Wound #1 is a Lymphedema located on the Left,Circumferential Lower Leg . There  was a Excisional Skin/Subcutaneous Tissue Debridement with a total area of 4 sq cm performed by [name], DO. With the following instrument(s): Curette to remove Viable and Non-Viable tissue/material. Material removed includes Subcutaneous Tissue and Slough and after achieving pain control using Lidocaine 4% T opical  Solution. A time out was conducted at 09:45, prior to the start of the procedure. A Minimum amount of bleeding was controlled with Pressure. The procedure was tolerated well with a pain level of 0 throughout and a pain level of 0 following the procedure. Post Debridement Measurements: 11.5cm length x 25cm width x 0.1cm depth; 22.58cm^3 volume. Character of Wound/Ulcer Post Debridement is improved. Post procedure Diagnosis Wound #1: Same as Pre-Procedure Wound #2 Pre-procedure diagnosis of Wound #2 is a Lymphedema located on the Right,Circumferential Lower Leg . There was a Excisional Skin/Subcutaneous Tissue Debridement with a total area of 4 sq cm performed by [name], DO. With the following instrument(s): Curette to remove Viable and Non-Viable tissue/material. Material removed includes Subcutaneous Tissue and Slough and after achieving pain control using Lidocaine 4% T opical Solution. A time out was conducted at 09:45, prior to the start of the procedure. A Minimum amount of bleeding was controlled with Pressure. The procedure was tolerated well with a pain level of 0 throughout and a pain level of 0 following the procedure. Post Debridement Measurements: 13cm length x 28cm width x 0.1cm depth; 28.588cm^3 volume. Character of Wound/Ulcer Post Debridement is improved. Post procedure Diagnosis Wound #2: Same as Pre-Procedure Plan Follow-up Appointments: Return Appointment in 2 weeks. - Dr. Heber [place] and [place], Room 8 08/06/2022 130pm Monday- patient to come here after appt with arterial studies. Nurse Visit: - Nurse visit Wednesday 08/01/2022 1045 overflow room 6 Other: - Dr. Gwenlyn Found office to perform your arterial studies on 08/06/2022 1000. Bathing/ Shower/ Hygiene: May shower with protection but do not get wound dressing(s) wet. Edema Control - Lymphedema / SCD / Other: Elevate legs to the level of the heart or above for 30 minutes daily and/or when sitting, a frequency of: - 3-4 times a  day throughout the day. Avoid standing for long periods of time. Exercise regularly WOUND #1: - Lower Leg Wound Laterality: Left, Circumferential Peri-Wound Care: Triamcinolone 15 (g) 1 x Per Week/30 Days Discharge Instructions: Use triamcinolone 15 (g) as directed Peri-Wound Care: Sween Lotion (Moisturizing lotion) 1 x Per Week/30 Days Discharge Instructions: Apply moisturizing lotion as directed Prim Dressing: KerraCel Ag Gelling Fiber Dressing, 4x5 in (silver alginate) 1 x Per Week/30 Days ary Discharge Instructions: Apply silver alginate to wound bed as instructed Secondary Dressing: ABD Pad, 8x10 1 x Per Week/30 Days Discharge Instructions: Apply over primary dressing as directed. Com pression Wrap: Kerlix Roll 4.5x3.1 (in/yd) 1 x Per Week/30 Days Discharge Instructions: Apply Kerlix and Coban compression as directed. Com pression Wrap: Coban Self-Adherent Wrap 4x5 (in/yd) 1 x Per Week/30 Days Discharge Instructions: Apply over Kerlix as directed. Com pression Wrap: unna boot first layer 1 x Per Week/30 Days Discharge Instructions: apply unna boot first layer at upper portion of lower leg to secure wrap in place. WOUND #2: - Lower Leg Wound Laterality: Right, Circumferential Peri-Wound Care: Triamcinolone 15 (g) 1 x Per Week/30 Days Discharge Instructions: Use triamcinolone 15 (g) as directed Peri-Wound Care: Sween Lotion (Moisturizing lotion) 1 x Per Week/30 Days Discharge Instructions: Apply moisturizing lotion as directed Prim Dressing: KerraCel Ag Gelling Fiber Dressing, 4x5 in (silver alginate) 1 x Per Week/30 Days ary Discharge Instructions: Apply silver alginate to wound  bed as instructed Secondary Dressing: ABD Pad, 8x10 1 x Per Week/30 Days Discharge Instructions: Apply over primary dressing as directed. Compression Wrap: Kerlix Roll 4.5x3.1 (in/yd) 1 x Per Week/30 Days Discharge Instructions: Apply Kerlix and Coban compression as directed. Compression Wrap: Coban  Self-Adherent Wrap 4x5 (in/yd) 1 x Per Week/30 Days Discharge Instructions: Apply over Kerlix as directed. Compression Wrap: unna boot first layer 1 x Per Week/30 Days Discharge Instructions: apply unna boot first layer at upper portion of lower leg to secure wrap in place. 1. In office sharp debridement 2. Silver alginate under Kerlix/Coban 3. Follow-up in 1 week Electronic Signature(s) Signed: 07/27/2022 11:50:19 AM By: [name] DO Entered By: [name] on 07/27/2022 10:36:16 -------------------------------------------------------------------------------- HxROS Details Patient Name: Date of Service: [name], [place] RIA N 07/27/2022 9:00 [place] Record Number: [id number] Patient Account Number: [id number] Date of Birth/Sex: Treating RN: [birth year] (72 y.o. [name] Primary Care Provider: Marlowe Sax Other Clinician: Referring Provider: Treating Provider/Extender: [name] Ngetich, Dinah Weeks in Treatment: 1 Information Obtained From Patient Chart Hematologic/Lymphatic Medical History: Positive for: Anemia - iron Cardiovascular Medical History: Positive for: Hypertension Past Medical History Notes: hyperlipidemia Gastrointestinal Medical History: Past Medical History Notes: GERD Diverticulitis Endocrine Medical History: Positive for: Type II Diabetes Time with diabetes: 72 years old Treated with: Insulin, Diet Blood sugar tested every day: Yes Tested : three times a week. Musculoskeletal Medical History: Positive for: Gout; Osteoarthritis Past Medical History Notes: carpel tunnel bilateral-surgery to right hand Oncologic Medical History: Positive for: Received Radiation - 2023 Past Medical History Notes: endometrial cancer skin Ca Immunizations Pneumococcal Vaccine: Received Pneumococcal Vaccination: Yes Received Pneumococcal Vaccination On or After 60th Birthday: No Implantable Devices No devices added Hospitalization / Surgery  History Type of Hospitalization/Surgery one ovary removed 2020 pilonidal cyst extraction 1969 diverculitis temp colostomy Family and Social History Cancer: Yes - Father; Diabetes: Yes - Siblings; Former smoker - quit 1988; Marital Status - Married; Alcohol Use: Moderate - cocktails x2 a week; Drug Use: No History; Caffeine Use: Never; Financial Concerns: No; Food, Clothing or Shelter Needs: No; Support System Lacking: No; Transportation Concerns: No Electronic Signature(s) Signed: 07/27/2022 11:50:19 AM By: [name] DO Signed: 07/27/2022 5:18:09 PM By: [name] RN, BSN Entered By: [name] on 07/27/2022 10:14:22 -------------------------------------------------------------------------------- SuperBill Details Patient Name: Date of Service: [name] 07/27/2022 Medical Record Number: [id number] Patient Account Number: [id number] Date of Birth/Sex: Treating RN: August 22, 1950 (72 y.o. [name] Primary Care Provider: Marlowe Sax Other Clinician: Referring Provider: Treating Provider/Extender: Grier Rocher, Dinah Weeks in Treatment: 1 Diagnosis Coding ICD-10 Codes Code Description [phone] 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d L97.822 Non-pressure chronic ulcer of other part of left lower leg with fat layer exposed I89.0 Lymphedema, not elsewhere classified I87.333 Chronic venous hypertension (idiopathic) with ulcer and inflammation of bilateral lower extremity E11.622 Type 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 Facility Procedures CPT4 Code: [id number] Description: 20355 - DEB SUBQ TISSUE 20 SQ CM/< ICD-10 Diagnosis Description L97.812 Non-pressure chronic ulcer of other part of right lower leg with fat layer exp L97.822 Non-pressure chronic ulcer of other part of left lower leg with fat layer expo Modifier: osed sed Quantity: 1 Physician  Procedures Electronic Signature(s) Signed: 07/27/2022 11:50:19 AM By: [name] DO Entered By: [name] on 07/27/2022 10:36:30

## 2022-07-30 ENCOUNTER — Ambulatory Visit (INDEPENDENT_AMBULATORY_CARE_PROVIDER_SITE_OTHER): Payer: Medicare Other | Admitting: Adult Health

## 2022-07-30 ENCOUNTER — Encounter: Payer: Self-pay | Admitting: Adult Health

## 2022-07-30 VITALS — BP 150/82 | HR 121 | Temp 97.9°F | Resp 20 | Ht 64.5 in | Wt 300.0 lb

## 2022-07-30 DIAGNOSIS — N3281 Overactive bladder: Secondary | ICD-10-CM | POA: Diagnosis not present

## 2022-07-30 DIAGNOSIS — C541 Malignant neoplasm of endometrium: Secondary | ICD-10-CM

## 2022-07-30 DIAGNOSIS — I10 Essential (primary) hypertension: Secondary | ICD-10-CM

## 2022-07-30 DIAGNOSIS — E118 Type 2 diabetes mellitus with unspecified complications: Secondary | ICD-10-CM

## 2022-07-30 DIAGNOSIS — E782 Mixed hyperlipidemia: Secondary | ICD-10-CM | POA: Diagnosis not present

## 2022-07-30 DIAGNOSIS — M1A9XX0 Chronic gout, unspecified, without tophus (tophi): Secondary | ICD-10-CM

## 2022-07-30 MED ORDER — AMLODIPINE BESYLATE 5 MG PO TABS: 5.0000 mg | ORAL_TABLET | Freq: Every day | ORAL | 0 refills | Status: DC

## 2022-07-30 MED ORDER — OXYBUTYNIN CHLORIDE ER 10 MG PO TB24: 10.0000 mg | ORAL_TABLET | Freq: Every day | ORAL | 3 refills | Status: DC

## 2022-07-30 MED ORDER — ALLOPURINOL 300 MG PO TABS: 300.0000 mg | ORAL_TABLET | Freq: Every day | ORAL | 3 refills | Status: DC

## 2022-07-30 MED ORDER — CARVEDILOL 6.25 MG PO TABS: 6.2500 mg | ORAL_TABLET | Freq: Two times a day (BID) | ORAL | 3 refills | Status: DC

## 2022-07-30 NOTE — Patient Instructions (Signed)
Preventive Care 65 Years and Older, Female Preventive care refers to lifestyle choices and visits with your health care provider that can promote health and wellness. Preventive care visits are also called wellness exams. What can I expect for my preventive care visit? Counseling Your health care provider may ask you questions about your: Medical history, including: Past medical problems. Family medical history. Pregnancy and menstrual history. History of falls. Current health, including: Memory and ability to understand (cognition). Emotional well-being. Home life and relationship well-being. Sexual activity and sexual health. Lifestyle, including: Alcohol, nicotine or tobacco, and drug use. Access to firearms. Diet, exercise, and sleep habits. Work and work environment. Sunscreen use. Safety issues such as seatbelt and bike helmet use. Physical exam Your health care provider will check your: Height and weight. These may be used to calculate your BMI (body mass index). BMI is a measurement that tells if you are at a healthy weight. Waist circumference. This measures the distance around your waistline. This measurement also tells if you are at a healthy weight and may help predict your risk of certain diseases, such as type 2 diabetes and high blood pressure. Heart rate and blood pressure. Body temperature. Skin for abnormal spots. What immunizations do I need?  Vaccines are usually given at various ages, according to a schedule. Your health care provider will recommend vaccines for you based on your age, medical history, and lifestyle or other factors, such as travel or where you work. What tests do I need? Screening Your health care provider may recommend screening tests for certain conditions. This may include: Lipid and cholesterol levels. Hepatitis C test. Hepatitis B test. HIV (human immunodeficiency virus) test. STI (sexually transmitted infection) testing, if you are at  risk. Lung cancer screening. Colorectal cancer screening. Diabetes screening. This is done by checking your blood sugar (glucose) after you have not eaten for a while (fasting). Mammogram. Talk with your health care provider about how often you should have regular mammograms. BRCA-related cancer screening. This may be done if you have a family history of breast, ovarian, tubal, or peritoneal cancers. Bone density scan. This is done to screen for osteoporosis. Talk with your health care provider about your test results, treatment options, and if necessary, the need for more tests. Follow these instructions at home: Eating and drinking  Eat a diet that includes fresh fruits and vegetables, whole grains, lean protein, and low-fat dairy products. Limit your intake of foods with high amounts of sugar, saturated fats, and salt. Take vitamin and mineral supplements as recommended by your health care provider. Do not drink alcohol if your health care provider tells you not to drink. If you drink alcohol: Limit how much you have to 0-1 drink a day. Know how much alcohol is in your drink. In the [place] one drink equals one 12 oz bottle of beer (355 mL), one 5 oz glass of wine (148 mL), or one 1 oz glass of hard liquor (44 mL). Lifestyle Brush your teeth every morning and night with fluoride toothpaste. Floss one time each day. Exercise for at least 30 minutes 5 or more days each week. Do not use any products that contain nicotine or tobacco. These products include cigarettes, chewing tobacco, and vaping devices, such as e-cigarettes. If you need help quitting, ask your health care provider. Do not use drugs. If you are sexually active, practice safe sex. Use a condom or other form of protection in order to prevent STIs. Take aspirin only as told by   your health care provider. Make sure that you understand how much to take and what form to take. Work with your health care provider to find out whether it  is safe and beneficial for you to take aspirin daily. Ask your health care provider if you need to take a cholesterol-lowering medicine (statin). Find healthy ways to manage stress, such as: Meditation, yoga, or listening to music. Journaling. Talking to a trusted person. Spending time with friends and family. Minimize exposure to UV radiation to reduce your risk of skin cancer. Safety Always wear your seat belt while driving or riding in a vehicle. Do not drive: If you have been drinking alcohol. Do not ride with someone who has been drinking. When you are tired or distracted. While texting. If you have been using any mind-altering substances or drugs. Wear a helmet and other protective equipment during sports activities. If you have firearms in your house, make sure you follow all gun safety procedures. What's next? Visit your health care provider once a year for an annual wellness visit. Ask your health care provider how often you should have your eyes and teeth checked. Stay up to date on all vaccines. This information is not intended to replace advice given to you by your health care provider. Make sure you discuss any questions you have with your health care provider. Document Revised: 06/14/2021 Document Reviewed: 06/14/2021 Elsevier Patient Education  2023 Elsevier Inc.  

## 2022-07-30 NOTE — Progress Notes (Unsigned)
[facility] clinic  Provider:   Durenda Age  DNP  Code Status: Full Code  Goals of Care:     07/30/2022    9:49 AM  Advanced Directives  Does Patient Have a Medical Advance Directive? No  Would patient like information on creating a medical advance directive? No - Patient declined     Chief Complaint  Patient presents with   Establish Care    Patient is here to establish care.patient does not have pill bottles in initial appointment    HPI: Patient is a 72 y.o. female seen today  to establish care and refill prescription. She has PMH of diabetes mellitus, uterine cancer, hyperlipidemia, hypertension and gout. 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 She just finished her radiation treatment for her endometrial cancer in [name] before moving back to [place]. She said that she smoked 3 packs a day from age 54 to 58 then quit "cold [place]". As a result, she has gained weight and became diabetic. She has a sister with breast cancer, another sister with uterine cancer and another sister with multiple myeloma. Two of her siblings are diabetic and her bother is pre-diabetic. Her father committed suicide at 43 years old after being diagnosed with cancer in different parts of his body. Her mother died of heart failure.  She currently follows up at Wound clinic for her bilateral lower extremity wounds. She used an Transport planner today but verbalized that she is able to walk at home. She complains of having occasional shortness of breath at night. O2 sat on room air is 95%. She said that she takes Furosemide 20 mg daily as needed for hypertension. BP this morning is 150/82   Past Medical History:  Diagnosis Date   Arthritis    Carpal tunnel syndrome    bilateral, had surgery on right hand   Diabetes mellitus without complication ([place])    Diverticulitis    Endometrial cancer ([place])    GERD (gastroesophageal reflux disease)    Gout    History of iron deficiency     Hyperlipidemia    Hypertension    Skin cancer     Past Surgical History:  Procedure Laterality Date   CARPAL TUNNEL RELEASE Right    COLON SURGERY     COLONOSCOPY     x2   COLOSTOMY     COLOSTOMY REVERSAL     DILATATION & CURETTAGE/HYSTEROSCOPY WITH MYOSURE N/A 07/17/2019   Procedure: DILATATION & CURETTAGE/HYSTEROSCOPY WITH MYOSURE;  Surgeon: Louretta Shorten, MD;  Location: [facility];  Service: Gynecology;  Laterality: N/A;   OOPHORECTOMY     unilateral   PILONIDAL CYST EXCISION     ROBOTIC ASSISTED TOTAL HYSTERECTOMY WITH BILATERAL SALPINGO OOPHERECTOMY N/A 08/20/2019   Procedure: DIAGNSOTIC LAPAROSCOPY, DILATION AND CURETTAGE, IUD PLACEMENT ;  Surgeon: Everitt Amber, MD;  Location: [facility];  Service: Gynecology;  Laterality: N/A;   SKIN CANCER EXCISION      Allergies  Allergen Reactions   Actos [Pioglitazone]     Unknown reaction   Metformin    Lisinopril Cough   Metformin And Related     Gas     Outpatient Encounter Medications as of 07/30/2022  Medication Sig   aspirin 325 MG tablet Take 325 mg by mouth daily.    atorvastatin (LIPITOR) 40 MG tablet Take 40 mg by mouth every evening.    bacitracin ointment Apply 1 Application topically 2 (two) times daily.   Carboxymethylcellul-Glycerin (LUBRICATING  EYE DROPS OP) Place 1 drop into both eyes daily as needed (dry eyes).   Cholecalciferol (VITAMIN D3 PO) Take 1 capsule by mouth every evening.    Coenzyme Q10 (COQ10) 100 MG CAPS Take 100 mg by mouth every evening.   diphenhydrAMINE (BENADRYL) 25 mg capsule Take 50 mg by mouth at bedtime.   doxycycline (VIBRAMYCIN) 100 MG capsule Take 1 capsule (100 mg total) by mouth 2 (two) times daily.   famotidine (PEPCID) 20 MG tablet Take 20 mg by mouth daily.    GLUCOSAMINE-CHONDROITIN PO Take 1 tablet by mouth 2 (two) times daily.   ibuprofen (ADVIL) 200 MG tablet Take 800 mg by mouth every 6 (six) hours as needed for headache or moderate pain.   ibuprofen (ADVIL) 600 MG  tablet Take 1 tablet (600 mg total) by mouth every 6 (six) hours as needed for moderate pain. For AFTER surgery only   insulin NPH-regular Human (70-30) 100 UNIT/ML injection Inject 75 Units into the skin 2 (two) times daily.    KRILL OIL PO Take 1 capsule by mouth daily.   losartan (COZAAR) 100 MG tablet Take 100 mg by mouth every evening.    meclizine (ANTIVERT) 12.5 MG tablet Take 1 tablet (12.5 mg total) by mouth 3 (three) times daily as needed for dizziness.   Multiple Vitamins-Minerals (MULTIVITAMIN WITH MINERALS) tablet Take 1 tablet by mouth daily.   [DISCONTINUED] allopurinol (ZYLOPRIM) 300 MG tablet Take 300 mg by mouth daily.   [DISCONTINUED] amLODipine (NORVASC) 5 MG tablet Take 5 mg by mouth daily.   [DISCONTINUED] carvedilol (COREG) 6.25 MG tablet Take 6.25 mg by mouth 2 (two) times daily with a meal.   [DISCONTINUED] oxybutynin (DITROPAN-XL) 10 MG 24 hr tablet Take 10 mg by mouth at bedtime.   allopurinol (ZYLOPRIM) 300 MG tablet Take 1 tablet (300 mg total) by mouth daily.   amLODipine (NORVASC) 5 MG tablet Take 1 tablet (5 mg total) by mouth daily.   carvedilol (COREG) 6.25 MG tablet Take 1 tablet (6.25 mg total) by mouth 2 (two) times daily with a meal.   levonorgestrel (MIRENA) 20 MCG/24HR IUD 1 Intra Uterine Device (1 each total) by Intrauterine route once for 1 dose. To be placed at [facility] on 8/20   oxybutynin (DITROPAN-XL) 10 MG 24 hr tablet Take 1 tablet (10 mg total) by mouth at bedtime.   No facility-administered encounter medications on file as of 07/30/2022.    Review of Systems:  Review of Systems  Constitutional:  Negative for appetite change, chills, fatigue and fever.  HENT:  Negative for congestion, hearing loss, rhinorrhea and sore throat.   Eyes: Negative.   Respiratory:  Positive for shortness of breath. Negative for cough and wheezing.   Cardiovascular:  Negative for chest pain, palpitations and leg swelling.  Gastrointestinal:  Negative for abdominal  pain, constipation, diarrhea, nausea and vomiting.  Genitourinary:  Negative for dysuria.  Musculoskeletal:  Negative for arthralgias, back pain and myalgias.  Skin:  Negative for color change, rash and wound.  Neurological:  Negative for dizziness, weakness and headaches.  Psychiatric/Behavioral:  Negative for behavioral problems. The patient is not nervous/anxious.     Health Maintenance  Topic Date Due   FOOT EXAM  Never done   OPHTHALMOLOGY EXAM  Never done   Hepatitis C Screening  Never done   Zoster Vaccines- Shingrix (1 of 2) Never done   COLONOSCOPY (Pts 45-73yrs Insurance coverage will need to be confirmed)  Never done   MAMMOGRAM  Never done   Pneumonia Vaccine 97+ Years old (1 - PCV) Never done   DEXA SCAN  Never done   HEMOGLOBIN A1C  02/17/2020   COVID-19 Vaccine (3 - Pfizer risk series) 03/29/2020   INFLUENZA VACCINE  07/31/2022   TETANUS/TDAP  06/21/2024   HPV VACCINES  Aged Out    Physical Exam: Vitals:   07/30/22 0958  BP: (!) 150/82  Pulse: (!) 121  Resp: 20  Temp: 97.9 F (36.6 C)  SpO2: 95%  Weight: 300 lb (136.1 kg)  Height: 5' 4.5" (1.638 m)   Body mass index is 50.7 kg/m. Physical Exam Constitutional:      General: She is not in acute distress.    Appearance: She is obese.  HENT:     Head: Normocephalic and atraumatic.     Nose: Nose normal.     Mouth/Throat:     Mouth: Mucous membranes are moist.  Eyes:     Conjunctiva/sclera: Conjunctivae normal.  Cardiovascular:     Rate and Rhythm: Normal rate and regular rhythm.  Pulmonary:     Effort: Pulmonary effort is normal.     Breath sounds: Normal breath sounds.  Abdominal:     General: Bowel sounds are normal.     Palpations: Abdomen is soft.  Musculoskeletal:        General: Normal range of motion.     Cervical back: Normal range of motion.  Skin:    General: Skin is warm and dry.     Comments: Bilateral lower extremities covered with unna boot dressing.  Neurological:     General:  No focal deficit present.     Mental Status: She is alert and oriented to person, place, and time.  Psychiatric:        Mood and Affect: Mood normal.        Behavior: Behavior normal.        Thought Content: Thought content normal.        Judgment: Judgment normal.     Labs reviewed: Basic Metabolic Panel: No results for input(s): "NA", "K", "CL", "CO2", "GLUCOSE", "BUN", "CREATININE", "CALCIUM", "MG", "PHOS", "TSH" in the last 8760 hours. Liver Function Tests: No results for input(s): "AST", "ALT", "ALKPHOS", "BILITOT", "PROT", "ALBUMIN" in the last 8760 hours. No results for input(s): "LIPASE", "AMYLASE" in the last 8760 hours. No results for input(s): "AMMONIA" in the last 8760 hours. CBC: No results for input(s): "WBC", "NEUTROABS", "HGB", "HCT", "MCV", "PLT" in the last 8760 hours. Lipid Panel: No results for input(s): "CHOL", "HDL", "LDLCALC", "TRIG", "CHOLHDL", "LDLDIRECT" in the last 8760 hours. Lab Results  Component Value Date   HGBA1C 5.4 08/17/2019    Procedures since last visit: DG Chest 2 View  Result Date: 07/24/2022 CLINICAL DATA:  Acute shortness of breath and nonproductive cough for 3 weeks. EXAM: CHEST - 2 VIEW COMPARISON:  09/21/2019 FINDINGS: Technically suboptimal exam due to AP seated positioning. Mild cardiomegaly seen. Diffuse interstitial prominence suspicious for mild interstitial edema. Small bilateral pleural effusions are best seen on lateral projection. No evidence of pulmonary consolidation. IMPRESSION: Technically suboptimal exam. Findings suspicious for mild congestive heart failure and small bilateral pleural effusions. Electronically Signed   By: Marlaine [name] M.D.   On: 07/24/2022 08:07    Assessment/Plan  1. Essential hypertension -   discussed that she take Furosemide 20 mg daily X 1 week  - amLODipine (NORVASC) 5 MG tablet; Take 1 tablet (5 mg total) by mouth daily.  Dispense: 30 tablet; Refill: 0 -  carvedilol (COREG) 6.25 MG tablet; Take 1  tablet (6.25 mg total) by mouth 2 (two) times daily with a meal.  Dispense: 60 tablet; Refill: 3 - furosemide (LASIX) 20 MG tablet; Take 1 tablet (20 mg total) by mouth daily as needed.  Dispense: 30 tablet; Refill: 3  2. Morbid (severe) obesity due to excess calories ([place]) -  discussed dietary food choices  3. Endometrial cancer ([place]) -   S/P radiation treatment in [name] -  will follow up with GYN/ONC  4. Diabetes mellitus with complication [facility]) Lab Results  Component Value Date   HGBA1C 5.4 08/17/2019   -   continue Novolin 70-30 Insulin 60 units in am and 40 units in PM  5. Mixed hyperlipidemia -  continue Atorvastatin  6. OAB (overactive bladder) - oxybutynin (DITROPAN-XL) 10 MG 24 hr tablet; Take 1 tablet (10 mg total) by mouth at bedtime.  Dispense: 30 tablet; Refill: 3  7. Chronic gout without tophus, unspecified cause, unspecified site - allopurinol (ZYLOPRIM) 300 MG tablet; Take 1 tablet (300 mg total) by mouth daily.  Dispense: 30 tablet; Refill: 3     Labs/tests ordered:  on next visit, will check for lipid panel. Uric acid, CBC with differentials, CMP  Next appt:  08/07/22

## 2022-08-01 ENCOUNTER — Encounter (HOSPITAL_BASED_OUTPATIENT_CLINIC_OR_DEPARTMENT_OTHER): Payer: Medicare Other | Attending: Physician Assistant | Admitting: Physician Assistant

## 2022-08-01 ENCOUNTER — Telehealth: Payer: Self-pay | Admitting: *Deleted

## 2022-08-01 DIAGNOSIS — L97822 Non-pressure chronic ulcer of other part of left lower leg with fat layer exposed: Secondary | ICD-10-CM | POA: Diagnosis not present

## 2022-08-01 DIAGNOSIS — M1990 Unspecified osteoarthritis, unspecified site: Secondary | ICD-10-CM | POA: Diagnosis not present

## 2022-08-01 DIAGNOSIS — L03116 Cellulitis of left lower limb: Secondary | ICD-10-CM | POA: Diagnosis not present

## 2022-08-01 DIAGNOSIS — Z923 Personal history of irradiation: Secondary | ICD-10-CM | POA: Diagnosis not present

## 2022-08-01 DIAGNOSIS — Z8542 Personal history of malignant neoplasm of other parts of uterus: Secondary | ICD-10-CM | POA: Insufficient documentation

## 2022-08-01 DIAGNOSIS — L03115 Cellulitis of right lower limb: Secondary | ICD-10-CM | POA: Insufficient documentation

## 2022-08-01 DIAGNOSIS — I10 Essential (primary) hypertension: Secondary | ICD-10-CM | POA: Insufficient documentation

## 2022-08-01 DIAGNOSIS — L97812 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d: Secondary | ICD-10-CM | POA: Diagnosis not present

## 2022-08-01 DIAGNOSIS — E11622 Type 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: Secondary | ICD-10-CM | POA: Insufficient documentation

## 2022-08-01 DIAGNOSIS — I890 Lymphedema, not elsewhere classified: Secondary | ICD-10-CM | POA: Insufficient documentation

## 2022-08-01 DIAGNOSIS — I87333 Chronic venous hypertension (idiopathic) with ulcer and inflammation of bilateral lower extremity: Secondary | ICD-10-CM | POA: Diagnosis not present

## 2022-08-01 MED ORDER — FUROSEMIDE 20 MG PO TABS: 20.0000 mg | ORAL_TABLET | Freq: Every day | ORAL | 3 refills | Status: DC | PRN

## 2022-08-01 NOTE — Progress Notes (Signed)
JACIE, TRISTAN ([id number]) Visit Report for 08/01/2022 Arrival Information Details Patient Name: Date of Service: [place], [name] 08/01/2022 10:45 A M Medical Record Number: [id number] Patient Account Number: [id number] Date of Birth/Sex: Treating RN: 1950/07/22 (72 y.o. [name], [name] Primary Care Penney Domanski: [name], [name] Other Clinician: Referring Ailsa Mireles: Treating Axie Hayne/Extender: Worthy Keeler Ngetich, Dinah Weeks in Treatment: 1 Visit Information History Since Last Visit All ordered tests and consults were completed: No Patient Arrived: Wheel Chair Added or deleted any medications: No Arrival Time: 10:40 Any new allergies or adverse reactions: No Accompanied By: husband Had a fall or experienced change in No Transfer Assistance: None activities of daily living that may affect Patient Identification Verified: Yes risk of falls: Secondary Verification Process Completed: Yes Signs or symptoms of abuse/neglect since last visito No Patient Requires Transmission-Based Precautions: No Hospitalized since last visit: No Patient Has Alerts: No Implantable device outside of the clinic excluding No cellular tissue based products placed in the center since last visit: Has Dressing in Place as Prescribed: Yes Has Compression in Place as Prescribed: Yes Pain Present Now: Yes Electronic Signature(s) Signed: 08/01/2022 4:41:40 PM By: [name] Entered By: [name] on 08/01/2022 10:41:17 -------------------------------------------------------------------------------- Clinic Level of Care Assessment Details Patient Name: Date of Service: ALYNN, ELLITHORPE 08/01/2022 10:45 A M Medical Record Number: [id number] Patient Account Number: [id number] Date of Birth/Sex: Treating RN: [birth year] (72 y.o. [name], [name] Primary Care Burma Ketcher: [name], [name] Other Clinician: Referring Nikolaos Maddocks: Treating Xzayvion Vaeth/Extender: Worthy Keeler Ngetich, Dinah Weeks in  Treatment: 1 Clinic Level of Care Assessment Items TOOL 4 Quantity Score '[]'$  - 0 Use when only an EandM is performed on FOLLOW-UP visit ASSESSMENTS - Nursing Assessment / Reassessment X- 1 10 Reassessment of Co-morbidities (includes updates in patient status) X- 1 5 Reassessment of Adherence to Treatment Plan ASSESSMENTS - Wound and Skin A ssessment / Reassessment '[]'$  - 0 Simple Wound Assessment / Reassessment - one wound X- 2 5 Complex Wound Assessment / Reassessment - multiple wounds '[]'$  - 0 Dermatologic / Skin Assessment (not related to wound area) ASSESSMENTS - Focused Assessment '[]'$  - 0 Circumferential Edema Measurements - multi extremities '[]'$  - 0 Nutritional Assessment / Counseling / Intervention '[]'$  - 0 Lower Extremity Assessment (monofilament, tuning fork, pulses) '[]'$  - 0 Peripheral Arterial Disease Assessment (using hand held doppler) ASSESSMENTS - Ostomy and/or Continence Assessment and Care '[]'$  - 0 Incontinence Assessment and Management '[]'$  - 0 Ostomy Care Assessment and Management (repouching, etc.) PROCESS - Coordination of Care '[]'$  - 0 Simple Patient / Family Education for ongoing care X- 1 20 Complex (extensive) Patient / Family Education for ongoing care X- 1 10 Staff obtains Programmer, systems, Records, T Results / Process Orders est '[]'$  - 0 Staff telephones HHA, Nursing Homes / Clarify orders / etc '[]'$  - 0 Routine Transfer to another Facility (non-emergent condition) '[]'$  - 0 Routine Hospital Admission (non-emergent condition) '[]'$  - 0 New Admissions / Biomedical engineer / Ordering NPWT Apligraf, etc. , '[]'$  - 0 Emergency Hospital Admission (emergent condition) '[]'$  - 0 Simple Discharge Coordination X- 1 15 Complex (extensive) Discharge Coordination PROCESS - Special Needs '[]'$  - 0 Pediatric / Minor Patient Management '[]'$  - 0 Isolation Patient Management '[]'$  - 0 Hearing / Language / Visual special needs '[]'$  - 0 Assessment of Community assistance (transportation,  D/C planning, etc.) '[]'$  - 0 Additional assistance / Altered mentation '[]'$  - 0 Support Surface(s) Assessment (bed, cushion, seat, etc.) INTERVENTIONS -  Wound Cleansing / Measurement '[]'$  - 0 Simple Wound Cleansing - one wound X- 2 5 Complex Wound Cleansing - multiple wounds '[]'$  - 0 Wound Imaging (photographs - any number of wounds) '[]'$  - 0 Wound Tracing (instead of photographs) '[]'$  - 0 Simple Wound Measurement - one wound '[]'$  - 0 Complex Wound Measurement - multiple wounds INTERVENTIONS - Wound Dressings '[]'$  - 0 Small Wound Dressing one or multiple wounds '[]'$  - 0 Medium Wound Dressing one or multiple wounds X- 2 20 Large Wound Dressing one or multiple wounds '[]'$  - 0 Application of Medications - topical '[]'$  - 0 Application of Medications - injection INTERVENTIONS - Miscellaneous '[]'$  - 0 External ear exam '[]'$  - 0 Specimen Collection (cultures, biopsies, blood, body fluids, etc.) '[]'$  - 0 Specimen(s) / Culture(s) sent or taken to Lab for analysis '[]'$  - 0 Patient Transfer (multiple staff / Civil Service fast streamer / Similar devices) '[]'$  - 0 Simple Staple / Suture removal (25 or less) '[]'$  - 0 Complex Staple / Suture removal (26 or more) '[]'$  - 0 Hypo / Hyperglycemic Management (close monitor of Blood Glucose) '[]'$  - 0 Ankle / Brachial Index (ABI) - do not check if billed separately X- 1 5 Vital Signs Has the patient been seen at the hospital within the last three years: Yes Total Score: 125 Level Of Care: New/Established - Level 4 Electronic Signature(s) Signed: 08/01/2022 4:41:40 PM By: [name] Entered By: [name] on 08/01/2022 13:53:38 -------------------------------------------------------------------------------- Encounter Discharge Information Details Patient Name: Date of Service: [place], [name] RIA N 08/01/2022 10:45 A M Medical Record Number: [id number] Patient Account Number: [id number] Date of Birth/Sex: Treating RN: 02/14/1950 (72 y.o. [name] Primary Care Laurabelle Gorczyca: MEDINA-V A  Jeri [place], [name] Other Clinician: [name] Referring Tawonna Esquer: Treating Darrick Greenlaw/Extender: Tyron Russell, Dinah Weeks in Treatment: 1 Encounter Discharge Information Items Discharge Condition: Stable Ambulatory Status: Wheelchair Discharge Destination: Home Transportation: Private Auto Accompanied By: husband Schedule Follow-up Appointment: Yes Clinical Summary of Care: Electronic Signature(s) Signed: 08/01/2022 4:41:40 PM By: [name] Entered By: [name] on 08/01/2022 13:47:10 -------------------------------------------------------------------------------- Patient/Caregiver Education Details Patient Name: Date of Service: Aleda Grana 8/2/2023andnbsp10:45 A M Medical Record Number: [id number] Patient Account Number: [id number] Date of Birth/Gender: Treating RN: July 16, 1950 (72 y.o. [name] Primary Care Physician: MEDINA-V A Jeri [place], [name] Other Clinician: [name] Referring Physician: Treating Physician/Extender: Merlene Laughter in Treatment: 1 Education Assessment Education Provided To: Patient Education Topics Provided Electronic Signature(s) Signed: 08/01/2022 4:41:40 PM By: [name] Entered By: [name] on 08/01/2022 13:45:57 -------------------------------------------------------------------------------- Wound Assessment Details Patient Name: Date of Service: [place], [name] RIA N 08/01/2022 10:45 A M Medical Record Number: [id number] Patient Account Number: [id number] Date of Birth/Sex: Treating RN: 1950/02/26 (72 y.o. [name] Primary Care Yuepheng Schaller: [name], [name] Other Clinician: Referring Lita Flynn: Treating Ashly Yepez/Extender: Worthy Keeler Ngetich, Dinah Weeks in Treatment: 1 Wound Status Wound Number: 1 Primary Etiology: Lymphedema Wound Location: Left, Circumferential Lower Leg Wound Status: Open Wounding Event: Gradually Appeared Date Acquired: 07/05/2022 Weeks Of  Treatment: 1 Clustered Wound: Yes Wound Measurements Length: (cm) 11.5 Width: (cm) 25 Depth: (cm) 0.1 Area: (cm) 225.802 Volume: (cm) 22.58 % Reduction in Area: 43.2% % Reduction in Volume: 43.2% Wound Description Classification: Full Thickness Without Exposed Support Structur Exudate Amount: Medium Exudate Type: Serosanguineous Exudate Color: red, brown es Treatment Notes Wound #1 (Lower Leg) Wound Laterality: Left, Circumferential Cleanser Peri-Wound Care Triamcinolone 15 (g) Discharge Instruction: Use  triamcinolone 15 (g) as directed Sween Lotion (Moisturizing lotion) Discharge Instruction: Apply moisturizing lotion as directed Topical Primary Dressing KerraCel Ag Gelling Fiber Dressing, 4x5 in (silver alginate) Discharge Instruction: Apply silver alginate to wound bed as instructed Secondary Dressing ABD Pad, 8x10 Discharge Instruction: Apply over primary dressing as directed. Secured With Compression Wrap Kerlix Roll 4.5x3.1 (in/yd) Discharge Instruction: Apply Kerlix and Coban compression as directed. Coban Self-Adherent Wrap 4x5 (in/yd) Discharge Instruction: Apply over Kerlix as directed. unna boot first layer Discharge Instruction: apply unna boot first layer at upper portion of lower leg to secure wrap in place. Compression Stockings Add-Ons Electronic Signature(s) Signed: 08/01/2022 4:41:40 PM By: [name] Signed: 08/01/2022 6:08:09 PM By: [name] RN, BSN Entered By: [name] on 08/01/2022 13:39:04 -------------------------------------------------------------------------------- Wound Assessment Details Patient Name: Date of Service: [place], [name] RIA N 08/01/2022 10:45 A M Medical Record Number: [id number] Patient Account Number: [id number] Date of Birth/Sex: Treating RN: [birth year] (72 y.o. [name] Primary Care Lindon Kiel: [name], [name] Other Clinician: Referring Shiela Bruns: Treating Torre Schaumburg/Extender: Worthy Keeler Ngetich, Dinah Weeks in Treatment: 1 Wound Status Wound Number: 2 Primary Etiology: Lymphedema Wound Location: Right, Circumferential Lower Leg Wound Status: Open Wounding Event: Gradually Appeared Date Acquired: 07/05/2022 Weeks Of Treatment: 1 Clustered Wound: Yes Wound Measurements Length: (cm) 13 Width: (cm) 28 Depth: (cm) 0.1 Area: (cm) 285.885 Volume: (cm) 28.588 % Reduction in Area: 26.3% % Reduction in Volume: 26.3% Wound Description Classification: Full Thickness Without Exposed Support Structu Exudate Amount: Medium Exudate Type: Serosanguineous Exudate Color: red, brown res Treatment Notes Wound #2 (Lower Leg) Wound Laterality: Right, Circumferential Cleanser Peri-Wound Care Triamcinolone 15 (g) Discharge Instruction: Use triamcinolone 15 (g) as directed Sween Lotion (Moisturizing lotion) Discharge Instruction: Apply moisturizing lotion as directed Topical Primary Dressing KerraCel Ag Gelling Fiber Dressing, 4x5 in (silver alginate) Discharge Instruction: Apply silver alginate to wound bed as instructed Secondary Dressing ABD Pad, 8x10 Discharge Instruction: Apply over primary dressing as directed. Secured With Compression Wrap Kerlix Roll 4.5x3.1 (in/yd) Discharge Instruction: Apply Kerlix and Coban compression as directed. Coban Self-Adherent Wrap 4x5 (in/yd) Discharge Instruction: Apply over Kerlix as directed. unna boot first layer Discharge Instruction: apply unna boot first layer at upper portion of lower leg to secure wrap in place. Compression Stockings Add-Ons Electronic Signature(s) Signed: 08/01/2022 4:41:40 PM By: [name] Signed: 08/01/2022 6:08:09 PM By: [name] RN, BSN Entered By: [name] on 08/01/2022 13:39:04 -------------------------------------------------------------------------------- Vitals Details Patient Name: Date of Service: [place], [name] RIA N 08/01/2022 10:45 A M Medical Record Number: [id number] Patient  Account Number: [id number] Date of Birth/Sex: Treating RN: December 18, 1950 (72 y.o. [name] Primary Care Tabb Croghan: [name], [name] Other Clinician: Referring Iker Nuttall: Treating Zavier Canela/Extender: Worthy Keeler Ngetich, Dinah Weeks in Treatment: 1 Vital Signs Time Taken: 10:42 Temperature (F): 98.1 Height (in): 64 Pulse (bpm): 88 Weight (lbs): 325 Respiratory Rate (breaths/min): 20 Body Mass Index (BMI): 55.8 Blood Pressure (mmHg): 157/79 Capillary Blood Glucose (mg/dl): 150 Reference Range: 80 - 120 mg / dl Electronic Signature(s) Signed: 08/01/2022 4:41:40 PM By: [name] Entered By: [name] on 08/01/2022 10:42:32

## 2022-08-01 NOTE — Telephone Encounter (Signed)
Patient scheduled to see Dr Berline Lopes on 9/29 for a follow up

## 2022-08-01 NOTE — Progress Notes (Signed)
[name], [name] ([id number]) Visit Report for 08/01/2022 SuperBill Details Patient Name: Date of Service: [name], [name] [name] 08/01/2022 Medical Record Number: [id number] Patient Account Number: [id number] Date of Birth/Sex: Treating RN: 1950-10-14 (72 y.o. [name], [name] Primary Care Provider: [name], [name] Other Clinician: Referring Provider: Treating Provider/Extender: Tyron Russell, Dinah Weeks in Treatment: 1 Diagnosis Coding ICD-10 Codes Code Description [phone] 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d L97.822 Non-pressure chronic ulcer of other part of left lower leg with fat layer exposed I89.0 Lymphedema, not elsewhere classified I87.333 Chronic venous hypertension (idiopathic) with ulcer and inflammation of bilateral lower extremity E11.622 Type 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 Facility Procedures CPT4 Code Description Modifier Quantity [id number] - WOUND CARE VISIT-LEV 4 EST PT 1 Electronic Signature(s) Signed: 08/01/2022 4:41:40 PM By: [name] Signed: 08/01/2022 5:04:53 PM By: Worthy Keeler PA-C Entered By: [name] on 08/01/2022 13:53:45

## 2022-08-06 ENCOUNTER — Other Ambulatory Visit (HOSPITAL_COMMUNITY): Payer: Self-pay | Admitting: Internal Medicine

## 2022-08-06 ENCOUNTER — Ambulatory Visit (HOSPITAL_COMMUNITY)
Admission: RE | Admit: 2022-08-06 | Discharge: 2022-08-06 | Disposition: A | Discharge status: Home / Self Care | Payer: Medicare Other | Source: Ambulatory Visit | Attending: Internal Medicine | Admitting: Internal Medicine

## 2022-08-06 ENCOUNTER — Encounter (HOSPITAL_BASED_OUTPATIENT_CLINIC_OR_DEPARTMENT_OTHER): Payer: Medicare Other | Admitting: Internal Medicine

## 2022-08-06 DIAGNOSIS — L97822 Non-pressure chronic ulcer of other part of left lower leg with fat layer exposed: Secondary | ICD-10-CM | POA: Diagnosis not present

## 2022-08-06 DIAGNOSIS — E11622 Type 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: Secondary | ICD-10-CM | POA: Diagnosis not present

## 2022-08-06 DIAGNOSIS — L03115 Cellulitis of right lower limb: Secondary | ICD-10-CM | POA: Diagnosis not present

## 2022-08-06 DIAGNOSIS — L97812 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d: Secondary | ICD-10-CM

## 2022-08-06 DIAGNOSIS — I87333 Chronic venous hypertension (idiopathic) with ulcer and inflammation of bilateral lower extremity: Secondary | ICD-10-CM | POA: Diagnosis not present

## 2022-08-06 DIAGNOSIS — I739 Peripheral vascular disease, unspecified: Secondary | ICD-10-CM | POA: Insufficient documentation

## 2022-08-06 DIAGNOSIS — L03116 Cellulitis of left lower limb: Secondary | ICD-10-CM | POA: Diagnosis not present

## 2022-08-06 NOTE — Progress Notes (Signed)
[name], [name] ([id number]) Visit Report for 08/06/2022 Chief Complaint Document Details Patient Name: Date of Service: [name], [name] [name] 08/06/2022 1:30 PM Medical Record Number: [id number] Patient Account Number: [id number] Date of Birth/Sex: Treating RN: February 25, 1950 (72 y.o. [name] Primary Care Provider: [facility] [place], [name] Other Clinician: Referring Provider: Treating Provider/Extender: Grier Rocher, Dinah Weeks in Treatment: 2 Information Obtained from: Patient Chief Complaint 07/19/2022; bilateral lower extremity wounds Electronic Signature(s) Signed: 08/06/2022 2:16:44 PM By: [name] DO Entered By: [name] on 08/06/2022 14:09:54 -------------------------------------------------------------------------------- Debridement Details Patient Name: Date of Service: [name], [name] 08/06/2022 1:30 PM Medical Record Number: [id number] Patient Account Number: [id number] Date of Birth/Sex: Treating RN: [birth year] (72 y.o. [name], [name] Primary Care Provider: [name], [name] Other Clinician: Referring Provider: Treating Provider/Extender: Grier Rocher, Dinah Weeks in Treatment: 2 Debridement Performed for Assessment: Wound #2 Right,Circumferential Lower Leg Performed By: Physician [name], DO Debridement Type: Debridement Level of Consciousness (Pre-procedure): Awake and Alert Pre-procedure Verification/Time Out Yes - 13:50 Taken: Start Time: 13:51 Pain Control: Lidocaine 5% topical ointment T Area Debrided (L x W): otal 3 (cm) x 0.2 (cm) = 0.6 (cm) Tissue and other material debrided: Non-Viable, Slough, Slough Level: Non-Viable Tissue Debridement Description: Selective/Open Wound Instrument: Curette Bleeding: Minimum Hemostasis Achieved: Pressure End Time: 13:55 Procedural Pain: 0 Post Procedural Pain: 0 Response to Treatment: Procedure was tolerated well Level of Consciousness (Post- Awake and  Alert procedure): Post Debridement Measurements of Total Wound Length: (cm) 13 Width: (cm) 26 Depth: (cm) 0.1 Volume: (cm) 26.546 Character of Wound/Ulcer Post Debridement: Improved Post Procedure Diagnosis Same as Pre-procedure Electronic Signature(s) Signed: 08/06/2022 2:16:44 PM By: [name] DO Signed: 08/06/2022 4:18:42 PM By: [name] RN, BSN Entered By: [name] on 08/06/2022 13:56:00 -------------------------------------------------------------------------------- HPI Details Patient Name: Date of Service: [name], [name] 08/06/2022 1:30 PM Medical Record Number: [id number] Patient Account Number: [id number] Date of Birth/Sex: Treating RN: 06-16-1950 (72 y.o. [name] Primary Care Provider: [facility] [name] Other Clinician: Referring Provider: Treating Provider/Extender: Grier Rocher, Dinah Weeks in Treatment: 2 History of Present Illness HPI Description: Admission 07/19/2022 [name] Is a 72 year old female with a past medical history of endometrial cancer, lymphedema, insulin-dependent type 2 diabetes that presents to the clinic for a 4-week history of weeping to her lower extremities bilaterally. She visited the ED for this issue on 07/07/2022 and she was prescribed doxycycline For bilateral lower extremity cellulitis. She has completed this course. She currently denies signs of infection. She does not have compression stockings. She states she recently gained weight over the past couple months. She has never had wounds before. 7/28; patient presents for follow-up. We have been using silver alginate under Kerlix/Coban. She is scheduled to get her ABIs with TBI's on 8/7. She states that the wraps slid down to her mid shin. She denies signs of infection. 8/7; patient presents for follow-up. We have been using silver alginate under Kerlix/Coban. She had arterial studies done that showed an ABI of 0.86 on the right and 0.82 on the  left with multiphasic waveforms throughout the lower extremities. Electronic Signature(s) Signed: 08/06/2022 2:16:44 PM By: [name] DO Entered By: [name] on 08/06/2022 14:12:09 -------------------------------------------------------------------------------- Physical Exam Details Patient Name: Date of Service: [name], [name] 08/06/2022 1:30 PM Medical Record Number: [id number] Patient Account Number: [id number] Date of Birth/Sex: Treating RN: 1950-02-28 (72 y.o. [name] Primary Care Provider: [facility], [name] Other Clinician: Referring Provider: Treating Provider/Extender: [name] Nget[place], Dinah Weeks in Treatment: 2 Constitutional respirations regular, non-labored and within target range for patient.. Cardiovascular 2+ dorsalis pedis/posterior tibialis pulses. Psychiatric pleasant and cooperative. Notes Scattered wounds to the lower extremities bilaterally. Mild weeping noted. Nonviable tissue debrided to the right medial aspect. 3+ pitting edema to the thigh. Lymphedema skin changes. Electronic Signature(s) Signed: 08/06/2022 2:16:44 PM By: [name] DO Entered By: [name] on 08/06/2022 14:12:57 -------------------------------------------------------------------------------- Physician Orders Details Patient Name: Date of Service: [name], [name] 08/06/2022 1:30 PM Medical Record Number: [id number] Patient Account Number: [id number] Date of Birth/Sex: Treating RN: 1950/08/04 (72 y.o. [name] Primary Care Provider: [name], [name] Other Clinician: Referring Provider: Treating Provider/Extender: Grier Rocher, Dinah Weeks in Treatment: 2 Verbal / Phone Orders: No Diagnosis Coding ICD-10 Coding Code Description [phone] 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d L97.822 Non-pressure chronic ulcer of other part of left lower leg with fat layer  exposed I89.0 Lymphedema, not elsewhere classified I87.333 Chronic venous hypertension (idiopathic) with ulcer and inflammation of bilateral lower extremity E11.622 Type 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 Follow-up Appointments ppointment in 1 week. - Dr. Heber [place] and [place], Room 8 1245 08/13/2022 Monday Return A ppointment in 2 weeks. - Dr. Heber [place] and [place], Room 8 1245 08/20/2022 Monday Return A Other: - Bring in juxtalites HD weekly in case wound closure. Keep Appt with Dr. Gwenlyn Found office r/t vascular consult. Bathing/ Shower/ Hygiene May shower with protection but do not get wound dressing(s) wet. Edema Control - Lymphedema / SCD / Other Elevate legs to the level of the heart or above for 30 minutes daily and/or when sitting, a frequency of: - 3-4 times a day throughout the day. Avoid standing for long periods of time. Exercise regularly Wound Treatment Wound #1 - Lower Leg Wound Laterality: Left, Circumferential Peri-Wound Care: Triamcinolone 15 (g) 1 x Per Week/30 Days Discharge Instructions: Use triamcinolone 15 (g) as directed Peri-Wound Care: Sween Lotion (Moisturizing lotion) 1 x Per Week/30 Days Discharge Instructions: Apply moisturizing lotion as directed Prim Dressing: KerraCel Ag Gelling Fiber Dressing, 4x5 in (silver alginate) 1 x Per Week/30 Days ary Discharge Instructions: Apply silver alginate to wound bed as instructed Secondary Dressing: ABD Pad, 8x10 1 x Per Week/30 Days Discharge Instructions: Apply over primary dressing as directed. Compression Wrap: ThreePress (3 layer compression wrap) 1 x Per Week/30 Days Discharge Instructions: Apply three layer compression as directed. Compression Wrap: unna boot first layer 1 x Per Week/30 Days Discharge Instructions: apply unna boot first layer at upper portion of lower leg to secure wrap in place. Wound #2 - Lower Leg Wound Laterality: Right, Circumferential Peri-Wound Care:  Triamcinolone 15 (g) 1 x Per Week/30 Days Discharge Instructions: Use triamcinolone 15 (g) as directed Peri-Wound Care: Sween Lotion (Moisturizing lotion) 1 x Per Week/30 Days Discharge Instructions: Apply moisturizing lotion as directed Prim Dressing: KerraCel Ag Gelling Fiber Dressing, 4x5 in (silver alginate) ary 1 x Per Week/30 Days Discharge Instructions: Apply silver alginate to wound bed as instructed Secondary Dressing: ABD Pad, 8x10 1 x Per Week/30 Days Discharge Instructions: Apply over primary dressing as directed. Compression Wrap: ThreePress (3 layer compression wrap) 1 x Per Week/30 Days Discharge Instructions: Apply three layer compression as directed. Compression Wrap: unna boot first layer 1 x Per Week/30 Days Discharge Instructions: apply unna boot first layer at  upper portion of lower leg to secure wrap in place. Electronic Signature(s) Signed: 08/06/2022 2:16:44 PM By: [name] DO Entered By: [name] on 08/06/2022 14:13:06 -------------------------------------------------------------------------------- Problem List Details Patient Name: Date of Service: [name], [name] RIA [name] 08/06/2022 1:30 PM Medical Record Number: [id number] Patient Account Number: [id number] Date of Birth/Sex: Treating RN: 1950/04/23 (72 y.o. [name], [name] Primary Care Provider: [name], [name] Other Clinician: Referring Provider: Treating Provider/Extender: Grier Rocher, Dinah Weeks in Treatment: 2 Active Problems ICD-10 Encounter Code Description Active Date MDM Diagnosis L97.812 Non-pressure chronic ulcer of other part of right lower leg with fat layer 07/19/2022 No Yes exposed L97.822 Non-pressure chronic ulcer of other part of left lower leg with fat layer exposed7/20/2023 No Yes I89.0 Lymphedema, not elsewhere classified 07/19/2022 No Yes I87.333 Chronic venous hypertension (idiopathic) with ulcer and inflammation of 07/19/2022 No Yes bilateral lower  extremity E11.622 Type 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 07/19/2022 No Yes 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 07/19/2022 No Yes Inactive Problems Resolved Problems Electronic Signature(s) Signed: 08/06/2022 2:16:44 PM By: [name] DO Entered By: [name] on 08/06/2022 14:09:30 -------------------------------------------------------------------------------- Progress Note Details Patient Name: Date of Service: [name], [name] 08/06/2022 1:30 PM Medical Record Number: [id number] Patient Account Number: [id number] Date of Birth/Sex: Treating RN: August 02, 1950 (72 y.o. [name] Primary Care Provider: [name], [name] Other Clinician: Referring Provider: Treating Provider/Extender: Grier Rocher, Dinah Weeks in Treatment: 2 Subjective Chief Complaint Information obtained from Patient 07/19/2022; bilateral lower extremity wounds History of Present Illness (HPI) Admission 07/19/2022 Ms. Ashten Sarnowski Is a 72 year old female with a past medical history of endometrial cancer, lymphedema, insulin-dependent type 2 diabetes that presents to the clinic for a 4-week history of weeping to her lower extremities bilaterally. She visited the ED for this issue on 07/07/2022 and she was prescribed doxycycline For bilateral lower extremity cellulitis. She has completed this course. She currently denies signs of infection. She does not have compression stockings. She states she recently gained weight over the past couple months. She has never had wounds before. 7/28; patient presents for follow-up. We have been using silver alginate under Kerlix/Coban. She is scheduled to get her ABIs with TBI's on 8/7. She states that the wraps slid down to her mid shin. She denies signs of infection. 8/7; patient presents for follow-up. We have been using silver alginate under Kerlix/Coban. She had arterial studies done that showed an ABI of 0.86 on the right and 0.82 on the left  with multiphasic waveforms throughout the lower extremities. Patient History Information obtained from Patient, Chart. Family History Cancer - Father, Diabetes - Siblings. Social History Former smoker - quit 1988, Marital Status - Married, Alcohol Use - Moderate - cocktails x2 a week, Drug Use - No History, Caffeine Use - Never. Medical History Hematologic/Lymphatic Patient has history of Anemia - iron Cardiovascular Patient has history of Hypertension Endocrine Patient has history of Type II Diabetes Musculoskeletal Patient has history of Gout, Osteoarthritis Oncologic Patient has history of Received Radiation - 2023 Hospitalization/Surgery History - one ovary removed 2020. - pilonidal cyst extraction 1969. - diverculitis temp colostomy. Medical A Surgical History Notes nd Cardiovascular hyperlipidemia Gastrointestinal GERD Diverticulitis Musculoskeletal carpel tunnel bilateral-surgery to right hand Oncologic endometrial cancer skin Ca Objective Constitutional respirations regular, non-labored and within target range for patient.. Vitals Time Taken: 1:35 PM, Height: 64 in, Weight: 325 lbs, BMI: 55.8, Temperature: 98.1 F, Pulse: 120 bpm, Respiratory Rate: 24 breaths/min, Blood  Pressure: 151/71 mmHg, Capillary Blood Glucose: 150 mg/dl. Cardiovascular 2+ dorsalis pedis/posterior tibialis pulses. Psychiatric pleasant and cooperative. General Notes: Scattered wounds to the lower extremities bilaterally. Mild weeping noted. Nonviable tissue debrided to the right medial aspect. 3+ pitting edema to the thigh. Lymphedema skin changes. Integumentary (Hair, Skin) Wound #1 status is Open. Original cause of wound was Gradually Appeared. The date acquired was: 07/05/2022. The wound has been in treatment 2 weeks. The wound is located on the Left,Circumferential Lower Leg. The wound measures 9cm length x 19cm width x 0.1cm depth; 134.303cm^2 area and 13.43cm^3 volume. There is Fat Layer  (Subcutaneous Tissue) exposed. There is no tunneling or undermining noted. There is a large amount of serosanguineous drainage noted. The wound margin is distinct with the outline attached to the wound base. There is medium (34-66%) red, pink granulation within the wound bed. There is a medium (34-66%) amount of necrotic tissue within the wound bed including Adherent Slough. Wound #2 status is Open. Original cause of wound was Gradually Appeared. The date acquired was: 07/05/2022. The wound has been in treatment 2 weeks. The wound is located on the Right,Circumferential Lower Leg. The wound measures 13cm length x 26cm width x 0.1cm depth; 265.465cm^2 area and 26.546cm^3 volume. There is Fat Layer (Subcutaneous Tissue) exposed. There is no tunneling or undermining noted. There is a large amount of serosanguineous drainage noted. The wound margin is distinct with the outline attached to the wound base. There is large (67-100%) red, pink granulation within the wound bed. There is a small (1-33%) amount of necrotic tissue within the wound bed including Adherent Slough. Assessment Active Problems ICD-10 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d Non-pressure chronic ulcer of other part of left lower leg with fat layer exposed Lymphedema, not elsewhere classified Chronic venous hypertension (idiopathic) with ulcer and inflammation of bilateral lower extremity Type 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 Malignant neoplasm of endometrium Patient's wounds are stable. I debrided nonviable tissue. I recommended continuing with silver alginate and based on ABIs will increase the compression to 3 layer. Procedures Wound #2 Pre-procedure diagnosis of Wound #2 is a Lymphedema located on the Right,Circumferential Lower Leg . There was a Selective/Open Wound Non-Viable Tissue Debridement with a total area of 0.6 sq cm performed by [name], DO. With the following  instrument(s): Curette to remove Non-Viable tissue/material. Material removed includes [facility] after achieving pain control using Lidocaine 5% topical ointment. A time out was conducted at 13:50, prior to the start of the procedure. A Minimum amount of bleeding was controlled with Pressure. The procedure was tolerated well with a pain level of 0 throughout and a pain level of 0 following the procedure. Post Debridement Measurements: 13cm length x 26cm width x 0.1cm depth; 26.546cm^3 volume. Character of Wound/Ulcer Post Debridement is improved. Post procedure Diagnosis Wound #2: Same as Pre-Procedure Pre-procedure diagnosis of Wound #2 is a Lymphedema located on the Right,Circumferential Lower Leg . There was a Three Layer Compression Therapy Procedure by [name], RN. Post procedure Diagnosis Wound #2: Same as Pre-Procedure Wound #1 Pre-procedure diagnosis of Wound #1 is a Lymphedema located on the Left,Circumferential Lower Leg . There was a Three Layer Compression Therapy Procedure by [name], RN. Post procedure Diagnosis Wound #1: Same as Pre-Procedure Plan Follow-up Appointments: Return Appointment in 1 week. - Dr. Heber [place] and [place], Room 8 1245 08/13/2022 Monday Return Appointment in 2 weeks. - Dr. Heber [place] and [place], Room 8 1245 08/20/2022  Monday Other: - Bring in juxtalites HD weekly in case wound closure. Keep Appt with Dr. Gwenlyn Found office r/t vascular consult. Bathing/ Shower/ Hygiene: May shower with protection but do not get wound dressing(s) wet. Edema Control - Lymphedema / SCD / Other: Elevate legs to the level of the heart or above for 30 minutes daily and/or when sitting, a frequency of: - 3-4 times a day throughout the day. Avoid standing for long periods of time. Exercise regularly WOUND #1: - Lower Leg Wound Laterality: Left, Circumferential Peri-Wound Care: Triamcinolone 15 (g) 1 x Per Week/30 Days Discharge Instructions: Use triamcinolone 15 (g) as  directed Peri-Wound Care: Sween Lotion (Moisturizing lotion) 1 x Per Week/30 Days Discharge Instructions: Apply moisturizing lotion as directed Prim Dressing: KerraCel Ag Gelling Fiber Dressing, 4x5 in (silver alginate) 1 x Per Week/30 Days ary Discharge Instructions: Apply silver alginate to wound bed as instructed Secondary Dressing: ABD Pad, 8x10 1 x Per Week/30 Days Discharge Instructions: Apply over primary dressing as directed. Com pression Wrap: ThreePress (3 layer compression wrap) 1 x Per Week/30 Days Discharge Instructions: Apply three layer compression as directed. Com pression Wrap: unna boot first layer 1 x Per Week/30 Days Discharge Instructions: apply unna boot first layer at upper portion of lower leg to secure wrap in place. WOUND #2: - Lower Leg Wound Laterality: Right, Circumferential Peri-Wound Care: Triamcinolone 15 (g) 1 x Per Week/30 Days Discharge Instructions: Use triamcinolone 15 (g) as directed Peri-Wound Care: Sween Lotion (Moisturizing lotion) 1 x Per Week/30 Days Discharge Instructions: Apply moisturizing lotion as directed Prim Dressing: KerraCel Ag Gelling Fiber Dressing, 4x5 in (silver alginate) 1 x Per Week/30 Days ary Discharge Instructions: Apply silver alginate to wound bed as instructed Secondary Dressing: ABD Pad, 8x10 1 x Per Week/30 Days Discharge Instructions: Apply over primary dressing as directed. Com pression Wrap: ThreePress (3 layer compression wrap) 1 x Per Week/30 Days Discharge Instructions: Apply three layer compression as directed. Com pression Wrap: unna boot first layer 1 x Per Week/30 Days Discharge Instructions: apply unna boot first layer at upper portion of lower leg to secure wrap in place. 1. In office sharp debridement 2. Silver alginate under 3 layer compression 3. Follow-up in 1 week Electronic Signature(s) Signed: 08/06/2022 2:16:44 PM By: [name] DO Entered By: [name] on 08/06/2022  14:14:56 -------------------------------------------------------------------------------- HxROS Details Patient Name: Date of Service: [name], [name] 08/06/2022 1:30 PM Medical Record Number: [id number] Patient Account Number: [id number] Date of Birth/Sex: Treating RN: 1950/09/06 (71 y.o. [name] Primary Care Provider: [name], [name] Other Clinician: Referring Provider: Treating Provider/Extender: Grier Rocher, Dinah Weeks in Treatment: 2 Information Obtained From Patient Chart Hematologic/Lymphatic Medical History: Positive for: Anemia - iron Cardiovascular Medical History: Positive for: Hypertension Past Medical History Notes: hyperlipidemia Gastrointestinal Medical History: Past Medical History Notes: GERD Diverticulitis Endocrine Medical History: Positive for: Type II Diabetes Time with diabetes: 72 years old Treated with: Insulin, Diet Blood sugar tested every day: Yes Tested : three times a week. Musculoskeletal Medical History: Positive for: Gout; Osteoarthritis Past Medical History Notes: carpel tunnel bilateral-surgery to right hand Oncologic Medical History: Positive for: Received Radiation - 2023 Past Medical History Notes: endometrial cancer skin Ca Immunizations Pneumococcal Vaccine: Received Pneumococcal Vaccination: Yes Received Pneumococcal Vaccination On or After 60th Birthday: No Implantable Devices No devices added Hospitalization / Surgery History Type of Hospitalization/Surgery one ovary removed 2020 pilonidal cyst extraction 1969 diverculitis temp colostomy Family and Social History Cancer: Yes - Father; Diabetes:  Yes - Siblings; Former smoker - quit 1988; Marital Status - Married; Alcohol Use: Moderate - cocktails x2 a week; Drug Use: No History; Caffeine Use: Never; Financial Concerns: No; Food, Clothing or Shelter Needs: No; Support System Lacking: No; Transportation Concerns: No Electronic  Signature(s) Signed: 08/06/2022 2:16:44 PM By: [name] DO Signed: 08/06/2022 4:18:42 PM By: [name] RN, BSN Entered By: [name] on 08/06/2022 14:12:14 -------------------------------------------------------------------------------- SuperBill Details Patient Name: Date of Service: [name] 08/06/2022 Medical Record Number: [id number] Patient Account Number: [id number] Date of Birth/Sex: Treating RN: 16-Dec-1950 (72 y.o. [name] Primary Care Provider: [name], [name] Other Clinician: Referring Provider: Treating Provider/Extender: Grier Rocher, Dinah Weeks in Treatment: 2 Diagnosis Coding ICD-10 Codes Code Description [phone] 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d L97.822 Non-pressure chronic ulcer of other part of left lower leg with fat layer exposed I89.0 Lymphedema, not elsewhere classified I87.333 Chronic venous hypertension (idiopathic) with ulcer and inflammation of bilateral lower extremity E11.622 Type 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 Facility Procedures CPT4 Code: [id number] Description: [phone] - DEBRIDE WOUND 1ST 20 SQ CM OR < ICD-10 Diagnosis Description M09.470 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d Modifier: Quantity: 1 CPT4 Code: [id number] Description: (Facility Use Only) [id number] - [place] LWR LT LEG Modifier: 84 Quantity: 1 Physician Procedures : CPT4 Code Description Modifier [id number] - WC PHYS DEBR WO ANESTH 20 SQ CM ICD-10 Diagnosis Description S56.812 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d Quantity: 1 Electronic Signature(s) Signed: 08/06/2022 2:16:44 PM By: [name] DO Entered By: [name] on 08/06/2022 14:16:18

## 2022-08-06 NOTE — Progress Notes (Signed)
[name], [name] ([id number]) Visit Report for 08/06/2022 Arrival Information Details Patient Name: Date of Service: [name], [name] 08/06/2022 1:30 PM Medical Record Number: [id number] Patient Account Number: [id number] Date of Birth/Sex: Treating RN: August 25, 1950 (72 y.o. [name], [name] Primary Care [name]: [name], [name] [name]: Referring [name]: Treating [name]/[name]: [name], [name] in Treatment: 2 Visit Information History Since Last Visit All ordered tests and consults were completed: Yes Patient Arrived: Wheel Chair Added or deleted any medications: No Arrival Time: 13:35 Any new allergies or adverse reactions: No Accompanied By: self Had a fall or experienced change in No Transfer Assistance: None activities of daily living that may affect Patient Identification Verified: Yes risk of falls: Secondary Verification Process Completed: Yes Signs or symptoms of abuse/neglect since last visito No Patient Requires Transmission-Based No Hospitalized since last visit: No Precautions: Implantable device outside of the clinic excluding No Patient Has Alerts: Yes cellular tissue based products placed in the center Patient Alerts: 08/06/2022 TBI L 0.59 R0.65 since last visit: 08/06/2022 ABI R 0.86 Has Dressing in Place as Prescribed: No L0.82 Has Compression in Place as Prescribed: No Pain Present Now: No Notes per patient arterial studies this morning. patient removed wraps before tests. Electronic Signature(s) Signed: 08/06/2022 4:18:42 PM By: [name] RN, BSN Entered By: [name] on 08/06/2022 13:42:21 -------------------------------------------------------------------------------- Compression Therapy Details Patient Name: Date of Service: [name] [place], [place] RIA N 08/06/2022 1:30 PM Medical Record Number: [id number] Patient Account Number: [id number] Date of Birth/Sex: Treating RN: March 06, 1950 (72 y.o. [name] Primary Care  [name]: [name], [name] [name]: Referring Kaitlin Ardito: Treating [name]/[name]: [name], [name] in Treatment: 2 Compression Therapy Performed for Wound Assessment: Wound #1 Left,Circumferential Lower Leg Performed By: [name] [name], RN Compression Type: Three Layer Post Procedure Diagnosis Same as Pre-procedure Electronic Signature(s) Signed: 08/06/2022 4:18:42 PM By: [name] RN, BSN Entered By: [name] on 08/06/2022 13:51:52 -------------------------------------------------------------------------------- Compression Therapy Details Patient Name: Date of Service: [name] [place], [place] RIA N 08/06/2022 1:30 PM Medical Record Number: [id number] Patient Account Number: [id number] Date of Birth/Sex: Treating RN: Mar 26, 1950 (72 y.o. [name] Primary Care [name]: [name] [name]: Referring Eimy Plaza: Treating [name]/[name]: [name], [name] in Treatment: 2 Compression Therapy Performed for Wound Assessment: Wound #2 Right,Circumferential Lower Leg Performed By: [name] [name], RN Compression Type: Three Layer Post Procedure Diagnosis Same as Pre-procedure Electronic Signature(s) Signed: 08/06/2022 4:18:42 PM By: [name] RN, BSN Entered By: [name] on 08/06/2022 13:51:52 -------------------------------------------------------------------------------- Encounter Discharge Information Details Patient Name: Date of Service: [name], [name] RIA N 08/06/2022 1:30 PM Medical Record Number: [id number] Patient Account Number: [id number] Date of Birth/Sex: Treating RN: [birth year] (72 y.o. [name] Primary Care Nolah Krenzer: [name], [name] [name]: Referring [name]: Treating [name]/[name]: [name], [name] in Treatment: 2 Encounter Discharge Information Items Post Procedure Vitals Discharge Condition: Stable Temperature  (F): 98.1 Ambulatory Status: Wheelchair Pulse (bpm): 120 Discharge Destination: Home Respiratory Rate (breaths/min): 20 Transportation: Private Auto Blood Pressure (mmHg): 151/71 Accompanied By: self Schedule Follow-up Appointment: Yes Clinical Summary of Care: Electronic Signature(s) Signed: 08/06/2022 4:18:42 PM By: [name] RN, BSN Entered By: [name] on 08/06/2022 13:57:37 -------------------------------------------------------------------------------- Lower Extremity Assessment Details Patient Name: Date of Service: [name], [place] RIA N 08/06/2022 1:30 PM Medical Record Number: [id number] Patient Account Number: [id number] Date of Birth/Sex: Treating RN: [birth year] (72 y.o. [name] Primary Care  [name]: [name], [name] [name]: Referring [name]: Treating [name]: [name], [name] in Treatment: 2 Edema Assessment Assessed: [Left: Yes] [Right: Yes] Edema: [Left: Yes] [Right: Yes] Calf Left: Right: Point of Measurement: 39 cm From Medial Instep 54.5 cm 57.5 cm Ankle Left: Right: Point of Measurement: 10 cm From Medial Instep 31 cm 31 cm Vascular Assessment Pulses: Dorsalis Pedis Palpable: [Left:Yes] [Right:Yes] Blood Pressure: Brachial: [Left:148] Ankle: [Left:Dorsalis Pedis: 121 0.82] [Right:Dorsalis Pedis: 118 0.80] Electronic Signature(s) Signed: 08/06/2022 2:16:44 PM By: [name] Shan DO Signed: 08/06/2022 4:18:42 PM By: [name] RN, BSN Entered By: [name] Shan on 08/06/2022 13:50:37 -------------------------------------------------------------------------------- Multi Wound Chart Details Patient Name: Date of Service: [name] [place], [place] RIA N 08/06/2022 1:30 PM Medical Record Number: [id number] Patient Account Number: [id number] Date of Birth/Sex: Treating RN: 1950-03-16 (72 y.o. [name] Primary Care [name]: [name] [name]: Referring [name]: Treating  [name]/[name]: [name], [name] in Treatment: 2 Vital Signs Height(in): 64 Capillary Blood Glucose(mg/dl): 150 Weight(lbs): 325 Pulse(bpm): 120 Body Mass Index(BMI): 55.8 Blood Pressure(mmHg): 151/71 Temperature(F): 98.1 Respiratory Rate(breaths/min): 24 Photos: [N/A:N/A] Left, Circumferential Lower Leg Right, Circumferential Lower Leg N/A Wound Location: Gradually Appeared Gradually Appeared N/A Wounding Event: Lymphedema Lymphedema N/A Primary Etiology: Anemia, Hypertension, Type II Anemia, Hypertension, Type II N/A Comorbid History: Diabetes, Gout, Osteoarthritis, Diabetes, Gout, Osteoarthritis, Received Radiation Received Radiation 07/05/2022 07/05/2022 N/A Date Acquired: 2 2 N/A [name] of Treatment: Open Open N/A Wound Status: No No N/A Wound Recurrence: Yes Yes N/A Clustered Wound: 3 3 N/A Clustered Quantity: 9x19x0.1 13x26x0.1 N/A Measurements L x W x D (cm) 134.303 265.465 N/A A (cm) : rea 13.43 26.546 N/A Volume (cm) : 66.20% 31.60% N/A % Reduction in Area: 66.20% 31.60% N/A % Reduction in Volume: Full Thickness Without Exposed Full Thickness Without Exposed N/A Classification: Support Structures Support Structures Large Large N/A Exudate A mount: Serosanguineous Serosanguineous N/A Exudate Type: red, brown red, brown N/A Exudate Color: Distinct, outline attached Distinct, outline attached N/A Wound Margin: Medium (34-66%) Large (67-100%) N/A Granulation A mount: Red, Pink Red, Pink N/A Granulation Quality: Medium (34-66%) Small (1-33%) N/A Necrotic A mount: Fat Layer (Subcutaneous Tissue): Yes Fat Layer (Subcutaneous Tissue): Yes N/A Exposed Structures: Fascia: No Fascia: No Tendon: No Tendon: No Muscle: No Muscle: No Joint: No Joint: No Bone: No Bone: No Medium (34-66%) Medium (34-66%) N/A Epithelialization: N/A Debridement - Selective/Open Wound N/A Debridement: Pre-procedure Verification/Time Out N/A  13:50 N/A Taken: N/A Lidocaine 5% topical ointment N/A Pain Control: N/A Slough N/A Tissue Debrided: N/A Non-Viable Tissue N/A Level: N/A 0.6 N/A Debridement A (sq cm): rea N/A Curette N/A Instrument: N/A Minimum N/A Bleeding: N/A Pressure N/A Hemostasis A chieved: N/A 0 N/A Procedural Pain: N/A 0 N/A Post Procedural Pain: N/A Procedure was tolerated well N/A Debridement Treatment Response: N/A 13x26x0.1 N/A Post Debridement Measurements L x W x D (cm) N/A 26.546 N/A Post Debridement Volume: (cm) Compression Therapy Compression Therapy N/A Procedures Performed: Debridement Treatment Notes Wound #1 (Lower Leg) Wound Laterality: Left, Circumferential Cleanser Peri-Wound Care Triamcinolone 15 (g) Discharge Instruction: Use triamcinolone 15 (g) as directed Sween Lotion (Moisturizing lotion) Discharge Instruction: Apply moisturizing lotion as directed Topical Primary Dressing KerraCel Ag Gelling Fiber Dressing, 4x5 in (silver alginate) Discharge Instruction: Apply silver alginate to wound bed as instructed Secondary Dressing ABD Pad, 8x10 Discharge Instruction: Apply over primary dressing as directed. Secured With Compression Wrap ThreePress (3 layer compression wrap) Discharge Instruction: Apply three layer compression as directed.  unna boot first layer Discharge Instruction: apply unna boot first layer at upper portion of lower leg to secure wrap in place. Compression Stockings Add-Ons Wound #2 (Lower Leg) Wound Laterality: Right, Circumferential Cleanser Peri-Wound Care Triamcinolone 15 (g) Discharge Instruction: Use triamcinolone 15 (g) as directed Sween Lotion (Moisturizing lotion) Discharge Instruction: Apply moisturizing lotion as directed Topical Primary Dressing KerraCel Ag Gelling Fiber Dressing, 4x5 in (silver alginate) Discharge Instruction: Apply silver alginate to wound bed as instructed Secondary Dressing ABD Pad, 8x10 Discharge  Instruction: Apply over primary dressing as directed. Secured With Compression Wrap ThreePress (3 layer compression wrap) Discharge Instruction: Apply three layer compression as directed. unna boot first layer Discharge Instruction: apply unna boot first layer at upper portion of lower leg to secure wrap in place. Compression Stockings Add-Ons Electronic Signature(s) Signed: 08/06/2022 2:16:44 PM By: [name] Shan DO Signed: 08/06/2022 4:18:42 PM By: [name] RN, BSN Entered By: [name] Shan on 08/06/2022 14:09:46 -------------------------------------------------------------------------------- Multi-Disciplinary Care Plan Details Patient Name: Date of Service: [place], [name] RIA N 08/06/2022 1:30 PM Medical Record Number: [id number] Patient Account Number: [id number] Date of Birth/Sex: Treating RN: 10-Oct-1950 (72 y.o. [name] Primary Care Maximus Hoffert: [name] [name]: Referring Jerrilynn Mikowski: Treating Kristia Jupiter/[name]: [name], [name] in Treatment: 2 Active Inactive Pain, Acute or Chronic Nursing Diagnoses: Pain, acute or chronic: actual or potential Potential alteration in comfort, pain Goals: Patient will verbalize adequate pain control and receive pain control interventions during procedures as needed Date Initiated: 07/19/2022 Target Resolution Date: 08/31/2022 Goal Status: Active Patient/caregiver will verbalize comfort level met Date Initiated: 07/19/2022 Date Inactivated: 08/06/2022 Target Resolution Date: 08/06/2022 Goal Status: Met Interventions: Encourage patient to take pain medications as prescribed Provide education on pain management Reposition patient for comfort Treatment Activities: Administer pain control measures as ordered : 07/19/2022 Notes: Venous Leg Ulcer Nursing Diagnoses: Actual venous Insuffiency (use after diagnosis is confirmed) Potential for venous Insuffiency (use before diagnosis  confirmed) Goals: Non-invasive venous studies are completed as ordered Date Initiated: 07/19/2022 Target Resolution Date: 08/31/2022 Goal Status: Active Interventions: Assess peripheral edema status every visit. Compression as ordered Provide education on venous insufficiency Treatment Activities: Non-invasive vascular studies : 07/19/2022 Notes: Wound/Skin Impairment Nursing Diagnoses: Knowledge deficit related to ulceration/compromised skin integrity Goals: Patient/caregiver will verbalize understanding of skin care regimen Date Initiated: 07/19/2022 Target Resolution Date: 08/31/2022 Goal Status: Active Interventions: Assess patient/caregiver ability to perform ulcer/skin care regimen upon admission and as needed Assess ulceration(s) every visit Provide education on ulcer and skin care Treatment Activities: Skin care regimen initiated : 07/19/2022 Topical wound management initiated : 07/19/2022 Notes: Electronic Signature(s) Signed: 08/06/2022 4:18:42 PM By: [name] RN, BSN Entered By: [name] on 08/06/2022 13:42:49 -------------------------------------------------------------------------------- Pain Assessment Details Patient Name: Date of Service: [name] [place], [place] RIA N 08/06/2022 1:30 PM Medical Record Number: [id number] Patient Account Number: [id number] Date of Birth/Sex: Treating RN: [birth year] (72 y.o. [name] Primary Care Eldredge Veldhuizen: [name] [name]: Referring Ekam Bonebrake: Treating Jazyah Butsch/[name]: [name], [name] in Treatment: 2 Active Problems Location of Pain Severity and Description of Pain Patient Has Paino No Site Locations Rate the pain. Rate the pain. Current Pain Level: 0 Pain Management and Medication Current Pain Management: Medication: No Cold Application: No Rest: No Massage: No Activity: No T.E.N.S.: No Heat Application: No Leg drop or elevation: No Is the Current Pain Management  Adequate: Adequate How does your wound impact your activities of daily livingo Sleep: No Bathing: No  Appetite: No Relationship With Others: No Bladder Continence: No Emotions: No Bowel Continence: No Work: No Toileting: No Drive: No Dressing: No Hobbies: No Electronic Signature(s) Signed: 08/06/2022 4:18:42 PM By: [name] RN, BSN Entered By: [name] on 08/06/2022 13:37:48 -------------------------------------------------------------------------------- Patient/Caregiver Education Details Patient Name: Date of Service: Aleda Grana 8/7/2023andnbsp1:30 PM Medical Record Number: [id number] Patient Account Number: [id number] Date of Birth/Gender: Treating RN: 19-Sep-1950 (72 y.o. [name] Primary Care Physician: [name] [name]: Referring Physician: Treating Physician/[name]: [name], [name] in Treatment: 2 Education Assessment Education Provided To: Patient Education Topics Provided Wound/Skin Impairment: Handouts: Skin Care Do's and Dont's Methods: Explain/Verbal Responses: State content correctly Electronic Signature(s) Signed: 08/06/2022 4:18:42 PM By: [name] RN, BSN Entered By: [name] on 08/06/2022 13:42:59 -------------------------------------------------------------------------------- Wound Assessment Details Patient Name: Date of Service: [name] [place], [place] RIA N 08/06/2022 1:30 PM Medical Record Number: [id number] Patient Account Number: [id number] Date of Birth/Sex: Treating RN: 01-11-1950 (72 y.o. [name] Primary Care Khristian Phillippi: [name], [name] [name]: Referring Alanii Ramer: Treating Kaylor Maiers/[name]: [name], [name] in Treatment: 2 Wound Status Wound Number: 1 Primary Lymphedema Etiology: Wound Location: Left, Circumferential Lower Leg Wound Open Wounding Event: Gradually Appeared Status: Date Acquired: 07/05/2022 Comorbid Anemia,  Hypertension, Type II Diabetes, Gout, Osteoarthritis, [name] Of Treatment: 2 History: Received Radiation Clustered Wound: Yes Photos Wound Measurements Length: (cm) 9 Width: (cm) 19 Depth: (cm) 0.1 Clustered Quantity: 3 Area: (cm) 134.303 Volume: (cm) 13.43 % Reduction in Area: 66.2% % Reduction in Volume: 66.2% Epithelialization: Medium (34-66%) Tunneling: No Undermining: No Wound Description Classification: Full Thickness Without Exposed Support Structures Wound Margin: Distinct, outline attached Exudate Amount: Large Exudate Type: Serosanguineous Exudate Color: red, brown Foul Odor After Cleansing: No Slough/Fibrino No Wound Bed Granulation Amount: Medium (34-66%) Exposed Structure Granulation Quality: Red, Pink Fascia Exposed: No Necrotic Amount: Medium (34-66%) Fat Layer (Subcutaneous Tissue) Exposed: Yes Necrotic Quality: Adherent Slough Tendon Exposed: No Muscle Exposed: No Joint Exposed: No Bone Exposed: No Treatment Notes Wound #1 (Lower Leg) Wound Laterality: Left, Circumferential Cleanser Peri-Wound Care Triamcinolone 15 (g) Discharge Instruction: Use triamcinolone 15 (g) as directed Sween Lotion (Moisturizing lotion) Discharge Instruction: Apply moisturizing lotion as directed Topical Primary Dressing KerraCel Ag Gelling Fiber Dressing, 4x5 in (silver alginate) Discharge Instruction: Apply silver alginate to wound bed as instructed Secondary Dressing ABD Pad, 8x10 Discharge Instruction: Apply over primary dressing as directed. Secured With Compression Wrap ThreePress (3 layer compression wrap) Discharge Instruction: Apply three layer compression as directed. unna boot first layer Discharge Instruction: apply unna boot first layer at upper portion of lower leg to secure wrap in place. Compression Stockings Add-Ons Electronic Signature(s) Signed: 08/06/2022 4:18:42 PM By: [name] RN, BSN Entered By: [name] on 08/06/2022  13:40:25 -------------------------------------------------------------------------------- Wound Assessment Details Patient Name: Date of Service: [name] [place], [place] RIA N 08/06/2022 1:30 PM Medical Record Number: [id number] Patient Account Number: [id number] Date of Birth/Sex: Treating RN: [birth year] (72 y.o. [name] Primary Care Suzan Manon: [name], [name] [name]: Referring Cali Cuartas: Treating Tayler Lassen/[name]: [name], [name] in Treatment: 2 Wound Status Wound Number: 2 Primary Lymphedema Etiology: Wound Location: Right, Circumferential Lower Leg Wound Open Wounding Event: Gradually Appeared Status: Date Acquired: 07/05/2022 Comorbid Anemia, Hypertension, Type II Diabetes, Gout, Osteoarthritis, [name] Of Treatment: 2 History: Received Radiation Clustered Wound: Yes Photos Wound Measurements Length: (cm) 13 Width: (cm) 26 Depth: (cm) 0.1 Clustered Quantity: 3 Area: (cm) 265.465  Volume: (cm) 26.546 % Reduction in Area: 31.6% % Reduction in Volume: 31.6% Epithelialization: Medium (34-66%) Tunneling: No Undermining: No Wound Description Classification: Full Thickness Without Exposed Support Stru Wound Margin: Distinct, outline attached Exudate Amount: Large Exudate Type: Serosanguineous Exudate Color: red, brown ctures Foul Odor After Cleansing: No Slough/Fibrino No Wound Bed Granulation Amount: Large (67-100%) Exposed Structure Granulation Quality: Red, Pink Fascia Exposed: No Necrotic Amount: Small (1-33%) Fat Layer (Subcutaneous Tissue) Exposed: Yes Necrotic Quality: Adherent Slough Tendon Exposed: No Muscle Exposed: No Joint Exposed: No Bone Exposed: No Treatment Notes Wound #2 (Lower Leg) Wound Laterality: Right, Circumferential Cleanser Peri-Wound Care Triamcinolone 15 (g) Discharge Instruction: Use triamcinolone 15 (g) as directed Sween Lotion (Moisturizing lotion) Discharge Instruction: Apply moisturizing lotion  as directed Topical Primary Dressing KerraCel Ag Gelling Fiber Dressing, 4x5 in (silver alginate) Discharge Instruction: Apply silver alginate to wound bed as instructed Secondary Dressing ABD Pad, 8x10 Discharge Instruction: Apply over primary dressing as directed. Secured With Compression Wrap ThreePress (3 layer compression wrap) Discharge Instruction: Apply three layer compression as directed. unna boot first layer Discharge Instruction: apply unna boot first layer at upper portion of lower leg to secure wrap in place. Compression Stockings Add-Ons Electronic Signature(s) Signed: 08/06/2022 4:18:42 PM By: [name] RN, BSN Entered By: [name] on 08/06/2022 13:40:45 -------------------------------------------------------------------------------- Vitals Details Patient Name: Date of Service: [name] [place], [place] RIA N 08/06/2022 1:30 PM Medical Record Number: [id number] Patient Account Number: [id number] Date of Birth/Sex: Treating RN: January 31, 1950 (72 y.o. [name] Primary Care Faylene Allerton: [name] [name]: Referring Anastasha Ortez: Treating Sydney Hasten/[name]: [name], [name] in Treatment: 2 Vital Signs Time Taken: 13:35 Temperature (F): 98.1 Height (in): 64 Pulse (bpm): 120 Weight (lbs): 325 Respiratory Rate (breaths/min): 24 Body Mass Index (BMI): 55.8 Blood Pressure (mmHg): 151/71 Capillary Blood Glucose (mg/dl): 150 Reference Range: 80 - 120 mg / dl Electronic Signature(s) Signed: 08/06/2022 4:18:42 PM By: [name] RN, BSN Entered By: [name] on 08/06/2022 13:36:54

## 2022-08-07 ENCOUNTER — Ambulatory Visit (INDEPENDENT_AMBULATORY_CARE_PROVIDER_SITE_OTHER): Payer: Medicare Other | Admitting: Adult Health

## 2022-08-07 ENCOUNTER — Encounter: Payer: Self-pay | Admitting: Adult Health

## 2022-08-07 VITALS — BP 130/74 | HR 94 | Temp 97.3°F | Resp 22 | Ht 64.5 in | Wt 340.0 lb

## 2022-08-07 DIAGNOSIS — I10 Essential (primary) hypertension: Secondary | ICD-10-CM | POA: Diagnosis not present

## 2022-08-07 DIAGNOSIS — R051 Acute cough: Secondary | ICD-10-CM

## 2022-08-07 DIAGNOSIS — E782 Mixed hyperlipidemia: Secondary | ICD-10-CM | POA: Diagnosis not present

## 2022-08-07 DIAGNOSIS — E118 Type 2 diabetes mellitus with unspecified complications: Secondary | ICD-10-CM | POA: Diagnosis not present

## 2022-08-07 DIAGNOSIS — M1A9XX0 Chronic gout, unspecified, without tophus (tophi): Secondary | ICD-10-CM | POA: Diagnosis not present

## 2022-08-07 DIAGNOSIS — I509 Heart failure, unspecified: Secondary | ICD-10-CM

## 2022-08-07 DIAGNOSIS — K219 Gastro-esophageal reflux disease without esophagitis: Secondary | ICD-10-CM | POA: Diagnosis not present

## 2022-08-07 MED ORDER — ATORVASTATIN CALCIUM 80 MG PO TABS: 80.0000 mg | ORAL_TABLET | Freq: Every day | ORAL | 3 refills | Status: DC

## 2022-08-07 NOTE — Progress Notes (Signed)
[facility] clinic  Provider:   Durenda Age DNP  Code Status:  Full Code  Goals of Care:      07/30/2022    9:49 AM  Advanced Directives  Does Patient Have a Medical Advance Directive? No  Would patient like information on creating a medical advance directive? No - Patient declined     Chief Complaint  Patient presents with   Follow-up    Patient is here for a 1 week F/U with lab work: lipid panel. Uric acid, CBC with differentials, CMP    HPI: Patient is a 72 y.o. female seen today for an acute visit for a follow up on her shortness of breath. She was waking up at night short of breath. Chest x-ray done on 07/24/22 was suspicious for mild congestive heart failure and small bilateral effusions. She took Lasix 20 mg daily X 5 days. She now denies having episodes of shortness of breath and now complaining of occasional dry cough. BP today is 130/74. She takes Norvasc 5 mg daily and Coreg 6.25 mg BID for hypertension. She takes allopurinol 300 mg daily for gout. She takes Atorvastatin 80 mg at bedtime for hyperlipidemia. Yesterday, she followed up with [facility] for her bilateral lower leg wounds.   Past Medical History:  Diagnosis Date   Arthritis    Carpal tunnel syndrome    bilateral, had surgery on right hand   Diabetes mellitus without complication ([place])    Diverticulitis    Endometrial cancer ([place])    GERD (gastroesophageal reflux disease)    Gout    History of iron deficiency    Hyperlipidemia    Hypertension    Skin cancer     Past Surgical History:  Procedure Laterality Date   CARPAL TUNNEL RELEASE Right    COLON SURGERY     COLONOSCOPY     x2   COLOSTOMY     COLOSTOMY REVERSAL     DILATATION & CURETTAGE/HYSTEROSCOPY WITH MYOSURE N/A 07/17/2019   Procedure: DILATATION & CURETTAGE/HYSTEROSCOPY WITH MYOSURE;  Surgeon: Louretta Shorten, MD;  Location: [facility];  Service: Gynecology;  Laterality: N/A;   OOPHORECTOMY     unilateral   PILONIDAL  CYST EXCISION     ROBOTIC ASSISTED TOTAL HYSTERECTOMY WITH BILATERAL SALPINGO OOPHERECTOMY N/A 08/20/2019   Procedure: DIAGNSOTIC LAPAROSCOPY, DILATION AND CURETTAGE, IUD PLACEMENT ;  Surgeon: Everitt Amber, MD;  Location: [facility];  Service: Gynecology;  Laterality: N/A;   SKIN CANCER EXCISION      Allergies  Allergen Reactions   Actos [Pioglitazone]     Unknown reaction   Metformin    Lisinopril Cough   Metformin And Related     Gas     Outpatient Encounter Medications as of 08/07/2022  Medication Sig   allopurinol (ZYLOPRIM) 300 MG tablet Take 1 tablet (300 mg total) by mouth daily.   amLODipine (NORVASC) 5 MG tablet Take 1 tablet (5 mg total) by mouth daily.   atorvastatin (LIPITOR) 40 MG tablet Take 80 mg by mouth every evening.   bacitracin ointment Apply 1 Application topically 2 (two) times daily.   Carboxymethylcellul-Glycerin (LUBRICATING EYE DROPS OP) Place 1 drop into both eyes daily as needed (dry eyes).   carvedilol (COREG) 6.25 MG tablet Take 1 tablet (6.25 mg total) by mouth 2 (two) times daily with a meal.   Cholecalciferol (VITAMIN D3 PO) Take 1 capsule by mouth every evening.    Coenzyme Q10 (COQ10) 100 MG CAPS Take 100 mg by mouth  every evening.   diphenhydrAMINE (BENADRYL) 25 mg capsule Take 50 mg by mouth at bedtime.   famotidine (PEPCID) 20 MG tablet Take 20 mg by mouth daily.    furosemide (LASIX) 20 MG tablet Take 1 tablet (20 mg total) by mouth daily as needed.   GLUCOSAMINE-CHONDROITIN PO Take 1 tablet by mouth 2 (two) times daily.   insulin NPH-regular Human (70-30) 100 UNIT/ML injection Inject 75 Units into the skin 2 (two) times daily.    KRILL OIL PO Take 1 capsule by mouth daily.   losartan (COZAAR) 100 MG tablet Take 100 mg by mouth every evening.    meclizine (ANTIVERT) 12.5 MG tablet Take 1 tablet (12.5 mg total) by mouth 3 (three) times daily as needed for dizziness.   Multiple Vitamins-Minerals (MULTIVITAMIN WITH MINERALS) tablet Take 1 tablet by mouth  daily.   oxybutynin (DITROPAN-XL) 10 MG 24 hr tablet Take 1 tablet (10 mg total) by mouth at bedtime.   [DISCONTINUED] aspirin 325 MG tablet Take 325 mg by mouth daily.    [DISCONTINUED] doxycycline (VIBRAMYCIN) 100 MG capsule Take 1 capsule (100 mg total) by mouth 2 (two) times daily.   [DISCONTINUED] ibuprofen (ADVIL) 200 MG tablet Take 800 mg by mouth every 6 (six) hours as needed for headache or moderate pain.   [DISCONTINUED] ibuprofen (ADVIL) 600 MG tablet Take 1 tablet (600 mg total) by mouth every 6 (six) hours as needed for moderate pain. For AFTER surgery only   [DISCONTINUED] levonorgestrel (MIRENA) 20 MCG/24HR IUD 1 Intra Uterine Device (1 each total) by Intrauterine route once for 1 dose. [facility] on 8/20   No facility-administered encounter medications on file as of 08/07/2022.    Review of Systems:  Review of Systems  Constitutional:  Negative for appetite change, chills, fatigue and fever.  HENT:  Negative for congestion, hearing loss, rhinorrhea and sore throat.   Eyes: Negative.   Respiratory:  Positive for cough. Negative for shortness of breath and wheezing.        Dry cough mostly at night   Cardiovascular:  Negative for chest pain, palpitations and leg swelling.  Gastrointestinal:  Negative for abdominal pain, constipation, diarrhea, nausea and vomiting.  Genitourinary:  Negative for dysuria.  Musculoskeletal:  Negative for arthralgias, back pain and myalgias.  Skin:  Negative for color change, rash and wound.  Neurological:  Negative for dizziness, weakness and headaches.  Psychiatric/Behavioral:  Negative for behavioral problems. The patient is not nervous/anxious.     Health Maintenance  Topic Date Due   FOOT EXAM  Never done   OPHTHALMOLOGY EXAM  Never done   Hepatitis C Screening  Never done   Zoster Vaccines- Shingrix (1 of 2) Never done   COLONOSCOPY (Pts 45-70yrs Insurance coverage will need to be confirmed)  Never done   MAMMOGRAM   Never done   DEXA SCAN  Never done   COVID-19 Vaccine (3 - Pfizer risk series) 03/29/2020   INFLUENZA VACCINE  07/31/2022   HEMOGLOBIN A1C  01/23/2023   TETANUS/TDAP  06/21/2024   Pneumonia Vaccine 19+ Years old  Completed   HPV VACCINES  Aged Out    Physical Exam: Vitals:   08/07/22 1037  BP: 130/74  Pulse: 94  Resp: (!) 22  Temp: (!) 97.3 F (36.3 C)  TempSrc: Temporal  SpO2: 93%  Weight: (!) 340 lb (154.2 kg)  Height: 5' 4.5" (1.638 m)   Body mass index is 57.46 kg/m. Physical Exam Constitutional:  General: She is not in acute distress.    Appearance: She is obese.     Comments: Morbidly obese  HENT:     Head: Normocephalic and atraumatic.     Nose: Nose normal.     Mouth/Throat:     Mouth: Mucous membranes are moist.  Eyes:     Conjunctiva/sclera: Conjunctivae normal.  Cardiovascular:     Rate and Rhythm: Normal rate and regular rhythm.  Pulmonary:     Effort: Pulmonary effort is normal.     Breath sounds: Normal breath sounds.  Abdominal:     General: Bowel sounds are normal.     Palpations: Abdomen is soft.  Musculoskeletal:        General: Normal range of motion.     Cervical back: Normal range of motion.     Comments: Uses a motorized wheelchair  Skin:    General: Skin is warm and dry.     Comments: Bilateral lower legs covered with dressing  Neurological:     General: No focal deficit present.     Mental Status: She is alert and oriented to person, place, and time.  Psychiatric:        Mood and Affect: Mood normal.        Behavior: Behavior normal.        Thought Content: Thought content normal.        Judgment: Judgment normal.     Labs reviewed: Basic Metabolic Panel: Recent Labs    07/23/22 0000  NA 144  K 4.9  CL 107  CO2 31*  BUN 19  CREATININE 0.9  CALCIUM 9.7  TSH 6.84*   Liver Function Tests: Recent Labs    07/23/22 0000  AST 18  ALT 18  ALBUMIN 3.6   No results for input(s): "LIPASE", "AMYLASE" in the last 8760  hours. No results for input(s): "AMMONIA" in the last 8760 hours. CBC: Recent Labs    07/23/22 0000  WBC 6.0  HGB 9.8*  HCT 30*  PLT 332   Lipid Panel: Recent Labs    07/23/22 0000  CHOL 187  HDL 31*  LDLCALC 124  TRIG 177*   Lab Results  Component Value Date   HGBA1C 5.6 07/23/2022    Procedures since last visit: VAS [place] ABI WITH/WO TBI  Result Date: 08/06/2022  LOWER EXTREMITY DOPPLER STUDY Patient Name:  DEJANEE THIBEAUX  Date of Exam:   08/06/2022 Medical Rec #: [id number]    Accession #:    [id number] Date of Birth: Jul 29, 1950    Patient Gender: F Patient Age:   7 years Exam Location:  [facility] Procedure:      VAS [place] ABI WITH/WO TBI Referring Phys: JESSICA HOFFMAN --------------------------------------------------------------------------------  Indications: Peripheral artery disease, and wounds, weeping and brownish              discoloration to the bilateral lower legs. Patient reports pain in              both legs immediately upon walking. High Risk         Hypertension, hyperlipidemia, Diabetes, past history of Factors:          smoking.  Comparison Study: In 06/2022, in office ABIs performed showed .76 on the left,                   not assessed on the right. Performing Technologist: Sharlett Iles RVT  Examination Guidelines: A complete evaluation includes at minimum, Doppler waveform signals and systolic blood  pressure reading at the level of bilateral brachial, anterior tibial, and posterior tibial arteries, when vessel segments are accessible. Bilateral testing is considered an integral part of a complete examination. Photoelectric Plethysmograph (PPG) waveforms and toe systolic pressure readings are included as required and additional duplex testing as needed. Limited examinations for reoccurring indications may be performed as noted.  ABI Findings: +---------+------------------+-----+-----------+--------+ Right    Rt Pressure (mmHg)IndexWaveform   Comment   +---------+------------------+-----+-----------+--------+ Brachial 152                                        +---------+------------------+-----+-----------+--------+ PTA      130               0.86 multiphasic         +---------+------------------+-----+-----------+--------+ PERO     131               0.86 multiphasic         +---------+------------------+-----+-----------+--------+ DP       118               0.78 multiphasic         +---------+------------------+-----+-----------+--------+ Great Toe99                0.65 Abnormal            +---------+------------------+-----+-----------+--------+ +---------+------------------+-----+-----------+-------+ Left     Lt Pressure (mmHg)IndexWaveform   Comment +---------+------------------+-----+-----------+-------+ Brachial 148                                       +---------+------------------+-----+-----------+-------+ PTA      123               0.81 multiphasic        +---------+------------------+-----+-----------+-------+ PERO     124               0.82 multiphasic        +---------+------------------+-----+-----------+-------+ DP       121               0.80 multiphasic        +---------+------------------+-----+-----------+-------+ Great Toe90                0.59 Abnormal           +---------+------------------+-----+-----------+-------+ +-------+-----------+-----------+------------+------------+ ABI/TBIToday's ABIToday's TBIPrevious ABIPrevious TBI +-------+-----------+-----------+------------+------------+ Right  .86        .65        not assessed             +-------+-----------+-----------+------------+------------+ Left   .82        .59        .76                      +-------+-----------+-----------+------------+------------+  Summary: Right: Resting right ankle-brachial index indicates mild right lower extremity arterial disease. The right toe-brachial index is abnormal.  Left: Resting left ankle-brachial index indicates mild left lower extremity arterial disease. The left toe-brachial index is abnormal. *See table(s) above for measurements and observations.  Vascular consult recommended. Scheduled to see Dr. Gwenlyn Found on 08/13/2022. Electronically signed by Quay Burow MD on 08/06/2022 at 5:54:06 PM.    Final    VAS [place] LOWER EXTREMITY ARTERIAL DUPLEX  Result Date: 08/06/2022 LOWER EXTREMITY ARTERIAL DUPLEX STUDY Patient Name:  AGUSTINA WITZKE  Date of Exam:   08/06/2022 Medical Rec #: [id number]    Accession #:    [id number] Date of Birth: 04-27-1950    Patient Gender: F Patient Age:   6 years Exam Location:  [facility] Procedure:      VAS [place] LOWER EXTREMITY ARTERIAL DUPLEX Referring Phys: JESSICA HOFFMAN --------------------------------------------------------------------------------  Indications: Peripheral artery disease, and wounds, weeping and brownish              discoloration to the bilateral lower legs. Patient reports pain in              both legs immediately upon walking. High Risk Factors: Hypertension, hyperlipidemia, Diabetes, past history of                    smoking.  Current ABI: .86 on the right and .82 on the left Comparison Study: NA Performing Technologist: Sharlett Iles RVT  Examination Guidelines: A complete evaluation includes B-mode imaging, spectral Doppler, color Doppler, and power Doppler as needed of all accessible portions of each vessel. Bilateral testing is considered an integral part of a complete examination. Limited examinations for reoccurring indications may be performed as noted.  +-----------+--------+-----+--------------+-----------+------------------------+ RIGHT      PSV cm/sRatioStenosis      Waveform   Comments                 +-----------+--------+-----+--------------+-----------+------------------------+ CFA Prox   125                        triphasic                            +-----------+--------+-----+--------------+-----------+------------------------+ DFA        100                        biphasic                            +-----------+--------+-----+--------------+-----------+------------------------+ SFA Prox   101                        triphasic                           +-----------+--------+-----+--------------+-----------+------------------------+ SFA Mid    93                         triphasic                           +-----------+--------+-----+--------------+-----------+------------------------+ SFA Distal 138                        multiphasic                         +-----------+--------+-----+--------------+-----------+------------------------+ POP Prox   40                         monophasic                          +-----------+--------+-----+--------------+-----------+------------------------+ POP Mid    143     3.6  50-74%        multiphasicstenosis based  on VR 3.6                         stenosis                                          +-----------+--------+-----+--------------+-----------+------------------------+ POP Distal 191          30-49%        triphasic  high end range                                   stenosis                                          +-----------+--------+-----+--------------+-----------+------------------------+ TP Trunk   117                        multiphasic                         +-----------+--------+-----+--------------+-----------+------------------------+ ATA Prox   175          30-49%        multiphasic                                                 stenosis                                          +-----------+--------+-----+--------------+-----------+------------------------+ ATA Mid    159          30-49%        multiphasic                                                 stenosis                                           +-----------+--------+-----+--------------+-----------+------------------------+ ATA Distal 96                         triphasic                           +-----------+--------+-----+--------------+-----------+------------------------+ PTA Prox   118                        multiphasic                         +-----------+--------+-----+--------------+-----------+------------------------+ PTA Mid    195          30-49%        multiphasichigh end range  stenosis                                          +-----------+--------+-----+--------------+-----------+------------------------+ PTA Distal 88                         triphasic                           +-----------+--------+-----+--------------+-----------+------------------------+ PERO Prox  90                         triphasic                           +-----------+--------+-----+--------------+-----------+------------------------+ PERO Mid   165          30-49%        multiphasic                                                 stenosis                                          +-----------+--------+-----+--------------+-----------+------------------------+ PERO Distal136                        multiphasic                         +-----------+--------+-----+--------------+-----------+------------------------+ A focal velocity elevation of 143 cm/s was obtained at AK popliteal artery with a VR of 3.6. Findings are characteristic of 50-74% stenosis.  +-----------+--------+-----+---------------+-----------+--------------+ LEFT       PSV cm/sRatioStenosis       Waveform   Comments       +-----------+--------+-----+---------------+-----------+--------------+ CFA Prox   137                         multiphasic               +-----------+--------+-----+---------------+-----------+--------------+ DFA        51                          multiphasic                +-----------+--------+-----+---------------+-----------+--------------+ SFA Prox   119                         multiphasic               +-----------+--------+-----+---------------+-----------+--------------+ SFA Mid    197          30-49% stenosismultiphasichigh end range +-----------+--------+-----+---------------+-----------+--------------+ SFA Distal 173          30-49% stenosismultiphasic               +-----------+--------+-----+---------------+-----------+--------------+ POP Prox   92                          multiphasic               +-----------+--------+-----+---------------+-----------+--------------+  POP Mid    198          30-49% stenosismultiphasichigh end range +-----------+--------+-----+---------------+-----------+--------------+ POP Distal 186          30-49% stenosismultiphasichigh end range +-----------+--------+-----+---------------+-----------+--------------+ TP Trunk   162          30-49% stenosismultiphasic               +-----------+--------+-----+---------------+-----------+--------------+ ATA Prox   194          30-49% stenosismultiphasichigh end range +-----------+--------+-----+---------------+-----------+--------------+ ATA Mid    64                          biphasic                  +-----------+--------+-----+---------------+-----------+--------------+ ATA Distal 90                          multiphasic               +-----------+--------+-----+---------------+-----------+--------------+ PTA Prox   142                         triphasic                 +-----------+--------+-----+---------------+-----------+--------------+ PTA Mid    343     2.6  50-74% stenosismultiphasic               +-----------+--------+-----+---------------+-----------+--------------+ PTA Distal 76                          multiphasic               +-----------+--------+-----+---------------+-----------+--------------+  PERO Prox  78                          multiphasic               +-----------+--------+-----+---------------+-----------+--------------+ PERO Mid   173          30-49% stenosismultiphasic               +-----------+--------+-----+---------------+-----------+--------------+ PERO Distal76                          biphasic                  +-----------+--------+-----+---------------+-----------+--------------+ A focal velocity elevation of 343 cm/s was obtained at mid PTA with a VR of 2.6. Findings are characteristic of 50-74% stenosis.  Summary: Right: Heterogeneous plaque throughout. 50-74% stenosis in the above-the-knee popliteal artery, based on VR 3.6. 30-49% stenosis in the below-the-knee popliteal artery, high end range. Three vessel run-off; 30-49% stenosis in the proximal and mid ATA, 30-49% stenosis in the mid PTA, high end range, and 30-49% stenosis in the mid peroneal artery. Left: Heterogeneous plaque throughout. 30-49% stenosis in the mid and distal SFA, high end range in mid segment. 30-49% stenosis in the behind-and-below-the-knee popliteal artery, high end range. 30-49% stenosis in the TPT. Three vessel run-off; 30-49% stenosis in the proximal ATA, high end range, 50-74% stenosis in the mid PTA and 30-49% stenosis in the mid peroneal artery.  See table(s) above for measurements and observations. Vascular consult recommended. Scheduled to see Dr. Gwenlyn Found on 08/13/2022. Electronically signed by Quay Burow MD on 08/06/2022 at 5:53:52 PM.    Final    DG Chest 2  View  Result Date: 07/24/2022 CLINICAL DATA:  Acute shortness of breath and nonproductive cough for 3 weeks. EXAM: CHEST - 2 VIEW COMPARISON:  09/21/2019 FINDINGS: Technically suboptimal exam due to AP seated positioning. Mild cardiomegaly seen. Diffuse interstitial prominence suspicious for mild interstitial edema. Small bilateral pleural effusions are best seen on lateral projection. No evidence of pulmonary consolidation.  IMPRESSION: Technically suboptimal exam. Findings suspicious for mild congestive heart failure and small bilateral pleural effusions. Electronically Signed   By: Marlaine [name] M.D.   On: 07/24/2022 08:07    Assessment/Plan  1. Acute cough - probably due to seasonal allergies or GERD -  will need to monitor if it does not resolve in 2 weeks, then notify office  2. Chronic gout without tophus, unspecified cause, unspecified site -  continue Allopurinol - Uric Acid  3. Essential hypertension -  BP 130/74, improved -  continue Amlodipine and Coreg - BMP with eGFR([facility])  4. Diabetes mellitus with complication [facility]) Lab Results  Component Value Date   HGBA1C 5.6 07/23/2022   -  continue Insulin NPH-Regular Human 70-30 injection 70 units in the morning and 50 units in the evening -  monitor CBGs  5. Mixed hyperlipidemia Lab Results  Component Value Date   CHOL 187 07/23/2022   HDL 31 (A) 07/23/2022   LDLCALC 124 07/23/2022   TRIG 177 (A) 07/23/2022    - atorvastatin (LIPITOR) 80 MG tablet; Take 1 tablet (80 mg total) by mouth at bedtime.  Dispense: 90 tablet; Refill: 3  6. Congestive heart failure, unspecified HF chronicity, unspecified heart failure type ([place]) -  took Lasix 20 mg daily X 5 days - Brain Natriuretic Peptide and BMP today  7. GERD -  stable, continue Famotidine    Labs/tests ordered:  Uric acid, BMP and BNP  Next appt:  as needed, recommend 3 months

## 2022-08-07 NOTE — Patient Instructions (Signed)

## 2022-08-08 ENCOUNTER — Other Ambulatory Visit: Payer: Self-pay | Admitting: Adult Health

## 2022-08-08 DIAGNOSIS — I509 Heart failure, unspecified: Secondary | ICD-10-CM

## 2022-08-08 DIAGNOSIS — I10 Essential (primary) hypertension: Secondary | ICD-10-CM

## 2022-08-08 LAB — BRAIN NATRIURETIC PEPTIDE: Brain Natriuretic Peptide: 319 pg/mL — ABNORMAL HIGH (ref <100)

## 2022-08-08 LAB — BASIC METABOLIC PANEL WITH GFR
BUN/Creatinine Ratio: 15 (calc) (ref 6–22)
BUN: 16 mg/dL (ref 7–25)
CO2: 26 mmol/L (ref 20–32)
Calcium: 9.2 mg/dL (ref 8.6–10.4)
Chloride: 107 mmol/L (ref 98–110)
Creat: 1.06 mg/dL — ABNORMAL HIGH (ref 0.60–1.00)
Glucose, Bld: 143 mg/dL — ABNORMAL HIGH (ref 65–139)
Potassium: 4.6 mmol/L (ref 3.5–5.3)
Sodium: 142 mmol/L (ref 135–146)
eGFR: 55 mL/min/{1.73_m2} — ABNORMAL LOW (ref >=60)

## 2022-08-08 LAB — URIC ACID: Uric Acid, Serum: 6 mg/dL (ref 2.5–7.0)

## 2022-08-08 MED ORDER — FUROSEMIDE 20 MG PO TABS: 20.0000 mg | ORAL_TABLET | Freq: Every day | ORAL | 1 refills | Status: DC

## 2022-08-08 MED ORDER — FUROSEMIDE 20 MG PO TABS: 20.0000 mg | ORAL_TABLET | Freq: Every day | ORAL | 3 refills | Status: DC

## 2022-08-08 MED ORDER — POTASSIUM CHLORIDE CRYS ER 20 MEQ PO TBCR: 20.0000 meq | EXTENDED_RELEASE_TABLET | Freq: Every day | ORAL | 3 refills | Status: DC

## 2022-08-08 NOTE — Addendum Note (Signed)
Addended by: Durenda Age C on: 08/08/2022 04:59 PM   Modules accepted: Orders

## 2022-08-08 NOTE — Addendum Note (Signed)
Addended by: Casimer Leek C on: 08/08/2022 04:52 PM   Modules accepted: Orders

## 2022-08-08 NOTE — Progress Notes (Signed)
BNP is elevated at 319, normal is <100.  Continue taking Lasix 20 mg daily with K+ supplement. How much K was she taking? Does she have enough K+ supplement and Lasix. We can send Rx to pharmacy if needed. Does she have a cardiologist? BMP done yesterday was same as 2 years ago and uric acid is within normal.

## 2022-08-13 ENCOUNTER — Institutional Professional Consult (permissible substitution): Payer: Medicare Other | Admitting: Cardiovascular Disease

## 2022-08-13 ENCOUNTER — Encounter (HOSPITAL_BASED_OUTPATIENT_CLINIC_OR_DEPARTMENT_OTHER): Payer: Medicare Other | Admitting: Internal Medicine

## 2022-08-13 DIAGNOSIS — L97822 Non-pressure chronic ulcer of other part of left lower leg with fat layer exposed: Secondary | ICD-10-CM

## 2022-08-13 DIAGNOSIS — I87333 Chronic venous hypertension (idiopathic) with ulcer and inflammation of bilateral lower extremity: Secondary | ICD-10-CM

## 2022-08-13 DIAGNOSIS — I890 Lymphedema, not elsewhere classified: Secondary | ICD-10-CM | POA: Diagnosis not present

## 2022-08-13 DIAGNOSIS — L03116 Cellulitis of left lower limb: Secondary | ICD-10-CM | POA: Diagnosis not present

## 2022-08-13 DIAGNOSIS — L03115 Cellulitis of right lower limb: Secondary | ICD-10-CM | POA: Diagnosis not present

## 2022-08-13 DIAGNOSIS — E11622 Type 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: Secondary | ICD-10-CM | POA: Diagnosis not present

## 2022-08-13 DIAGNOSIS — L97812 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d: Secondary | ICD-10-CM

## 2022-08-15 ENCOUNTER — Encounter: Payer: Self-pay | Admitting: Adult Health

## 2022-08-15 NOTE — Telephone Encounter (Signed)
Message routed to PCP Medina-Vargas, Monina C, NP  

## 2022-08-17 ENCOUNTER — Other Ambulatory Visit: Payer: Self-pay | Admitting: Adult Health

## 2022-08-17 MED ORDER — BENZONATATE 100 MG PO CAPS: 100.0000 mg | ORAL_CAPSULE | Freq: Three times a day (TID) | ORAL | 0 refills | Status: DC | PRN

## 2022-08-17 NOTE — Progress Notes (Signed)
AMENDA, DUCLOS ([id number]) Visit Report for 08/13/2022 Chief Complaint Document Details Patient Name: Date of Service: [name], [name] [name] 08/13/2022 12:45 PM Medical Record Number: [id number] Patient Account Number: [id number] Date of Birth/Sex: Treating RN: 1950/06/08 (72 y.o. F) Primary Care Provider: [name], [name] Other Clinician: Referring Provider: Treating Provider/Extender: [name] [name], [name] Weeks in Treatment: 3 Information Obtained from: Patient Chief Complaint 07/19/2022; bilateral lower extremity wounds Electronic Signature(s) Signed: 08/13/2022 3:35:29 PM By: [name] DO Entered By: [name] on 08/13/2022 13:08:34 -------------------------------------------------------------------------------- HPI Details Patient Name: Date of Service: [name], [name] 08/13/2022 12:45 PM Medical Record Number: [id number] Patient Account Number: [id number] Date of Birth/Sex: Treating RN: June 14, 1950 (72 y.o. F) Primary Care Provider: [name], [name] Other Clinician: Referring Provider: Treating Provider/Extender: [name] [name], [name] Weeks in Treatment: 3 History of Present Illness HPI Description: Admission 07/19/2022 [name] Is a 72 year old female with a past medical history of endometrial cancer, lymphedema, insulin-dependent type 2 diabetes that presents to the clinic for a 4-week history of weeping to her lower extremities bilaterally. She visited the ED for this issue on 07/07/2022 and she was prescribed doxycycline For bilateral lower extremity cellulitis. She has completed this course. She currently denies signs of infection. She does not have compression stockings. She states she recently gained weight over the past couple months. She has never had wounds before. 7/28; patient presents for follow-up. We have been using silver alginate under Kerlix/Coban. She is scheduled to get her ABIs with TBI's on  8/7. She states that the wraps slid down to her mid shin. She denies signs of infection. 8/7; patient presents for follow-up. We have been using silver alginate under Kerlix/Coban. She had arterial studies done that showed an ABI of 0.86 on the right and 0.82 on the left with multiphasic waveforms throughout the lower extremities. 8/14; patient presents for follow-up. We have been using silver alginate under 3 layer compression. She tolerated the wrap well. She has no issues or complaints today. Electronic Signature(s) Signed: 08/13/2022 3:35:29 PM By: [name] DO Entered By: [name] on 08/13/2022 13:09:08 -------------------------------------------------------------------------------- Physical Exam Details Patient Name: Date of Service: [name] 08/13/2022 12:45 PM Medical Record Number: [id number] Patient Account Number: [id number] Date of Birth/Sex: Treating RN: [birth year] (72 y.o. F) Primary Care Provider: [name], [name] Other Clinician: Referring Provider: Treating Provider/Extender: [name] [name], [name] Weeks in Treatment: 3 Constitutional respirations regular, non-labored and within target range for patient.. Cardiovascular 2+ dorsalis pedis/posterior tibialis pulses. Psychiatric pleasant and cooperative. Notes Scattered wounds to the lower extremities bilaterally. Minimal weeping noted. 2+ pitting edema to the thigh. Lymphedema skin changes. Electronic Signature(s) Signed: 08/13/2022 3:35:29 PM By: [name] DO Entered By: [name] on 08/13/2022 13:10:02 -------------------------------------------------------------------------------- Physician Orders Details Patient Name: Date of Service: [name], [name] 08/13/2022 12:45 PM Medical Record Number: [id number] Patient Account Number: [id number] Date of Birth/Sex: Treating RN: January 08, 1950 (72 y.o. [name] Primary Care Provider: [name], [name]  Other Clinician: Referring Provider: Treating Provider/Extender: [name] [name], [name] Weeks in Treatment: 3 Verbal / Phone Orders: No Diagnosis Coding ICD-10 Coding Code Description [id number] 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d L97.822 Non-pressure chronic ulcer of other part of left lower leg with fat layer exposed I89.0 Lymphedema, not elsewhere  classified I87.333 Chronic venous hypertension (idiopathic) with ulcer and inflammation of bilateral lower extremity E11.622 Type 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 Follow-up Appointments ppointment in 1 week. - Dr. Heber [place] and [place], Room 8 1245 08/20/2022 Monday Return A ppointment in 2 weeks. - Dr. Heber [place] and [place], Room 8 1245 08/27/2022 Monday Return A Other: - Bring in juxtalites HD weekly in case wound closure. Anesthetic Wound #2 Right,Circumferential Lower Leg (In clinic) Topical Lidocaine 5% applied to wound bed Bathing/ Shower/ Hygiene May shower with protection but do not get wound dressing(s) wet. Edema Control - Lymphedema / SCD / Other Lymphedema Pumps. Use Lymphedema pumps on leg(s) 2-3 times a day for 45-60 minutes. If wearing any wraps or hose, do not remove them. Continue exercising as instructed. - 2-3 times a day throughout the day. Elevate legs to the level of the heart or above for 30 minutes daily and/or when sitting, a frequency of: - 3-4 times a day throughout the day. Avoid standing for long periods of time. Exercise regularly Wound Treatment Wound #1 - Lower Leg Wound Laterality: Left, Circumferential Peri-Wound Care: Triamcinolone 15 (g) 1 x Per Week/30 Days Discharge Instructions: Use triamcinolone 15 (g) as directed Peri-Wound Care: Sween Lotion (Moisturizing lotion) 1 x Per Week/30 Days Discharge Instructions: Apply moisturizing lotion as directed Prim Dressing: KerraCel Ag Gelling Fiber  Dressing, 4x5 in (silver alginate) 1 x Per Week/30 Days ary Discharge Instructions: Apply silver alginate to wound bed as instructed Secondary Dressing: ABD Pad, 8x10 1 x Per Week/30 Days Discharge Instructions: Apply over primary dressing as directed. Compression Wrap: ThreePress (3 layer compression wrap) 1 x Per Week/30 Days Discharge Instructions: Apply three layer compression as directed. Compression Wrap: unna boot first layer 1 x Per Week/30 Days Discharge Instructions: apply unna boot first layer at upper portion of lower leg to secure wrap in place. Wound #2 - Lower Leg Wound Laterality: Right, Circumferential Peri-Wound Care: Triamcinolone 15 (g) 1 x Per Week/30 Days Discharge Instructions: Use triamcinolone 15 (g) as directed Peri-Wound Care: Sween Lotion (Moisturizing lotion) 1 x Per Week/30 Days Discharge Instructions: Apply moisturizing lotion as directed Prim Dressing: KerraCel Ag Gelling Fiber Dressing, 4x5 in (silver alginate) 1 x Per Week/30 Days ary Discharge Instructions: Apply silver alginate to wound bed as instructed Secondary Dressing: ABD Pad, 8x10 1 x Per Week/30 Days Discharge Instructions: Apply over primary dressing as directed. Compression Wrap: ThreePress (3 layer compression wrap) 1 x Per Week/30 Days Discharge Instructions: Apply three layer compression as directed. Compression Wrap: unna boot first layer 1 x Per Week/30 Days Discharge Instructions: apply unna boot first layer at upper portion of lower leg to secure wrap in place. Patient Medications llergies: Actos, metformin, lisinopril A Notifications Medication Indication Start End lidocaine DOSE topical 5 % gel - gel topical applied before debridements at clinic appts. Electronic Signature(s) Signed: 08/13/2022 3:35:29 PM By: [name] DO Entered By: [name] on 08/13/2022 13:10:11 Prescription  08/13/2022 -------------------------------------------------------------------------------- Thayer Dallas DO Patient Name: Provider: Jan 08, 1950 [id number] Date of Birth: NPI#Wanda Plump [id number] Sex: DEA #: [phone] Phone #: License #: [place] Patient Address: [address], [place] [address] [place], [place] [address] [phone] Allergies Actos; metformin; lisinopril Medication Medication: Route: Strength: Form: lidocaine 5 % topical gel topical 5% gel Class: TOPICAL LOCAL ANESTHETICS Dose: Frequency / Time: Indication: gel topical applied before debridements at clinic appts. Number of  Refills: Number of Units: 0 Generic Substitution: Start Date: End Date: One Time Use: Substitution Permitted No Note to Pharmacy: Hand Signature: Date(s): Electronic Signature(s) Signed: 08/13/2022 3:35:29 PM By: [name] DO Entered By: [name] on 08/13/2022 13:10:11 -------------------------------------------------------------------------------- Problem List Details Patient Name: Date of Service: [name] 08/13/2022 12:45 PM Medical Record Number: [id number] Patient Account Number: [id number] Date of Birth/Sex: Treating RN: 11-23-1950 (72 y.o. [name], [name] Primary Care Provider: [name] [name] Other Clinician: Referring Provider: Treating Provider/Extender: [name] [name] [name] Weeks in Treatment: 3 Active Problems ICD-10 Encounter Code Description Active Date MDM Diagnosis L97.812 Non-pressure chronic ulcer of other part of right lower leg with fat layer 07/19/2022 No Yes exposed L97.822 Non-pressure chronic ulcer of other part of left lower leg with fat layer exposed7/20/2023 No Yes I89.0 Lymphedema, not elsewhere classified 07/19/2022 No Yes I87.333 Chronic venous hypertension (idiopathic) with ulcer and  inflammation of 07/19/2022 No Yes bilateral lower extremity E11.622 Type 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 07/19/2022 No Yes 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 07/19/2022 No Yes Inactive Problems Resolved Problems Electronic Signature(s) Signed: 08/13/2022 3:35:29 PM By: [name] DO Entered By: [name] on 08/13/2022 13:08:15 -------------------------------------------------------------------------------- Progress Note Details Patient Name: Date of Service: [name] [place], [place] RIA [name] 08/13/2022 12:45 PM Medical Record Number: [id number] Patient Account Number: [id number] Date of Birth/Sex: Treating RN: 1950-05-08 (72 y.o. F) Primary Care Provider: [name], [name] Other Clinician: Referring Provider: Treating Provider/Extender: [name] [name], [name] Weeks in Treatment: 3 Subjective Chief Complaint Information obtained from Patient 07/19/2022; bilateral lower extremity wounds History of Present Illness (HPI) Admission 07/19/2022 Ms. Shadana Pry Is a 72 year old female with a past medical history of endometrial cancer, lymphedema, insulin-dependent type 2 diabetes that presents to the clinic for a 4-week history of weeping to her lower extremities bilaterally. She visited the ED for this issue on 07/07/2022 and she was prescribed doxycycline For bilateral lower extremity cellulitis. She has completed this course. She currently denies signs of infection. She does not have compression stockings. She states she recently gained weight over the past couple months. She has never had wounds before. 7/28; patient presents for follow-up. We have been using silver alginate under Kerlix/Coban. She is scheduled to get her ABIs with TBI's on 8/7. She states that the wraps slid down to her mid shin. She denies signs of infection. 8/7; patient presents for follow-up. We have been using silver alginate under Kerlix/Coban. She had arterial studies done that showed an  ABI of 0.86 on the right and 0.82 on the left with multiphasic waveforms throughout the lower extremities. 8/14; patient presents for follow-up. We have been using silver alginate under 3 layer compression. She tolerated the wrap well. She has no issues or complaints today. Patient History Information obtained from Patient, Chart. Family History Cancer - Father, Diabetes - Siblings. Social History Former smoker - quit 1988, Marital Status - Married, Alcohol Use - Moderate - cocktails x2 a week, Drug Use - No History, Caffeine Use - Never. Medical History Hematologic/Lymphatic Patient has history of Anemia - iron Cardiovascular Patient has history of Hypertension Endocrine Patient has history of Type II Diabetes Musculoskeletal Patient has history of Gout, Osteoarthritis Oncologic Patient has history of Received Radiation - 2023 Hospitalization/Surgery History - one ovary removed 2020. - pilonidal cyst extraction 1969. - diverculitis temp colostomy. Medical A Surgical History Notes nd Cardiovascular hyperlipidemia Gastrointestinal GERD Diverticulitis Musculoskeletal  carpel tunnel bilateral-surgery to right hand Oncologic endometrial cancer skin Ca Objective Constitutional respirations regular, non-labored and within target range for patient.. Vitals Time Taken: 12:49 PM, Height: 64 in, Weight: 325 lbs, BMI: 55.8, Temperature: 99.1 F, Pulse: 86 bpm, Respiratory Rate: 20 breaths/min, Blood Pressure: 148/69 mmHg, Capillary Blood Glucose: 190 mg/dl. Cardiovascular 2+ dorsalis pedis/posterior tibialis pulses. Psychiatric pleasant and cooperative. General Notes: Scattered wounds to the lower extremities bilaterally. Minimal weeping noted. 2+ pitting edema to the thigh. Lymphedema skin changes. Integumentary (Hair, Skin) Wound #1 status is Open. Original cause of wound was Gradually Appeared. The date acquired was: 07/05/2022. The wound has been in treatment 3 weeks. The wound is  located on the Left,Circumferential Lower Leg. The wound measures 3cm length x 20cm width x 0.1cm depth; 47.124cm^2 area and 4.712cm^3 volume. There is Fat Layer (Subcutaneous Tissue) exposed. There is no tunneling or undermining noted. There is a large amount of serosanguineous drainage noted. The wound margin is distinct with the outline attached to the wound base. There is large (67-100%) red, pink granulation within the wound bed. There is a small (1-33%) amount of necrotic tissue within the wound bed including Adherent Slough. Wound #2 status is Open. Original cause of wound was Gradually Appeared. The date acquired was: 07/05/2022. The wound has been in treatment 3 weeks. The wound is located on the Right,Circumferential Lower Leg. The wound measures 12cm length x 24cm width x 0.1cm depth; 226.195cm^2 area and 22.619cm^3 volume. There is Fat Layer (Subcutaneous Tissue) exposed. There is no tunneling or undermining noted. There is a large amount of serosanguineous drainage noted. The wound margin is distinct with the outline attached to the wound base. There is large (67-100%) red, pink granulation within the wound bed. There is a small (1-33%) amount of necrotic tissue within the wound bed including Adherent Slough. Assessment Active Problems ICD-10 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d Non-pressure chronic ulcer of other part of left lower leg with fat layer exposed Lymphedema, not elsewhere classified Chronic venous hypertension (idiopathic) with ulcer and inflammation of bilateral lower extremity Type 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 Malignant neoplasm of endometrium Patient's wounds appear well-healing. I recommended continuing with silver alginate under 3 layer compression. She would benefit from lymphedema pumps and I offered to order these. She would like to hold off for now. She states that her husband has a set and she would like  to try his first before ordering. Follow-up in 1 week. Procedures Wound #1 Pre-procedure diagnosis of Wound #1 is a Lymphedema located on the Left,Circumferential Lower Leg . There was a Three Layer Compression Therapy Procedure by [name], RN. Post procedure Diagnosis Wound #1: Same as Pre-Procedure Wound #2 Pre-procedure diagnosis of Wound #2 is a Lymphedema located on the Right,Circumferential Lower Leg . There was a Three Layer Compression Therapy Procedure by [name], RN. Post procedure Diagnosis Wound #2: Same as Pre-Procedure Plan Follow-up Appointments: Return Appointment in 1 week. - Dr. Heber [place] and [place], Room 8 1245 08/20/2022 Monday Return Appointment in 2 weeks. - Dr. Heber [place] and [place], Room 8 1245 08/27/2022 Monday Other: - Bring in juxtalites HD weekly in case wound closure. Anesthetic: Wound #2 Right,Circumferential Lower Leg: (In clinic) Topical Lidocaine 5% applied to wound bed Bathing/ Shower/ Hygiene: May shower with protection but do not get wound dressing(s) wet. Edema Control - Lymphedema / SCD / Other: Lymphedema Pumps. Use Lymphedema pumps on leg(s) 2-3 times a day for  45-60 minutes. If wearing any wraps or hose, do not remove them. Continue exercising as instructed. - 2-3 times a day throughout the day. Elevate legs to the level of the heart or above for 30 minutes daily and/or when sitting, a frequency of: - 3-4 times a day throughout the day. Avoid standing for long periods of time. Exercise regularly The following medication(s) was prescribed: lidocaine topical 5 % gel gel topical applied before debridements at clinic appts. was prescribed at facility WOUND #1: - Lower Leg Wound Laterality: Left, Circumferential Peri-Wound Care: Triamcinolone 15 (g) 1 x Per Week/30 Days Discharge Instructions: Use triamcinolone 15 (g) as directed Peri-Wound Care: Sween Lotion (Moisturizing lotion) 1 x Per Week/30 Days Discharge Instructions: Apply moisturizing  lotion as directed Prim Dressing: KerraCel Ag Gelling Fiber Dressing, 4x5 in (silver alginate) 1 x Per Week/30 Days ary Discharge Instructions: Apply silver alginate to wound bed as instructed Secondary Dressing: ABD Pad, 8x10 1 x Per Week/30 Days Discharge Instructions: Apply over primary dressing as directed. Com pression Wrap: ThreePress (3 layer compression wrap) 1 x Per Week/30 Days Discharge Instructions: Apply three layer compression as directed. Com pression Wrap: unna boot first layer 1 x Per Week/30 Days Discharge Instructions: apply unna boot first layer at upper portion of lower leg to secure wrap in place. WOUND #2: - Lower Leg Wound Laterality: Right, Circumferential Peri-Wound Care: Triamcinolone 15 (g) 1 x Per Week/30 Days Discharge Instructions: Use triamcinolone 15 (g) as directed Peri-Wound Care: Sween Lotion (Moisturizing lotion) 1 x Per Week/30 Days Discharge Instructions: Apply moisturizing lotion as directed Prim Dressing: KerraCel Ag Gelling Fiber Dressing, 4x5 in (silver alginate) 1 x Per Week/30 Days ary Discharge Instructions: Apply silver alginate to wound bed as instructed Secondary Dressing: ABD Pad, 8x10 1 x Per Week/30 Days Discharge Instructions: Apply over primary dressing as directed. Com pression Wrap: ThreePress (3 layer compression wrap) 1 x Per Week/30 Days Discharge Instructions: Apply three layer compression as directed. Com pression Wrap: unna boot first layer 1 x Per Week/30 Days Discharge Instructions: apply unna boot first layer at upper portion of lower leg to secure wrap in place. 1. Silver alginate under 3 layer compression 2. Follow-up in 1 week Electronic Signature(s) Signed: 08/13/2022 3:35:29 PM By: [name] DO Entered By: [name] on 08/13/2022 13:13:24 -------------------------------------------------------------------------------- HxROS Details Patient Name: Date of Service: [name], [place] RIA [name] 08/13/2022 12:45  PM Medical Record Number: [id number] Patient Account Number: [id number] Date of Birth/Sex: Treating RN: 03-Oct-1950 (72 y.o. F) Primary Care Provider: [name] Other Clinician: Referring Provider: Treating Provider/Extender: [name] [name], [name] Weeks in Treatment: 3 Information Obtained From Patient Chart Hematologic/Lymphatic Medical History: Positive for: Anemia - iron Cardiovascular Medical History: Positive for: Hypertension Past Medical History Notes: hyperlipidemia Gastrointestinal Medical History: Past Medical History Notes: GERD Diverticulitis Endocrine Medical History: Positive for: Type II Diabetes Time with diabetes: 72 years old Treated with: Insulin, Diet Blood sugar tested every day: Yes Tested : three times a week. Musculoskeletal Medical History: Positive for: Gout; Osteoarthritis Past Medical History Notes: carpel tunnel bilateral-surgery to right hand Oncologic Medical History: Positive for: Received Radiation - 2023 Past Medical History Notes: endometrial cancer skin Ca Immunizations Pneumococcal Vaccine: Received Pneumococcal Vaccination: Yes Received Pneumococcal Vaccination On or After 60th Birthday: No Implantable Devices No devices added Hospitalization / Surgery History Type of Hospitalization/Surgery one ovary removed 2020 pilonidal cyst extraction 1969 diverculitis temp colostomy Family and Social History Cancer: Yes - Father; Diabetes:  Yes - Siblings; Former smoker - quit 1988; Marital Status - Married; Alcohol Use: Moderate - cocktails x2 a week; Drug Use: No History; Caffeine Use: Never; Financial Concerns: No; Food, Clothing or Shelter Needs: No; Support System Lacking: No; Transportation Concerns: No Electronic Signature(s) Signed: 08/13/2022 3:35:29 PM By: [name] DO Entered By: [name] on 08/13/2022  13:09:14 -------------------------------------------------------------------------------- SuperBill Details Patient Name: Date of Service: [name], [place] 08/13/2022 Medical Record Number: [id number] Patient Account Number: [id number] Date of Birth/Sex: Treating RN: 27-Dec-1950 (72 y.o. [name] Primary Care Provider: [name], [name] Other Clinician: Referring Provider: Treating Provider/Extender: [name] [name], [name] Weeks in Treatment: 3 Diagnosis Coding ICD-10 Codes Code Description [id number] 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d L97.822 Non-pressure chronic ulcer of other part of left lower leg with fat layer exposed I89.0 Lymphedema, not elsewhere classified I87.333 Chronic venous hypertension (idiopathic) with ulcer and inflammation of bilateral lower extremity E11.622 Type 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 Facility Procedures CPT4: Code [id number] Description: 81 BILATERAL: Application of multi-layer venous compression system; leg (below knee), including ankle and t. Modifier: Quantity: 1 Physician Procedures : CPT4 Code Description Modifier [id number] - WC PHYS LEVEL 3 - EST PT ICD-10 Diagnosis Description K32.761 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d L97.822 Non-pressure chronic ulcer of other part of left  lower leg with fat layer exposed I89.0 Lymphedema, not elsewhere classified I87.333 Chronic venous hypertension (idiopathic) with ulcer and inflammation of bilateral lower extremity Quantity: 1 Electronic Signature(s) Signed: 08/13/2022 3:35:29 PM By: [name] DO Entered By: [name] on 08/13/2022 13:13:45

## 2022-08-17 NOTE — Progress Notes (Signed)
[name], [name] ([id number]) Visit Report for 08/13/2022 Arrival Information Details Patient Name: Date of Service: [name], [name] 08/13/2022 12:45 PM Medical Record Number: [id number] Patient Account Number: [id number] Date of Birth/Sex: Treating RN: [birth year] (72 y.o. F) Primary Care [name]: [name] [name]: Treating [name]/Extender: [name] [name] Weeks in Treatment: 3 Visit Information History Since Last Visit Added or deleted any medications: No Patient Arrived: Wheel Chair Any new allergies or adverse reactions: No Arrival Time: 12:49 Had a fall or experienced change in No Accompanied By: self activities of daily living that may affect Transfer Assistance: None risk of falls: Patient Identification Verified: Yes Signs or symptoms of abuse/neglect since last visito No Secondary Verification Process Completed: Yes Hospitalized since last visit: No Patient Requires Transmission-Based No Implantable device outside of the clinic excluding No Precautions: cellular tissue based products placed in the center Patient Has Alerts: Yes since last visit: Patient Alerts: 08/06/2022 TBI L 0.59 R0.65 Has Dressing in Place as Prescribed: Yes 08/06/2022 ABI R 0.86 Has Compression in Place as Prescribed: Yes L0.82 Pain Present Now: No Electronic Signature(s) Signed: 08/13/2022 3:10:00 PM By: [name] Entered By: [name] on 08/13/2022 12:49:48 -------------------------------------------------------------------------------- Compression Therapy Details Patient Name: Date of Service: [name] 08/13/2022 12:45 PM Medical Record Number: [id number] Patient Account Number: [id number] Date of Birth/Sex: Treating RN: Feb 26, 1950 (72 y.o. [name] Primary Care [name]: [name] [name]: Treating [name]/Extender: [name] [name] A RGA S, [place]  [name] Weeks in Treatment: 3 Compression Therapy Performed for Wound Assessment: Wound #1 Left,Circumferential Lower Leg Performed By: Clinician [name] Pilling, RN Compression Type: Three Layer Post Procedure Diagnosis Same as Pre-procedure Electronic Signature(s) Signed: 08/13/2022 4:35:20 PM By: [name] Pilling RN, BSN Entered By: [name] on 08/13/2022 13:03:25 -------------------------------------------------------------------------------- Compression Therapy Details Patient Name: Date of Service: [name] 08/13/2022 12:45 PM Medical Record Number: [id number] Patient Account Number: [id number] Date of Birth/Sex: Treating RN: September 29, 1950 (72 y.o. [name] Primary Care Viva Gallaher: [name] [name]: Treating [name]/Extender: [name] [name] Weeks in Treatment: 3 Compression Therapy Performed for Wound Assessment: Wound #2 Right,Circumferential Lower Leg Performed By: Clinician [name] Pilling, RN Compression Type: Three Layer Post Procedure Diagnosis Same as Pre-procedure Electronic Signature(s) Signed: 08/13/2022 4:35:20 PM By: [name] Pilling RN, BSN Entered By: [name] on 08/13/2022 13:03:25 -------------------------------------------------------------------------------- Encounter Discharge Information Details Patient Name: Date of Service: [name], [name] 08/13/2022 12:45 PM Medical Record Number: [id number] Patient Account Number: [id number] Date of Birth/Sex: Treating RN: 04-24-1950 (72 y.o. [name] Primary Care Kristi Hyer: [name] [name]: Treating [name] [name] Weeks in Treatment: 3 Encounter Discharge Information Items Discharge Condition: Stable Ambulatory Status: Wheelchair Discharge Destination: Home Transportation: Private Auto Accompanied By: self Schedule Follow-up  Appointment: Yes Clinical Summary of Care: Electronic Signature(s) Signed: 08/13/2022 4:35:20 PM By: [name] Pilling RN, BSN Entered By: [name] on 08/13/2022 13:05:52 -------------------------------------------------------------------------------- Lower Extremity Assessment Details Patient Name: Date of Service: [name], [name] 08/13/2022 12:45 PM Medical Record Number: [id number] Patient Account Number: [id number] Date of Birth/Sex: Treating RN: 1950-02-18 (72 y.o. F) Primary Care [name]: [name] [name] Clinician: Referring [name]: Treating Kerim Statzer/Extender: [name] [name] Weeks in Treatment: 3 Edema Assessment  Assessed: [Left: No] [Right: No] Edema: [Left: Yes] [Right: Yes] Calf Left: Right: Point of Measurement: 39 cm From Medial Instep 51 cm 51 cm Ankle Left: Right: Point of Measurement: 10 cm From Medial Instep 27 cm 31 cm Vascular Assessment Pulses: Dorsalis Pedis Palpable: [Left:Yes] [Right:Yes] Electronic Signature(s) Signed: 08/13/2022 3:10:00 PM By: [name] Entered By: [name] on 08/13/2022 12:51:09 -------------------------------------------------------------------------------- Multi Wound Chart Details Patient Name: Date of Service: [name] 08/13/2022 12:45 PM Medical Record Number: [id number] Patient Account Number: [id number] Date of Birth/Sex: Treating RN: [birth year] (72 y.o. F) Primary Care [name]: [name] [name] Clinician: Referring [name]: Treating Carl Butner/Extender: [name] [name] Weeks in Treatment: 3 Vital Signs Height(in): 64 Capillary Blood Glucose(mg/dl): 190 Weight(lbs): 325 Pulse(bpm): 6 Body Mass Index(BMI): 55.8 Blood Pressure(mmHg): 148/69 Temperature(F): 99.1 Respiratory Rate(breaths/min): 20 Photos: [[name]:[name]] Left, Circumferential Lower Leg Right, Circumferential Lower Leg [name] Wound Location: Gradually  Appeared Gradually Appeared [name] Wounding Event: Lymphedema Lymphedema [name] Primary Etiology: Anemia, Hypertension, Type II Anemia, Hypertension, Type II [name] Comorbid History: Diabetes, Gout, Osteoarthritis, Diabetes, Gout, Osteoarthritis, Received Radiation Received Radiation 07/05/2022 07/05/2022 [name] Date Acquired: 3 3 [name] Weeks of Treatment: Open Open [name] Wound Status: No No [name] Wound Recurrence: Yes Yes [name] Clustered Wound: 3 3 [name] Clustered Quantity: 3x20x0.1 12x24x0.1 [name] Measurements L x W x D (cm) 47.124 226.195 [name] A (cm) : rea 4.712 22.619 [name] Volume (cm) : 88.10% 41.70% [name] % Reduction in Area: 88.10% 41.70% [name] % Reduction in Volume: Full Thickness Without Exposed Full Thickness Without Exposed [name] Classification: Support Structures Support Structures Large Large [name] Exudate Amount: Serosanguineous Serosanguineous [name] Exudate Type: red, brown red, brown [name] Exudate Color: Distinct, outline attached Distinct, outline attached [name] Wound Margin: Large (67-100%) Large (67-100%) [name] Granulation Amount: Red, Pink Red, Pink [name] Granulation Quality: Small (1-33%) Small (1-33%) [name] Necrotic Amount: Fat Layer (Subcutaneous Tissue): Yes Fat Layer (Subcutaneous Tissue): Yes [name] Exposed Structures: Fascia: No Fascia: No Tendon: No Tendon: No Muscle: No Muscle: No Joint: No Joint: No Bone: No Bone: No Large (67-100%) Large (67-100%) [name] Epithelialization: Compression Therapy Compression Therapy [name] Procedures Performed: Treatment Notes Wound #1 (Lower Leg) Wound Laterality: Left, Circumferential Cleanser Peri-Wound Care Triamcinolone 15 (g) Discharge Instruction: Use triamcinolone 15 (g) as directed Sween Lotion (Moisturizing lotion) Discharge Instruction: Apply moisturizing lotion as directed Topical Primary Dressing KerraCel Ag Gelling Fiber Dressing, 4x5 in (silver alginate) Discharge Instruction: Apply silver alginate to wound bed as  instructed Secondary Dressing ABD Pad, 8x10 Discharge Instruction: Apply over primary dressing as directed. Secured With Compression Wrap ThreePress (3 layer compression wrap) Discharge Instruction: Apply three layer compression as directed. unna boot first layer Discharge Instruction: apply unna boot first layer at upper portion of lower leg to secure wrap in place. Compression Stockings Add-Ons Wound #2 (Lower Leg) Wound Laterality: Right, Circumferential Cleanser Peri-Wound Care Triamcinolone 15 (g) Discharge Instruction: Use triamcinolone 15 (g) as directed Sween Lotion (Moisturizing lotion) Discharge Instruction: Apply moisturizing lotion as directed Topical Primary Dressing KerraCel Ag Gelling Fiber Dressing, 4x5 in (silver alginate) Discharge Instruction: Apply silver alginate to wound bed as instructed Secondary Dressing ABD Pad, 8x10 Discharge Instruction: Apply over primary dressing as directed. Secured With Compression Wrap ThreePress (3 layer compression wrap) Discharge Instruction: Apply three layer compression as directed. unna boot first layer Discharge Instruction: apply unna boot first layer at upper portion of lower leg to secure wrap in place. Compression Stockings Add-Ons Electronic Signature(s) Signed: 08/13/2022 3:35:29 PM  By: [name] Shan DO Entered By: [name] on 08/13/2022 13:08:20 -------------------------------------------------------------------------------- Multi-Disciplinary Care Plan Details Patient Name: Date of Service: [name], [name] 08/13/2022 12:45 PM Medical Record Number: [id number] Patient Account Number: [id number] Date of Birth/Sex: Treating RN: Apr 07, 1950 (72 y.o. [name] Primary Care Jawon Dipiero: [name] [name] Clinician: Referring Nikolus Marczak: Treating Kaydan Wilhoite/Extender: [name] [name] Weeks in Treatment: 3 Active Inactive Pain, Acute or Chronic Nursing  Diagnoses: Pain, acute or chronic: actual or potential Potential alteration in comfort, pain Goals: Patient will verbalize adequate pain control and receive pain control interventions during procedures as needed Date Initiated: 07/19/2022 Target Resolution Date: 08/31/2022 Goal Status: Active Patient/caregiver will verbalize comfort level met Date Initiated: 07/19/2022 Date Inactivated: 08/06/2022 Target Resolution Date: 08/06/2022 Goal Status: Met Interventions: Encourage patient to take pain medications as prescribed Provide education on pain management Reposition patient for comfort Treatment Activities: Administer pain control measures as ordered : 07/19/2022 Notes: Venous Leg Ulcer Nursing Diagnoses: Actual venous Insuffiency (use after diagnosis is confirmed) Potential for venous Insuffiency (use before diagnosis confirmed) Goals: Non-invasive venous studies are completed as ordered Date Initiated: 07/19/2022 Target Resolution Date: 08/31/2022 Goal Status: Active Interventions: Assess peripheral edema status every visit. Compression as ordered Provide education on venous insufficiency Treatment Activities: Non-invasive vascular studies : 07/19/2022 Notes: Wound/Skin Impairment Nursing Diagnoses: Knowledge deficit related to ulceration/compromised skin integrity Goals: Patient/caregiver will verbalize understanding of skin care regimen Date Initiated: 07/19/2022 Target Resolution Date: 08/31/2022 Goal Status: Active Interventions: Assess patient/caregiver ability to perform ulcer/skin care regimen upon admission and as needed Assess ulceration(s) every visit Provide education on ulcer and skin care Treatment Activities: Skin care regimen initiated : 07/19/2022 Topical wound management initiated : 07/19/2022 Notes: Electronic Signature(s) Signed: 08/13/2022 4:35:20 PM By: [name] Pilling RN, BSN Entered By: [name] on 08/13/2022  13:02:49 -------------------------------------------------------------------------------- Pain Assessment Details Patient Name: Date of Service: [name] 08/13/2022 12:45 PM Medical Record Number: [id number] Patient Account Number: [id number] Date of Birth/Sex: Treating RN: Jul 19, 1950 (71 y.o. F) Primary Care Hansika Leaming: [name] [name] Clinician: Referring Aissatou Fronczak: Treating Alic Hilburn/Extender: [name] [name] Weeks in Treatment: 3 Active Problems Location of Pain Severity and Description of Pain Patient Has Paino No Site Locations Pain Management and Medication Current Pain Management: Electronic Signature(s) Signed: 08/13/2022 3:10:00 PM By: [name] Entered By: [name] on 08/13/2022 12:50:25 -------------------------------------------------------------------------------- Patient/Caregiver Education Details Patient Name: Date of Service: [name] 8/14/2023andnbsp12:45 PM Medical Record Number: [id number] Patient Account Number: [id number] Date of Birth/Gender: Treating RN: November 03, 1950 (72 y.o. [name] Primary Care Physician: [name] [name] Clinician: Referring Physician: Treating Physician/Extender: [name] [name] Weeks in Treatment: 3 Education Assessment Education Provided To: Patient Education Topics Provided Wound/Skin Impairment: Handouts: Skin Care Do's and Dont's Methods: Explain/Verbal Responses: Reinforcements needed Electronic Signature(s) Signed: 08/13/2022 4:35:20 PM By: [name] Pilling RN, BSN Entered By: [name] on 08/13/2022 13:03:02 -------------------------------------------------------------------------------- Wound Assessment Details Patient Name: Date of Service: [name] 08/13/2022 12:45 PM Medical Record Number: [id number] Patient Account Number: [id number] Date of Birth/Sex: Treating RN: May 16, 1950 (71 y.o.  F) Primary Care Gotti Alwin: [name] [name] Clinician: Referring Elexis Pollak: Treating Dorcas Melito/Extender: [name] [name] Weeks in Treatment: 3 Wound Status Wound Number: 1 Primary Lymphedema Etiology: Wound Location: Left, Circumferential Lower Leg Wound Open Wounding Event: Gradually  Appeared Status: Date Acquired: 07/05/2022 Comorbid Anemia, Hypertension, Type II Diabetes, Gout, Osteoarthritis, Weeks Of Treatment: 3 History: Received Radiation Clustered Wound: Yes Photos Wound Measurements Length: (cm) 3 Width: (cm) 20 Depth: (cm) 0.1 Clustered Quantity: 3 Area: (cm) 47.124 Volume: (cm) 4.712 % Reduction in Area: 88.1% % Reduction in Volume: 88.1% Epithelialization: Large (67-100%) Tunneling: No Undermining: No Wound Description Classification: Full Thickness Without Exposed Support Structures Wound Margin: Distinct, outline attached Exudate Amount: Large Exudate Type: Serosanguineous Exudate Color: red, brown Foul Odor After Cleansing: No Slough/Fibrino No Wound Bed Granulation Amount: Large (67-100%) Exposed Structure Granulation Quality: Red, Pink Fascia Exposed: No Necrotic Amount: Small (1-33%) Fat Layer (Subcutaneous Tissue) Exposed: Yes Necrotic Quality: Adherent Slough Tendon Exposed: No Muscle Exposed: No Joint Exposed: No Bone Exposed: No Treatment Notes Wound #1 (Lower Leg) Wound Laterality: Left, Circumferential Cleanser Peri-Wound Care Triamcinolone 15 (g) Discharge Instruction: Use triamcinolone 15 (g) as directed Sween Lotion (Moisturizing lotion) Discharge Instruction: Apply moisturizing lotion as directed Topical Primary Dressing KerraCel Ag Gelling Fiber Dressing, 4x5 in (silver alginate) Discharge Instruction: Apply silver alginate to wound bed as instructed Secondary Dressing ABD Pad, 8x10 Discharge Instruction: Apply over primary dressing as directed. Secured With Compression  Wrap ThreePress (3 layer compression wrap) Discharge Instruction: Apply three layer compression as directed. unna boot first layer Discharge Instruction: apply unna boot first layer at upper portion of lower leg to secure wrap in place. Compression Stockings Add-Ons Electronic Signature(s) Signed: 08/13/2022 4:35:20 PM By: [name] Pilling RN, BSN Entered By: [name] on 08/13/2022 12:53:57 -------------------------------------------------------------------------------- Wound Assessment Details Patient Name: Date of Service: [name], [name] 08/13/2022 12:45 PM Medical Record Number: [id number] Patient Account Number: [id number] Date of Birth/Sex: Treating RN: Feb 18, 1950 (72 y.o. F) Primary Care Lambros Cerro: [name] [name] Clinician: Referring Brighton Pilley: Treating Jerae Izard/Extender: [name] [name] Weeks in Treatment: 3 Wound Status Wound Number: 2 Primary Lymphedema Etiology: Wound Location: Right, Circumferential Lower Leg Wound Open Wounding Event: Gradually Appeared Status: Date Acquired: 07/05/2022 Comorbid Anemia, Hypertension, Type II Diabetes, Gout, Osteoarthritis, Weeks Of Treatment: 3 History: Received Radiation Clustered Wound: Yes Photos Wound Measurements Length: (cm) 12 Width: (cm) 24 Depth: (cm) 0.1 Clustered Quantity: 3 Area: (cm) 226.195 Volume: (cm) 22.619 % Reduction in Area: 41.7% % Reduction in Volume: 41.7% Epithelialization: Large (67-100%) Tunneling: No Undermining: No Wound Description Classification: Full Thickness Without Exposed Support Structures Wound Margin: Distinct, outline attached Exudate Amount: Large Exudate Type: Serosanguineous Exudate Color: red, brown Foul Odor After Cleansing: No Slough/Fibrino No Wound Bed Granulation Amount: Large (67-100%) Exposed Structure Granulation Quality: Red, Pink Fascia Exposed: No Necrotic Amount: Small (1-33%) Fat Layer (Subcutaneous Tissue)  Exposed: Yes Necrotic Quality: Adherent Slough Tendon Exposed: No Muscle Exposed: No Joint Exposed: No Bone Exposed: No Treatment Notes Wound #2 (Lower Leg) Wound Laterality: Right, Circumferential Cleanser Peri-Wound Care Triamcinolone 15 (g) Discharge Instruction: Use triamcinolone 15 (g) as directed Sween Lotion (Moisturizing lotion) Discharge Instruction: Apply moisturizing lotion as directed Topical Primary Dressing KerraCel Ag Gelling Fiber Dressing, 4x5 in (silver alginate) Discharge Instruction: Apply silver alginate to wound bed as instructed Secondary Dressing ABD Pad, 8x10 Discharge Instruction: Apply over primary dressing as directed. Secured With Compression Wrap ThreePress (3 layer compression wrap) Discharge Instruction: Apply three layer compression as directed. unna boot first layer Discharge Instruction: apply unna boot first layer at upper portion of lower leg to secure wrap in place. Compression Stockings Add-Ons Electronic Signature(s) Signed: 08/13/2022 4:35:20 PM By: [name] Pilling RN, BSN  Entered By: [name] on 08/13/2022 12:54:24 -------------------------------------------------------------------------------- Vitals Details Patient Name: Date of Service: [name], [name] 08/13/2022 12:45 PM Medical Record Number: [id number] Patient Account Number: [id number] Date of Birth/Sex: Treating RN: 1950/01/21 (72 y.o. F) Primary Care Roland Prine: [name] [name] Clinician: Referring Earnestine Shipp: Treating Kristy Schomburg/Extender: [name] [name] Weeks in Treatment: 3 Vital Signs Time Taken: 12:49 Temperature (F): 99.1 Height (in): 64 Pulse (bpm): 86 Weight (lbs): 325 Respiratory Rate (breaths/min): 20 Body Mass Index (BMI): 55.8 Blood Pressure (mmHg): 148/69 Capillary Blood Glucose (mg/dl): 190 Reference Range: 80 - 120 mg / dl Electronic Signature(s) Signed: 08/13/2022 3:10:00 PM By: [name] Entered By:  [name] on 08/13/2022 12:50:20

## 2022-08-20 ENCOUNTER — Other Ambulatory Visit: Payer: Self-pay

## 2022-08-20 ENCOUNTER — Encounter (HOSPITAL_BASED_OUTPATIENT_CLINIC_OR_DEPARTMENT_OTHER): Payer: Medicare Other | Admitting: Internal Medicine

## 2022-08-20 DIAGNOSIS — I87333 Chronic venous hypertension (idiopathic) with ulcer and inflammation of bilateral lower extremity: Secondary | ICD-10-CM

## 2022-08-20 DIAGNOSIS — E11622 Type 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: Secondary | ICD-10-CM | POA: Diagnosis not present

## 2022-08-20 DIAGNOSIS — L97822 Non-pressure chronic ulcer of other part of left lower leg with fat layer exposed: Secondary | ICD-10-CM

## 2022-08-20 DIAGNOSIS — L97812 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d: Secondary | ICD-10-CM | POA: Diagnosis not present

## 2022-08-20 DIAGNOSIS — I509 Heart failure, unspecified: Secondary | ICD-10-CM

## 2022-08-20 DIAGNOSIS — L03115 Cellulitis of right lower limb: Secondary | ICD-10-CM | POA: Diagnosis not present

## 2022-08-20 DIAGNOSIS — L03116 Cellulitis of left lower limb: Secondary | ICD-10-CM | POA: Diagnosis not present

## 2022-08-20 NOTE — Progress Notes (Signed)
[name], [name] ([id number]) Visit Report for 08/20/2022 Chief Complaint Document Details Patient Name: Date of Service: [name], [name] 08/20/2022 12:45 PM Medical Record Number: [id number] Patient Account Number: [id number] Date of Birth/Sex: Treating RN: 1950/06/20 (72 y.o. F) Primary Care Provider: [name], [name] Other Clinician: Referring Provider: Treating Provider/Extender: [name] [name] [name] Weeks in Treatment: 4 Information Obtained from: Patient Chief Complaint 07/19/2022; bilateral lower extremity wounds Electronic Signature(s) Signed: 08/20/2022 2:26:36 PM By: [name] DO Entered By: [name] on 08/20/2022 13:44:25 -------------------------------------------------------------------------------- HPI Details Patient Name: Date of Service: [name] Leyden, MA RIA [name] 08/20/2022 12:45 PM Medical Record Number: [id number] Patient Account Number: [id number] Date of Birth/Sex: Treating RN: [birth year] (72 y.o. F) Primary Care Provider: [name] [name] Other Clinician: Referring Provider: Treating Provider/Extender: [name] [name] [name] Weeks in Treatment: 4 History of Present Illness HPI Description: Admission 07/19/2022 [name] Is a 72 year old female with a past medical history of endometrial cancer, lymphedema, insulin-dependent type 2 diabetes that presents to the clinic for a 4-week history of weeping to her lower extremities bilaterally. She visited the ED for this issue on 07/07/2022 and she was prescribed doxycycline For bilateral lower extremity cellulitis. She has completed this course. She currently denies signs of infection. She does not have compression stockings. She states she recently gained weight over the past couple months. She has never had wounds before. 7/28; patient presents for follow-up. We have been using silver alginate under Kerlix/Coban. She is scheduled to get her ABIs with TBI's on  8/7. She states that the wraps slid down to her mid shin. She denies signs of infection. 8/7; patient presents for follow-up. We have been using silver alginate under Kerlix/Coban. She had arterial studies done that showed an ABI of 0.86 on the right and 0.82 on the left with multiphasic waveforms throughout the lower extremities. 8/14; patient presents for follow-up. We have been using silver alginate under 3 layer compression. She tolerated the wrap well. She has no issues or complaints today. 8/21; patient presents for follow-up. We have been using silver alginate under 3 layer compression. She has no issues or complaints today. She has her juxta lite compressions with her. Electronic Signature(s) Signed: 08/20/2022 2:26:36 PM By: [name] DO Entered By: [name] on 08/20/2022 13:45:12 -------------------------------------------------------------------------------- Physical Exam Details Patient Name: Date of Service: [name] 08/20/2022 12:45 PM Medical Record Number: [id number] Patient Account Number: [id number] Date of Birth/Sex: Treating RN: Feb 09, 1950 (72 y.o. F) Primary Care Provider: [name] Other Clinician: Referring Provider: Treating Provider/Extender: [name] [name] [name] Weeks in Treatment: 4 Constitutional respirations regular, non-labored and within target range for patient.. Cardiovascular 2+ dorsalis pedis/posterior tibialis pulses. Psychiatric pleasant and cooperative. Notes Scattered wounds to the lower extremities bilaterally. Minimal weeping noted. 2+ pitting edema to the thigh. Lymphedema skin changes. Electronic Signature(s) Signed: 08/20/2022 2:26:36 PM By: [name] DO Entered By: [name] on 08/20/2022 13:45:42 -------------------------------------------------------------------------------- Physician Orders Details Patient Name: Date of Service: [name] 08/20/2022 12:45  PM Medical Record Number: [id number] Patient Account Number: [id number] Date of Birth/Sex: Treating RN: Mar 04, 1950 (72 y.o. [name] Primary Care Provider: [name] Other Clinician: Referring Provider: Treating Provider/Extender: [name] [name] [name] Weeks in Treatment: 4 Verbal / Phone Orders: No Diagnosis Coding ICD-10 Coding Code Description L97.812 Non-pressure chronic ulcer  of other part of right lower leg with fat layer exposed L97.822 Non-pressure chronic ulcer of other part of left lower leg with fat layer exposed I89.0 Lymphedema, not elsewhere classified I87.333 Chronic venous hypertension (idiopathic) with ulcer and inflammation of bilateral lower extremity E11.622 Type 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 Follow-up Appointments ppointment in 1 week. - Dr. Heber [name], Room 8 1245 08/27/2022 Monday Return A ppointment in 2 weeks. - Dr. Heber [place] and [place], Room 6 overflow 145pm 09/04/2022 Tuesday Return A Other: - Bring in juxtalites HD weekly in case wound closure. Anesthetic Wound #2 Right,Circumferential Lower Leg (In clinic) Topical Lidocaine 5% applied to wound bed Bathing/ Shower/ Hygiene May shower with protection but do not get wound dressing(s) wet. Edema Control - Lymphedema / SCD / Other Lymphedema Pumps. Use Lymphedema pumps on leg(s) 2-3 times a day for 45-60 minutes. If wearing any wraps or hose, do not remove them. Continue exercising as instructed. - 2-3 times a day throughout the day. Elevate legs to the level of the heart or above for 30 minutes daily and/or when sitting, a frequency of: - 3-4 times a day throughout the day. Avoid standing for long periods of time. Exercise regularly Wound Treatment Wound #1 - Lower Leg Wound Laterality: Left, Circumferential Peri-Wound Care: Triamcinolone 15 (g) 1 x Per Week/30 Days Discharge Instructions: Use triamcinolone  15 (g) as directed Peri-Wound Care: Sween Lotion (Moisturizing lotion) 1 x Per Week/30 Days Discharge Instructions: Apply moisturizing lotion as directed Prim Dressing: KerraCel Ag Gelling Fiber Dressing, 4x5 in (silver alginate) 1 x Per Week/30 Days ary Discharge Instructions: Apply silver alginate to wound bed as instructed Secondary Dressing: ABD Pad, 8x10 1 x Per Week/30 Days Discharge Instructions: Apply over primary dressing as directed. Compression Wrap: ThreePress (3 layer compression wrap) 1 x Per Week/30 Days Discharge Instructions: Apply three layer compression as directed. Compression Wrap: unna boot first layer 1 x Per Week/30 Days Discharge Instructions: apply unna boot first layer at upper portion of lower leg to secure wrap in place. Wound #2 - Lower Leg Wound Laterality: Right, Circumferential Peri-Wound Care: Triamcinolone 15 (g) 1 x Per Week/30 Days Discharge Instructions: Use triamcinolone 15 (g) as directed Peri-Wound Care: Sween Lotion (Moisturizing lotion) 1 x Per Week/30 Days Discharge Instructions: Apply moisturizing lotion as directed Prim Dressing: KerraCel Ag Gelling Fiber Dressing, 4x5 in (silver alginate) 1 x Per Week/30 Days ary Discharge Instructions: Apply silver alginate to wound bed as instructed Secondary Dressing: ABD Pad, 8x10 1 x Per Week/30 Days Discharge Instructions: Apply over primary dressing as directed. Compression Wrap: ThreePress (3 layer compression wrap) 1 x Per Week/30 Days Discharge Instructions: Apply three layer compression as directed. Compression Wrap: unna boot first layer 1 x Per Week/30 Days Discharge Instructions: apply unna boot first layer at upper portion of lower leg to secure wrap in place. Electronic Signature(s) Signed: 08/20/2022 2:26:36 PM By: [name] DO Entered By: [name] on 08/20/2022 13:45:50 -------------------------------------------------------------------------------- Problem List  Details Patient Name: Date of Service: [name] 08/20/2022 12:45 PM Medical Record Number: [id number] Patient Account Number: [id number] Date of Birth/Sex: Treating RN: [birth year] (72 y.o. [name] Primary Care Provider: [name] Other Clinician: Referring Provider: Treating Provider/Extender: [name] [name] Weeks in Treatment: 4 Active Problems ICD-10 Encounter Code Description Active Date MDM Diagnosis L97.812 Non-pressure chronic ulcer of other part of right lower leg with fat layer 07/19/2022  No Yes exposed L97.822 Non-pressure chronic ulcer of other part of left lower leg with fat layer exposed7/20/2023 No Yes I89.0 Lymphedema, not elsewhere classified 07/19/2022 No Yes I87.333 Chronic venous hypertension (idiopathic) with ulcer and inflammation of 07/19/2022 No Yes bilateral lower extremity E11.622 Type 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 07/19/2022 No Yes 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 07/19/2022 No Yes Inactive Problems Resolved Problems Electronic Signature(s) Signed: 08/20/2022 2:26:36 PM By: [name] DO Entered By: [name] on 08/20/2022 13:44:07 -------------------------------------------------------------------------------- Progress Note Details Patient Name: Date of Service: [name] Leyden, MA RIA [name] 08/20/2022 12:45 PM Medical Record Number: [id number] Patient Account Number: [id number] Date of Birth/Sex: Treating RN: 02-Mar-1950 (72 y.o. F) Primary Care Provider: [name] Other Clinician: Referring Provider: Treating Provider/Extender: [name] [name] [name] Weeks in Treatment: 4 Subjective Chief Complaint Information obtained from Patient 07/19/2022; bilateral lower extremity wounds History of Present Illness (HPI) Admission 07/19/2022 Ms. Maleeya Peterkin Is a 72 year old female with a past medical history of endometrial cancer, lymphedema,  insulin-dependent type 2 diabetes that presents to the clinic for a 4-week history of weeping to her lower extremities bilaterally. She visited the ED for this issue on 07/07/2022 and she was prescribed doxycycline For bilateral lower extremity cellulitis. She has completed this course. She currently denies signs of infection. She does not have compression stockings. She states she recently gained weight over the past couple months. She has never had wounds before. 7/28; patient presents for follow-up. We have been using silver alginate under Kerlix/Coban. She is scheduled to get her ABIs with TBI's on 8/7. She states that the wraps slid down to her mid shin. She denies signs of infection. 8/7; patient presents for follow-up. We have been using silver alginate under Kerlix/Coban. She had arterial studies done that showed an ABI of 0.86 on the right and 0.82 on the left with multiphasic waveforms throughout the lower extremities. 8/14; patient presents for follow-up. We have been using silver alginate under 3 layer compression. She tolerated the wrap well. She has no issues or complaints today. 8/21; patient presents for follow-up. We have been using silver alginate under 3 layer compression. She has no issues or complaints today. She has her juxta lite compressions with her. Patient History Information obtained from Patient, Chart. Family History Cancer - Father, Diabetes - Siblings. Social History Former smoker - quit 1988, Marital Status - Married, Alcohol Use - Moderate - cocktails x2 a week, Drug Use - No History, Caffeine Use - Never. Medical History Hematologic/Lymphatic Patient has history of Anemia - iron Cardiovascular Patient has history of Hypertension Endocrine Patient has history of Type II Diabetes Musculoskeletal Patient has history of Gout, Osteoarthritis Oncologic Patient has history of Received Radiation - 2023 Hospitalization/Surgery History - one ovary removed 2020. -  pilonidal cyst extraction 1969. - diverculitis temp colostomy. Medical A Surgical History Notes nd Cardiovascular hyperlipidemia Gastrointestinal GERD Diverticulitis Musculoskeletal carpel tunnel bilateral-surgery to right hand Oncologic endometrial cancer skin Ca Objective Constitutional respirations regular, non-labored and within target range for patient.. Vitals Time Taken: 1:00 PM, Height: 64 in, Weight: 325 lbs, BMI: 55.8, Temperature: 98.6 F, Pulse: 99 bpm, Respiratory Rate: 20 breaths/min, Blood Pressure: 130/73 mmHg, Capillary Blood Glucose: 150 mg/dl. Cardiovascular 2+ dorsalis pedis/posterior tibialis pulses. Psychiatric pleasant and cooperative. General Notes: Scattered wounds to the lower extremities bilaterally. Minimal weeping noted. 2+ pitting edema to the thigh. Lymphedema skin changes. Integumentary (Hair, Skin) Wound #1 status is Open. Original cause of wound  was Gradually Appeared. The date acquired was: 07/05/2022. The wound has been in treatment 4 weeks. The wound is located on the Left,Circumferential Lower Leg. The wound measures 1.8cm length x 2cm width x 0.1cm depth; 2.827cm^2 area and 0.283cm^3 volume. There is Fat Layer (Subcutaneous Tissue) exposed. There is no tunneling or undermining noted. There is a large amount of serosanguineous drainage noted. The wound margin is distinct with the outline attached to the wound base. There is large (67-100%) red, pink granulation within the wound bed. There is a small (1-33%) amount of necrotic tissue within the wound bed including Adherent Slough. Wound #2 status is Open. Original cause of wound was Gradually Appeared. The date acquired was: 07/05/2022. The wound has been in treatment 4 weeks. The wound is located on the Right,Circumferential Lower Leg. The wound measures 13cm length x 31.5cm width x 0.1cm depth; 321.621cm^2 area and 32.162cm^3 volume. There is Fat Layer (Subcutaneous Tissue) exposed. There is no  tunneling or undermining noted. There is a large amount of serosanguineous drainage noted. The wound margin is distinct with the outline attached to the wound base. There is large (67-100%) red, pink granulation within the wound bed. There is a small (1-33%) amount of necrotic tissue within the wound bed including Adherent Slough. Assessment Active Problems ICD-10 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d Non-pressure chronic ulcer of other part of left lower leg with fat layer exposed Lymphedema, not elsewhere classified Chronic venous hypertension (idiopathic) with ulcer and inflammation of bilateral lower extremity Type 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 Malignant neoplasm of endometrium Patient's wounds have shown improvement in size and appearance since last clinic visit. I recommended continuing the course with silver alginate under compression therapy. She has been using her husband's lymphedema pumps and tolerating this well. Follow-up in 1 week. Procedures Wound #1 Pre-procedure diagnosis of Wound #1 is a Lymphedema located on the Left,Circumferential Lower Leg . There was a Three Layer Compression Therapy Procedure by [name], RN. Post procedure Diagnosis Wound #1: Same as Pre-Procedure Wound #2 Pre-procedure diagnosis of Wound #2 is a Lymphedema located on the Right,Circumferential Lower Leg . There was a Three Layer Compression Therapy Procedure by [name], RN. Post procedure Diagnosis Wound #2: Same as Pre-Procedure Plan Follow-up Appointments: Return Appointment in 1 week. - Dr. Heber [place] and [place], Room 8 1245 08/27/2022 Monday Return Appointment in 2 weeks. - Dr. Heber [place] and [place], Room 6 overflow 145pm 09/04/2022 Tuesday Other: - Bring in juxtalites HD weekly in case wound closure. Anesthetic: Wound #2 Right,Circumferential Lower Leg: (In clinic) Topical Lidocaine 5% applied to wound bed Bathing/ Shower/  Hygiene: May shower with protection but do not get wound dressing(s) wet. Edema Control - Lymphedema / SCD / Other: Lymphedema Pumps. Use Lymphedema pumps on leg(s) 2-3 times a day for 45-60 minutes. If wearing any wraps or hose, do not remove them. Continue exercising as instructed. - 2-3 times a day throughout the day. Elevate legs to the level of the heart or above for 30 minutes daily and/or when sitting, a frequency of: - 3-4 times a day throughout the day. Avoid standing for long periods of time. Exercise regularly WOUND #1: - Lower Leg Wound Laterality: Left, Circumferential Peri-Wound Care: Triamcinolone 15 (g) 1 x Per Week/30 Days Discharge Instructions: Use triamcinolone 15 (g) as directed Peri-Wound Care: Sween Lotion (Moisturizing lotion) 1 x Per Week/30 Days Discharge Instructions: Apply moisturizing lotion as directed Prim Dressing: KerraCel Ag Gelling Fiber  Dressing, 4x5 in (silver alginate) 1 x Per Week/30 Days ary Discharge Instructions: Apply silver alginate to wound bed as instructed Secondary Dressing: ABD Pad, 8x10 1 x Per Week/30 Days Discharge Instructions: Apply over primary dressing as directed. Com pression Wrap: ThreePress (3 layer compression wrap) 1 x Per Week/30 Days Discharge Instructions: Apply three layer compression as directed. Com pression Wrap: unna boot first layer 1 x Per Week/30 Days Discharge Instructions: apply unna boot first layer at upper portion of lower leg to secure wrap in place. WOUND #2: - Lower Leg Wound Laterality: Right, Circumferential Peri-Wound Care: Triamcinolone 15 (g) 1 x Per Week/30 Days Discharge Instructions: Use triamcinolone 15 (g) as directed Peri-Wound Care: Sween Lotion (Moisturizing lotion) 1 x Per Week/30 Days Discharge Instructions: Apply moisturizing lotion as directed Prim Dressing: KerraCel Ag Gelling Fiber Dressing, 4x5 in (silver alginate) 1 x Per Week/30 Days ary Discharge Instructions: Apply silver alginate to  wound bed as instructed Secondary Dressing: ABD Pad, 8x10 1 x Per Week/30 Days Discharge Instructions: Apply over primary dressing as directed. Com pression Wrap: ThreePress (3 layer compression wrap) 1 x Per Week/30 Days Discharge Instructions: Apply three layer compression as directed. Com pression Wrap: unna boot first layer 1 x Per Week/30 Days Discharge Instructions: apply unna boot first layer at upper portion of lower leg to secure wrap in place. 1. Silver alginate under 3 layer compression 2. Follow-up in 1 week Electronic Signature(s) Signed: 08/20/2022 2:26:36 PM By: [name] DO Entered By: [name] on 08/20/2022 13:46:48 -------------------------------------------------------------------------------- HxROS Details Patient Name: Date of Service: [name] Leyden, MA RIA [name] 08/20/2022 12:45 PM Medical Record Number: [id number] Patient Account Number: [id number] Date of Birth/Sex: Treating RN: 02-25-1950 (72 y.o. F) Primary Care Provider: [name] Other Clinician: Referring Provider: Treating Provider/Extender: [name] [name] [name] Weeks in Treatment: 4 Information Obtained From Patient Chart Hematologic/Lymphatic Medical History: Positive for: Anemia - iron Cardiovascular Medical History: Positive for: Hypertension Past Medical History Notes: hyperlipidemia Gastrointestinal Medical History: Past Medical History Notes: GERD Diverticulitis Endocrine Medical History: Positive for: Type II Diabetes Time with diabetes: 72 years old Treated with: Insulin, Diet Blood sugar tested every day: Yes Tested : three times a week. Musculoskeletal Medical History: Positive for: Gout; Osteoarthritis Past Medical History Notes: carpel tunnel bilateral-surgery to right hand Oncologic Medical History: Positive for: Received Radiation - 2023 Past Medical History Notes: endometrial cancer skin Ca Immunizations Pneumococcal  Vaccine: Received Pneumococcal Vaccination: Yes Received Pneumococcal Vaccination On or After 60th Birthday: No Implantable Devices No devices added Hospitalization / Surgery History Type of Hospitalization/Surgery one ovary removed 2020 pilonidal cyst extraction 1969 diverculitis temp colostomy Family and Social History Cancer: Yes - Father; Diabetes: Yes - Siblings; Former smoker - quit 1988; Marital Status - Married; Alcohol Use: Moderate - cocktails x2 a week; Drug Use: No History; Caffeine Use: Never; Financial Concerns: No; Food, Clothing or Shelter Needs: No; Support System Lacking: No; Transportation Concerns: No Electronic Signature(s) Signed: 08/20/2022 2:26:36 PM By: [name] DO Entered By: [name] on 08/20/2022 13:45:19 -------------------------------------------------------------------------------- SuperBill Details Patient Name: Date of Service: [name] 08/20/2022 Medical Record Number: [id number] Patient Account Number: [id number] Date of Birth/Sex: Treating RN: 1950-11-11 (72 y.o. [name] Primary Care Provider: [name] [name] Other Clinician: Referring Provider: Treating Provider/Extender: [name] [name] [name] Weeks in Treatment: 4 Diagnosis Coding ICD-10 Codes Code Description [phone] Non-pressure chronic ulcer of other part of right  lower leg with fat layer exposed L97.822 Non-pressure chronic ulcer of other part of left lower leg with fat layer exposed I89.0 Lymphedema, not elsewhere classified I87.333 Chronic venous hypertension (idiopathic) with ulcer and inflammation of bilateral lower extremity E11.622 Type 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 Facility Procedures CPT4: Code [id number] Description: 81 BILATERAL: Application of multi-layer venous compression system; leg (below knee), including ankle and t. Modifier: Quantity: 1 Physician  Procedures : CPT4 Code Description Modifier [id number] - WC PHYS LEVEL 3 - EST PT ICD-10 Diagnosis Description A57.903 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d L97.822 Non-pressure chronic ulcer of other part of left  lower leg with fat layer exposed I87.333 Chronic venous hypertension (idiopathic) with ulcer and inflammation of bilateral lower extremity E11.622 Type 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 Quantity: 1 Electronic Signature(s) Signed: 08/20/2022 2:26:36 PM By: [name] DO Entered By: [name] on 08/20/2022 13:47:07

## 2022-08-20 NOTE — Progress Notes (Signed)
[name], [name] ([id number]) Visit Report for 08/20/2022 Arrival Information Details Patient Name: Date of Service: [name], [name] [name] 08/20/2022 12:45 PM Medical Record Number: [id number] Patient Account Number: [id number] Date of Birth/Sex: Treating RN: 06-05-1950 (72 y.o. F) Primary Care Kenady Doxtater: [name] [name] Other Clinician: Referring Trenity Pha: Treating Kamareon Sciandra/Extender: [name] [name] [name] Weeks in Treatment: 4 Visit Information History Since Last Visit Added or deleted any medications: No Patient Arrived: Wheel Chair Any new allergies or adverse reactions: No Arrival Time: 12:58 Had a fall or experienced change in No Accompanied By: self activities of daily living that may affect Transfer Assistance: None risk of falls: Patient Identification Verified: Yes Signs or symptoms of abuse/neglect since last visito No Secondary Verification Process Completed: Yes Hospitalized since last visit: No Patient Requires Transmission-Based No Implantable device outside of the clinic excluding No Precautions: cellular tissue based products placed in the center Patient Has Alerts: Yes since last visit: Patient Alerts: 08/06/2022 TBI L 0.59 R0.65 Has Compression in Place as Prescribed: Yes 08/06/2022 ABI R 0.86 Pain Present Now: Yes L0.82 Electronic Signature(s) Signed: 08/20/2022 4:55:34 PM By: [name] Entered By: [name] on 08/20/2022 12:59:02 -------------------------------------------------------------------------------- Compression Therapy Details Patient Name: Date of Service: [name] RIA N 08/20/2022 12:45 PM Medical Record Number: [id number] Patient Account Number: [id number] Date of Birth/Sex: Treating RN: Aug 02, 1950 (72 y.o. [name] Primary Care Turkessa Ostrom: [name] [name] Other Clinician: Referring [name] [name]: Treating Davon Folta/Extender: [name] [name] [name] Weeks in Treatment: 4 Compression  Therapy Performed for Wound Assessment: Wound #1 Left,Circumferential Lower Leg Performed By: Clinician [name], RN Compression Type: Three Layer Post Procedure Diagnosis Same as Pre-procedure Electronic Signature(s) Signed: 08/20/2022 4:49:28 PM By: [name] RN, BSN Entered By: [name] on 08/20/2022 13:17:03 -------------------------------------------------------------------------------- Compression Therapy Details Patient Name: Date of Service: [name] RIA N 08/20/2022 12:45 PM Medical Record Number: [id number] Patient Account Number: [id number] Date of Birth/Sex: Treating RN: Feb 21, 1950 (72 y.o. [name] Primary Care Darleene Cumpian: [name] [name] Other Clinician: Referring Stashia Sia: Treating Barett Whidbee/Extender: [name] [name] [name] Weeks in Treatment: 4 Compression Therapy Performed for Wound Assessment: Wound #2 Right,Circumferential Lower Leg Performed By: Clinician [name], RN Compression Type: Three Layer Post Procedure Diagnosis Same as Pre-procedure Electronic Signature(s) Signed: 08/20/2022 4:49:28 PM By: [name] RN, BSN Entered By: [name] on 08/20/2022 13:17:03 -------------------------------------------------------------------------------- Encounter Discharge Information Details Patient Name: Date of Service: [name] RIA N 08/20/2022 12:45 PM Medical Record Number: [id number] Patient Account Number: [id number] Date of Birth/Sex: Treating RN: 03/02/1950 (72 y.o. [name] Primary Care Ashlen Kiger: [name] [name] Other Clinician: Referring Earnie Rockhold: Treating Airen Dales/Extender: [name] [name] [name] Weeks in Treatment: 4 Encounter Discharge Information Items Discharge Condition: Stable Ambulatory Status: Wheelchair Discharge Destination: Home Transportation: Private Auto Accompanied By: self Schedule Follow-up Appointment: Yes Clinical Summary of  Care: Electronic Signature(s) Signed: 08/20/2022 4:49:28 PM By: [name] RN, BSN Entered By: [name] on 08/20/2022 13:32:58 -------------------------------------------------------------------------------- Lower Extremity Assessment Details Patient Name: Date of Service: [name] RIA N 08/20/2022 12:45 PM Medical Record Number: [id number] Patient Account Number: [id number] Date of Birth/Sex: Treating RN: 10-31-1950 (72 y.o. F) Primary Care Raizy Auzenne: [name] Other Clinician: Referring Gemayel Mascio: Treating Cayetano Mikita/Extender: [name] [name] [name] Weeks in Treatment: 4 Edema Assessment Assessed: [Left: No] [Right: No] Edema: [Left:  Yes] [Right: Yes] Calf Left: Right: Point of Measurement: 39 cm From Medial Instep 59 cm 49 cm Ankle Left: Right: Point of Measurement: 10 cm From Medial Instep 30 cm 28 cm Electronic Signature(s) Signed: 08/20/2022 4:55:34 PM By: [name] Entered By: [name] on 08/20/2022 13:13:06 -------------------------------------------------------------------------------- Multi Wound Chart Details Patient Name: Date of Service: [name] RIA N 08/20/2022 12:45 PM Medical Record Number: [id number] Patient Account Number: [id number] Date of Birth/Sex: Treating RN: 1950/02/13 (72 y.o. F) Primary Care Shubh Chiara: [name] Other Clinician: Referring Timiko Offutt: Treating Ronda Rajkumar/Extender: [name] [name] [name] Weeks in Treatment: 4 Vital Signs Height(in): 64 Capillary Blood Glucose(mg/dl): 150 Weight(lbs): 325 Pulse(bpm): 99 Body Mass Index(BMI): 55.8 Blood Pressure(mmHg): 130/73 Temperature(F): 98.6 Respiratory Rate(breaths/min): 20 Photos: [N/A:N/A] Left, Circumferential Lower Leg Right, Circumferential Lower Leg N/A Wound Location: Gradually Appeared Gradually Appeared N/A Wounding Event: Lymphedema Lymphedema N/A Primary Etiology: Anemia, Hypertension, Type  II Anemia, Hypertension, Type II N/A Comorbid History: Diabetes, Gout, Osteoarthritis, Diabetes, Gout, Osteoarthritis, Received Radiation Received Radiation 07/05/2022 07/05/2022 N/A Date Acquired: 4 4 N/A Weeks of Treatment: Open Open N/A Wound Status: No No N/A Wound Recurrence: Yes Yes N/A Clustered Wound: 3 3 N/A Clustered Quantity: 1.8x2x0.1 13x31.5x0.1 N/A Measurements L x W x D (cm) 2.827 321.621 N/A A (cm) : rea 0.283 32.162 N/A Volume (cm) : 99.30% 17.10% N/A % Reduction in Area: 99.30% 17.10% N/A % Reduction in Volume: Full Thickness Without Exposed Full Thickness Without Exposed N/A Classification: Support Structures Support Structures Large Large N/A Exudate Amount: Serosanguineous Serosanguineous N/A Exudate Type: red, brown red, brown N/A Exudate Color: Distinct, outline attached Distinct, outline attached N/A Wound Margin: Large (67-100%) Large (67-100%) N/A Granulation Amount: Red, Pink Red, Pink N/A Granulation Quality: Small (1-33%) Small (1-33%) N/A Necrotic Amount: Fat Layer (Subcutaneous Tissue): Yes Fat Layer (Subcutaneous Tissue): Yes N/A Exposed Structures: Fascia: No Fascia: No Tendon: No Tendon: No Muscle: No Muscle: No Joint: No Joint: No Bone: No Bone: No Large (67-100%) Large (67-100%) N/A Epithelialization: Compression Therapy Compression Therapy N/A Procedures Performed: Treatment Notes Wound #1 (Lower Leg) Wound Laterality: Left, Circumferential Cleanser Peri-Wound Care Triamcinolone 15 (g) Discharge Instruction: Use triamcinolone 15 (g) as directed Sween Lotion (Moisturizing lotion) Discharge Instruction: Apply moisturizing lotion as directed Topical Primary Dressing KerraCel Ag Gelling Fiber Dressing, 4x5 in (silver alginate) Discharge Instruction: Apply silver alginate to wound bed as instructed Secondary Dressing ABD Pad, 8x10 Discharge Instruction: Apply over primary dressing as directed. Secured  With Compression Wrap ThreePress (3 layer compression wrap) Discharge Instruction: Apply three layer compression as directed. unna boot first layer Discharge Instruction: apply unna boot first layer at upper portion of lower leg to secure wrap in place. Compression Stockings Add-Ons Wound #2 (Lower Leg) Wound Laterality: Right, Circumferential Cleanser Peri-Wound Care Triamcinolone 15 (g) Discharge Instruction: Use triamcinolone 15 (g) as directed Sween Lotion (Moisturizing lotion) Discharge Instruction: Apply moisturizing lotion as directed Topical Primary Dressing KerraCel Ag Gelling Fiber Dressing, 4x5 in (silver alginate) Discharge Instruction: Apply silver alginate to wound bed as instructed Secondary Dressing ABD Pad, 8x10 Discharge Instruction: Apply over primary dressing as directed. Secured With Compression Wrap ThreePress (3 layer compression wrap) Discharge Instruction: Apply three layer compression as directed. unna boot first layer Discharge Instruction: apply unna boot first layer at upper portion of lower leg to secure wrap in place. Compression Stockings Add-Ons Electronic Signature(s) Signed: 08/20/2022 2:26:36 PM By: [name] DO Entered By: [name] on 08/20/2022 13:44:17 -------------------------------------------------------------------------------- [place]  Details Patient Name: Date of Service: [name], [name] 08/20/2022 12:45 PM Medical Record Number: [id number] Patient Account Number: [id number] Date of Birth/Sex: Treating RN: 1950/07/15 (72 y.o. [name] Primary Care Kiran Lapine: [name] [name] Other Clinician: Referring Micalah Cabezas: Treating Chinmay Squier/Extender: [name] [name] [name] Weeks in Treatment: 4 Active Inactive Pain, Acute or Chronic Nursing Diagnoses: Pain, acute or chronic: actual or potential Potential alteration in comfort, pain Goals: Patient will verbalize  adequate pain control and receive pain control interventions during procedures as needed Date Initiated: 07/19/2022 Target Resolution Date: 08/31/2022 Goal Status: Active Patient/caregiver will verbalize comfort level met Date Initiated: 07/19/2022 Date Inactivated: 08/06/2022 Target Resolution Date: 08/06/2022 Goal Status: Met Interventions: Encourage patient to take pain medications as prescribed Provide education on pain management Reposition patient for comfort Treatment Activities: Administer pain control measures as ordered : 07/19/2022 Notes: Venous Leg Ulcer Nursing Diagnoses: Actual venous Insuffiency (use after diagnosis is confirmed) Potential for venous Insuffiency (use before diagnosis confirmed) Goals: Non-invasive venous studies are completed as ordered Date Initiated: 07/19/2022 Target Resolution Date: 08/31/2022 Goal Status: Active Interventions: Assess peripheral edema status every visit. Compression as ordered Provide education on venous insufficiency Treatment Activities: Non-invasive vascular studies : 07/19/2022 Notes: Wound/Skin Impairment Nursing Diagnoses: Knowledge deficit related to ulceration/compromised skin integrity Goals: Patient/caregiver will verbalize understanding of skin care regimen Date Initiated: 07/19/2022 Target Resolution Date: 08/31/2022 Goal Status: Active Interventions: Assess patient/caregiver ability to perform ulcer/skin care regimen upon admission and as needed Assess ulceration(s) every visit Provide education on ulcer and skin care Treatment Activities: Skin care regimen initiated : 07/19/2022 Topical wound management initiated : 07/19/2022 Notes: Electronic Signature(s) Signed: 08/20/2022 4:49:28 PM By: [name] RN, BSN Entered By: [name] on 08/20/2022 13:16:31 -------------------------------------------------------------------------------- Pain Assessment Details Patient Name: Date of Service: [name] RIA N  08/20/2022 12:45 PM Medical Record Number: [id number] Patient Account Number: [id number] Date of Birth/Sex: Treating RN: 19-Nov-1950 (72 y.o. F) Primary Care Elfreida Heggs: [name] [name] Other Clinician: Referring Shaneeka Scarboro: Treating Moroni Nester/Extender: [name] [name] [name] Weeks in Treatment: 4 Active Problems Location of Pain Severity and Description of Pain Patient Has Paino Yes Site Locations Pain Location: Pain in Ulcers Rate the pain. Current Pain Level: 2 Pain Management and Medication Current Pain Management: Electronic Signature(s) Signed: 08/20/2022 4:55:34 PM By: [name] Entered By: [name] on 08/20/2022 12:59:22 -------------------------------------------------------------------------------- Patient/Caregiver Education Details Patient Name: Date of Service: Aleda Grana 8/21/2023andnbsp12:45 PM Medical Record Number: [id number] Patient Account Number: [id number] Date of Birth/Gender: Treating RN: October 13, 1950 (72 y.o. [name] Primary Care Physician: [name] [name] Other Clinician: Referring Physician: Treating Physician/Extender: [name] [name] [name] Weeks in Treatment: 4 Education Assessment Education Provided To: Patient Education Topics Provided Wound/Skin Impairment: Handouts: Skin Care Do's and Dont's Methods: Explain/Verbal Responses: Reinforcements needed Electronic Signature(s) Signed: 08/20/2022 4:49:28 PM By: [name] RN, BSN Entered By: [name] on 08/20/2022 13:16:43 -------------------------------------------------------------------------------- Wound Assessment Details Patient Name: Date of Service: [name] RIA N 08/20/2022 12:45 PM Medical Record Number: [id number] Patient Account Number: [id number] Date of Birth/Sex: Treating RN: 11/05/1950 (72 y.o. F) Primary Care Lauren Modisette: [name] [name] Other Clinician: Referring Nasri Boakye: Treating  Bobbi Kozakiewicz/Extender: [name] [name] [name] Weeks in Treatment: 4 Wound Status Wound Number: 1 Primary Lymphedema Etiology: Wound Location: Left, Circumferential Lower Leg Wound Open Wounding Event: Gradually Appeared Status: Date  Acquired: 07/05/2022 Comorbid Anemia, Hypertension, Type II Diabetes, Gout, Osteoarthritis, Weeks Of Treatment: 4 History: Received Radiation Clustered Wound: Yes Photos Wound Measurements Length: (cm) 1.8 Width: (cm) 2 Depth: (cm) 0.1 Clustered Quantity: 3 Area: (cm) 2.827 Volume: (cm) 0.283 % Reduction in Area: 99.3% % Reduction in Volume: 99.3% Epithelialization: Large (67-100%) Tunneling: No Undermining: No Wound Description Classification: Full Thickness Without Exposed Support Structures Wound Margin: Distinct, outline attached Exudate Amount: Large Exudate Type: Serosanguineous Exudate Color: red, brown Foul Odor After Cleansing: No Slough/Fibrino No Wound Bed Granulation Amount: Large (67-100%) Exposed Structure Granulation Quality: Red, Pink Fascia Exposed: No Necrotic Amount: Small (1-33%) Fat Layer (Subcutaneous Tissue) Exposed: Yes Necrotic Quality: Adherent Slough Tendon Exposed: No Muscle Exposed: No Joint Exposed: No Bone Exposed: No Treatment Notes Wound #1 (Lower Leg) Wound Laterality: Left, Circumferential Cleanser Peri-Wound Care Triamcinolone 15 (g) Discharge Instruction: Use triamcinolone 15 (g) as directed Sween Lotion (Moisturizing lotion) Discharge Instruction: Apply moisturizing lotion as directed Topical Primary Dressing KerraCel Ag Gelling Fiber Dressing, 4x5 in (silver alginate) Discharge Instruction: Apply silver alginate to wound bed as instructed Secondary Dressing ABD Pad, 8x10 Discharge Instruction: Apply over primary dressing as directed. Secured With Compression Wrap ThreePress (3 layer compression wrap) Discharge Instruction: Apply three layer compression as  directed. unna boot first layer Discharge Instruction: apply unna boot first layer at upper portion of lower leg to secure wrap in place. Compression Stockings Add-Ons Electronic Signature(s) Signed: 08/20/2022 4:55:34 PM By: [name] Entered By: [name] on 08/20/2022 13:15:05 -------------------------------------------------------------------------------- Wound Assessment Details Patient Name: Date of Service: [name], [name] 08/20/2022 12:45 PM Medical Record Number: [id number] Patient Account Number: [id number] Date of Birth/Sex: Treating RN: 06/08/1950 (72 y.o. F) Primary Care Jusiah Aguayo: [name] Other Clinician: Referring Lewie Deman: Treating Mallarie Voorhies/Extender: [name] [name] [name] Weeks in Treatment: 4 Wound Status Wound Number: 2 Primary Lymphedema Etiology: Wound Location: Right, Circumferential Lower Leg Wound Open Wounding Event: Gradually Appeared Status: Date Acquired: 07/05/2022 Comorbid Anemia, Hypertension, Type II Diabetes, Gout, Osteoarthritis, Weeks Of Treatment: 4 History: Received Radiation Clustered Wound: Yes Photos Wound Measurements Length: (cm) 13 Width: (cm) 31.5 Depth: (cm) 0.1 Clustered Quantity: 3 Area: (cm) 321.621 Volume: (cm) 32.162 % Reduction in Area: 17.1% % Reduction in Volume: 17.1% Epithelialization: Large (67-100%) Tunneling: No Undermining: No Wound Description Classification: Full Thickness Without Exposed Support Structures Wound Margin: Distinct, outline attached Exudate Amount: Large Exudate Type: Serosanguineous Exudate Color: red, brown Foul Odor After Cleansing: No Slough/Fibrino No Wound Bed Granulation Amount: Large (67-100%) Exposed Structure Granulation Quality: Red, Pink Fascia Exposed: No Necrotic Amount: Small (1-33%) Fat Layer (Subcutaneous Tissue) Exposed: Yes Necrotic Quality: Adherent Slough Tendon Exposed: No Muscle Exposed: No Joint Exposed: No Bone  Exposed: No Treatment Notes Wound #2 (Lower Leg) Wound Laterality: Right, Circumferential Cleanser Peri-Wound Care Triamcinolone 15 (g) Discharge Instruction: Use triamcinolone 15 (g) as directed Sween Lotion (Moisturizing lotion) Discharge Instruction: Apply moisturizing lotion as directed Topical Primary Dressing KerraCel Ag Gelling Fiber Dressing, 4x5 in (silver alginate) Discharge Instruction: Apply silver alginate to wound bed as instructed Secondary Dressing ABD Pad, 8x10 Discharge Instruction: Apply over primary dressing as directed. Secured With Compression Wrap ThreePress (3 layer compression wrap) Discharge Instruction: Apply three layer compression as directed. unna boot first layer Discharge Instruction: apply unna boot first layer at upper portion of lower leg to secure wrap in place. Compression Stockings Add-Ons Electronic Signature(s) Signed: 08/20/2022 4:55:34 PM By: [name] Entered By: [name] on 08/20/2022 13:14:29 --------------------------------------------------------------------------------  Vitals Details Patient Name: Date of Service: [name], [name] 08/20/2022 12:45 PM Medical Record Number: [id number] Patient Account Number: [id number] Date of Birth/Sex: Treating RN: 12-07-1950 (72 y.o. F) Primary Care Jayonna Meyering: [name] [name] Other Clinician: Referring Klein Willcox: Treating Daren Doswell/Extender: [name] [name] [name] Weeks in Treatment: 4 Vital Signs Time Taken: 13:00 Temperature (F): 98.6 Height (in): 64 Pulse (bpm): 99 Weight (lbs): 325 Respiratory Rate (breaths/min): 20 Body Mass Index (BMI): 55.8 Blood Pressure (mmHg): 130/73 Capillary Blood Glucose (mg/dl): 150 Reference Range: 80 - 120 mg / dl Electronic Signature(s) Signed: 08/20/2022 4:55:34 PM By: [name] Entered By: [name] on 08/20/2022 13:01:07

## 2022-08-21 ENCOUNTER — Other Ambulatory Visit: Payer: Medicare Other

## 2022-08-21 DIAGNOSIS — I509 Heart failure, unspecified: Secondary | ICD-10-CM | POA: Diagnosis not present

## 2022-08-22 ENCOUNTER — Other Ambulatory Visit: Payer: Self-pay | Admitting: Adult Health

## 2022-08-22 DIAGNOSIS — I10 Essential (primary) hypertension: Secondary | ICD-10-CM

## 2022-08-22 LAB — BASIC METABOLIC PANEL WITH GFR
BUN/Creatinine Ratio: 17 (calc) (ref 6–22)
BUN: 19 mg/dL (ref 7–25)
CO2: 21 mmol/L (ref 20–32)
Calcium: 9 mg/dL (ref 8.6–10.4)
Chloride: 107 mmol/L (ref 98–110)
Creat: 1.13 mg/dL — ABNORMAL HIGH (ref 0.60–1.00)
Glucose, Bld: 170 mg/dL — ABNORMAL HIGH (ref 65–99)
Potassium: 4.3 mmol/L (ref 3.5–5.3)
Sodium: 141 mmol/L (ref 135–146)
eGFR: 51 mL/min/{1.73_m2} — ABNORMAL LOW (ref >=60)

## 2022-08-22 NOTE — Progress Notes (Signed)
BMP is good, same as the previous one.

## 2022-08-22 NOTE — Telephone Encounter (Signed)
Patient has request refill on medication Amlodipine. Patient medication last refilled 07/30/2022. Patient given 30 tablets with 0 refills. Patient medication pend and sent to PCP Medina-Vargas, Monina C, NP for further refills.

## 2022-08-27 ENCOUNTER — Encounter (HOSPITAL_BASED_OUTPATIENT_CLINIC_OR_DEPARTMENT_OTHER): Payer: Medicare Other | Admitting: Internal Medicine

## 2022-08-27 ENCOUNTER — Other Ambulatory Visit: Payer: Self-pay | Admitting: Adult Health

## 2022-08-27 DIAGNOSIS — L97822 Non-pressure chronic ulcer of other part of left lower leg with fat layer exposed: Secondary | ICD-10-CM

## 2022-08-27 DIAGNOSIS — I87333 Chronic venous hypertension (idiopathic) with ulcer and inflammation of bilateral lower extremity: Secondary | ICD-10-CM

## 2022-08-27 DIAGNOSIS — E11622 Type 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: Secondary | ICD-10-CM | POA: Diagnosis not present

## 2022-08-27 DIAGNOSIS — I10 Essential (primary) hypertension: Secondary | ICD-10-CM

## 2022-08-27 DIAGNOSIS — L97812 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d: Secondary | ICD-10-CM

## 2022-08-27 DIAGNOSIS — L03115 Cellulitis of right lower limb: Secondary | ICD-10-CM | POA: Diagnosis not present

## 2022-08-27 DIAGNOSIS — L03116 Cellulitis of left lower limb: Secondary | ICD-10-CM | POA: Diagnosis not present

## 2022-08-27 NOTE — Progress Notes (Signed)
[name], [name] ([id number]) Visit Report for 08/27/2022 Chief Complaint Document Details Patient Name: Date of Service: [name], [name] 08/27/2022 12:45 [name] Medical Record Number: [id number] Patient Account Number: [id number] Date of Birth/Sex: Treating RN: [birth year] (72 y.o. [name] Primary Care Provider: [facility] [place], [name] Other Clinician: Referring Provider: Treating Provider/Extender: [name] [name], [name] Weeks in Treatment: 5 Information Obtained from: Patient Chief Complaint 07/19/2022; bilateral lower extremity wounds Electronic Signature(s) Signed: 08/27/2022 4:36:36 [name] By: [name] DO Entered By: [name] on 08/27/2022 13:16:25 -------------------------------------------------------------------------------- HPI Details Patient Name: Date of Service: [name] [name] 08/27/2022 12:45 [name] Medical Record Number: [id number] Patient Account Number: [id number] Date of Birth/Sex: Treating RN: [birth year] (72 y.o. [name] Primary Care Provider: [name], [name] Other Clinician: Referring Provider: Treating Provider/Extender: [name] [name], [name] Weeks in Treatment: 5 History of Present Illness HPI Description: Admission 07/19/2022 [name] Is a 72 year old female with a past medical history of endometrial cancer, lymphedema, insulin-dependent type 2 diabetes that presents to the clinic for a 4-week history of weeping to her lower extremities bilaterally. She visited the ED for this issue on 07/07/2022 and she was prescribed doxycycline For bilateral lower extremity cellulitis. She has completed this course. She currently denies signs of infection. She does not have compression stockings. She states she recently gained weight over the past couple months. She has never had wounds before. 7/28; patient presents for follow-up. We have been using silver alginate under Kerlix/Coban. She is scheduled to  get her ABIs with TBI's on 8/7. She states that the wraps slid down to her mid shin. She denies signs of infection. 8/7; patient presents for follow-up. We have been using silver alginate under Kerlix/Coban. She had arterial studies done that showed an ABI of 0.86 on the right and 0.82 on the left with multiphasic waveforms throughout the lower extremities. 8/14; patient presents for follow-up. We have been using silver alginate under 3 layer compression. She tolerated the wrap well. She has no issues or complaints today. 8/21; patient presents for follow-up. We have been using silver alginate under 3 layer compression. She has no issues or complaints today. She has her juxta lite compressions with her. 8/28; patient presents for follow-up. We have been using silver alginate under 3 layer compression. She has no issues or complaints today. Electronic Signature(s) Signed: 08/27/2022 4:36:36 [name] By: [name] DO Entered By: [name] on 08/27/2022 13:16:50 -------------------------------------------------------------------------------- Physical Exam Details Patient Name: Date of Service: [name], [name] 08/27/2022 12:45 [name] Medical Record Number: [id number] Patient Account Number: [id number] Date of Birth/Sex: Treating RN: [birth year] (72 y.o. [name] Primary Care Provider: [name], [name] Other Clinician: Referring Provider: Treating Provider/Extender: [name] [name], [name] Weeks in Treatment: 5 Constitutional respirations regular, non-labored and within target range for patient.. Cardiovascular 2+ dorsalis pedis/posterior tibialis pulses. Psychiatric pleasant and cooperative. Notes 2 wounds located to the right lower extremity with granulation tissue. 1 wound present to the left lower extremity with granulation tissue present 2+ pitting edema to the thigh Lymphedema skin changes No signs of surrounding infection to any of the wound  beds Electronic Signature(s) Signed: 08/27/2022 4:36:36 [name] By: [name] DO Entered By: [name] on 08/27/2022 13:17:37 -------------------------------------------------------------------------------- Physician Orders Details Patient Name: Date of Service: [name], [name] 08/27/2022 12:45 [name] Medical Record Number: [id number] Patient Account Number: [id number] Date  of Birth/Sex: Treating RN: 04-17-1950 (72 y.o. [name] Primary Care Provider: [name], [name] Other Clinician: Referring Provider: Treating Provider/Extender: [name] [name], [name] Weeks in Treatment: 5 Verbal / Phone Orders: No Diagnosis Coding Follow-up Appointments ppointment in 1 week. - Dr. Heber [place] and [place], Room 6 overflow 145pm 09/04/2022 Tuesday Return A ppointment in 2 weeks. - Dr. Heber [place] and [place], Room 8 09/11/2022 Tuesday 1245pm Return A Other: - Bring in juxtalites HD weekly in case wound closure. Bathing/ Shower/ Hygiene May shower with protection but do not get wound dressing(s) wet. Edema Control - Lymphedema / SCD / Other Lymphedema Pumps. Use Lymphedema pumps on leg(s) 2-3 times a day for 45-60 minutes. If wearing any wraps or hose, do not remove them. Continue exercising as instructed. - 2-3 times a day throughout the day. Elevate legs to the level of the heart or above for 30 minutes daily and/or when sitting, a frequency of: - 3-4 times a day throughout the day. Avoid standing for long periods of time. Exercise regularly Wound Treatment Wound #1 - Lower Leg Wound Laterality: Left, Circumferential Peri-Wound Care: Triamcinolone 15 (g) 1 x Per Week/30 Days Discharge Instructions: Use triamcinolone 15 (g) as directed Peri-Wound Care: Sween Lotion (Moisturizing lotion) 1 x Per Week/30 Days Discharge Instructions: Apply moisturizing lotion as directed Prim Dressing: KerraCel Ag Gelling Fiber Dressing, 4x5 in (silver alginate) 1 x Per Week/30  Days ary Discharge Instructions: Apply silver alginate to wound bed as instructed Secondary Dressing: ABD Pad, 8x10 1 x Per Week/30 Days Discharge Instructions: Apply over primary dressing as directed. Compression Wrap: FourPress (4 layer compression wrap) 1 x Per Week/30 Days Discharge Instructions: Apply four layer compression as directed. May also use Miliken CoFlex 2 layer compression system as alternative. Compression Wrap: unna boot first layer 1 x Per Week/30 Days Discharge Instructions: apply unna boot first layer at upper portion of lower leg to secure wrap in place. Wound #2 - Lower Leg Wound Laterality: Right, Circumferential Peri-Wound Care: Triamcinolone 15 (g) 1 x Per Week/30 Days Discharge Instructions: Use triamcinolone 15 (g) as directed Peri-Wound Care: Sween Lotion (Moisturizing lotion) 1 x Per Week/30 Days Discharge Instructions: Apply moisturizing lotion as directed Prim Dressing: KerraCel Ag Gelling Fiber Dressing, 4x5 in (silver alginate) 1 x Per Week/30 Days ary Discharge Instructions: Apply silver alginate to wound bed as instructed Secondary Dressing: ABD Pad, 8x10 1 x Per Week/30 Days Discharge Instructions: Apply over primary dressing as directed. Compression Wrap: FourPress (4 layer compression wrap) 1 x Per Week/30 Days Discharge Instructions: Apply four layer compression as directed. May also use Miliken CoFlex 2 layer compression system as alternative. Compression Wrap: unna boot first layer 1 x Per Week/30 Days Discharge Instructions: apply unna boot first layer at upper portion of lower leg to secure wrap in place. Electronic Signature(s) Signed: 08/27/2022 4:36:36 [name] By: [name] DO Entered By: [name] on 08/27/2022 13:17:45 -------------------------------------------------------------------------------- Problem List Details Patient Name: Date of Service: [name] [name] 08/27/2022 12:45 [name] Medical Record Number: [id number] Patient  Account Number: [id number] Date of Birth/Sex: Treating RN: Jun 25, 1950 (72 y.o. [name] Primary Care Provider: [name], [name] Other Clinician: Referring Provider: Treating Provider/Extender: [name] [name], [name] Weeks in Treatment: 5 Active Problems ICD-10 Encounter Code Description Active Date MDM Diagnosis L97.812 Non-pressure chronic ulcer of other part of right lower leg with fat layer 07/19/2022 No Yes exposed L97.822 Non-pressure chronic ulcer  of other part of left lower leg with fat layer exposed7/20/2023 No Yes I89.0 Lymphedema, not elsewhere classified 07/19/2022 No Yes I87.333 Chronic venous hypertension (idiopathic) with ulcer and inflammation of 07/19/2022 No Yes bilateral lower extremity E11.622 Type 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 07/19/2022 No Yes 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 07/19/2022 No Yes Inactive Problems Resolved Problems Electronic Signature(s) Signed: 08/27/2022 4:36:36 [name] By: [name] DO Entered By: [name] on 08/27/2022 13:16:08 -------------------------------------------------------------------------------- Progress Note Details Patient Name: Date of Service: [name] [name] 08/27/2022 12:45 [name] Medical Record Number: [id number] Patient Account Number: [id number] Date of Birth/Sex: Treating RN: Feb 11, 1950 (72 y.o. [name] Primary Care Provider: [name], [name] Other Clinician: Referring Provider: Treating Provider/Extender: [name] [name], [name] Weeks in Treatment: 5 Subjective Chief Complaint Information obtained from Patient 07/19/2022; bilateral lower extremity wounds History of Present Illness (HPI) Admission 07/19/2022 [name] Is a 72 year old female with a past medical history of endometrial cancer, lymphedema, insulin-dependent type 2 diabetes that presents to the clinic for a 4-week history of weeping to her lower extremities  bilaterally. She visited the ED for this issue on 07/07/2022 and she was prescribed doxycycline For bilateral lower extremity cellulitis. She has completed this course. She currently denies signs of infection. She does not have compression stockings. She states she recently gained weight over the past couple months. She has never had wounds before. 7/28; patient presents for follow-up. We have been using silver alginate under Kerlix/Coban. She is scheduled to get her ABIs with TBI's on 8/7. She states that the wraps slid down to her mid shin. She denies signs of infection. 8/7; patient presents for follow-up. We have been using silver alginate under Kerlix/Coban. She had arterial studies done that showed an ABI of 0.86 on the right and 0.82 on the left with multiphasic waveforms throughout the lower extremities. 8/14; patient presents for follow-up. We have been using silver alginate under 3 layer compression. She tolerated the wrap well. She has no issues or complaints today. 8/21; patient presents for follow-up. We have been using silver alginate under 3 layer compression. She has no issues or complaints today. She has her juxta lite compressions with her. 8/28; patient presents for follow-up. We have been using silver alginate under 3 layer compression. She has no issues or complaints today. Patient History Information obtained from Patient, Chart. Family History Cancer - Father, Diabetes - Siblings. Social History Former smoker - quit 1988, Marital Status - Married, Alcohol Use - Moderate - cocktails x2 a week, Drug Use - No History, Caffeine Use - Never. Medical History Hematologic/Lymphatic Patient has history of Anemia - iron Cardiovascular Patient has history of Hypertension Endocrine Patient has history of Type II Diabetes Musculoskeletal Patient has history of Gout, Osteoarthritis Oncologic Patient has history of Received Radiation - 2023 Hospitalization/Surgery History - one  ovary removed 2020. - pilonidal cyst extraction 1969. - diverculitis temp colostomy. Medical A Surgical History Notes nd Cardiovascular hyperlipidemia Gastrointestinal GERD Diverticulitis Musculoskeletal carpel tunnel bilateral-surgery to right hand Oncologic endometrial cancer skin Ca Objective Constitutional respirations regular, non-labored and within target range for patient.. Vitals Time Taken: 12:50 [name], Height: 64 in, Weight: 325 lbs, BMI: 55.8, Temperature: 98.5 F, Pulse: 108 bpm, Respiratory Rate: 20 breaths/min, Blood Pressure: 133/82 mmHg, Capillary Blood Glucose: 197 mg/dl. Cardiovascular 2+ dorsalis pedis/posterior tibialis pulses. Psychiatric pleasant and cooperative. General Notes: 2 wounds located to the right lower extremity with granulation tissue. 1 wound present  to the left lower extremity with granulation tissue present 2+ pitting edema to the thigh Lymphedema skin changes No signs of surrounding infection to any of the wound beds Integumentary (Hair, Skin) Wound #1 status is Open. Original cause of wound was Gradually Appeared. The date acquired was: 07/05/2022. The wound has been in treatment 5 weeks. The wound is located on the Left,Circumferential Lower Leg. The wound measures 0.5cm length x 0.5cm width x 0.1cm depth; 0.196cm^2 area and 0.02cm^3 volume. There is no tunneling or undermining noted. There is a none present amount of drainage noted. The wound margin is distinct with the outline attached to the wound base. There is no granulation within the wound bed. There is no necrotic tissue within the wound bed. Wound #2 status is Open. Original cause of wound was Gradually Appeared. The date acquired was: 07/05/2022. The wound has been in treatment 5 weeks. The wound is located on the Right,Circumferential Lower Leg. The wound measures 5.7cm length x 24cm width x 0.1cm depth; 107.442cm^2 area and 10.744cm^3 volume. There is Fat Layer (Subcutaneous Tissue) exposed.  There is no tunneling or undermining noted. There is a medium amount of serosanguineous drainage noted. The wound margin is distinct with the outline attached to the wound base. There is large (67-100%) red, pink granulation within the wound bed. There is no necrotic tissue within the wound bed. Assessment Active Problems ICD-10 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d Non-pressure chronic ulcer of other part of left lower leg with fat layer exposed Lymphedema, not elsewhere classified Chronic venous hypertension (idiopathic) with ulcer and inflammation of bilateral lower extremity Type 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 Malignant neoplasm of endometrium Patient's wounds have shown improvement in size and appearance since last clinic visit. I recommended continuing course with silver alginate under compression therapy. I will increase the compression from 3 layer to 2 layer Coflex. She has received her juxta lite compression in the mail. Procedures Wound #1 Pre-procedure diagnosis of Wound #1 is a Lymphedema located on the Left,Circumferential Lower Leg . There was a Double Layer Compression Therapy Procedure by [name], RN. Post procedure Diagnosis Wound #1: Same as Pre-Procedure Wound #2 Pre-procedure diagnosis of Wound #2 is a Lymphedema located on the Right,Circumferential Lower Leg . There was a Double Layer Compression Therapy Procedure by [name], RN. Post procedure Diagnosis Wound #2: Same as Pre-Procedure Plan Follow-up Appointments: Return Appointment in 1 week. - Dr. Heber [place] and [place], Room 6 overflow 145pm 09/04/2022 Tuesday Return Appointment in 2 weeks. - Dr. Heber [place] and [place], Room 8 09/11/2022 Tuesday 1245pm Other: - Bring in juxtalites HD weekly in case wound closure. Bathing/ Shower/ Hygiene: May shower with protection but do not get wound dressing(s) wet. Edema Control - Lymphedema / SCD / Other: Lymphedema  Pumps. Use Lymphedema pumps on leg(s) 2-3 times a day for 45-60 minutes. If wearing any wraps or hose, do not remove them. Continue exercising as instructed. - 2-3 times a day throughout the day. Elevate legs to the level of the heart or above for 30 minutes daily and/or when sitting, a frequency of: - 3-4 times a day throughout the day. Avoid standing for long periods of time. Exercise regularly WOUND #1: - Lower Leg Wound Laterality: Left, Circumferential Peri-Wound Care: Triamcinolone 15 (g) 1 x Per Week/30 Days Discharge Instructions: Use triamcinolone 15 (g) as directed Peri-Wound Care: Sween Lotion (Moisturizing lotion) 1 x Per Week/30 Days Discharge Instructions: Apply moisturizing lotion as  directed Prim Dressing: KerraCel Ag Gelling Fiber Dressing, 4x5 in (silver alginate) 1 x Per Week/30 Days ary Discharge Instructions: Apply silver alginate to wound bed as instructed Secondary Dressing: ABD Pad, 8x10 1 x Per Week/30 Days Discharge Instructions: Apply over primary dressing as directed. Com pression Wrap: FourPress (4 layer compression wrap) 1 x Per Week/30 Days Discharge Instructions: Apply four layer compression as directed. May also use Miliken CoFlex 2 layer compression system as alternative. Com pression Wrap: unna boot first layer 1 x Per Week/30 Days Discharge Instructions: apply unna boot first layer at upper portion of lower leg to secure wrap in place. WOUND #2: - Lower Leg Wound Laterality: Right, Circumferential Peri-Wound Care: Triamcinolone 15 (g) 1 x Per Week/30 Days Discharge Instructions: Use triamcinolone 15 (g) as directed Peri-Wound Care: Sween Lotion (Moisturizing lotion) 1 x Per Week/30 Days Discharge Instructions: Apply moisturizing lotion as directed Prim Dressing: KerraCel Ag Gelling Fiber Dressing, 4x5 in (silver alginate) 1 x Per Week/30 Days ary Discharge Instructions: Apply silver alginate to wound bed as instructed Secondary Dressing: ABD Pad, 8x10 1  x Per Week/30 Days Discharge Instructions: Apply over primary dressing as directed. Com pression Wrap: FourPress (4 layer compression wrap) 1 x Per Week/30 Days Discharge Instructions: Apply four layer compression as directed. May also use Miliken CoFlex 2 layer compression system as alternative. Com pression Wrap: unna boot first layer 1 x Per Week/30 Days Discharge Instructions: apply unna boot first layer at upper portion of lower leg to secure wrap in place. 1. Silver alginate under 2 layer Coflex 2. Follow-up in 1 week Electronic Signature(s) Signed: 08/27/2022 4:36:36 [name] By: [name] DO Entered By: [name] on 08/27/2022 13:19:19 -------------------------------------------------------------------------------- HxROS Details Patient Name: Date of Service: [name] [place], [place] [name] 08/27/2022 12:45 [name] Medical Record Number: [id number] Patient Account Number: [id number] Date of Birth/Sex: Treating RN: [birth year] (72 y.o. [name] Primary Care Provider: [name], [name] Other Clinician: Referring Provider: Treating Provider/Extender: [name] [name], [name] Weeks in Treatment: 5 Information Obtained From Patient Chart Hematologic/Lymphatic Medical History: Positive for: Anemia - iron Cardiovascular Medical History: Positive for: Hypertension Past Medical History Notes: hyperlipidemia Gastrointestinal Medical History: Past Medical History Notes: GERD Diverticulitis Endocrine Medical History: Positive for: Type II Diabetes Time with diabetes: 72 years old Treated with: Insulin, Diet Blood sugar tested every day: Yes Tested : three times a week. Musculoskeletal Medical History: Positive for: Gout; Osteoarthritis Past Medical History Notes: carpel tunnel bilateral-surgery to right hand Oncologic Medical History: Positive for: Received Radiation - 2023 Past Medical History Notes: endometrial cancer skin  Ca Immunizations Pneumococcal Vaccine: Received Pneumococcal Vaccination: Yes Received Pneumococcal Vaccination On or After 60th Birthday: No Implantable Devices No devices added Hospitalization / Surgery History Type of Hospitalization/Surgery one ovary removed 2020 pilonidal cyst extraction 1969 diverculitis temp colostomy Family and Social History Cancer: Yes - Father; Diabetes: Yes - Siblings; Former smoker - quit 1988; Marital Status - Married; Alcohol Use: Moderate - cocktails x2 a week; Drug Use: No History; Caffeine Use: Never; Financial Concerns: No; Food, Clothing or Shelter Needs: No; Support System Lacking: No; Transportation Concerns: No Electronic Signature(s) Signed: 08/27/2022 4:36:36 [name] By: [name] DO Signed: 08/27/2022 5:13:32 [name] By: [name] RN, BSN Entered By: [name] on 08/27/2022 13:16:55 -------------------------------------------------------------------------------- SuperBill Details Patient Name: Date of Service: [name] [name] 08/27/2022 Medical Record Number: [id number] Patient Account Number: [id number] Date of Birth/Sex: Treating RN: 28-Jan-1950 (72 y.o. [name] Primary Care  Provider: [name], [name] Other Clinician: Referring Provider: Treating Provider/Extender: [name] [name], [name] Weeks in Treatment: 5 Diagnosis Coding ICD-10 Codes Code Description [phone] 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d L97.822 Non-pressure chronic ulcer of other part of left lower leg with fat layer exposed I89.0 Lymphedema, not elsewhere classified I87.333 Chronic venous hypertension (idiopathic) with ulcer and inflammation of bilateral lower extremity E11.622 Type 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 Facility Procedures CPT4: Code [id number] Description: 81 BILATERAL: Application of multi-layer venous  compression system; leg (below knee), including ankle and t. Modifier: Quantity: 1 Physician Procedures : CPT4 Code Description Modifier [id number] - WC PHYS LEVEL 3 - EST PT ICD-10 Diagnosis Description Z16.967 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d L97.822 Non-pressure chronic ulcer of other part of left  lower leg with fat layer exposed I87.333 Chronic venous hypertension (idiopathic) with ulcer and inflammation of bilateral lower extremity E11.622 Type 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 Quantity: 1 Electronic Signature(s) Signed: 08/27/2022 4:36:36 [name] By: [name] DO Entered By: [name] on 08/27/2022 13:19:37

## 2022-08-27 NOTE — Progress Notes (Signed)
ADVIKA, MCLELLAND ([id number]) Visit Report for 08/27/2022 Arrival Information Details Patient Name: Date of Service: Jerri, [name] 08/27/2022 12:45 PM Medical Record Number: [id number] Patient Account Number: [id number] Date of Birth/Sex: Treating RN: [birth year] (72 y.o. [name], [name] Primary Care Dietrich Samuelson: [name], [name] Other Clinician: Referring Zonya Gudger: Treating Dantavious Snowball/Extender: [name] [name], [name] Weeks in Treatment: 5 Visit Information History Since Last Visit Added or deleted any medications: No Patient Arrived: Wheel Chair Any new allergies or adverse reactions: No Arrival Time: 12:49 Had a fall or experienced change in No Accompanied By: self activities of daily living that may affect Transfer Assistance: None risk of falls: Patient Identification Verified: Yes Signs or symptoms of abuse/neglect since last visito No Secondary Verification Process Completed: Yes Hospitalized since last visit: No Patient Requires Transmission-Based No Implantable device outside of the clinic excluding No Precautions: cellular tissue based products placed in the center Patient Has Alerts: Yes since last visit: Patient Alerts: 08/06/2022 TBI L 0.59 R0.65 Has Compression in Place as Prescribed: Yes 08/06/2022 ABI R 0.86 Pain Present Now: No L0.82 Electronic Signature(s) Signed: 08/27/2022 4:41:46 PM By: [name] Entered By: [name] on 08/27/2022 12:49:21 -------------------------------------------------------------------------------- Compression Therapy Details Patient Name: Date of Service: Aleda Grana 08/27/2022 12:45 PM Medical Record Number: [id number] Patient Account Number: [id number] Date of Birth/Sex: Treating RN: October 16, 1950 (72 y.o. [name] Primary Care Jeanean Hollett: [name], [name] Other Clinician: Referring Lashelle Koy: Treating Kyrese Gartman/Extender: [name] [name], [name] Weeks in Treatment:  5 Compression Therapy Performed for Wound Assessment: Wound #1 Left,Circumferential Lower Leg Performed By: Clinician [name], RN Compression Type: Double Layer Post Procedure Diagnosis Same as Pre-procedure Electronic Signature(s) Signed: 08/27/2022 5:13:32 PM By: [name] RN, BSN Entered By: [name] on 08/27/2022 13:10:09 -------------------------------------------------------------------------------- Compression Therapy Details Patient Name: Date of Service: Aleda Grana 08/27/2022 12:45 PM Medical Record Number: [id number] Patient Account Number: [id number] Date of Birth/Sex: Treating RN: Jun 18, 1950 (72 y.o. [name] Primary Care Sharan Mcenaney: [name], [name] Other Clinician: Referring Tosh Glaze: Treating Indie Nickerson/Extender: [name] [name], [name] Weeks in Treatment: 5 Compression Therapy Performed for Wound Assessment: Wound #2 Right,Circumferential Lower Leg Performed By: Clinician [name], RN Compression Type: Double Layer Post Procedure Diagnosis Same as Pre-procedure Electronic Signature(s) Signed: 08/27/2022 5:13:32 PM By: [name] RN, BSN Entered By: [name] on 08/27/2022 13:10:09 -------------------------------------------------------------------------------- Encounter Discharge Information Details Patient Name: Date of Service: [name] 08/27/2022 12:45 PM Medical Record Number: [id number] Patient Account Number: [id number] Date of Birth/Sex: Treating RN: 29-Sep-1950 (72 y.o. [name] Primary Care Adahlia Stembridge: [name], [name] Other Clinician: Referring Kloey Cazarez: Treating Malon Siddall/Extender: [name] [name], [name] Weeks in Treatment: 5 Encounter Discharge Information Items Discharge Condition: Stable Ambulatory Status: Wheelchair Discharge Destination: Home Transportation: Private Auto Accompanied By: self Schedule Follow-up Appointment: Yes Clinical  Summary of Care: Electronic Signature(s) Signed: 08/27/2022 5:13:32 PM By: [name] RN, BSN Entered By: [name] on 08/27/2022 13:12:15 -------------------------------------------------------------------------------- Lower Extremity Assessment Details Patient Name: Date of Service: [place], [name] 08/27/2022 12:45 PM Medical Record Number: [id number] Patient Account Number: [id number] Date of Birth/Sex: Treating RN: [birth year] (72 y.o. [name] Primary Care Ephrata Verville: [name], [name] Other Clinician: Referring Philomina Leon: Treating Nikki Glanzer/Extender: [name] [name], [name] Weeks in Treatment: 5 Edema Assessment Assessed: [Left: Yes] [  Right: Yes] Edema: [Left: Yes] [Right: Yes] Calf Left: Right: Point of Measurement: 39 cm From Medial Instep 49 cm 48.5 cm Ankle Left: Right: Point of Measurement: 10 cm From Medial Instep 31 cm 29 cm Vascular Assessment Pulses: Dorsalis Pedis Palpable: [Left:Yes] [Right:Yes] Electronic Signature(s) Signed: 08/27/2022 4:41:46 PM By: [name] Signed: 08/27/2022 5:13:32 PM By: [name] RN, BSN Entered By: [name] on 08/27/2022 12:53:25 -------------------------------------------------------------------------------- Multi Wound Chart Details Patient Name: Date of Service: [name] 08/27/2022 12:45 PM Medical Record Number: [id number] Patient Account Number: [id number] Date of Birth/Sex: Treating RN: 10-28-1950 (72 y.o. [name] Primary Care Sabien Umland: [name], [name] Other Clinician: Referring Nygel Prokop: Treating Shandy Checo/Extender: [name] [name], [name] Weeks in Treatment: 5 Vital Signs Height(in): 64 Capillary Blood Glucose(mg/dl): 197 Weight(lbs): 325 Pulse(bpm): 108 Body Mass Index(BMI): 55.8 Blood Pressure(mmHg): 133/82 Temperature(F): 98.5 Respiratory Rate(breaths/min): 20 Photos: [N/A:N/A] Left, Circumferential Lower Leg Right,  Circumferential Lower Leg N/A Wound Location: Gradually Appeared Gradually Appeared N/A Wounding Event: Lymphedema Lymphedema N/A Primary Etiology: Anemia, Hypertension, Type II Anemia, Hypertension, Type II N/A Comorbid History: Diabetes, Gout, Osteoarthritis, Diabetes, Gout, Osteoarthritis, Received Radiation Received Radiation 07/05/2022 07/05/2022 N/A Date Acquired: 5 5 N/A Weeks of Treatment: Open Open N/A Wound Status: No No N/A Wound Recurrence: Yes Yes N/A Clustered Wound: 0 2 N/A Clustered Quantity: 0.5x0.5x0.1 5.7x24x0.1 N/A Measurements L x W x D (cm) 0.196 107.442 N/A A (cm) : rea 0.02 10.744 N/A Volume (cm) : 100.00% 72.30% N/A % Reduction in Area: 99.90% 72.30% N/A % Reduction in Volume: Full Thickness Without Exposed Full Thickness Without Exposed N/A Classification: Support Structures Support Structures None Present Medium N/A Exudate Amount: N/A Serosanguineous N/A Exudate Type: N/A red, brown N/A Exudate Color: Distinct, outline attached Distinct, outline attached N/A Wound Margin: None Present (0%) Large (67-100%) N/A Granulation Amount: N/A Red, Pink N/A Granulation Quality: None Present (0%) None Present (0%) N/A Necrotic Amount: Fascia: No Fat Layer (Subcutaneous Tissue): Yes N/A Exposed Structures: Fat Layer (Subcutaneous Tissue): No Fascia: No Tendon: No Tendon: No Muscle: No Muscle: No Joint: No Joint: No Bone: No Bone: No Large (67-100%) Large (67-100%) N/A Epithelialization: Compression Therapy Compression Therapy N/A Procedures Performed: Treatment Notes Wound #1 (Lower Leg) Wound Laterality: Left, Circumferential Cleanser Peri-Wound Care Triamcinolone 15 (g) Discharge Instruction: Use triamcinolone 15 (g) as directed Sween Lotion (Moisturizing lotion) Discharge Instruction: Apply moisturizing lotion as directed Topical Primary Dressing KerraCel Ag Gelling Fiber Dressing, 4x5 in (silver alginate) Discharge  Instruction: Apply silver alginate to wound bed as instructed Secondary Dressing ABD Pad, 8x10 Discharge Instruction: Apply over primary dressing as directed. Secured With Compression Wrap FourPress (4 layer compression wrap) Discharge Instruction: Apply four layer compression as directed. May also use Miliken CoFlex 2 layer compression system as alternative. unna boot first layer Discharge Instruction: apply unna boot first layer at upper portion of lower leg to secure wrap in place. Compression Stockings Add-Ons Wound #2 (Lower Leg) Wound Laterality: Right, Circumferential Cleanser Peri-Wound Care Triamcinolone 15 (g) Discharge Instruction: Use triamcinolone 15 (g) as directed Sween Lotion (Moisturizing lotion) Discharge Instruction: Apply moisturizing lotion as directed Topical Primary Dressing KerraCel Ag Gelling Fiber Dressing, 4x5 in (silver alginate) Discharge Instruction: Apply silver alginate to wound bed as instructed Secondary Dressing ABD Pad, 8x10 Discharge Instruction: Apply over primary dressing as directed. Secured With Compression Wrap FourPress (4 layer compression wrap) Discharge Instruction: Apply four layer compression as directed. May also use Miliken CoFlex 2 layer compression system as  alternative. unna boot first layer Discharge Instruction: apply unna boot first layer at upper portion of lower leg to secure wrap in place. Compression Stockings Add-Ons Electronic Signature(s) Signed: 08/27/2022 4:36:36 PM By: [name] DO Signed: 08/27/2022 5:13:32 PM By: [name] RN, BSN Entered By: [name] on 08/27/2022 13:16:13 -------------------------------------------------------------------------------- Multi-Disciplinary Care Plan Details Patient Name: Date of Service: [place], [place] 08/27/2022 12:45 PM Medical Record Number: [id number] Patient Account Number: [id number] Date of Birth/Sex: Treating RN: 05-23-1950 (72 y.o. [name] Primary Care Adeli Frost: [name], [name] Other Clinician: Referring Zykeriah Mathia: Treating Zaine Elsass/Extender: [name] [name], [name] Weeks in Treatment: 5 Active Inactive Pain, Acute or Chronic Nursing Diagnoses: Pain, acute or chronic: actual or potential Potential alteration in comfort, pain Goals: Patient will verbalize adequate pain control and receive pain control interventions during procedures as needed Date Initiated: 07/19/2022 Target Resolution Date: 09/28/2022 Goal Status: Active Patient/caregiver will verbalize comfort level met Date Initiated: 07/19/2022 Date Inactivated: 08/06/2022 Target Resolution Date: 08/06/2022 Goal Status: Met Interventions: Encourage patient to take pain medications as prescribed Provide education on pain management Reposition patient for comfort Treatment Activities: Administer pain control measures as ordered : 07/19/2022 Notes: Venous Leg Ulcer Nursing Diagnoses: Actual venous Insuffiency (use after diagnosis is confirmed) Potential for venous Insuffiency (use before diagnosis confirmed) Goals: Non-invasive venous studies are completed as ordered Date Initiated: 07/19/2022 Target Resolution Date: 09/28/2022 Goal Status: Active Interventions: Assess peripheral edema status every visit. Compression as ordered Provide education on venous insufficiency Treatment Activities: Non-invasive vascular studies : 07/19/2022 Notes: Wound/Skin Impairment Nursing Diagnoses: Knowledge deficit related to ulceration/compromised skin integrity Goals: Patient/caregiver will verbalize understanding of skin care regimen Date Initiated: 07/19/2022 Target Resolution Date: 09/28/2022 Goal Status: Active Interventions: Assess patient/caregiver ability to perform ulcer/skin care regimen upon admission and as needed Assess ulceration(s) every visit Provide education on ulcer and skin care Treatment Activities: Skin care regimen  initiated : 07/19/2022 Topical wound management initiated : 07/19/2022 Notes: Electronic Signature(s) Signed: 08/27/2022 5:13:32 PM By: [name] RN, BSN Entered By: [name] on 08/27/2022 13:11:18 -------------------------------------------------------------------------------- Pain Assessment Details Patient Name: Date of Service: [name] 08/27/2022 12:45 PM Medical Record Number: [id number] Patient Account Number: [id number] Date of Birth/Sex: Treating RN: 04-18-1950 (72 y.o. [name] Primary Care Dalanie Kisner: [name], [name] Other Clinician: Referring Maley Venezia: Treating Townsend Cudworth/Extender: [name] [name], [name] Weeks in Treatment: 5 Active Problems Location of Pain Severity and Description of Pain Patient Has Paino No Site Locations Pain Management and Medication Current Pain Management: Electronic Signature(s) Signed: 08/27/2022 4:41:46 PM By: [name] Signed: 08/27/2022 5:13:32 PM By: [name] RN, BSN Entered By: [name] on 08/27/2022 12:51:18 -------------------------------------------------------------------------------- Patient/Caregiver Education Details Patient Name: Date of Service: Aleda Grana 8/28/2023andnbsp12:45 PM Medical Record Number: [id number] Patient Account Number: [id number] Date of Birth/Gender: Treating RN: 11-27-1950 (72 y.o. [name] Primary Care Physician: [name], [name] Other Clinician: Referring Physician: Treating Physician/Extender: [name] [name], [name] Weeks in Treatment: 5 Education Assessment Education Provided To: Patient Education Topics Provided Venous: Handouts: Controlling Swelling with Multilayered Compression Wraps Methods: Explain/Verbal Responses: Reinforcements needed Electronic Signature(s) Signed: 08/27/2022 5:13:32 PM By: [name] RN, BSN Entered By: [name] on 08/27/2022  13:11:34 -------------------------------------------------------------------------------- Wound Assessment Details Patient Name: Date of Service: [name] 08/27/2022 12:45 PM Medical Record Number: [id number] Patient Account Number: [id number] Date of Birth/Sex:  Treating RN: 10-Apr-1950 (72 y.o. [name], [name] Primary Care Tracey Stewart: [name], [name] Other Clinician: Referring Devone Tousley: Treating Rangel Echeverri/Extender: [name] [name], [name] Weeks in Treatment: 5 Wound Status Wound Number: 1 Primary Lymphedema Etiology: Wound Location: Left, Circumferential Lower Leg Wound Open Wounding Event: Gradually Appeared Status: Date Acquired: 07/05/2022 Comorbid Anemia, Hypertension, Type II Diabetes, Gout, Osteoarthritis, Weeks Of Treatment: 5 History: Received Radiation Clustered Wound: Yes Photos Wound Measurements Length: (cm) 0.5 Width: (cm) 0.5 Depth: (cm) 0.1 Clustered Quantity: 0 Area: (cm) 0.196 Volume: (cm) 0.02 % Reduction in Area: 100% % Reduction in Volume: 99.9% Epithelialization: Large (67-100%) Tunneling: No Undermining: No Wound Description Classification: Full Thickness Without Exposed Support Structures Wound Margin: Distinct, outline attached Exudate Amount: None Present Foul Odor After Cleansing: No Slough/Fibrino No Wound Bed Granulation Amount: None Present (0%) Exposed Structure Necrotic Amount: None Present (0%) Fascia Exposed: No Fat Layer (Subcutaneous Tissue) Exposed: No Tendon Exposed: No Muscle Exposed: No Joint Exposed: No Bone Exposed: No Treatment Notes Wound #1 (Lower Leg) Wound Laterality: Left, Circumferential Cleanser Peri-Wound Care Triamcinolone 15 (g) Discharge Instruction: Use triamcinolone 15 (g) as directed Sween Lotion (Moisturizing lotion) Discharge Instruction: Apply moisturizing lotion as directed Topical Primary Dressing KerraCel Ag Gelling Fiber Dressing, 4x5 in (silver  alginate) Discharge Instruction: Apply silver alginate to wound bed as instructed Secondary Dressing ABD Pad, 8x10 Discharge Instruction: Apply over primary dressing as directed. Secured With Compression Wrap FourPress (4 layer compression wrap) Discharge Instruction: Apply four layer compression as directed. May also use Miliken CoFlex 2 layer compression system as alternative. unna boot first layer Discharge Instruction: apply unna boot first layer at upper portion of lower leg to secure wrap in place. Compression Stockings Add-Ons Electronic Signature(s) Signed: 08/27/2022 5:13:32 PM By: [name] RN, BSN Entered By: [name] on 08/27/2022 12:58:46 -------------------------------------------------------------------------------- Wound Assessment Details Patient Name: Date of Service: [name] 08/27/2022 12:45 PM Medical Record Number: [id number] Patient Account Number: [id number] Date of Birth/Sex: Treating RN: 08-Mar-1950 (72 y.o. [name] Primary Care Finbar Nippert: [name], [name] Other Clinician: Referring Nyia Tsao: Treating Tammra Pressman/Extender: [name] [name], [name] Weeks in Treatment: 5 Wound Status Wound Number: 2 Primary Lymphedema Etiology: Wound Location: Right, Circumferential Lower Leg Wound Open Wounding Event: Gradually Appeared Status: Date Acquired: 07/05/2022 Comorbid Anemia, Hypertension, Type II Diabetes, Gout, Osteoarthritis, Weeks Of Treatment: 5 History: Received Radiation Clustered Wound: Yes Photos Wound Measurements Length: (cm) 5.7 Width: (cm) 24 Depth: (cm) 0.1 Clustered Quantity: 2 Area: (cm) 107.442 Volume: (cm) 10.744 % Reduction in Area: 72.3% % Reduction in Volume: 72.3% Epithelialization: Large (67-100%) Tunneling: No Undermining: No Wound Description Classification: Full Thickness Without Exposed Support Structures Wound Margin: Distinct, outline attached Exudate Amount:  Medium Exudate Type: Serosanguineous Exudate Color: red, brown Foul Odor After Cleansing: No Slough/Fibrino No Wound Bed Granulation Amount: Large (67-100%) Exposed Structure Granulation Quality: Red, Pink Fascia Exposed: No Necrotic Amount: None Present (0%) Fat Layer (Subcutaneous Tissue) Exposed: Yes Tendon Exposed: No Muscle Exposed: No Joint Exposed: No Bone Exposed: No Treatment Notes Wound #2 (Lower Leg) Wound Laterality: Right, Circumferential Cleanser Peri-Wound Care Triamcinolone 15 (g) Discharge Instruction: Use triamcinolone 15 (g) as directed Sween Lotion (Moisturizing lotion) Discharge Instruction: Apply moisturizing lotion as directed Topical Primary Dressing KerraCel Ag Gelling Fiber Dressing, 4x5 in (silver alginate) Discharge Instruction: Apply silver alginate to wound bed as instructed Secondary Dressing ABD Pad, 8x10 Discharge Instruction: Apply over primary dressing as directed. Secured  With Compression Wrap FourPress (4 layer compression wrap) Discharge Instruction: Apply four layer compression as directed. May also use Miliken CoFlex 2 layer compression system as alternative. unna boot first layer Discharge Instruction: apply unna boot first layer at upper portion of lower leg to secure wrap in place. Compression Stockings Add-Ons Electronic Signature(s) Signed: 08/27/2022 5:13:32 PM By: [name] RN, BSN Entered By: [name] on 08/27/2022 12:59:28 -------------------------------------------------------------------------------- Vitals Details Patient Name: Date of Service: [name], [place] 08/27/2022 12:45 PM Medical Record Number: [id number] Patient Account Number: [id number] Date of Birth/Sex: Treating RN: 1950-11-01 (72 y.o. [name], [name] Primary Care Atilano Covelli: [name], [name] Other Clinician: Referring Sahira Cataldi: Treating Tajai Ihde/Extender: [name] [name], [name] Weeks in Treatment: 5 Vital  Signs Time Taken: 12:50 Temperature (F): 98.5 Height (in): 64 Pulse (bpm): 108 Weight (lbs): 325 Respiratory Rate (breaths/min): 20 Body Mass Index (BMI): 55.8 Blood Pressure (mmHg): 133/82 Capillary Blood Glucose (mg/dl): 197 Reference Range: 80 - 120 mg / dl Electronic Signature(s) Signed: 08/27/2022 4:41:46 PM By: [name] Entered By: [name] on 08/27/2022 12:51:08

## 2022-09-04 ENCOUNTER — Encounter (HOSPITAL_BASED_OUTPATIENT_CLINIC_OR_DEPARTMENT_OTHER): Payer: Medicare Other | Attending: Internal Medicine | Admitting: Internal Medicine

## 2022-09-04 DIAGNOSIS — C541 Malignant neoplasm of endometrium: Secondary | ICD-10-CM | POA: Diagnosis not present

## 2022-09-04 DIAGNOSIS — Z87891 Personal history of nicotine dependence: Secondary | ICD-10-CM | POA: Diagnosis not present

## 2022-09-04 DIAGNOSIS — L97812 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d: Secondary | ICD-10-CM

## 2022-09-04 DIAGNOSIS — I890 Lymphedema, not elsewhere classified: Secondary | ICD-10-CM | POA: Diagnosis not present

## 2022-09-04 DIAGNOSIS — L97822 Non-pressure chronic ulcer of other part of left lower leg with fat layer exposed: Secondary | ICD-10-CM | POA: Diagnosis not present

## 2022-09-04 DIAGNOSIS — Z794 Long term (current) use of insulin: Secondary | ICD-10-CM | POA: Insufficient documentation

## 2022-09-04 DIAGNOSIS — I87333 Chronic venous hypertension (idiopathic) with ulcer and inflammation of bilateral lower extremity: Secondary | ICD-10-CM | POA: Diagnosis not present

## 2022-09-04 DIAGNOSIS — E11622 Type 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: Secondary | ICD-10-CM | POA: Diagnosis not present

## 2022-09-04 NOTE — Progress Notes (Signed)
NYARI, OLSSON ([id number]) Visit Report for 09/04/2022 Chief Complaint Document Details Patient Name: Date of Service: [name], [name] [name] 09/04/2022 1:45 PM Medical Record Number: [id number] Patient Account Number: [id number] Date of Birth/Sex: Treating RN: [birth year] (72 y.o. [name] Primary Care Provider: [name], [name] Other Clinician: Referring Provider: Treating Provider/Extender: [name] [name], [name] Weeks in Treatment: 6 Information Obtained from: Patient Chief Complaint 07/19/2022; bilateral lower extremity wounds Electronic Signature(s) Signed: 09/04/2022 4:32:10 PM By: [name] DO Entered By: [name] on 09/04/2022 14:04:01 -------------------------------------------------------------------------------- HPI Details Patient Name: Date of Service: [name], [place] 09/04/2022 1:45 PM Medical Record Number: [id number] Patient Account Number: [id number] Date of Birth/Sex: Treating RN: Mar 01, 1950 (72 y.o. [name] Primary Care Provider: [name], [name] Other Clinician: Referring Provider: Treating Provider/Extender: [name] [name], [name] Weeks in Treatment: 6 History of Present Illness HPI Description: Admission 07/19/2022 [name] Is a 72 year old female with a past medical history of endometrial cancer, lymphedema, insulin-dependent type 2 diabetes that presents to the clinic for a 4-week history of weeping to her lower extremities bilaterally. She visited the ED for this issue on 07/07/2022 and she was prescribed doxycycline For bilateral lower extremity cellulitis. She has completed this course. She currently denies signs of infection. She does not have compression stockings. She states she recently gained weight over the past couple months. She has never had wounds before. 7/28; patient presents for follow-up. We have been using silver alginate under Kerlix/Coban. She is scheduled to get her  ABIs with TBI's on 8/7. She states that the wraps slid down to her mid shin. She denies signs of infection. 8/7; patient presents for follow-up. We have been using silver alginate under Kerlix/Coban. She had arterial studies done that showed an ABI of 0.86 on the right and 0.82 on the left with multiphasic waveforms throughout the lower extremities. 8/14; patient presents for follow-up. We have been using silver alginate under 3 layer compression. She tolerated the wrap well. She has no issues or complaints today. 8/21; patient presents for follow-up. We have been using silver alginate under 3 layer compression. She has no issues or complaints today. She has her juxta lite compressions with her. 8/28; patient presents for follow-up. We have been using silver alginate under 3 layer compression. She has no issues or complaints today. 9/5; patient presents for follow-up. We have been using silver alginate under 4 layer compression to both extremities bilaterally. She has her juxta lite compression with her today. Electronic Signature(s) Signed: 09/04/2022 4:32:10 PM By: [name] DO Entered By: [name] on 09/04/2022 14:29:20 -------------------------------------------------------------------------------- Physical Exam Details Patient Name: Date of Service: [name] 09/04/2022 1:45 PM Medical Record Number: [id number] Patient Account Number: [id number] Date of Birth/Sex: Treating RN: 1950-08-23 (72 y.o. [name] Primary Care Provider: [name], [name] Other Clinician: Referring Provider: Treating Provider/Extender: [name] [name], [name] Weeks in Treatment: 6 Constitutional respirations regular, non-labored and within target range for patient.. Cardiovascular 2+ dorsalis pedis/posterior tibialis pulses. Psychiatric pleasant and cooperative. Notes Left lower extremity: Epithelization to the previous wound site Right lower extremity: 1  wound to the anterior aspect with granulation tissue. Good edema control. Electronic Signature(s) Signed: 09/04/2022 4:32:10 PM By: [name] DO Entered By: [name] on 09/04/2022 14:27:40 -------------------------------------------------------------------------------- Physician Orders Details Patient Name: Date of Service: [name], [place] RIA [name] 09/04/2022 1:45 PM  Medical Record Number: [id number] Patient Account Number: [id number] Date of Birth/Sex: Treating RN: 1950-04-09 (72 y.o. [name] Primary Care Provider: [name], [name] Other Clinician: Referring Provider: Treating Provider/Extender: [name] [name], [name] Weeks in Treatment: 6 Verbal / Phone Orders: No Diagnosis Coding ICD-10 Coding Code Description [phone] 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d L97.822 Non-pressure chronic ulcer of other part of left lower leg with fat layer exposed I89.0 Lymphedema, not elsewhere classified I87.333 Chronic venous hypertension (idiopathic) with ulcer and inflammation of bilateral lower extremity E11.622 Type 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 Follow-up Appointments ppointment in 1 week. - Dr. Heber [place] and [place], Room 8 Tuesday Return A ppointment in 2 weeks. - Dr. Heber [place] and [place], Room 8 Tuesday Return A Other: - Bring in juxtalites HD weekly in case wound closure. Bathing/ Shower/ Hygiene May shower with protection but do not get wound dressing(s) wet. Edema Control - Lymphedema / SCD / Other Lymphedema Pumps. Use Lymphedema pumps on leg(s) 2-3 times a day for 45-60 minutes. If wearing any wraps or hose, do not remove them. Continue exercising as instructed. - 2-3 times a day throughout the day. Elevate legs to the level of the heart or above for 30 minutes daily and/or when sitting, a frequency of: - 3-4 times a day throughout the day. Avoid standing  for long periods of time. Exercise regularly Moisturize legs daily. - left leg every night before bed. Compression stocking or Garment 30-40 mm/Hg pressure to: - apply juxtalite HD to left leg- apply in the morning and remove at night. Wound Treatment Wound #2 - Lower Leg Wound Laterality: Right, Circumferential Peri-Wound Care: Triamcinolone 15 (g) 1 x Per Week/30 Days Discharge Instructions: Use triamcinolone 15 (g) as directed Peri-Wound Care: Sween Lotion (Moisturizing lotion) 1 x Per Week/30 Days Discharge Instructions: Apply moisturizing lotion as directed Prim Dressing: KerraCel Ag Gelling Fiber Dressing, 4x5 in (silver alginate) 1 x Per Week/30 Days ary Discharge Instructions: Apply silver alginate to wound bed as instructed Secondary Dressing: ABD Pad, 8x10 1 x Per Week/30 Days Discharge Instructions: Apply over primary dressing as directed. Compression Wrap: FourPress (4 layer compression wrap) 1 x Per Week/30 Days Discharge Instructions: Apply four layer compression as directed. May also use Miliken CoFlex 2 layer compression system as alternative. Electronic Signature(s) Signed: 09/04/2022 4:32:10 PM By: [name] DO Entered By: [name] on 09/04/2022 14:28:18 -------------------------------------------------------------------------------- Problem List Details Patient Name: Date of Service: [name], [name] RIA [name] 09/04/2022 1:45 PM Medical Record Number: [id number] Patient Account Number: [id number] Date of Birth/Sex: Treating RN: 1950-05-25 (72 y.o. [name], [name] Primary Care Provider: [name], [name] Other Clinician: Referring Provider: Treating Provider/Extender: [name] [name], [name] Weeks in Treatment: 6 Active Problems ICD-10 Encounter Code Description Active Date MDM Diagnosis L97.812 Non-pressure chronic ulcer of other part of right lower leg with fat layer 07/19/2022 No Yes exposed L97.822 Non-pressure chronic ulcer of  other part of left lower leg with fat layer exposed7/20/2023 No Yes I89.0 Lymphedema, not elsewhere classified 07/19/2022 No Yes I87.333 Chronic venous hypertension (idiopathic) with ulcer and inflammation of 07/19/2022 No Yes bilateral lower extremity E11.622 Type 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 07/19/2022 No Yes 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 07/19/2022 No Yes Inactive Problems Resolved Problems Electronic Signature(s) Signed: 09/04/2022 4:32:10 PM By: [name] DO Entered By: [name] on  09/04/2022 14:03:29 -------------------------------------------------------------------------------- Progress Note Details Patient Name: Date of Service: DORENE, BRUNI 09/04/2022 1:45 PM Medical Record Number: [id number] Patient Account Number: [id number] Date of Birth/Sex: Treating RN: [birth year] (72 y.o. [name] Primary Care Provider: [name], [name] Other Clinician: Referring Provider: Treating Provider/Extender: [name] [name], [name] Weeks in Treatment: 6 Subjective Chief Complaint Information obtained from Patient 07/19/2022; bilateral lower extremity wounds History of Present Illness (HPI) Admission 07/19/2022 Ms. Blanchie Zeleznik Is a 72 year old female with a past medical history of endometrial cancer, lymphedema, insulin-dependent type 2 diabetes that presents to the clinic for a 4-week history of weeping to her lower extremities bilaterally. She visited the ED for this issue on 07/07/2022 and she was prescribed doxycycline For bilateral lower extremity cellulitis. She has completed this course. She currently denies signs of infection. She does not have compression stockings. She states she recently gained weight over the past couple months. She has never had wounds before. 7/28; patient presents for follow-up. We have been using silver alginate under Kerlix/Coban. She is scheduled to get her ABIs with TBI's on 8/7. She states  that the wraps slid down to her mid shin. She denies signs of infection. 8/7; patient presents for follow-up. We have been using silver alginate under Kerlix/Coban. She had arterial studies done that showed an ABI of 0.86 on the right and 0.82 on the left with multiphasic waveforms throughout the lower extremities. 8/14; patient presents for follow-up. We have been using silver alginate under 3 layer compression. She tolerated the wrap well. She has no issues or complaints today. 8/21; patient presents for follow-up. We have been using silver alginate under 3 layer compression. She has no issues or complaints today. She has her juxta lite compressions with her. 8/28; patient presents for follow-up. We have been using silver alginate under 3 layer compression. She has no issues or complaints today. 9/5; patient presents for follow-up. We have been using silver alginate under 4 layer compression to both extremities bilaterally. She has her juxta lite compression with her today. Patient History Information obtained from Patient, Chart. Family History Cancer - Father, Diabetes - Siblings. Social History Former smoker - quit 1988, Marital Status - Married, Alcohol Use - Moderate - cocktails x2 a week, Drug Use - No History, Caffeine Use - Never. Medical History Hematologic/Lymphatic Patient has history of Anemia - iron Cardiovascular Patient has history of Hypertension Endocrine Patient has history of Type II Diabetes Musculoskeletal Patient has history of Gout, Osteoarthritis Oncologic Patient has history of Received Radiation - 2023 Hospitalization/Surgery History - one ovary removed 2020. - pilonidal cyst extraction 1969. - diverculitis temp colostomy. Medical A Surgical History Notes nd Cardiovascular hyperlipidemia Gastrointestinal GERD Diverticulitis Musculoskeletal carpel tunnel bilateral-surgery to right hand Oncologic endometrial cancer skin  Ca Objective Constitutional respirations regular, non-labored and within target range for patient.. Vitals Time Taken: 1:34 PM, Height: 64 in, Weight: 325 lbs, BMI: 55.8, Temperature: 99.5 F, Pulse: 99 bpm, Respiratory Rate: 20 breaths/min, Blood Pressure: 129/60 mmHg, Capillary Blood Glucose: 129 mg/dl. Cardiovascular 2+ dorsalis pedis/posterior tibialis pulses. Psychiatric pleasant and cooperative. General Notes: Left lower extremity: Epithelization to the previous wound site Right lower extremity: 1 wound to the anterior aspect with granulation tissue. Good edema control. Integumentary (Hair, Skin) Wound #1 status is Healed - Epithelialized. Original cause of wound was Gradually Appeared. The date acquired was: 07/05/2022. The wound has been in treatment 6 weeks. The wound is located on the Left,Circumferential Lower  Leg. The wound measures 0cm length x 0cm width x 0cm depth; 0cm^2 area and 0cm^3 volume. There is no tunneling or undermining noted. There is a none present amount of drainage noted. The wound margin is distinct with the outline attached to the wound base. There is no granulation within the wound bed. There is no necrotic tissue within the wound bed. Wound #2 status is Open. Original cause of wound was Gradually Appeared. The date acquired was: 07/05/2022. The wound has been in treatment 6 weeks. The wound is located on the Right,Circumferential Lower Leg. The wound measures 3cm length x 4.5cm width x 0.1cm depth; 10.603cm^2 area and 1.06cm^3 volume. There is Fat Layer (Subcutaneous Tissue) exposed. There is no tunneling or undermining noted. There is a medium amount of serosanguineous drainage noted. The wound margin is distinct with the outline attached to the wound base. There is large (67-100%) red, pink granulation within the wound bed. There is no necrotic tissue within the wound bed. Assessment Active Problems ICD-10 Non-pressure chronic ulcer of other part of right  lower leg with fat layer exposed Non-pressure chronic ulcer of other part of left lower leg with fat layer exposed Lymphedema, not elsewhere classified Chronic venous hypertension (idiopathic) with ulcer and inflammation of bilateral lower extremity Type 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 Malignant neoplasm of endometrium Patient has done well with silver alginate under compression therapy. Her left lower extremity wound has healed. I recommended using juxta light compression daily here. She still has 1 remaining wound to the right lower extremity. I recommended continuing silver alginate under 4-layer compression. Follow-up in 1 week Procedures Wound #2 Pre-procedure diagnosis of Wound #2 is a Lymphedema located on the Right,Circumferential Lower Leg . There was a Double Layer Compression Therapy Procedure by [name], RN. Post procedure Diagnosis Wound #2: Same as Pre-Procedure Plan Follow-up Appointments: Return Appointment in 1 week. - Dr. Heber [place] and [place], Room 8 Tuesday Return Appointment in 2 weeks. - Dr. Heber [place] and [place], Room 8 Tuesday Other: - Bring in juxtalites HD weekly in case wound closure. Bathing/ Shower/ Hygiene: May shower with protection but do not get wound dressing(s) wet. Edema Control - Lymphedema / SCD / Other: Lymphedema Pumps. Use Lymphedema pumps on leg(s) 2-3 times a day for 45-60 minutes. If wearing any wraps or hose, do not remove them. Continue exercising as instructed. - 2-3 times a day throughout the day. Elevate legs to the level of the heart or above for 30 minutes daily and/or when sitting, a frequency of: - 3-4 times a day throughout the day. Avoid standing for long periods of time. Exercise regularly Moisturize legs daily. - left leg every night before bed. Compression stocking or Garment 30-40 mm/Hg pressure to: - apply juxtalite HD to left leg- apply in the morning and remove at night. WOUND #2: - Lower Leg Wound Laterality: Right,  Circumferential Peri-Wound Care: Triamcinolone 15 (g) 1 x Per Week/30 Days Discharge Instructions: Use triamcinolone 15 (g) as directed Peri-Wound Care: Sween Lotion (Moisturizing lotion) 1 x Per Week/30 Days Discharge Instructions: Apply moisturizing lotion as directed Prim Dressing: KerraCel Ag Gelling Fiber Dressing, 4x5 in (silver alginate) 1 x Per Week/30 Days ary Discharge Instructions: Apply silver alginate to wound bed as instructed Secondary Dressing: ABD Pad, 8x10 1 x Per Week/30 Days Discharge Instructions: Apply over primary dressing as directed. Com pression Wrap: FourPress (4 layer compression wrap) 1 x Per Week/30 Days Discharge Instructions: Apply four layer compression as directed. May also use Miliken  CoFlex 2 layer compression system as alternative. 1. Silver alginate under 4-layer compression to the right lower extremity 2. Juxta light compression daily 3. Follow-up in 1 week Electronic Signature(s) Signed: 09/04/2022 4:32:10 PM By: [name] DO Entered By: [name] on 09/04/2022 14:30:34 -------------------------------------------------------------------------------- HxROS Details Patient Name: Date of Service: [name] [place], [place] RIA [name] 09/04/2022 1:45 PM Medical Record Number: [id number] Patient Account Number: [id number] Date of Birth/Sex: Treating RN: Jun 06, 1950 (72 y.o. [name] Primary Care Provider: [name], [name] Other Clinician: Referring Provider: Treating Provider/Extender: [name] [name], [name] Weeks in Treatment: 6 Information Obtained From Patient Chart Hematologic/Lymphatic Medical History: Positive for: Anemia - iron Cardiovascular Medical History: Positive for: Hypertension Past Medical History Notes: hyperlipidemia Gastrointestinal Medical History: Past Medical History Notes: GERD Diverticulitis Endocrine Medical History: Positive for: Type II Diabetes Time with diabetes: 72 years old Treated  with: Insulin, Diet Blood sugar tested every day: Yes Tested : three times a week. Musculoskeletal Medical History: Positive for: Gout; Osteoarthritis Past Medical History Notes: carpel tunnel bilateral-surgery to right hand Oncologic Medical History: Positive for: Received Radiation - 2023 Past Medical History Notes: endometrial cancer skin Ca Immunizations Pneumococcal Vaccine: Received Pneumococcal Vaccination: Yes Received Pneumococcal Vaccination On or After 60th Birthday: No Implantable Devices No devices added Hospitalization / Surgery History Type of Hospitalization/Surgery one ovary removed 2020 pilonidal cyst extraction 1969 diverculitis temp colostomy Family and Social History Cancer: Yes - Father; Diabetes: Yes - Siblings; Former smoker - quit 1988; Marital Status - Married; Alcohol Use: Moderate - cocktails x2 a week; Drug Use: No History; Caffeine Use: Never; Financial Concerns: No; Food, Clothing or Shelter Needs: No; Support System Lacking: No; Transportation Concerns: No Electronic Signature(s) Signed: 09/04/2022 4:32:10 PM By: [name] DO Signed: 09/04/2022 6:08:07 PM By: [name] RN, BSN Entered By: [name] on 09/04/2022 14:26:31 -------------------------------------------------------------------------------- SuperBill Details Patient Name: Date of Service: [name] 09/04/2022 Medical Record Number: [id number] Patient Account Number: [id number] Date of Birth/Sex: Treating RN: 06/29/1950 (72 y.o. [name] Primary Care Provider: [name], [name] Other Clinician: Referring Provider: Treating Provider/Extender: [name] [name], [name] Weeks in Treatment: 6 Diagnosis Coding ICD-10 Codes Code Description [id number] 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d L97.822 Non-pressure chronic ulcer of other part of left lower leg with fat layer exposed I89.0  Lymphedema, not elsewhere classified I87.333 Chronic venous hypertension (idiopathic) with ulcer and inflammation of bilateral lower extremity E11.622 Type 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 Facility Procedures CPT4 Code: [id number] (F Description: acility Use Only) [id number] - APPLY MULTLAY COMPRS LWR RT LEG Modifier: Quantity: 1 Physician Procedures : CPT4 Code Description Modifier [id number] - WC PHYS LEVEL 3 - EST PT ICD-10 Diagnosis Description Y65.993 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

## 2022-09-05 NOTE — Progress Notes (Signed)
[name], [name] ([id number]) Visit Report for 09/04/2022 Arrival Information Details Patient Name: Date of Service: [name], [name] [name] 09/04/2022 1:45 PM Medical Record Number: [id number] Patient Account Number: [id number] Date of Birth/Sex: Treating RN: 06/05/1950 (72 y.o. [name], [name] Primary Care [name]: [name], [name] Other Clinician: Referring [name]: Treating [name]/Extender: [name] [name], [name] Weeks in Treatment: 6 Visit Information History Since Last Visit Added or deleted any medications: No Patient Arrived: Wheel Chair Any new allergies or adverse reactions: No Arrival Time: 13:31 Had a fall or experienced change in No Accompanied By: self activities of daily living that may affect Transfer Assistance: None risk of falls: Patient Identification Verified: Yes Signs or symptoms of abuse/neglect since last visito No Secondary Verification Process Completed: Yes Hospitalized since last visit: No Patient Requires Transmission-Based No Implantable device outside of the clinic excluding No Precautions: cellular tissue based products placed in the center Patient Has Alerts: Yes since last visit: Patient Alerts: 08/06/2022 TBI L 0.59 R0.65 Has Compression in Place as Prescribed: Yes 08/06/2022 ABI R 0.86 Pain Present Now: No L0.82 Electronic Signature([name]) Signed: 09/05/2022 4:42:40 PM By: [name] Entered By: [name] on 09/04/2022 13:34:43 -------------------------------------------------------------------------------- Compression Therapy Details Patient Name: Date of Service: [name] 09/04/2022 1:45 PM Medical Record Number: [id number] Patient Account Number: [id number] Date of Birth/Sex: Treating RN: 02-06-1950 (72 y.o. [name] Primary Care [name]: [name], [name] Other Clinician: Referring Kristia Jupiter: Treating Jessieca [name]/Extender: [name] [name], [name] Weeks in Treatment:  6 Compression Therapy Performed for Wound Assessment: Wound #2 Right,Circumferential Lower Leg Performed By: Clinician [name] Pilling, RN Compression Type: Double Layer Post Procedure Diagnosis Same as Pre-procedure Electronic Signature([name]) Signed: 09/04/2022 6:08:07 PM By: [name] Pilling RN, [name] Entered By: [name] on 09/04/2022 13:49:37 -------------------------------------------------------------------------------- Encounter Discharge Information Details Patient Name: Date of Service: [name] 09/04/2022 1:45 PM Medical Record Number: [id number] Patient Account Number: [id number] Date of Birth/Sex: Treating RN: 21-Jul-1950 (72 y.o. [name] Primary Care Brighten Buzzelli: [name], [name] Other Clinician: Referring [name]: Treating [name]/Extender: [name] [name], [name] Weeks in Treatment: 6 Encounter Discharge Information Items Discharge Condition: Stable Ambulatory Status: Wheelchair Discharge Destination: Home Transportation: Private Auto Accompanied By: self Schedule Follow-up Appointment: Yes Clinical Summary of Care: Electronic Signature([name]) Signed: 09/04/2022 6:08:07 PM By: [name] Pilling RN, [name] Entered By: [name] on 09/04/2022 13:54:30 -------------------------------------------------------------------------------- Lower Extremity Assessment Details Patient Name: Date of Service: [name], [name] 09/04/2022 1:45 PM Medical Record Number: [id number] Patient Account Number: [id number] Date of Birth/Sex: Treating RN: 22-Sep-1950 (72 y.o. [name] Primary Care Breeley Bischof: [name], [name] Other Clinician: Referring Alyce Inscore: Treating [name]/Extender: [name] [name], [name] Weeks in Treatment: 6 Edema Assessment Assessed: [Left: Yes] [Right: Yes] Edema: [Left: Yes] [Right: Yes] Calf Left: Right: Point of Measurement: 39 cm From Medial Instep 50 cm 51 cm Ankle Left: Right: Point of  Measurement: 10 cm From Medial Instep 31 cm 29 cm Vascular Assessment Pulses: Dorsalis Pedis Palpable: [Left:Yes] [Right:Yes] Electronic Signature([name]) Signed: 09/04/2022 6:08:07 PM By: [name] Pilling RN, [name] Entered By: [name] on 09/04/2022 13:42:40 -------------------------------------------------------------------------------- Multi Wound Chart Details Patient Name: Date of Service: [name] 09/04/2022 1:45 PM Medical Record Number: [id number] Patient Account Number: [id number] Date of Birth/Sex: Treating RN: 1950-09-05 (72 y.o. [name] Primary Care [name]: [name], [name]  [name] Other Clinician: Referring [name]: Treating [name]/Extender: [name] [name], [name] Weeks in Treatment: 6 Vital Signs Height(in): 64 Capillary Blood Glucose(mg/dl): 129 Weight(lbs): 325 Pulse(bpm): 25 Body Mass Index(BMI): 55.8 Blood Pressure(mmHg): 129/60 Temperature(F): 99.5 Respiratory Rate(breaths/min): 20 Photos: [[name]:[name]] Left, Circumferential Lower Leg Right, Circumferential Lower Leg [name] Wound Location: Gradually Appeared Gradually Appeared [name] Wounding Event: Lymphedema Lymphedema [name] Primary Etiology: Anemia, Hypertension, Type II Anemia, Hypertension, Type II [name] Comorbid History: Diabetes, Gout, Osteoarthritis, Diabetes, Gout, Osteoarthritis, Received Radiation Received Radiation 07/05/2022 07/05/2022 [name] Date Acquired: 6 6 [name] Weeks of Treatment: Healed - Epithelialized Open [name] Wound Status: No No [name] Wound Recurrence: Yes Yes [name] Clustered Wound: 0 1 [name] Clustered Quantity: 0x0x0 3x4.5x0.1 [name] Measurements L x W x D (cm) 0 10.603 [name] A (cm) : rea 0 1.06 [name] Volume (cm) : 100.00% 97.30% [name] % Reduction in Area: 100.00% 97.30% [name] % Reduction in Volume: Full Thickness Without Exposed Full Thickness Without Exposed [name] Classification: Support Structures Support Structures None Present Medium [name] Exudate Amount: [name]  Serosanguineous [name] Exudate Type: [name] red, brown [name] Exudate Color: Distinct, outline attached Distinct, outline attached [name] Wound Margin: None Present (0%) Large (67-100%) [name] Granulation Amount: [name] Red, Pink [name] Granulation Quality: None Present (0%) None Present (0%) [name] Necrotic Amount: Fascia: No Fat Layer (Subcutaneous Tissue): Yes [name] Exposed Structures: Fat Layer (Subcutaneous Tissue): No Fascia: No Tendon: No Tendon: No Muscle: No Muscle: No Joint: No Joint: No Bone: No Bone: No Large (67-100%) Large (67-100%) [name] Epithelialization: [name] Compression Therapy [name] Procedures Performed: Treatment Notes Wound #2 (Lower Leg) Wound Laterality: Right, Circumferential Cleanser Peri-Wound Care Triamcinolone 15 (g) Discharge Instruction: Use triamcinolone 15 (g) as directed Sween Lotion (Moisturizing lotion) Discharge Instruction: Apply moisturizing lotion as directed Topical Primary Dressing KerraCel Ag Gelling Fiber Dressing, 4x5 in (silver alginate) Discharge Instruction: Apply silver alginate to wound bed as instructed Secondary Dressing ABD Pad, 8x10 Discharge Instruction: Apply over primary dressing as directed. Secured With Compression Wrap FourPress (4 layer compression wrap) Discharge Instruction: Apply four layer compression as directed. May also use Miliken CoFlex 2 layer compression system as alternative. Compression Stockings Add-Ons Electronic Signature([name]) Signed: 09/04/2022 4:32:10 PM By: [name] Shan DO Signed: 09/04/2022 6:08:07 PM By: [name] Pilling RN, [name] Entered By: [name] on 09/04/2022 14:03:52 -------------------------------------------------------------------------------- Multi-Disciplinary Care Plan Details Patient Name: Date of Service: [place], [name] RIA [name] 09/04/2022 1:45 PM Medical Record Number: [id number] Patient Account Number: [id number] Date of Birth/Sex: Treating RN: [birth year] (72 y.o. [name] Primary Care  Tryphena Perkovich: [name], [name] Other Clinician: Referring Jameeka Marcy: Treating Shon Indelicato/Extender: [name] [name], [name] Weeks in Treatment: 6 Active Inactive Pain, Acute or Chronic Nursing Diagnoses: Pain, acute or chronic: actual or potential Potential alteration in comfort, pain Goals: Patient will verbalize adequate pain control and receive pain control interventions during procedures as needed Date Initiated: 07/19/2022 Target Resolution Date: 09/28/2022 Goal Status: Active Patient/caregiver will verbalize comfort level met Date Initiated: 07/19/2022 Date Inactivated: 08/06/2022 Target Resolution Date: 08/06/2022 Goal Status: Met Interventions: Encourage patient to take pain medications as prescribed Provide education on pain management Reposition patient for comfort Treatment Activities: Administer pain control measures as ordered : 07/19/2022 Notes: Venous Leg Ulcer Nursing Diagnoses: Actual venous Insuffiency (use after diagnosis is confirmed) Potential for venous Insuffiency (use before diagnosis confirmed) Goals: Non-invasive venous studies are completed as ordered Date Initiated: 07/19/2022 Target Resolution Date: 09/28/2022 Goal Status: Active Interventions: Assess peripheral edema status every visit. Compression  as ordered Provide education on venous insufficiency Treatment Activities: Non-invasive vascular studies : 07/19/2022 Notes: Wound/Skin Impairment Nursing Diagnoses: Knowledge deficit related to ulceration/compromised skin integrity Goals: Patient/caregiver will verbalize understanding of skin care regimen Date Initiated: 07/19/2022 Target Resolution Date: 09/28/2022 Goal Status: Active Interventions: Assess patient/caregiver ability to perform ulcer/skin care regimen upon admission and as needed Assess ulceration([name]) every visit Provide education on ulcer and skin care Treatment Activities: Skin care regimen initiated :  07/19/2022 Topical wound management initiated : 07/19/2022 Notes: Electronic Signature([name]) Signed: 09/04/2022 6:08:07 PM By: [name] Pilling RN, [name] Entered By: [name] on 09/04/2022 13:48:58 -------------------------------------------------------------------------------- Pain Assessment Details Patient Name: Date of Service: [name] 09/04/2022 1:45 PM Medical Record Number: [id number] Patient Account Number: [id number] Date of Birth/Sex: Treating RN: 03/08/1950 (72 y.o. [name] Primary Care Courtlynn Holloman: [name], [name] Other Clinician: Referring Juni Glaab: Treating Ayiden Milliman/Extender: [name] [name], [name] Weeks in Treatment: 6 Active Problems Location of Pain Severity and Description of Pain Patient Has Paino No Site Locations Pain Management and Medication Current Pain Management: Electronic Signature([name]) Signed: 09/04/2022 6:08:07 PM By: [name] Pilling RN, [name] Entered By: [name] on 09/04/2022 13:42:12 -------------------------------------------------------------------------------- Patient/Caregiver Education Details Patient Name: Date of Service: [name] 9/5/2023andnbsp1:45 PM Medical Record Number: [id number] Patient Account Number: [id number] Date of Birth/Gender: Treating RN: 09-Sep-1950 (72 y.o. [name] Primary Care Physician: [name], [name] Other Clinician: Referring Physician: Treating Physician/Extender: [name] [name], [name] Weeks in Treatment: 6 Education Assessment Education Provided To: Patient Education Topics Provided Wound/Skin Impairment: Handouts: Skin Care Do'[name] and Dont'[name] Methods: Explain/Verbal Responses: Reinforcements needed Electronic Signature([name]) Signed: 09/04/2022 6:08:07 PM By: [name] Pilling RN, [name] Entered By: [name] on 09/04/2022 13:49:09 -------------------------------------------------------------------------------- Wound Assessment  Details Patient Name: Date of Service: [name], [name] 09/04/2022 1:45 PM Medical Record Number: [id number] Patient Account Number: [id number] Date of Birth/Sex: Treating RN: 1950/01/24 (72 y.o. [name], [name] Primary Care Kalyssa Anker: [name], [name] Other Clinician: Referring Antoinette Borgwardt: Treating Merdis Snodgrass/Extender: [name] [name], [name] Weeks in Treatment: 6 Wound Status Wound Number: 1 Primary Lymphedema Etiology: Wound Location: Left, Circumferential Lower Leg Wound Healed - Epithelialized Wounding Event: Gradually Appeared Status: Date Acquired: 07/05/2022 Comorbid Anemia, Hypertension, Type II Diabetes, Gout, Osteoarthritis, Weeks Of Treatment: 6 History: Received Radiation Clustered Wound: Yes Photos Wound Measurements Length: (cm) Width: (cm) Depth: (cm) Clustered Quantity: Area: (cm) Volume: (cm) 0 % Reduction in Area: 100% 0 % Reduction in Volume: 100% 0 Epithelialization: Large (67-100%) 0 Tunneling: No 0 Undermining: No 0 Wound Description Classification: Full Thickness Without Exposed Support Structures Wound Margin: Distinct, outline attached Exudate Amount: None Present Foul Odor After Cleansing: No Slough/Fibrino No Wound Bed Granulation Amount: None Present (0%) Exposed Structure Necrotic Amount: None Present (0%) Fascia Exposed: No Fat Layer (Subcutaneous Tissue) Exposed: No Tendon Exposed: No Muscle Exposed: No Joint Exposed: No Bone Exposed: No Electronic Signature([name]) Signed: 09/04/2022 6:08:07 PM By: [name] Pilling RN, [name] Entered By: [name] on 09/04/2022 13:43:30 -------------------------------------------------------------------------------- Wound Assessment Details Patient Name: Date of Service: [name], [name] 09/04/2022 1:45 PM Medical Record Number: [id number] Patient Account Number: [id number] Date of Birth/Sex: Treating RN: [birth year] (72 y.o. [name] Primary Care Samadhi Mahurin: [name],  [name] Other Clinician: Referring Jaren Kearn: Treating Maiyah Goyne/Extender: [name] [name], [name] Weeks in Treatment: 6 Wound Status Wound Number: 2 Primary  Lymphedema Etiology: Wound Location: Right, Circumferential Lower Leg Wound Open Wounding Event: Gradually Appeared Status: Date Acquired: 07/05/2022 Comorbid Anemia, Hypertension, Type II Diabetes, Gout, Osteoarthritis, Weeks Of Treatment: 6 History: Received Radiation Clustered Wound: Yes Photos Wound Measurements Length: (cm) 3 Width: (cm) 4.5 Depth: (cm) 0.1 Clustered Quantity: 1 Area: (cm) 10.603 Volume: (cm) 1.06 % Reduction in Area: 97.3% % Reduction in Volume: 97.3% Epithelialization: Large (67-100%) Tunneling: No Undermining: No Wound Description Classification: Full Thickness Without Exposed Support Structures Wound Margin: Distinct, outline attached Exudate Amount: Medium Exudate Type: Serosanguineous Exudate Color: red, brown Foul Odor After Cleansing: No Slough/Fibrino No Wound Bed Granulation Amount: Large (67-100%) Exposed Structure Granulation Quality: Red, Pink Fascia Exposed: No Necrotic Amount: None Present (0%) Fat Layer (Subcutaneous Tissue) Exposed: Yes Tendon Exposed: No Muscle Exposed: No Joint Exposed: No Bone Exposed: No Treatment Notes Wound #2 (Lower Leg) Wound Laterality: Right, Circumferential Cleanser Peri-Wound Care Triamcinolone 15 (g) Discharge Instruction: Use triamcinolone 15 (g) as directed Sween Lotion (Moisturizing lotion) Discharge Instruction: Apply moisturizing lotion as directed Topical Primary Dressing KerraCel Ag Gelling Fiber Dressing, 4x5 in (silver alginate) Discharge Instruction: Apply silver alginate to wound bed as instructed Secondary Dressing ABD Pad, 8x10 Discharge Instruction: Apply over primary dressing as directed. Secured With Compression Wrap FourPress (4 layer compression wrap) Discharge Instruction: Apply four layer  compression as directed. May also use Miliken CoFlex 2 layer compression system as alternative. Compression Stockings Add-Ons Electronic Signature([name]) Signed: 09/04/2022 6:08:07 PM By: [name] Pilling RN, [name] Entered By: [name] on 09/04/2022 13:43:08 -------------------------------------------------------------------------------- Vitals Details Patient Name: Date of Service: [name], [name] 09/04/2022 1:45 PM Medical Record Number: [id number] Patient Account Number: [id number] Date of Birth/Sex: Treating RN: 03-12-1950 (72 y.o. [name], [name] Primary Care Rudi Knippenberg: [name], [name] Other Clinician: Referring Drew Herman: Treating Dierdre Mccalip/Extender: [name] [name], [name] Weeks in Treatment: 6 Vital Signs Time Taken: 13:34 Temperature (F): 99.5 Height (in): 64 Pulse (bpm): 99 Weight (lbs): 325 Respiratory Rate (breaths/min): 20 Body Mass Index (BMI): 55.8 Blood Pressure (mmHg): 129/60 Capillary Blood Glucose (mg/dl): 129 Reference Range: 80 - 120 mg / dl Electronic Signature([name]) Signed: 09/05/2022 4:42:40 PM By: [name] Entered By: [name] on 09/04/2022 13:35:29

## 2022-09-10 ENCOUNTER — Encounter: Payer: Self-pay | Admitting: Internal Medicine

## 2022-09-10 ENCOUNTER — Ambulatory Visit: Payer: Medicare Other | Attending: Internal Medicine | Admitting: Internal Medicine

## 2022-09-10 VITALS — HR 88 | Ht 64.0 in | Wt 348.0 lb

## 2022-09-10 DIAGNOSIS — C541 Malignant neoplasm of endometrium: Secondary | ICD-10-CM | POA: Diagnosis not present

## 2022-09-10 DIAGNOSIS — R0600 Dyspnea, unspecified: Secondary | ICD-10-CM | POA: Diagnosis not present

## 2022-09-10 DIAGNOSIS — R2243 Localized swelling, mass and lump, lower limb, bilateral: Secondary | ICD-10-CM | POA: Diagnosis not present

## 2022-09-10 DIAGNOSIS — Z79899 Other long term (current) drug therapy: Secondary | ICD-10-CM | POA: Diagnosis not present

## 2022-09-10 DIAGNOSIS — I10 Essential (primary) hypertension: Secondary | ICD-10-CM | POA: Insufficient documentation

## 2022-09-10 DIAGNOSIS — E782 Mixed hyperlipidemia: Secondary | ICD-10-CM | POA: Diagnosis not present

## 2022-09-10 DIAGNOSIS — I4891 Unspecified atrial fibrillation: Secondary | ICD-10-CM | POA: Diagnosis not present

## 2022-09-10 DIAGNOSIS — I739 Peripheral vascular disease, unspecified: Secondary | ICD-10-CM | POA: Insufficient documentation

## 2022-09-10 DIAGNOSIS — E118 Type 2 diabetes mellitus with unspecified complications: Secondary | ICD-10-CM | POA: Insufficient documentation

## 2022-09-10 MED ORDER — APIXABAN 5 MG PO TABS: 5.0000 mg | ORAL_TABLET | Freq: Two times a day (BID) | ORAL | 3 refills | Status: DC

## 2022-09-10 NOTE — Progress Notes (Addendum)
Cardiology Office Note   Date:  09/11/2022   ID:  [name], DOB 1950-10-17, MRN [id number]  PCP:  Nickola Major, NP  Cardiologist:   Dorris Carnes, MD   Pt presents to establish care   Hx of HTN       History of Present Illness: [name] is a 72 y.o. female with a history of DM, uterine CA, HL, HTN and gout  She and her husband recently moved back to [place]    She had finished her XRT for endometrial cancer in [place]  The pt says in [place] he had an echo and also a stress test as part of evaluation prior to cancer treatment   Told were OK    (ADDENDUM:   REview of outside records, Echo March 2023 aortic sclerosis with mild AR, mild LVH, LVEF 60%); Myoview March 2023 Normal perfusion, LVEF normal   Reviewed Dec 2023)  Diagnosed with DM at age 46    Initially oral agents  Now insulin   Followed by Dr Buddy Duty here  Admits to  binging on carbs     She hs been treated in wound clinci for legs/ulcers    She denies CP   Does get SOB  Also notes coughing    Denies palpitations     Diet: Br    Bagal or english muffin   Tea with equal Lunch  Big meal   Meat/starch/veggie    Diet coke Teacher, music or butternt squash ravioli  Water  No snacks     Current Meds  Medication Sig   allopurinol (ZYLOPRIM) 300 MG tablet Take 1 tablet (300 mg total) by mouth daily.   amLODipine (NORVASC) 5 MG tablet TAKE 1 TABLET (5 MG TOTAL) BY MOUTH DAILY.   apixaban (ELIQUIS) 5 MG TABS tablet Take 1 tablet (5 mg total) by mouth 2 (two) times daily.   atorvastatin (LIPITOR) 80 MG tablet Take 1 tablet (80 mg total) by mouth at bedtime.   Carboxymethylcellul-Glycerin (LUBRICATING EYE DROPS OP) Place 1 drop into both eyes daily as needed (dry eyes).   carvedilol (COREG) 6.25 MG tablet Take 1 tablet (6.25 mg total) by mouth 2 (two) times daily with a meal.   Cholecalciferol (VITAMIN D3 PO) Take 1 capsule by mouth every evening.    Coenzyme Q10 (COQ10) 100 MG CAPS Take 100 mg by mouth every evening.    diphenhydrAMINE (BENADRYL) 25 mg capsule Take 50 mg by mouth at bedtime.   famotidine (PEPCID) 20 MG tablet Take 20 mg by mouth daily.    furosemide (LASIX) 20 MG tablet Take 1 tablet (20 mg total) by mouth daily.   GLUCOSAMINE-CHONDROITIN PO Take 1 tablet by mouth 2 (two) times daily.   insulin NPH-regular Human (70-30) 100 UNIT/ML injection Inject 70 Units into the skin daily. 70 units In the morning and 50 units in the evening   KRILL OIL PO Take 1 capsule by mouth daily.   losartan (COZAAR) 100 MG tablet Take 100 mg by mouth every evening.    meclizine (ANTIVERT) 12.5 MG tablet Take 1 tablet (12.5 mg total) by mouth 3 (three) times daily as needed for dizziness.   Multiple Vitamins-Minerals (MULTIVITAMIN WITH MINERALS) tablet Take 1 tablet by mouth daily.   oxybutynin (DITROPAN-XL) 10 MG 24 hr tablet Take 1 tablet (10 mg total) by mouth at bedtime.   potassium chloride SA (KLOR-CON M) 20 MEQ tablet Take 1 tablet (20 mEq total) by mouth daily.  Allergies:   Actos [pioglitazone], Metformin, Lisinopril, and Metformin and related   Past Medical History:  Diagnosis Date   Arthritis    Carpal tunnel syndrome    bilateral, had surgery on right hand   Diabetes mellitus without complication ([place])    Diverticulitis    Endometrial cancer ([place])    GERD (gastroesophageal reflux disease)    Gout    History of iron deficiency    Hyperlipidemia    Hypertension    Skin cancer     Past Surgical History:  Procedure Laterality Date   CARPAL TUNNEL RELEASE Right    COLON SURGERY     COLONOSCOPY     x2   COLOSTOMY     COLOSTOMY REVERSAL     DILATATION & CURETTAGE/HYSTEROSCOPY WITH MYOSURE N/A 07/17/2019   Procedure: DILATATION & CURETTAGE/HYSTEROSCOPY WITH MYOSURE;  Surgeon: Louretta Shorten, MD;  Location: [facility];  Service: Gynecology;  Laterality: N/A;   OOPHORECTOMY     unilateral   PILONIDAL CYST EXCISION     ROBOTIC ASSISTED TOTAL HYSTERECTOMY WITH BILATERAL SALPINGO  OOPHERECTOMY N/A 08/20/2019   Procedure: DIAGNSOTIC LAPAROSCOPY, DILATION AND CURETTAGE, IUD PLACEMENT ;  Surgeon: Everitt Amber, MD;  Location: [facility];  Service: Gynecology;  Laterality: N/A;   SKIN CANCER EXCISION       Social History:  The patient  reports that she quit smoking about 35 years ago. Her smoking use included cigarettes. She has a 66.00 pack-year smoking history. She has never used smokeless tobacco. She reports current alcohol use. She reports that she does not use drugs.   Family History:  [name] family history includes Diabetes in her sister; Skin cancer in her father.    ROS:  Please see the history of present illness. All other systems are reviewed and  Negative to the above problem except as noted.    PHYSICAL EXAM: VS:  Pulse 88   Ht '5\' 4"'$  (1.626 m)   Wt (!) 348 lb (157.9 kg)   SpO2 98%   BMI 59.73 kg/m   GEN: Morbidly obese 72 yo in  no acute distress   Examined in chair HEENT: normal  Neck: no JVD, carotid bruits Cardiac: RRR; no murmurs,  R leg wrapped    L leg with bandage  + edema  Respiratory:  clear to auscultation bilaterally GI: soft, nontender, obese   MS: no deformity Moving all extremities   Skin: warm and dry, no rash Neuro:  Strength and sensation are intact Psych: euthymic mood, full affect   EKG:  EKG is ordered today.  Atrial fibrillaton  88 bpm     Lipid Panel    Component Value Date/Time   CHOL 187 07/23/2022 0000   TRIG 177 (A) 07/23/2022 0000   HDL 31 (A) 07/23/2022 0000   LDLCALC 124 07/23/2022 0000      Wt Readings from Last 3 Encounters:  09/10/22 (!) 348 lb (157.9 kg)  08/07/22 (!) 340 lb (154.2 kg)  07/30/22 300 lb (136.1 kg)      ASSESSMENT AND PLAN:  1  Atrial fibrillation   THis is a new Dx for her    WIll get a holter monitor   Confirm thryoid OK   Would recomm Eliquis 5 mg bid        2  HTN  BP has been adequately controlled on last visit  3 Dyspnea.   Will get echo to have images here.  Check BMET  and BNP   Exam  difficult     4  HL   Will get a lipomed     3 DM   Counselled on diet   Lower carb   Veggies      Current medicines are reviewed at length with the patient today.  The patient does not have concerns regarding medicines.  Signed, Dorris Carnes, MD  09/11/2022 12:41 PM    [facility] [address], [place], [place]  [address] Phone: [phone]; Fax: [phone]

## 2022-09-10 NOTE — Patient Instructions (Signed)
Medication Instructions:   START ELIQUIS 5 MG TWICE A DAY WITH NO MISSED DOSES.   *If you need a refill on your cardiac medications before your next appointment, please call your pharmacy*   Lab Work: TSH, FREE T3. FREE T4, BMET, PRO BNP, NMR  If you have labs (blood work) drawn today and your tests are completely normal, you will receive your results only by: MyChart Message (if you have MyChart) OR A paper copy in the mail If you have any lab test that is abnormal or we need to change your treatment, we will call you to review the results.   Testing/Procedures: Your physician has requested that you have an echocardiogram. Echocardiography is a painless test that uses sound waves to create images of your heart. It provides your doctor with information about the size and shape of your heart and how well your heart's chambers and valves are working. This procedure takes approximately one hour. There are no restrictions for this procedure.  ZIO XT- Long Term Monitor Instructions  Your physician has requested you wear a ZIO patch monitor for 3 days.  This is a single patch monitor. [employer] supplies one patch monitor per enrollment. Additional stickers are not available. Please do not apply patch if you will be having a Nuclear Stress Test,  Echocardiogram, Cardiac CT, MRI, or Chest Xray during the period you would be wearing the  monitor. The patch cannot be worn during these tests. You cannot remove and re-apply the  ZIO XT patch monitor.  Your ZIO patch monitor will be mailed 3 day USPS to your address on file. It may take 3-5 days  to receive your monitor after you have been enrolled.  Once you have received your monitor, please review the enclosed instructions. Your monitor  has already been registered assigning a specific monitor serial # to you.  Billing and Patient Assistance Program Information  We have supplied [employer] with any of your insurance information on file for billing  purposes. [employer] offers a sliding scale Patient Assistance Program for patients that do not have  insurance, or whose insurance does not completely cover the cost of the ZIO monitor.  You must apply for the Patient Assistance Program to qualify for this discounted rate.  To apply, please call [employer] at [phone], select option 4, select option 2, ask to apply for  Patient Assistance Program. Theodore Demark will ask your household income, and how many people  are in your household. They will quote your out-of-pocket cost based on that information.  [employer] will also be able to set up a 28-month interest-free payment plan if needed.  Applying the monitor   Shave hair from upper left chest.  Hold abrader disc by orange tab. Rub abrader in 40 strokes over the upper left chest as  indicated in your monitor instructions.  Clean area with 4 enclosed alcohol pads. Let dry.  Apply patch as indicated in monitor instructions. Patch will be placed under collarbone on left  side of chest with arrow pointing upward.  Rub patch adhesive wings for 2 minutes. Remove white label marked "1". Remove the white  label marked "2". Rub patch adhesive wings for 2 additional minutes.  While looking in a mirror, press and release button in center of patch. A small green light will  flash 3-4 times. This will be your only indicator that the monitor has been turned on.  Do not shower for the first 24 hours. You may shower after the first 24  hours.  Press the button if you feel a symptom. You will hear a small click. Record Date, Time and  Symptom in the Patient Logbook.  When you are ready to remove the patch, follow instructions on the last 2 pages of Patient  Logbook. Stick patch monitor onto the last page of Patient Logbook.  Place Patient Logbook in the blue and white box. Use locking tab on box and tape box closed  securely. The blue and white box has prepaid postage on it. Please place it in the mailbox as  soon  as possible. Your physician should have your test results approximately 7 days after the  monitor has been mailed back to [facility].  Call [place] at [phone] if you have questions regarding  your ZIO XT patch monitor. Call them immediately if you see an orange light blinking on your  monitor.  If your monitor falls off in less than 4 days, contact our Monitor department at [phone].  If your monitor becomes loose or falls off after 4 days call [employer] at [phone] for  suggestions on securing your monitor    Follow-Up: At [facility], you and your health needs are our priority.  As part of our continuing mission to provide you with exceptional heart care, we have created designated Provider Care Teams.  These Care Teams include your primary Cardiologist (physician) and Advanced Practice Providers (APPs -  Physician Assistants and Nurse Practitioners) who all work together to provide you with the care you need, when you need it.  We recommend signing up for the patient portal called "MyChart".  Sign up information is provided on this After Visit Summary.  MyChart is used to connect with patients for Virtual Visits (Telemedicine).  Patients are able to view lab/test results, encounter notes, upcoming appointments, etc.  Non-urgent messages can be sent to your provider as well.   To learn more about what you can do with MyChart, go to [email or web address].     Important Information About Sugar

## 2022-09-11 ENCOUNTER — Encounter (HOSPITAL_BASED_OUTPATIENT_CLINIC_OR_DEPARTMENT_OTHER): Payer: Medicare Other | Admitting: Internal Medicine

## 2022-09-11 ENCOUNTER — Ambulatory Visit: Payer: Medicare Other | Attending: Internal Medicine

## 2022-09-11 DIAGNOSIS — C541 Malignant neoplasm of endometrium: Secondary | ICD-10-CM | POA: Diagnosis not present

## 2022-09-11 DIAGNOSIS — E782 Mixed hyperlipidemia: Secondary | ICD-10-CM | POA: Insufficient documentation

## 2022-09-11 DIAGNOSIS — I4891 Unspecified atrial fibrillation: Secondary | ICD-10-CM | POA: Insufficient documentation

## 2022-09-11 DIAGNOSIS — L97822 Non-pressure chronic ulcer of other part of left lower leg with fat layer exposed: Secondary | ICD-10-CM

## 2022-09-11 DIAGNOSIS — E11622 Type 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: Secondary | ICD-10-CM | POA: Diagnosis not present

## 2022-09-11 DIAGNOSIS — I890 Lymphedema, not elsewhere classified: Secondary | ICD-10-CM | POA: Diagnosis not present

## 2022-09-11 DIAGNOSIS — I10 Essential (primary) hypertension: Secondary | ICD-10-CM | POA: Insufficient documentation

## 2022-09-11 DIAGNOSIS — I87333 Chronic venous hypertension (idiopathic) with ulcer and inflammation of bilateral lower extremity: Secondary | ICD-10-CM | POA: Diagnosis not present

## 2022-09-11 DIAGNOSIS — Z79899 Other long term (current) drug therapy: Secondary | ICD-10-CM | POA: Insufficient documentation

## 2022-09-11 DIAGNOSIS — L97812 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d: Secondary | ICD-10-CM | POA: Diagnosis not present

## 2022-09-11 DIAGNOSIS — E118 Type 2 diabetes mellitus with unspecified complications: Secondary | ICD-10-CM | POA: Insufficient documentation

## 2022-09-11 DIAGNOSIS — I739 Peripheral vascular disease, unspecified: Secondary | ICD-10-CM | POA: Insufficient documentation

## 2022-09-11 LAB — BASIC METABOLIC PANEL
BUN/Creatinine Ratio: 14 (ref 12–28)
BUN: 14 mg/dL (ref 8–27)
CO2: 22 mmol/L (ref 20–29)
Calcium: 9.6 mg/dL (ref 8.7–10.3)
Chloride: 103 mmol/L (ref 96–106)
Creatinine, Ser: 0.99 mg/dL (ref 0.57–1.00)
Glucose: 169 mg/dL — ABNORMAL HIGH (ref 70–99)
Potassium: 4.4 mmol/L (ref 3.5–5.2)
Sodium: 142 mmol/L (ref 134–144)
eGFR: 60 mL/min/{1.73_m2} (ref >59)

## 2022-09-11 LAB — NMR, LIPOPROFILE
Cholesterol, Total: 100 mg/dL (ref 100–199)
HDL Particle Number: 19.5 umol/L — ABNORMAL LOW (ref >=30.5)
HDL-C: 28 mg/dL — ABNORMAL LOW (ref >39)
LDL Particle Number: 911 nmol/L (ref <1000)
LDL Size: 19.7 nm — ABNORMAL LOW (ref >20.5)
LDL-C (NIH Calc): 47 mg/dL (ref 0–99)
LP-IR Score: 46 — ABNORMAL HIGH (ref <=45)
Small LDL Particle Number: 673 nmol/L — ABNORMAL HIGH (ref <=527)
Triglycerides: 140 mg/dL (ref 0–149)

## 2022-09-11 LAB — TSH: TSH: 6.02 u[IU]/mL — ABNORMAL HIGH (ref 0.450–4.500)

## 2022-09-11 LAB — T4, FREE: Free T4: 1 ng/dL (ref 0.82–1.77)

## 2022-09-11 LAB — T3, FREE: T3, Free: 2.5 pg/mL (ref 2.0–4.4)

## 2022-09-11 LAB — PRO B NATRIURETIC PEPTIDE: NT-Pro BNP: 1665 pg/mL — ABNORMAL HIGH (ref 0–301)

## 2022-09-11 NOTE — Progress Notes (Unsigned)
Enrolled for [employer] to mail a ZIO XT long term holter monitor to the patients address on file.  

## 2022-09-11 NOTE — Progress Notes (Signed)
[name], [name] ([id number]) Visit Report for 09/11/2022 Arrival Information Details Patient Name: Date of Service: [place], [name] 09/11/2022 12:45 PM Medical Record Number: [id number] Patient Account Number: [id number] Date of Birth/Sex: Treating RN: [birth year] (72 y.o. [name], [name] Primary Care Perlie Scheuring: [name], [name] Other Clinician: Referring Jensyn Cambria: Treating Valrie Jia/Extender: [name] [name], [name] Weeks in Treatment: 7 Visit Information History Since Last Visit Added or deleted any medications: Yes Patient Arrived: Wheel Chair Any new allergies or adverse reactions: No Arrival Time: 12:42 Had a fall or experienced change in No Accompanied By: self activities of daily living that may affect Transfer Assistance: None risk of falls: Patient Identification Verified: Yes Signs or symptoms of abuse/neglect since last visito No Secondary Verification Process Completed: Yes Hospitalized since last visit: No Patient Requires Transmission-Based No Implantable device outside of the clinic excluding No Precautions: cellular tissue based products placed in the center Patient Has Alerts: Yes since last visit: Patient Alerts: Patient on Blood Thinner Has Dressing in Place as Prescribed: Yes 08/06/2022 TBI L 0.59 R0.65 Has Compression in Place as Prescribed: Yes 08/06/2022 ABI R 0.86 Pain Present Now: No L0.82 Notes per patient unable to to get juxtalite HD on even with husband aid. [name] made aware. Seen cardiologist yesterday EKG, dx with A.fib now on eliquis. Per patient has not been taking lasix daily as prescribed only when not going out of the house does she take her medication. [name] made aware. Electronic Signature(s) Signed: 09/11/2022 5:16:45 PM By: [name] RN, BSN Entered By: [name] on 09/11/2022 12:57:17 -------------------------------------------------------------------------------- Compression Therapy Details Patient  Name: Date of Service: [name] 09/11/2022 12:45 PM Medical Record Number: [id number] Patient Account Number: [id number] Date of Birth/Sex: Treating RN: 1950/11/25 (72 y.o. [name] Primary Care Tarynn Garling: [name], [name] Other Clinician: Referring Aleece Loyd: Treating Nijah Orlich/Extender: [name] [name], [name] Weeks in Treatment: 7 Compression Therapy Performed for Wound Assessment: Wound #1R Left,Circumferential Lower Leg Performed By: Clinician [name], RN Compression Type: Double Layer Post Procedure Diagnosis Same as Pre-procedure Electronic Signature(s) Signed: 09/11/2022 5:16:45 PM By: [name] RN, BSN Entered By: [name] on 09/11/2022 13:08:29 -------------------------------------------------------------------------------- Compression Therapy Details Patient Name: Date of Service: [name] 09/11/2022 12:45 PM Medical Record Number: [id number] Patient Account Number: [id number] Date of Birth/Sex: Treating RN: 07-25-1950 (72 y.o. [name] Primary Care Jibril Mcminn: [name], [name] Other Clinician: Referring Rutherford Alarie: Treating Rashid Whitenight/Extender: [name] [name], [name] Weeks in Treatment: 7 Compression Therapy Performed for Wound Assessment: Wound #2 Right,Circumferential Lower Leg Performed By: Clinician [name], RN Compression Type: Double Layer Post Procedure Diagnosis Same as Pre-procedure Electronic Signature(s) Signed: 09/11/2022 5:16:45 PM By: [name] RN, BSN Entered By: [name] on 09/11/2022 13:08:29 -------------------------------------------------------------------------------- Encounter Discharge Information Details Patient Name: Date of Service: [name] 09/11/2022 12:45 PM Medical Record Number: [id number] Patient Account Number: [id number] Date of Birth/Sex: Treating RN: 1950/03/01 (72 y.o. [name] Primary Care Almedia Cordell: MEDINA-V A  RGA S, [name] Other Clinician: Referring Wissam Resor: Treating Mohogany Toppins/Extender: [name] [name], [name] Weeks in Treatment: 7 Encounter Discharge Information Items Discharge Condition: Stable Ambulatory Status: Wheelchair Discharge Destination: Home Transportation: Private Auto Accompanied By: self Schedule Follow-up Appointment: Yes Clinical Summary of Care: Electronic Signature(s) Signed: 09/11/2022 5:16:45 PM By: [name] RN, BSN Entered By: [name] on 09/11/2022 17:14:50 -------------------------------------------------------------------------------- Lower  Extremity Assessment Details Patient Name: Date of Service: [name], [name] 09/11/2022 12:45 PM Medical Record Number: [id number] Patient Account Number: [id number] Date of Birth/Sex: Treating RN: 1950/10/06 (72 y.o. [name] Primary Care Mohamud Mrozek: [name], [name] Other Clinician: Referring Kerianna Rawlinson: Treating Kerri-Anne Haeberle/Extender: [name] [name], [name] Weeks in Treatment: 7 Edema Assessment Assessed: [Left: Yes] [Right: Yes] Edema: [Left: Yes] [Right: Yes] Calf Left: Right: Point of Measurement: 39 cm From Medial Instep 50 cm 51 cm Ankle Left: Right: Point of Measurement: 10 cm From Medial Instep 31 cm 30 cm Vascular Assessment Pulses: Dorsalis Pedis Palpable: [Left:Yes] [Right:Yes] Electronic Signature(s) Signed: 09/11/2022 5:16:45 PM By: [name] RN, BSN Entered By: [name] on 09/11/2022 12:51:49 -------------------------------------------------------------------------------- Multi Wound Chart Details Patient Name: Date of Service: [name] 09/11/2022 12:45 PM Medical Record Number: [id number] Patient Account Number: [id number] Date of Birth/Sex: Treating RN: 08/05/1950 (72 y.o. F) Primary Care Rebeka Kimble: [name], [name] Other Clinician: Referring Hadiyah Maricle: Treating Miguel Medal/Extender: [name] [facility]-V A RGA  S, [name] Weeks in Treatment: 7 Vital Signs Height(in): 64 Capillary Blood Glucose(mg/dl): 129 Weight(lbs): 325 Pulse(bpm): 15 Body Mass Index(BMI): 55.8 Blood Pressure(mmHg): 188/69 Temperature(F): 98.4 Respiratory Rate(breaths/min): 26 Photos: [[name]:[name]] Left, Circumferential Lower Leg Right, Circumferential Lower Leg [name] Wound Location: Gradually Appeared Gradually Appeared [name] Wounding Event: Lymphedema Lymphedema [name] Primary Etiology: Anemia, Arrhythmia, Hypertension, Anemia, Arrhythmia, Hypertension, [name] Comorbid History: Type II Diabetes, Gout, Osteoarthritis, Type II Diabetes, Gout, Osteoarthritis, Received Radiation Received Radiation 07/05/2022 07/05/2022 [name] Date Acquired: 7 7 [name] Weeks of Treatment: Open Open [name] Wound Status: Yes No [name] Wound Recurrence: Yes Yes [name] Clustered Wound: 3 1 [name] Clustered Quantity: 8x9x0.1 3x3.5x0.1 [name] Measurements L x W x D (cm) 56.549 8.247 [name] A (cm) : rea 5.655 0.825 [name] Volume (cm) : 85.80% 97.90% [name] % Reduction in Area: 85.80% 97.90% [name] % Reduction in Volume: Full Thickness Without Exposed Full Thickness Without Exposed [name] Classification: Support Structures Support Structures Large Medium [name] Exudate Amount: Serous Serosanguineous [name] Exudate Type: amber red, brown [name] Exudate Color: Distinct, outline attached Distinct, outline attached [name] Wound Margin: Large (67-100%) Large (67-100%) [name] Granulation Amount: Red, Pink, Pale Red, Pink [name] Granulation Quality: None Present (0%) None Present (0%) [name] Necrotic Amount: Fat Layer (Subcutaneous Tissue): Yes Fat Layer (Subcutaneous Tissue): Yes [name] Exposed Structures: Fascia: No Fascia: No Tendon: No Tendon: No Muscle: No Muscle: No Joint: No Joint: No Bone: No Bone: No Small (1-33%) Large (67-100%) [name] Epithelialization: Compression Therapy Compression Therapy [name] Procedures Performed: Treatment Notes Electronic Signature(s) Signed: 09/11/2022  1:27:00 PM By: [name] DO Entered By: [name] on 09/11/2022 13:08:56 -------------------------------------------------------------------------------- Multi-Disciplinary Care Plan Details Patient Name: Date of Service: [place], [name] RIA [name] 09/11/2022 12:45 PM Medical Record Number: [id number] Patient Account Number: [id number] Date of Birth/Sex: Treating RN: May 19, 1950 (72 y.o. [name] Primary Care Ellinore Merced: [name], [name] Other Clinician: Referring Gal Smolinski: Treating Jakylan Ron/Extender: [name] [name], [name] Weeks in Treatment: 7 Active Inactive Pain, Acute or Chronic Nursing Diagnoses: Pain, acute or chronic: actual or potential Potential alteration in comfort, pain Goals: Patient will verbalize adequate pain control and receive pain control interventions during procedures as needed Date Initiated: 07/19/2022 Target Resolution Date: 09/28/2022 Goal Status: Active Patient/caregiver will verbalize comfort level met Date Initiated: 07/19/2022 Date Inactivated: 08/06/2022 Target Resolution Date: 08/06/2022 Goal Status: Met Interventions: Encourage patient to take pain medications as prescribed Provide education on  pain management Reposition patient for comfort Treatment Activities: Administer pain control measures as ordered : 07/19/2022 Notes: Venous Leg Ulcer Nursing Diagnoses: Actual venous Insuffiency (use after diagnosis is confirmed) Potential for venous Insuffiency (use before diagnosis confirmed) Goals: Non-invasive venous studies are completed as ordered Date Initiated: 07/19/2022 Target Resolution Date: 09/28/2022 Goal Status: Active Interventions: Assess peripheral edema status every visit. Compression as ordered Provide education on venous insufficiency Treatment Activities: Non-invasive vascular studies : 07/19/2022 Notes: Wound/Skin Impairment Nursing Diagnoses: Knowledge deficit related to  ulceration/compromised skin integrity Goals: Patient/caregiver will verbalize understanding of skin care regimen Date Initiated: 07/19/2022 Target Resolution Date: 09/28/2022 Goal Status: Active Interventions: Assess patient/caregiver ability to perform ulcer/skin care regimen upon admission and as needed Assess ulceration(s) every visit Provide education on ulcer and skin care Treatment Activities: Skin care regimen initiated : 07/19/2022 Topical wound management initiated : 07/19/2022 Notes: Electronic Signature(s) Signed: 09/11/2022 5:16:45 PM By: [name] RN, BSN Entered By: [name] on 09/11/2022 12:57:34 -------------------------------------------------------------------------------- Pain Assessment Details Patient Name: Date of Service: [name] 09/11/2022 12:45 PM Medical Record Number: [id number] Patient Account Number: [id number] Date of Birth/Sex: Treating RN: [birth year] (72 y.o. [name] Primary Care Brownie Nehme: [name], [name] Other Clinician: Referring Daly Whipkey: Treating Perley Arthurs/Extender: [name] [name], [name] Weeks in Treatment: 7 Active Problems Location of Pain Severity and Description of Pain Patient Has Paino No Site Locations Rate the pain. Rate the pain. Current Pain Level: 0 Pain Management and Medication Current Pain Management: Medication: No Cold Application: No Rest: No Massage: No Activity: No T.E.[name].S.: No Heat Application: No Leg drop or elevation: No Is the Current Pain Management Adequate: Adequate How does your wound impact your activities of daily livingo Sleep: No Bathing: No Appetite: No Relationship With Others: No Bladder Continence: No Emotions: No Bowel Continence: No Work: No Toileting: No Drive: No Dressing: No Hobbies: No Engineer, maintenance) Signed: 09/11/2022 5:16:45 PM By: [name] RN, BSN Entered By: [name] on 09/11/2022  12:43:42 -------------------------------------------------------------------------------- Patient/Caregiver Education Details Patient Name: Date of Service: [name] 9/12/2023andnbsp12:45 PM Medical Record Number: [id number] Patient Account Number: [id number] Date of Birth/Gender: Treating RN: 08/09/1950 (72 y.o. [name] Primary Care Physician: [name], [name] Other Clinician: Referring Physician: Treating Physician/Extender: [name] [name], [name] Weeks in Treatment: 7 Education Assessment Education Provided To: Patient Education Topics Provided Wound/Skin Impairment: Handouts: Skin Care Do's and Dont's Methods: Explain/Verbal Responses: Reinforcements needed Electronic Signature(s) Signed: 09/11/2022 5:16:45 PM By: [name] RN, BSN Entered By: [name] on 09/11/2022 13:03:41 -------------------------------------------------------------------------------- Wound Assessment Details Patient Name: Date of Service: [name] 09/11/2022 12:45 PM Medical Record Number: [id number] Patient Account Number: [id number] Date of Birth/Sex: Treating RN: 09/04/1950 (72 y.o. [name] Primary Care Wallice Granville: [name], [name] Other Clinician: Referring Damean Poffenberger: Treating Shai Rasmussen/Extender: [name] [name], [name] Weeks in Treatment: 7 Wound Status Wound Number: 1R Primary Lymphedema Etiology: Wound Location: Left, Circumferential Lower Leg Wound Open Wounding Event: Gradually Appeared Status: Date Acquired: 07/05/2022 Comorbid Anemia, Arrhythmia, Hypertension, Type II Diabetes, Gout, Weeks Of Treatment: 7 History: Osteoarthritis, Received Radiation Clustered Wound: Yes Photos Wound Measurements Length: (cm) 8 Width: (cm) 9 Depth: (cm) 0.1 Clustered Quantity: 3 Area: (cm) 56 Volume: (cm) 5. % Reduction in Area: 85.8% % Reduction in Volume: 85.8% Epithelialization: Small  (1-33%) Tunneling: No .549 Undermining: No 655 Wound Description Classification: Full Thickness  Without Exposed Support Stru Wound Margin: Distinct, outline attached Exudate Amount: Large Exudate Type: Serous Exudate Color: amber ctures Foul Odor After Cleansing: No Slough/Fibrino No Wound Bed Granulation Amount: Large (67-100%) Exposed Structure Granulation Quality: Red, Pink, Pale Fascia Exposed: No Necrotic Amount: None Present (0%) Fat Layer (Subcutaneous Tissue) Exposed: Yes Tendon Exposed: No Muscle Exposed: No Joint Exposed: No Bone Exposed: No Treatment Notes Wound #1R (Lower Leg) Wound Laterality: Left, Circumferential Cleanser Peri-Wound Care Triamcinolone 15 (g) Discharge Instruction: Use triamcinolone 15 (g) as directed Sween Lotion (Moisturizing lotion) Discharge Instruction: Apply moisturizing lotion as directed Topical Primary Dressing KerraCel Ag Gelling Fiber Dressing, 4x5 in (silver alginate) Discharge Instruction: Apply silver alginate to wound bed as instructed Secondary Dressing ABD Pad, 8x10 Discharge Instruction: Apply over primary dressing as directed. Secured With Compression Wrap CoFlex TLC XL 2-layer Compression System 4x7 (in/yd) Discharge Instruction: Apply CoFlex 2-layer compression as directed. (alt for 4 layer) Compression Stockings Add-Ons Electronic Signature(s) Signed: 09/11/2022 5:16:45 PM By: [name] RN, BSN Entered By: [name] on 09/11/2022 12:56:20 -------------------------------------------------------------------------------- Wound Assessment Details Patient Name: Date of Service: [name], [name] RIA [name] 09/11/2022 12:45 PM Medical Record Number: [id number] Patient Account Number: [id number] Date of Birth/Sex: Treating RN: [birth year] (72 y.o. [name], [name] Primary Care Desiderio Dolata: [name], [name] Other Clinician: Referring Deetya Drouillard: Treating Darelle Kings/Extender: [name] [name], [place]  [place] Weeks in Treatment: 7 Wound Status Wound Number: 2 Primary Lymphedema Etiology: Wound Location: Right, Circumferential Lower Leg Wound Open Wounding Event: Gradually Appeared Status: Date Acquired: 07/05/2022 Comorbid Anemia, Arrhythmia, Hypertension, Type II Diabetes, Gout, Weeks Of Treatment: 7 History: Osteoarthritis, Received Radiation Clustered Wound: Yes Photos Wound Measurements Length: (cm) 3 Width: (cm) 3.5 Depth: (cm) 0.1 Clustered Quantity: 1 Area: (cm) 8.247 Volume: (cm) 0.825 % Reduction in Area: 97.9% % Reduction in Volume: 97.9% Epithelialization: Large (67-100%) Undermining: No Wound Description Classification: Full Thickness Without Exposed Support Structures Wound Margin: Distinct, outline attached Exudate Amount: Medium Exudate Type: Serosanguineous Exudate Color: red, brown Foul Odor After Cleansing: No Slough/Fibrino No Wound Bed Granulation Amount: Large (67-100%) Exposed Structure Granulation Quality: Red, Pink Fascia Exposed: No Necrotic Amount: None Present (0%) Fat Layer (Subcutaneous Tissue) Exposed: Yes Tendon Exposed: No Muscle Exposed: No Joint Exposed: No Bone Exposed: No Treatment Notes Wound #2 (Lower Leg) Wound Laterality: Right, Circumferential Cleanser Peri-Wound Care Triamcinolone 15 (g) Discharge Instruction: Use triamcinolone 15 (g) as directed Sween Lotion (Moisturizing lotion) Discharge Instruction: Apply moisturizing lotion as directed Topical Primary Dressing KerraCel Ag Gelling Fiber Dressing, 4x5 in (silver alginate) Discharge Instruction: Apply silver alginate to wound bed as instructed Secondary Dressing ABD Pad, 8x10 Discharge Instruction: Apply over primary dressing as directed. Secured With Compression Wrap CoFlex TLC XL 2-layer Compression System 4x7 (in/yd) Discharge Instruction: Apply CoFlex 2-layer compression as directed. (alt for 4 layer) Compression Stockings Add-Ons Electronic  Signature(s) Signed: 09/11/2022 5:16:45 PM By: [name] RN, BSN Entered By: [name] on 09/11/2022 12:56:38 -------------------------------------------------------------------------------- Vitals Details Patient Name: Date of Service: [name], [name] 09/11/2022 12:45 PM Medical Record Number: [id number] Patient Account Number: [id number] Date of Birth/Sex: Treating RN: 06/14/1950 (72 y.o. [name], [name] Primary Care Neythan Kozlov: [name], [name] Other Clinician: Referring Tevis Dunavan: Treating Ellesse Antenucci/Extender: [name] [name], [name] Weeks in Treatment: 7 Vital Signs Time Taken: 12:48 Temperature (F): 98.4 Height (in): 64 Pulse (bpm): 79 Weight (lbs): 325 Respiratory Rate (breaths/min): 26 Body Mass Index (BMI): 55.8 Blood Pressure (mmHg): 188/69 Capillary Blood  Glucose (mg/dl): 129 Reference Range: 80 - 120 mg / dl Notes SHOB Electronic Signature(s) Signed: 09/11/2022 5:16:45 PM By: [name] RN, BSN Entered By: [name] on 09/11/2022 13:03:29

## 2022-09-12 NOTE — Progress Notes (Signed)
ELISHA, MCGRUDER ([id number]) Visit Report for 09/11/2022 Chief Complaint Document Details Patient Name: Date of Service: [place], [name] 09/11/2022 12:45 PM Medical Record Number: [id number] Patient Account Number: [id number] Date of Birth/Sex: Treating RN: [birth year] (72 y.o. F) Primary Care Provider: [name], [name] Other Clinician: Referring Provider: Treating Provider/Extender: [name] [name], [name] Weeks in Treatment: 7 Information Obtained from: Patient Chief Complaint 07/19/2022; bilateral lower extremity wounds Electronic Signature(s) Signed: 09/11/2022 1:27:00 PM By: [name] DO Entered By: [name] on 09/11/2022 13:09:04 -------------------------------------------------------------------------------- HPI Details Patient Name: Date of Service: [name], [place] RIA N 09/11/2022 12:45 PM Medical Record Number: [id number] Patient Account Number: [id number] Date of Birth/Sex: Treating RN: 1950/11/12 (72 y.o. F) Primary Care Provider: [name], [name] Other Clinician: Referring Provider: Treating Provider/Extender: [name] [name], [name] Weeks in Treatment: 7 History of Present Illness HPI Description: Admission 07/19/2022 Ms. Malissa Slay Is a 72 year old female with a past medical history of endometrial cancer, lymphedema, insulin-dependent type 2 diabetes that presents to the clinic for a 4-week history of weeping to her lower extremities bilaterally. She visited the ED for this issue on 07/07/2022 and she was prescribed doxycycline For bilateral lower extremity cellulitis. She has completed this course. She currently denies signs of infection. She does not have compression stockings. She states she recently gained weight over the past couple months. She has never had wounds before. 7/28; patient presents for follow-up. We have been using silver alginate under Kerlix/Coban. She is scheduled to get her ABIs with TBI's on  8/7. She states that the wraps slid down to her mid shin. She denies signs of infection. 8/7; patient presents for follow-up. We have been using silver alginate under Kerlix/Coban. She had arterial studies done that showed an ABI of 0.86 on the right and 0.82 on the left with multiphasic waveforms throughout the lower extremities. 8/14; patient presents for follow-up. We have been using silver alginate under 3 layer compression. She tolerated the wrap well. She has no issues or complaints today. 8/21; patient presents for follow-up. We have been using silver alginate under 3 layer compression. She has no issues or complaints today. She has her juxta lite compressions with her. 8/28; patient presents for follow-up. We have been using silver alginate under 3 layer compression. She has no issues or complaints today. 9/5; patient presents for follow-up. We have been using silver alginate under 4 layer compression to both extremities bilaterally. She has her juxta lite compression with her today. 9/12; patient presents for follow-up. We have been using silver alginate under 4-layer compression to the right leg. She was supposed to wear her juxta lite compression to the left lower extremity. She states she could not get it on so she has not been wearing it. She also has not been taking her Lasix. Electronic Signature(s) Signed: 09/11/2022 1:27:00 PM By: [name] DO Signed: 09/11/2022 1:27:00 PM By: [name] DO Entered By: [name] on 09/11/2022 13:10:35 -------------------------------------------------------------------------------- Physical Exam Details Patient Name: Date of Service: [name] 09/11/2022 12:45 PM Medical Record Number: [id number] Patient Account Number: [id number] Date of Birth/Sex: Treating RN: [birth year] (72 y.o. F) Primary Care Provider: [name], [name] Other Clinician: Referring Provider: Treating Provider/Extender: [name] [name], [name] Weeks in Treatment: 7 Constitutional respirations regular, non-labored and within target range for patient.Marland Kitchen Respiratory clear to auscultation bilaterally. Cardiovascular 2+ dorsalis pedis/posterior tibialis pulses.  Psychiatric pleasant and cooperative. Notes Wounds and weeping to the lower extremities bilaterally. 3+ pitting edema to the knee. In no acute distress No signs of surrounding soft tissue infection Electronic Signature(s) Signed: 09/11/2022 1:27:00 PM By: [name] DO Entered By: [name] on 09/11/2022 13:22:41 -------------------------------------------------------------------------------- Physician Orders Details Patient Name: Date of Service: [name], [name] RIA N 09/11/2022 12:45 PM Medical Record Number: [id number] Patient Account Number: [id number] Date of Birth/Sex: Treating RN: 1950-02-11 (72 y.o. [name] Primary Care Provider: [name], [name] Other Clinician: Referring Provider: Treating Provider/Extender: [name] [name], [name] Weeks in Treatment: 7 Verbal / Phone Orders: No Diagnosis Coding ICD-10 Coding Code Description [phone] 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d L97.822 Non-pressure chronic ulcer of other part of left lower leg with fat layer exposed I89.0 Lymphedema, not elsewhere classified I87.333 Chronic venous hypertension (idiopathic) with ulcer and inflammation of bilateral lower extremity E11.622 Type 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 Follow-up Appointments ppointment in 1 week. - Dr. Heber [place] and [place], Room 8 Tuesday Return A ppointment in 2 weeks. - Dr. Heber [place] and [place], Room 8 Tuesday Return A Other: - take the lasix daily as prescribed. If shortness of breath increases you need to go to the emergency department. Bathing/ Shower/ Hygiene May shower with protection  but do not get wound dressing(s) wet. Edema Control - Lymphedema / SCD / Other Lymphedema Pumps. Use Lymphedema pumps on leg(s) 2-3 times a day for 45-60 minutes. If wearing any wraps or hose, do not remove them. Continue exercising as instructed. - 2-3 times a day throughout the day. Elevate legs to the level of the heart or above for 30 minutes daily and/or when sitting, a frequency of: - 3-4 times a day throughout the day. Avoid standing for long periods of time. Exercise regularly Wound Treatment Wound #1R - Lower Leg Wound Laterality: Left, Circumferential Peri-Wound Care: Triamcinolone 15 (g) 1 x Per Week/30 Days Discharge Instructions: Use triamcinolone 15 (g) as directed Peri-Wound Care: Sween Lotion (Moisturizing lotion) 1 x Per Week/30 Days Discharge Instructions: Apply moisturizing lotion as directed Prim Dressing: KerraCel Ag Gelling Fiber Dressing, 4x5 in (silver alginate) 1 x Per Week/30 Days ary Discharge Instructions: Apply silver alginate to wound bed as instructed Secondary Dressing: ABD Pad, 8x10 1 x Per Week/30 Days Discharge Instructions: Apply over primary dressing as directed. Compression Wrap: CoFlex TLC XL 2-layer Compression System 4x7 (in/yd) 1 x Per Week/30 Days Discharge Instructions: Apply CoFlex 2-layer compression as directed. (alt for 4 layer) Wound #2 - Lower Leg Wound Laterality: Right, Circumferential Peri-Wound Care: Triamcinolone 15 (g) 1 x Per Week/30 Days Discharge Instructions: Use triamcinolone 15 (g) as directed Peri-Wound Care: Sween Lotion (Moisturizing lotion) 1 x Per Week/30 Days Discharge Instructions: Apply moisturizing lotion as directed Prim Dressing: KerraCel Ag Gelling Fiber Dressing, 4x5 in (silver alginate) 1 x Per Week/30 Days ary Discharge Instructions: Apply silver alginate to wound bed as instructed Secondary Dressing: ABD Pad, 8x10 1 x Per Week/30 Days Discharge Instructions: Apply over primary dressing as directed. Compression  Wrap: CoFlex TLC XL 2-layer Compression System 4x7 (in/yd) 1 x Per Week/30 Days Discharge Instructions: Apply CoFlex 2-layer compression as directed. (alt for 4 layer) Electronic Signature(s) Signed: 09/11/2022 1:27:00 PM By: [name] DO Entered By: [name] on 09/11/2022 13:22:47 -------------------------------------------------------------------------------- Problem List Details Patient Name: Date of Service: [name], [place] RIA N 09/11/2022 12:45 PM Medical  Record Number: [id number] Patient Account Number: [id number] Date of Birth/Sex: Treating RN: 1950-08-18 (72 y.o. [name], [name] Primary Care Provider: [name], [name] Other Clinician: Referring Provider: Treating Provider/Extender: [name] [name], [name] Weeks in Treatment: 7 Active Problems ICD-10 Encounter Code Description Active Date MDM Diagnosis L97.812 Non-pressure chronic ulcer of other part of right lower leg with fat layer 07/19/2022 No Yes exposed L97.822 Non-pressure chronic ulcer of other part of left lower leg with fat layer exposed7/20/2023 No Yes I89.0 Lymphedema, not elsewhere classified 07/19/2022 No Yes I87.333 Chronic venous hypertension (idiopathic) with ulcer and inflammation of 07/19/2022 No Yes bilateral lower extremity E11.622 Type 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 07/19/2022 No Yes 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 07/19/2022 No Yes Inactive Problems Resolved Problems Electronic Signature(s) Signed: 09/11/2022 1:27:00 PM By: [name] DO Entered By: [name] on 09/11/2022 13:08:51 -------------------------------------------------------------------------------- Progress Note Details Patient Name: Date of Service: [name], [place] RIA N 09/11/2022 12:45 PM Medical Record Number: [id number] Patient Account Number: [id number] Date of Birth/Sex: Treating RN: Nov 19, 1950 (72 y.o. F) Primary Care Provider: [name], [name] Other  Clinician: Referring Provider: Treating Provider/Extender: [name] [name], [name] Weeks in Treatment: 7 Subjective Chief Complaint Information obtained from Patient 07/19/2022; bilateral lower extremity wounds History of Present Illness (HPI) Admission 07/19/2022 Ms. Krisa Blattner Is a 72 year old female with a past medical history of endometrial cancer, lymphedema, insulin-dependent type 2 diabetes that presents to the clinic for a 4-week history of weeping to her lower extremities bilaterally. She visited the ED for this issue on 07/07/2022 and she was prescribed doxycycline For bilateral lower extremity cellulitis. She has completed this course. She currently denies signs of infection. She does not have compression stockings. She states she recently gained weight over the past couple months. She has never had wounds before. 7/28; patient presents for follow-up. We have been using silver alginate under Kerlix/Coban. She is scheduled to get her ABIs with TBI's on 8/7. She states that the wraps slid down to her mid shin. She denies signs of infection. 8/7; patient presents for follow-up. We have been using silver alginate under Kerlix/Coban. She had arterial studies done that showed an ABI of 0.86 on the right and 0.82 on the left with multiphasic waveforms throughout the lower extremities. 8/14; patient presents for follow-up. We have been using silver alginate under 3 layer compression. She tolerated the wrap well. She has no issues or complaints today. 8/21; patient presents for follow-up. We have been using silver alginate under 3 layer compression. She has no issues or complaints today. She has her juxta lite compressions with her. 8/28; patient presents for follow-up. We have been using silver alginate under 3 layer compression. She has no issues or complaints today. 9/5; patient presents for follow-up. We have been using silver alginate under 4 layer compression to both  extremities bilaterally. She has her juxta lite compression with her today. 9/12; patient presents for follow-up. We have been using silver alginate under 4-layer compression to the right leg. She was supposed to wear her juxta lite compression to the left lower extremity. She states she could not get it on so she has not been wearing it. She also has not been taking her Lasix. Patient History Information obtained from Patient, Chart. Family History Cancer - Father, Diabetes - Siblings. Social History Former smoker - quit 1988, Marital Status - Married, Alcohol Use - Moderate - cocktails x2 a  week, Drug Use - No History, Caffeine Use - Never. Medical History Hematologic/Lymphatic Patient has history of Anemia - iron Cardiovascular Patient has history of Arrhythmia - A. Fib 09/10/2022 dx started eliquis, Hypertension Endocrine Patient has history of Type II Diabetes Musculoskeletal Patient has history of Gout, Osteoarthritis Oncologic Patient has history of Received Radiation - 2023 Hospitalization/Surgery History - one ovary removed 2020. - pilonidal cyst extraction 1969. - diverculitis temp colostomy. Medical A Surgical History Notes nd Cardiovascular hyperlipidemia Gastrointestinal GERD Diverticulitis Musculoskeletal carpel tunnel bilateral-surgery to right hand Oncologic endometrial cancer skin Ca Objective Constitutional respirations regular, non-labored and within target range for patient.. Vitals Time Taken: 12:48 PM, Height: 64 in, Weight: 325 lbs, BMI: 55.8, Temperature: 98.4 F, Pulse: 79 bpm, Respiratory Rate: 26 breaths/min, Blood Pressure: 188/69 mmHg, Capillary Blood Glucose: 129 mg/dl. General Notes: SHOB Respiratory clear to auscultation bilaterally. Cardiovascular 2+ dorsalis pedis/posterior tibialis pulses. Psychiatric pleasant and cooperative. General Notes: Wounds and weeping to the lower extremities bilaterally. 3+ pitting edema to the knee. In no acute  distress No signs of surrounding soft tissue infection Integumentary (Hair, Skin) Wound #1R status is Open. Original cause of wound was Gradually Appeared. The date acquired was: 07/05/2022. The wound has been in treatment 7 weeks. The wound is located on the Left,Circumferential Lower Leg. The wound measures 8cm length x 9cm width x 0.1cm depth; 56.549cm^2 area and 5.655cm^3 volume. There is Fat Layer (Subcutaneous Tissue) exposed. There is no tunneling or undermining noted. There is a large amount of serous drainage noted. The wound margin is distinct with the outline attached to the wound base. There is large (67-100%) red, pink, pale granulation within the wound bed. There is no necrotic tissue within the wound bed. Wound #2 status is Open. Original cause of wound was Gradually Appeared. The date acquired was: 07/05/2022. The wound has been in treatment 7 weeks. The wound is located on the Right,Circumferential Lower Leg. The wound measures 3cm length x 3.5cm width x 0.1cm depth; 8.247cm^2 area and 0.825cm^3 volume. There is Fat Layer (Subcutaneous Tissue) exposed. There is no undermining noted. There is a medium amount of serosanguineous drainage noted. The wound margin is distinct with the outline attached to the wound base. There is large (67-100%) red, pink granulation within the wound bed. There is no necrotic tissue within the wound bed. Assessment Active Problems ICD-10 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d Non-pressure chronic ulcer of other part of left lower leg with fat layer exposed Lymphedema, not elsewhere classified Chronic venous hypertension (idiopathic) with ulcer and inflammation of bilateral lower extremity Type 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 Malignant neoplasm of endometrium Patient's right lower extremity wounds are stable. She has weeping now to the left lower extremity. She has not been wearing compression garments.  We will use silver alginate under 4-layer compression to her lower extremities bilaterally. She does report slight increase in shortness of breath over the past week but this has not worsened Over the past several days. She saw her cardiologist yesterday who is obtaining blood work and an echo. I recommended she restart her Lasix as she Has significant edema to her lower extremities. No crackles noted on lung exam. I did recommend that she go to the ED if she were to experience increased shortness of breath. Procedures Wound #1R Pre-procedure diagnosis of Wound #1R is a Lymphedema located on the Left,Circumferential Lower Leg . There was a Double Layer Compression Therapy Procedure by Rolin Barry,  Tammi Klippel, Therapist, sports. Post procedure Diagnosis Wound #1R: Same as Pre-Procedure Wound #2 Pre-procedure diagnosis of Wound #2 is a Lymphedema located on the Right,Circumferential Lower Leg . There was a Double Layer Compression Therapy Procedure by [name], RN. Post procedure Diagnosis Wound #2: Same as Pre-Procedure Plan Follow-up Appointments: Return Appointment in 1 week. - Dr. Heber [place] and [place], Room 8 Tuesday Return Appointment in 2 weeks. - Dr. Heber [place] and [place], Room 8 Tuesday Other: - take the lasix daily as prescribed. If shortness of breath increases you need to go to the emergency department. Bathing/ Shower/ Hygiene: May shower with protection but do not get wound dressing(s) wet. Edema Control - Lymphedema / SCD / Other: Lymphedema Pumps. Use Lymphedema pumps on leg(s) 2-3 times a day for 45-60 minutes. If wearing any wraps or hose, do not remove them. Continue exercising as instructed. - 2-3 times a day throughout the day. Elevate legs to the level of the heart or above for 30 minutes daily and/or when sitting, a frequency of: - 3-4 times a day throughout the day. Avoid standing for long periods of time. Exercise regularly WOUND #1R: - Lower Leg Wound Laterality: Left,  Circumferential Peri-Wound Care: Triamcinolone 15 (g) 1 x Per Week/30 Days Discharge Instructions: Use triamcinolone 15 (g) as directed Peri-Wound Care: Sween Lotion (Moisturizing lotion) 1 x Per Week/30 Days Discharge Instructions: Apply moisturizing lotion as directed Prim Dressing: KerraCel Ag Gelling Fiber Dressing, 4x5 in (silver alginate) 1 x Per Week/30 Days ary Discharge Instructions: Apply silver alginate to wound bed as instructed Secondary Dressing: ABD Pad, 8x10 1 x Per Week/30 Days Discharge Instructions: Apply over primary dressing as directed. Com pression Wrap: CoFlex TLC XL 2-layer Compression System 4x7 (in/yd) 1 x Per Week/30 Days Discharge Instructions: Apply CoFlex 2-layer compression as directed. (alt for 4 layer) WOUND #2: - Lower Leg Wound Laterality: Right, Circumferential Peri-Wound Care: Triamcinolone 15 (g) 1 x Per Week/30 Days Discharge Instructions: Use triamcinolone 15 (g) as directed Peri-Wound Care: Sween Lotion (Moisturizing lotion) 1 x Per Week/30 Days Discharge Instructions: Apply moisturizing lotion as directed Prim Dressing: KerraCel Ag Gelling Fiber Dressing, 4x5 in (silver alginate) 1 x Per Week/30 Days ary Discharge Instructions: Apply silver alginate to wound bed as instructed Secondary Dressing: ABD Pad, 8x10 1 x Per Week/30 Days Discharge Instructions: Apply over primary dressing as directed. Com pression Wrap: CoFlex TLC XL 2-layer Compression System 4x7 (in/yd) 1 x Per Week/30 Days Discharge Instructions: Apply CoFlex 2-layer compression as directed. (alt for 4 layer) 1. Silver alginate under 4-layer compression 2. Follow-up in 1 week Electronic Signature(s) Signed: 09/11/2022 1:27:00 PM By: [name] DO Entered By: [name] on 09/11/2022 13:25:47 -------------------------------------------------------------------------------- HxROS Details Patient Name: Date of Service: [name], [place] RIA N 09/11/2022 12:45 PM Medical Record  Number: [id number] Patient Account Number: [id number] Date of Birth/Sex: Treating RN: [birth year] (72 y.o. [name] Primary Care Provider: [name], [name] Other Clinician: Referring Provider: Treating Provider/Extender: [name] [name], [name] Weeks in Treatment: 7 Information Obtained From Patient Chart Hematologic/Lymphatic Medical History: Positive for: Anemia - iron Cardiovascular Medical History: Positive for: Arrhythmia - A. Fib 09/10/2022 dx started eliquis; Hypertension Past Medical History Notes: hyperlipidemia Gastrointestinal Medical History: Past Medical History Notes: GERD Diverticulitis Endocrine Medical History: Positive for: Type II Diabetes Time with diabetes: 72 years old Treated with: Insulin, Diet Blood sugar tested every day: Yes Tested : three times a week. Musculoskeletal Medical History: Positive for: Gout; Osteoarthritis Past Medical History  Notes: carpel tunnel bilateral-surgery to right hand Oncologic Medical History: Positive for: Received Radiation - 2023 Past Medical History Notes: endometrial cancer skin Ca Immunizations Pneumococcal Vaccine: Received Pneumococcal Vaccination: Yes Received Pneumococcal Vaccination On or After 60th Birthday: No Implantable Devices No devices added Hospitalization / Surgery History Type of Hospitalization/Surgery one ovary removed 2020 pilonidal cyst extraction 1969 diverculitis temp colostomy Family and Social History Cancer: Yes - Father; Diabetes: Yes - Siblings; Former smoker - quit 1988; Marital Status - Married; Alcohol Use: Moderate - cocktails x2 a week; Drug Use: No History; Caffeine Use: Never; Financial Concerns: No; Food, Clothing or Shelter Needs: No; Support System Lacking: No; Transportation Concerns: No Electronic Signature(s) Signed: 09/11/2022 1:27:00 PM By: [name] DO Signed: 09/11/2022 5:16:45 PM By: [name] RN, BSN Entered By:  [name] on 09/11/2022 13:10:40 -------------------------------------------------------------------------------- SuperBill Details Patient Name: Date of Service: [name], [place] 09/11/2022 Medical Record Number: [id number] Patient Account Number: [id number] Date of Birth/Sex: Treating RN: 1950/05/09 (72 y.o. F) Primary Care Provider: [name], [name] Other Clinician: Referring Provider: Treating Provider/Extender: [name] [name], [name] Weeks in Treatment: 7 Diagnosis Coding ICD-10 Codes Code Description [phone] 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d L97.822 Non-pressure chronic ulcer of other part of left lower leg with fat layer exposed I89.0 Lymphedema, not elsewhere classified I87.333 Chronic venous hypertension (idiopathic) with ulcer and inflammation of bilateral lower extremity E11.622 Type 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 Facility Procedures CPT4: Code [id number] Description: 81 BILATERAL: Application of multi-layer venous compression system; leg (below knee), including ankle and t. Modifier: Quantity: 1 Physician Procedures : CPT4 Code Description Modifier [id number] - WC PHYS LEVEL 3 - EST PT ICD-10 Diagnosis Description Y60.630 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d L97.822 Non-pressure chronic ulcer of other part of left  lower leg with fat layer exposed I89.0 Lymphedema, not elsewhere classified I87.333 Chronic venous hypertension (idiopathic) with ulcer and inflammation of bilateral lower extremity Quantity: 1 Electronic Signature(s) Signed: 09/11/2022 5:16:45 PM By: [name] RN, BSN Signed: 09/12/2022 11:45:21 PM By: [name] DO Previous Signature: 09/11/2022 1:27:00 PM Version By: [name] DO Entered By: [name] on 09/11/2022 17:13:50

## 2022-09-13 ENCOUNTER — Telehealth: Payer: Self-pay

## 2022-09-13 ENCOUNTER — Encounter: Payer: Self-pay | Admitting: Internal Medicine

## 2022-09-13 DIAGNOSIS — Z79899 Other long term (current) drug therapy: Secondary | ICD-10-CM

## 2022-09-13 DIAGNOSIS — I4891 Unspecified atrial fibrillation: Secondary | ICD-10-CM

## 2022-09-13 DIAGNOSIS — E118 Type 2 diabetes mellitus with unspecified complications: Secondary | ICD-10-CM | POA: Diagnosis not present

## 2022-09-13 DIAGNOSIS — E782 Mixed hyperlipidemia: Secondary | ICD-10-CM | POA: Diagnosis not present

## 2022-09-13 DIAGNOSIS — I739 Peripheral vascular disease, unspecified: Secondary | ICD-10-CM

## 2022-09-13 DIAGNOSIS — I10 Essential (primary) hypertension: Secondary | ICD-10-CM

## 2022-09-13 MED ORDER — POTASSIUM CHLORIDE CRYS ER 20 MEQ PO TBCR: EXTENDED_RELEASE_TABLET | ORAL | 3 refills | Status: DC

## 2022-09-13 MED ORDER — FUROSEMIDE 20 MG PO TABS: ORAL_TABLET | ORAL | 1 refills | Status: DC

## 2022-09-13 NOTE — Telephone Encounter (Signed)
My Chart sent to the pt re: her results... unable to leave a message.

## 2022-09-13 NOTE — Telephone Encounter (Signed)
-----   Message from Dorris Carnes V, MD sent at 09/12/2022  1:33 PM EDT ----- Kidney function is OK    Glucose 169   Cut back on carbs Thyroid function overall is OK      Fluid is up some   Can try lasix 40 mg with 10 KCL 2x per week

## 2022-09-14 MED ORDER — FUROSEMIDE 40 MG PO TABS: 40.0000 mg | ORAL_TABLET | ORAL | 3 refills | Status: DC

## 2022-09-14 MED ORDER — POTASSIUM CHLORIDE ER 10 MEQ PO TBCR: 10.0000 meq | EXTENDED_RELEASE_TABLET | ORAL | 3 refills | Status: DC

## 2022-09-14 NOTE — Telephone Encounter (Signed)
After talking with Dr Harrington Challenger.. pt to take Lasix 40 mg and KCL 10 meq po QOD.   Repeat labs 09/27/22. ''Pt aware and verbalized understanding.

## 2022-09-14 NOTE — Telephone Encounter (Signed)
Called the pt to let her know that I will speak to Dr Harrington Challenger to clarify her Lasix/ K dosing.... her med list was not up to date... he med list says she was taking lasix 20 mg daily and she is actually only taking Lasix 40 mg when she needed it and she was not leaving the house... I will be sure Dr Harrington Challenger only wants BIW dosing and no 20 mg daily.

## 2022-09-18 ENCOUNTER — Encounter (HOSPITAL_BASED_OUTPATIENT_CLINIC_OR_DEPARTMENT_OTHER): Payer: Medicare Other | Admitting: Internal Medicine

## 2022-09-18 DIAGNOSIS — L97812 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d: Secondary | ICD-10-CM

## 2022-09-18 DIAGNOSIS — L97822 Non-pressure chronic ulcer of other part of left lower leg with fat layer exposed: Secondary | ICD-10-CM | POA: Diagnosis not present

## 2022-09-18 DIAGNOSIS — E11622 Type 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: Secondary | ICD-10-CM | POA: Diagnosis not present

## 2022-09-18 DIAGNOSIS — C541 Malignant neoplasm of endometrium: Secondary | ICD-10-CM | POA: Diagnosis not present

## 2022-09-18 DIAGNOSIS — I890 Lymphedema, not elsewhere classified: Secondary | ICD-10-CM | POA: Diagnosis not present

## 2022-09-18 DIAGNOSIS — I87333 Chronic venous hypertension (idiopathic) with ulcer and inflammation of bilateral lower extremity: Secondary | ICD-10-CM | POA: Diagnosis not present

## 2022-09-18 NOTE — Progress Notes (Signed)
[name] ([id number]) Visit Report for 09/18/2022 Arrival Information Details Patient Name: Date of Service: [place], [name] 09/18/2022 2:15 PM Medical Record Number: [id number] Patient Account Number: [id number] Date of Birth/Sex: Treating RN: 1950-06-30 (72 y.o. [name], [name] Primary Care Yosgar Demirjian: [name], [name] Other Clinician: Referring Jarielys Girardot: Treating Lasaundra Riche/Extender: [name] [name] Weeks in Treatment: 8 Visit Information History Since Last Visit Added or deleted any medications: No Patient Arrived: Wheel Chair Any new allergies or adverse reactions: No Arrival Time: 13:59 Had a fall or experienced change in No Accompanied By: self activities of daily living that may affect Transfer Assistance: None risk of falls: Patient Identification Verified: Yes Signs or symptoms of abuse/neglect since last visito No Secondary Verification Process Completed: Yes Hospitalized since last visit: No Patient Requires Transmission-Based No Implantable device outside of the clinic excluding No Precautions: cellular tissue based products placed in the center Patient Has Alerts: Yes since last visit: Patient Alerts: Patient on Blood Thinner Has Dressing in Place as Prescribed: Yes 08/06/2022 TBI L 0.59 R0.65 Has Compression in Place as Prescribed: Yes 08/06/2022 ABI R 0.86 Pain Present Now: No L0.82 Electronic Signature(s) Signed: 09/18/2022 5:29:48 PM By: [name] RN, BSN Entered By: [name] on 09/18/2022 13:59:28 -------------------------------------------------------------------------------- Compression Therapy Details Patient Name: Date of Service: [name], [name] 09/18/2022 2:15 PM Medical Record Number: [id number] Patient Account Number: [id number] Date of Birth/Sex: Treating RN: 1950-07-10 (72 y.o. [name] Primary Care Pete Schnitzer: [name] Other Clinician: Referring Remmie Bembenek: Treating Storm Sovine/Extender:  [name] [name] Weeks in Treatment: 8 Compression Therapy Performed for Wound Assessment: Wound #1R Left,Circumferential Lower Leg Performed By: Clinician [name], RN Compression Type: Double Layer Post Procedure Diagnosis Same as Pre-procedure Electronic Signature(s) Signed: 09/18/2022 5:29:48 PM By: [name] RN, BSN Entered By: [name] on 09/18/2022 14:11:32 -------------------------------------------------------------------------------- Compression Therapy Details Patient Name: Date of Service: [name] 09/18/2022 2:15 PM Medical Record Number: [id number] Patient Account Number: [id number] Date of Birth/Sex: Treating RN: [birth year] (72 y.o. [name] Primary Care Willett Lefeber: [name] Other Clinician: Referring Drenda Sobecki: Treating Kahliyah Dick/Extender: [name] [name] Weeks in Treatment: 8 Compression Therapy Performed for Wound Assessment: Wound #2 Right,Circumferential Lower Leg Performed By: Clinician [name], RN Compression Type: Double Layer Post Procedure Diagnosis Same as Pre-procedure Electronic Signature(s) Signed: 09/18/2022 5:29:48 PM By: [name] RN, BSN Entered By: [name] on 09/18/2022 14:11:32 -------------------------------------------------------------------------------- Encounter Discharge Information Details Patient Name: Date of Service: [name], [name] 09/18/2022 2:15 PM Medical Record Number: [id number] Patient Account Number: [id number] Date of Birth/Sex: Treating RN: [birth year] (72 y.o. [name] Primary Care Nhu Glasby: [name] [name] Other Clinician: Referring Whitnee Orzel: Treating Deyana Wnuk/Extender: [name] [name] Weeks in Treatment: 8 Encounter Discharge Information Items Discharge Condition: Stable Ambulatory Status: Wheelchair Discharge Destination: Home Transportation: Private  Auto Accompanied By: self Schedule Follow-up Appointment: Yes Clinical Summary of Care: Electronic Signature(s) Signed: 09/18/2022 5:29:48 PM By: [name] RN, BSN Entered By: [name] on 09/18/2022 14:36:29 -------------------------------------------------------------------------------- Lower Extremity Assessment Details Patient Name: Date of Service: [name], [name] 09/18/2022 2:15 PM Medical Record Number: [id number] Patient Account Number: [id number] Date of Birth/Sex: Treating RN: 01-02-1950 (72 y.o. [name] Primary Care Emery Binz: [facility] [place] [name] Other Clinician: Referring Timberlyn Pickford: Treating Luc Shammas/Extender: [name] [name] Weeks in Treatment: 8 Edema Assessment Assessed: [Left: Yes] [Right: Yes] Edema: [Left: Yes] [Right: Yes] Calf Left: Right: Point of Measurement: 39 cm From Medial Instep 51 cm 51 cm Ankle Left: Right: Point of Measurement: 10 cm From Medial Instep 30 cm 30 cm Vascular Assessment Pulses: Dorsalis Pedis Palpable: [Left:Yes] [Right:Yes] Electronic Signature(s) Signed: 09/18/2022 5:29:48 PM By: [name] RN, BSN Entered By: [name] on 09/18/2022 14:06:46 -------------------------------------------------------------------------------- Multi Wound Chart Details Patient Name: Date of Service: [name], [name] 09/18/2022 2:15 PM Medical Record Number: [id number] Patient Account Number: [id number] Date of Birth/Sex: Treating RN: 07-27-1950 (72 y.o. [name] Primary Care Dahir Ayer: [name] [name] Other Clinician: Referring Veneta Sliter: Treating Temika Sutphin/Extender: [name] [name] Weeks in Treatment: 8 Vital Signs Height(in): 64 Pulse(bpm): 33 Weight(lbs): 325 Blood Pressure(mmHg): 123/57 Body Mass Index(BMI): 55.8 Temperature(F): 98.4 Respiratory Rate(breaths/min): 24 Photos: [[name]:[name]] Left, Circumferential Lower Leg Right, Circumferential  Lower Leg [name] Wound Location: Gradually Appeared Gradually Appeared [name] Wounding Event: Lymphedema Lymphedema [name] Primary Etiology: Anemia, Arrhythmia, Hypertension, Anemia, Arrhythmia, Hypertension, [name] Comorbid History: Type II Diabetes, Gout, Osteoarthritis, Type II Diabetes, Gout, Osteoarthritis, Received Radiation Received Radiation 07/05/2022 07/05/2022 [name] Date Acquired: 8 8 [name] Weeks of Treatment: Open Open [name] Wound Status: Yes No [name] Wound Recurrence: Yes Yes [name] Clustered Wound: 1 1 [name] Clustered Quantity: 12x11x0.1 3x4.5x0.1 [name] Measurements L x W x D (cm) 103.673 10.603 [name] A (cm) : rea 10.367 1.06 [name] Volume (cm) : 73.90% 97.30% [name] % Reduction in Area: 73.90% 97.30% [name] % Reduction in Volume: Full Thickness Without Exposed Full Thickness Without Exposed [name] Classification: Support Structures Support Structures Large Medium [name] Exudate Amount: Serous Serosanguineous [name] Exudate Type: amber red, brown [name] Exudate Color: Distinct, outline attached Distinct, outline attached [name] Wound Margin: Large (67-100%) Large (67-100%) [name] Granulation Amount: Red, Pink, Pale Red, Pink [name] Granulation Quality: None Present (0%) None Present (0%) [name] Necrotic Amount: Fat Layer (Subcutaneous Tissue): Yes Fat Layer (Subcutaneous Tissue): Yes [name] Exposed Structures: Fascia: No Fascia: No Tendon: No Tendon: No Muscle: No Muscle: No Joint: No Joint: No Bone: No Bone: No Small (1-33%) Medium (34-66%) [name] Epithelialization: Compression Therapy Compression Therapy [name] Procedures Performed: Treatment Notes Wound #1R (Lower Leg) Wound Laterality: Left, Circumferential Cleanser Peri-Wound Care Ketoconazole Cream 2% Discharge Instruction: Apply Ketoconazole as directed Triamcinolone 15 (g) Discharge Instruction: Use triamcinolone 15 (g) as directed Zinc Oxide Ointment 30g tube Discharge Instruction: Apply Zinc Oxide to periwound with each dressing  change Sween Lotion (Moisturizing lotion) Discharge Instruction: Apply moisturizing lotion as directed Topical Primary Dressing Maxorb Extra Calcium Alginate Dressing, 4x4 in Discharge Instruction: Apply calcium alginate to wound bed as instructed Secondary Dressing ABD Pad, 8x10 Discharge Instruction: Apply over primary dressing as directed. Secured With Compression Wrap CoFlex TLC XL 2-layer Compression System 4x7 (in/yd) Discharge Instruction: Apply CoFlex 2-layer compression as directed. (alt for 4 layer) Compression Stockings Add-Ons Wound #2 (Lower Leg) Wound Laterality: Right, Circumferential Cleanser Peri-Wound Care Ketoconazole Cream 2% Discharge Instruction: Apply Ketoconazole as directed Triamcinolone 15 (g) Discharge Instruction: Use triamcinolone 15 (g) as directed Zinc Oxide Ointment 30g tube Discharge Instruction: Apply Zinc Oxide to periwound with each dressing change Sween Lotion (Moisturizing lotion) Discharge Instruction: Apply moisturizing lotion as directed Topical Primary Dressing Maxorb Extra Calcium Alginate Dressing, 4x4 in Discharge Instruction: Apply calcium alginate to wound bed as instructed Secondary Dressing ABD Pad, 8x10 Discharge Instruction: Apply over primary dressing as directed. Secured With Compression  Wrap CoFlex TLC XL 2-layer Compression System 4x7 (in/yd) Discharge Instruction: Apply CoFlex 2-layer compression as directed. (alt for 4 layer) Compression Stockings Add-Ons Electronic Signature(s) Signed: 09/18/2022 4:16:01 PM By: [name] DO Signed: 09/18/2022 5:29:48 PM By: [name] RN, BSN Entered By: [name] on 09/18/2022 14:59:36 -------------------------------------------------------------------------------- Multi-Disciplinary Care Plan Details Patient Name: Date of Service: [name], [name] 09/18/2022 2:15 PM Medical Record Number: [id number] Patient Account Number: [id number] Date of Birth/Sex: Treating  RN: [birth year] (72 y.o. [name] Primary Care Kamel Haven: [name] Other Clinician: Referring Kentrell Guettler: Treating Aviannah Castoro/Extender: [name] [name] Weeks in Treatment: 8 Active Inactive Pain, Acute or Chronic Nursing Diagnoses: Pain, acute or chronic: actual or potential Potential alteration in comfort, pain Goals: Patient will verbalize adequate pain control and receive pain control interventions during procedures as needed Date Initiated: 07/19/2022 Target Resolution Date: 09/28/2022 Goal Status: Active Patient/caregiver will verbalize comfort level met Date Initiated: 07/19/2022 Date Inactivated: 08/06/2022 Target Resolution Date: 08/06/2022 Goal Status: Met Interventions: Encourage patient to take pain medications as prescribed Provide education on pain management Reposition patient for comfort Treatment Activities: Administer pain control measures as ordered : 07/19/2022 Notes: Venous Leg Ulcer Nursing Diagnoses: Actual venous Insuffiency (use after diagnosis is confirmed) Potential for venous Insuffiency (use before diagnosis confirmed) Goals: Non-invasive venous studies are completed as ordered Date Initiated: 07/19/2022 Target Resolution Date: 09/28/2022 Goal Status: Active Interventions: Assess peripheral edema status every visit. Compression as ordered Provide education on venous insufficiency Treatment Activities: Non-invasive vascular studies : 07/19/2022 Notes: Wound/Skin Impairment Nursing Diagnoses: Knowledge deficit related to ulceration/compromised skin integrity Goals: Patient/caregiver will verbalize understanding of skin care regimen Date Initiated: 07/19/2022 Target Resolution Date: 09/28/2022 Goal Status: Active Interventions: Assess patient/caregiver ability to perform ulcer/skin care regimen upon admission and as needed Assess ulceration(s) every visit Provide education on ulcer and skin  care Treatment Activities: Skin care regimen initiated : 07/19/2022 Topical wound management initiated : 07/19/2022 Notes: Electronic Signature(s) Signed: 09/18/2022 5:29:48 PM By: [name] RN, BSN Entered By: [name] on 09/18/2022 14:09:06 -------------------------------------------------------------------------------- Pain Assessment Details Patient Name: Date of Service: [name], Wright RIA [name] 09/18/2022 2:15 PM Medical Record Number: [id number] Patient Account Number: [id number] Date of Birth/Sex: Treating RN: 1950/10/07 (72 y.o. [name] Primary Care Sadie Hazelett: [name] Other Clinician: Referring Sadie Hazelett: Treating Yee Joss/Extender: [name] [name] Weeks in Treatment: 8 Active Problems Location of Pain Severity and Description of Pain Patient Has Paino No Site Locations Rate the pain. Current Pain Level: 0 Pain Management and Medication Current Pain Management: Medication: No Cold Application: No Rest: No Massage: No Activity: No T.E.[name].S.: No Heat Application: No Leg drop or elevation: No Is the Current Pain Management Adequate: Adequate How does your wound impact your activities of daily livingo Sleep: No Bathing: No Appetite: No Relationship With Others: No Bladder Continence: No Emotions: No Bowel Continence: No Work: No Toileting: No Drive: No Dressing: No Hobbies: No Engineer, maintenance) Signed: 09/18/2022 5:29:48 PM By: [name] RN, BSN Entered By: [name] on 09/18/2022 13:59:49 -------------------------------------------------------------------------------- Patient/Caregiver Education Details Patient Name: Date of Service: [name] 9/19/2023andnbsp2:15 PM Medical Record Number: [id number] Patient Account Number: [id number] Date of Birth/Gender: Treating RN: 1950/10/09 (72 y.o. [name] Primary Care Physician: [name] [name] Other Clinician: Referring  Physician: Treating Physician/Extender: [name] [name] Weeks in Treatment: 8 Education Assessment Education  Provided To: Patient Education Topics Provided Pain: Handouts: A Guide to Pain Control Methods: Explain/Verbal Responses: Reinforcements needed Electronic Signature(s) Signed: 09/18/2022 5:29:48 PM By: [name] RN, BSN Entered By: [name] on 09/18/2022 14:09:20 -------------------------------------------------------------------------------- Wound Assessment Details Patient Name: Date of Service: [name], [name] 09/18/2022 2:15 PM Medical Record Number: [id number] Patient Account Number: [id number] Date of Birth/Sex: Treating RN: 25-Jul-1950 (72 y.o. [name], [name] Primary Care Arby Dahir: [name] Other Clinician: Referring Tyleigh Mahn: Treating Saanvi Hakala/Extender: [name] [name] Weeks in Treatment: 8 Wound Status Wound Number: 1R Primary Lymphedema Etiology: Wound Location: Left, Circumferential Lower Leg Wound Open Wounding Event: Gradually Appeared Status: Date Acquired: 07/05/2022 Date Acquired: 07/05/2022 Comorbid Anemia, Arrhythmia, Hypertension, Type II Diabetes, Gout, Weeks Of Treatment: 8 History: Osteoarthritis, Received Radiation Clustered Wound: Yes Photos Wound Measurements Length: (cm) 12 Width: (cm) 11 Depth: (cm) 0.1 Clustered Quantity: 1 Area: (cm) 103.673 Volume: (cm) 10.367 % Reduction in Area: 73.9% % Reduction in Volume: 73.9% Epithelialization: Small (1-33%) Tunneling: No Undermining: No Wound Description Classification: Full Thickness Without Exposed Support Structures Wound Margin: Distinct, outline attached Exudate Amount: Large Exudate Type: Serous Exudate Color: amber Foul Odor After Cleansing: No Slough/Fibrino No Wound Bed Granulation Amount: Large (67-100%) Exposed Structure Granulation Quality: Red, Pink, Pale Fascia Exposed: No Necrotic  Amount: None Present (0%) Fat Layer (Subcutaneous Tissue) Exposed: Yes Tendon Exposed: No Muscle Exposed: No Joint Exposed: No Bone Exposed: No Treatment Notes Wound #1R (Lower Leg) Wound Laterality: Left, Circumferential Cleanser Peri-Wound Care Ketoconazole Cream 2% Discharge Instruction: Apply Ketoconazole as directed Triamcinolone 15 (g) Discharge Instruction: Use triamcinolone 15 (g) as directed Zinc Oxide Ointment 30g tube Discharge Instruction: Apply Zinc Oxide to periwound with each dressing change Sween Lotion (Moisturizing lotion) Discharge Instruction: Apply moisturizing lotion as directed Topical Primary Dressing Maxorb Extra Calcium Alginate Dressing, 4x4 in Discharge Instruction: Apply calcium alginate to wound bed as instructed Secondary Dressing ABD Pad, 8x10 Discharge Instruction: Apply over primary dressing as directed. Secured With Compression Wrap CoFlex TLC XL 2-layer Compression System 4x7 (in/yd) Discharge Instruction: Apply CoFlex 2-layer compression as directed. (alt for 4 layer) Compression Stockings Add-Ons Electronic Signature(s) Signed: 09/18/2022 5:29:48 PM By: [name] RN, BSN Entered By: [name] on 09/18/2022 14:08:18 -------------------------------------------------------------------------------- Wound Assessment Details Patient Name: Date of Service: [name], [name] 09/18/2022 2:15 PM Medical Record Number: [id number] Patient Account Number: [id number] Date of Birth/Sex: Treating RN: 1950/03/21 (72 y.o. [name], [name] Primary Care Danta Baumgardner: [name] [name] Other Clinician: Referring Zaid Tomes: Treating Beckett Hickmon/Extender: [name] [name] Weeks in Treatment: 8 Wound Status Wound Number: 2 Primary Lymphedema Etiology: Wound Location: Right, Circumferential Lower Leg Wound Open Wounding Event: Gradually Appeared Status: Date Acquired: 07/05/2022 Comorbid Anemia, Arrhythmia, Hypertension,  Type II Diabetes, Gout, Weeks Of Treatment: 8 History: Osteoarthritis, Received Radiation Clustered Wound: Yes Photos Wound Measurements Length: (cm) 3 Width: (cm) 4.5 Depth: (cm) 0.1 Clustered Quantity: 1 Area: (cm) 10.603 Volume: (cm) 1.06 % Reduction in Area: 97.3% % Reduction in Volume: 97.3% Epithelialization: Medium (34-66%) Tunneling: No Undermining: No Wound Description Classification: Full Thickness Without Exposed Support Structures Wound Margin: Distinct, outline attached Exudate Amount: Medium Exudate Type: Serosanguineous Exudate Color: red, brown Foul Odor After Cleansing: No Slough/Fibrino No Wound Bed Granulation Amount: Large (67-100%) Exposed Structure Granulation Quality: Red, Pink Fascia Exposed: No Necrotic Amount: None Present (0%) Fat Layer (Subcutaneous Tissue) Exposed: Yes Tendon Exposed: No Muscle Exposed: No Joint Exposed: No  Bone Exposed: No Treatment Notes Wound #2 (Lower Leg) Wound Laterality: Right, Circumferential Cleanser Peri-Wound Care Ketoconazole Cream 2% Discharge Instruction: Apply Ketoconazole as directed Triamcinolone 15 (g) Discharge Instruction: Use triamcinolone 15 (g) as directed Zinc Oxide Ointment 30g tube Discharge Instruction: Apply Zinc Oxide to periwound with each dressing change Sween Lotion (Moisturizing lotion) Discharge Instruction: Apply moisturizing lotion as directed Topical Primary Dressing Maxorb Extra Calcium Alginate Dressing, 4x4 in Discharge Instruction: Apply calcium alginate to wound bed as instructed Secondary Dressing ABD Pad, 8x10 Discharge Instruction: Apply over primary dressing as directed. Secured With Compression Wrap CoFlex TLC XL 2-layer Compression System 4x7 (in/yd) Discharge Instruction: Apply CoFlex 2-layer compression as directed. (alt for 4 layer) Compression Stockings Add-Ons Electronic Signature(s) Signed: 09/18/2022 5:29:48 PM By: [name] RN, BSN Entered By:  [name] on 09/18/2022 14:08:56 -------------------------------------------------------------------------------- Vitals Details Patient Name: Date of Service: [name], [name] 09/18/2022 2:15 PM Medical Record Number: [id number] Patient Account Number: [id number] Date of Birth/Sex: Treating RN: 1950/02/28 (72 y.o. [name], [name] Primary Care Rose-Marie Hickling: [name] Other Clinician: Referring Osiris Charles: Treating Milyn Stapleton/Extender: [name] [name] Weeks in Treatment: 8 Vital Signs Time Taken: 13:59 Temperature (F): 98.4 Height (in): 64 Pulse (bpm): 78 Weight (lbs): 325 Respiratory Rate (breaths/min): 24 Body Mass Index (BMI): 55.8 Blood Pressure (mmHg): 123/57 Reference Range: 80 - 120 mg / dl Airway Pulse Oximetry (%): 93 Electronic Signature(s) Signed: 09/18/2022 5:29:48 PM By: [name] RN, BSN Entered By: [name] on 09/18/2022 14:00:14

## 2022-09-18 NOTE — Progress Notes (Signed)
[name], [name] ([id number]) Visit Report for 09/18/2022 Chief Complaint Document Details Patient Name: Date of Service: [name], [name] 09/18/2022 2:15 PM Medical Record Number: [id number] Patient Account Number: [id number] Date of Birth/Sex: Treating RN: [birth year] (72 y.o. [name] Primary Care Provider: MEDINA-V A [name], [name] Other Clinician: Referring Provider: Treating Provider/Extender: [name] [name] A RGA S, [name] Weeks in Treatment: 8 Information Obtained from: Patient Chief Complaint 07/19/2022; bilateral lower extremity wounds Electronic Signature(s) Signed: 09/18/2022 4:16:01 PM By: [name] Shan DO Entered By: [name] on 09/18/2022 14:59:44 -------------------------------------------------------------------------------- HPI Details Patient Name: Date of Service: [name] [place], [place] RIA N 09/18/2022 2:15 PM Medical Record Number: [id number] Patient Account Number: [id number] Date of Birth/Sex: Treating RN: 05/09/1950 (72 y.o. [name] Primary Care Provider: [name], [name] Other Clinician: Referring Provider: Treating Provider/Extender: [name] [name] A RGA S, [name] Weeks in Treatment: 8 History of Present Illness HPI Description: Admission 07/19/2022 [name] Is a 72 year old female with a past medical history of endometrial cancer, lymphedema, insulin-dependent type 2 diabetes that presents to the clinic for a 4-week history of weeping to her lower extremities bilaterally. She visited the ED for this issue on 07/07/2022 and she was prescribed doxycycline For bilateral lower extremity cellulitis. She has completed this course. She currently denies signs of infection. She does not have compression stockings. She states she recently gained weight over the past couple months. She has never had wounds before. 7/28; patient presents for follow-up. We have been using silver alginate under Kerlix/Coban. She is scheduled to get  her ABIs with TBI's on 8/7. She states that the wraps slid down to her mid shin. She denies signs of infection. 8/7; patient presents for follow-up. We have been using silver alginate under Kerlix/Coban. She had arterial studies done that showed an ABI of 0.86 on the right and 0.82 on the left with multiphasic waveforms throughout the lower extremities. 8/14; patient presents for follow-up. We have been using silver alginate under 3 layer compression. She tolerated the wrap well. She has no issues or complaints today. 8/21; patient presents for follow-up. We have been using silver alginate under 3 layer compression. She has no issues or complaints today. She has her juxta lite compressions with her. 8/28; patient presents for follow-up. We have been using silver alginate under 3 layer compression. She has no issues or complaints today. 9/5; patient presents for follow-up. We have been using silver alginate under 4 layer compression to both extremities bilaterally. She has her juxta lite compression with her today. 9/12; patient presents for follow-up. We have been using silver alginate under 4-layer compression to the right leg. She was supposed to wear her juxta lite compression to the left lower extremity. She states she could not get it on so she has not been wearing it. She also has not been taking her Lasix. 9/19; patient presents for follow-up. We have been using silver alginate under 4-layer compression to her lower extremities bilaterally. She has restarted taking her Lasix on a daily basis. Electronic Signature(s) Signed: 09/18/2022 4:16:01 PM By: [name] Shan DO Entered By: [name] on 09/18/2022 15:00:25 -------------------------------------------------------------------------------- Physical Exam Details Patient Name: Date of Service: [name], [name] RIA N 09/18/2022 2:15 PM Medical Record Number: [id number] Patient Account Number: [id number] Date of Birth/Sex: Treating  RN: Apr 21, 1950 (72 y.o. [name] Primary Care Provider: [name], [name] Other Clinician: Referring Provider: Treating Provider/Extender: [name] [name] A RGA S,  [name] Weeks in Treatment: 8 Constitutional respirations regular, non-labored and within target range for patient.. Cardiovascular 2+ dorsalis pedis/posterior tibialis pulses. Psychiatric pleasant and cooperative. Notes Right lower extremity: T the anterior aspect there is an open wound with granulation tissue present. o Left lower extremity: Scattered open areas with mild weeping. 3+ pitting edema to the knees bilaterally. No signs of surrounding soft tissue infection Electronic Signature(s) Signed: 09/18/2022 4:16:01 PM By: [name] Shan DO Entered By: [name] on 09/18/2022 15:02:39 -------------------------------------------------------------------------------- Physician Orders Details Patient Name: Date of Service: [name] [place], [place] RIA N 09/18/2022 2:15 PM Medical Record Number: [id number] Patient Account Number: [id number] Date of Birth/Sex: Treating RN: 1950/04/10 (72 y.o. [name] Primary Care Provider: [name], [name] Other Clinician: Referring Provider: Treating Provider/Extender: [name] [name] A RGA S, [name] Weeks in Treatment: 8 Verbal / Phone Orders: No Diagnosis Coding ICD-10 Coding Code Description [phone] 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d L97.822 Non-pressure chronic ulcer of other part of left lower leg with fat layer exposed I89.0 Lymphedema, not elsewhere classified I87.333 Chronic venous hypertension (idiopathic) with ulcer and inflammation of bilateral lower extremity E11.622 Type 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 Follow-up Appointments ppointment in 1 week. - Dr. Heber  and [place], Room 8 Tuesday Return A ppointment in 2 weeks. - Dr.  Heber  and [place], Room 8 Tuesday Return A Other: - take the lasix daily as prescribed. Someone will call you from [place] to schedule venous studies tests. Bathing/ Shower/ Hygiene May shower with protection but do not get wound dressing(s) wet. Edema Control - Lymphedema / SCD / Other Lymphedema Pumps. Use Lymphedema pumps on leg(s) 2-3 times a day for 45-60 minutes. If wearing any wraps or hose, do not remove them. Continue exercising as instructed. - 2-3 times a day throughout the day. Elevate legs to the level of the heart or above for 30 minutes daily and/or when sitting, a frequency of: - 3-4 times a day throughout the day. Avoid standing for long periods of time. Exercise regularly Wound Treatment Wound #1R - Lower Leg Wound Laterality: Left, Circumferential Peri-Wound Care: Ketoconazole Cream 2% 1 x Per Week/30 Days Discharge Instructions: Apply Ketoconazole as directed Peri-Wound Care: Triamcinolone 15 (g) 1 x Per Week/30 Days Discharge Instructions: Use triamcinolone 15 (g) as directed Peri-Wound Care: Zinc Oxide Ointment 30g tube 1 x Per Week/30 Days Discharge Instructions: Apply Zinc Oxide to periwound with each dressing change Peri-Wound Care: Sween Lotion (Moisturizing lotion) 1 x Per Week/30 Days Discharge Instructions: Apply moisturizing lotion as directed Prim Dressing: Maxorb Extra Calcium Alginate Dressing, 4x4 in 1 x Per Week/30 Days ary Discharge Instructions: Apply calcium alginate to wound bed as instructed Secondary Dressing: ABD Pad, 8x10 1 x Per Week/30 Days Discharge Instructions: Apply over primary dressing as directed. Compression Wrap: CoFlex TLC XL 2-layer Compression System 4x7 (in/yd) 1 x Per Week/30 Days Discharge Instructions: Apply CoFlex 2-layer compression as directed. (alt for 4 layer) Wound #2 - Lower Leg Wound Laterality: Right, Circumferential Peri-Wound Care: Ketoconazole Cream 2% 1 x Per Week/30 Days Discharge Instructions: Apply  Ketoconazole as directed Peri-Wound Care: Triamcinolone 15 (g) 1 x Per Week/30 Days Discharge Instructions: Use triamcinolone 15 (g) as directed Peri-Wound Care: Zinc Oxide Ointment 30g tube 1 x Per Week/30 Days Discharge Instructions: Apply Zinc Oxide to periwound with each dressing change Peri-Wound Care: Sween Lotion (Moisturizing lotion) 1 x Per Week/30 Days Discharge  Instructions: Apply moisturizing lotion as directed Prim Dressing: Maxorb Extra Calcium Alginate Dressing, 4x4 in 1 x Per Week/30 Days ary Discharge Instructions: Apply calcium alginate to wound bed as instructed Secondary Dressing: ABD Pad, 8x10 1 x Per Week/30 Days Discharge Instructions: Apply over primary dressing as directed. Compression Wrap: CoFlex TLC XL 2-layer Compression System 4x7 (in/yd) 1 x Per Week/30 Days Discharge Instructions: Apply CoFlex 2-layer compression as directed. (alt for 4 layer) Services and Therapies Venous Studies -Bilateral - [facility] for venous reflux studies to bilateral lower legs with venous consult related to lymphedema, lower legs ulcers, and venous insufficiency. - (ICD10 [id number] - Chronic venous hypertension (idiopathic) with ulcer and inflammation of bilateral lower extremity) Electronic Signature(s) Signed: 09/18/2022 4:16:01 PM By: [name] Shan DO Entered By: [name] on 09/18/2022 15:02:48 Prescription 09/18/2022 -------------------------------------------------------------------------------- Thayer Dallas DO Patient Name: Provider: 1950/06/19 [id number] Date of Birth: NPI#Wanda Plump [id number] Sex: DEA #: [phone] Phone #: License #: [place] Patient Address: [address] [place], [place] [address] [place], [place] [address] [phone] Allergies Actos; metformin; lisinopril Provider's Orders Venous Studies -Bilateral - ICD10: I87.333 - [facility] for venous reflux studies to bilateral lower legs with venous consult related to lymphedema, lower legs ulcers, and venous insufficiency. Hand Signature: Date(s): Electronic Signature(s) Signed: 09/18/2022 4:16:01 PM By: [name] Shan DO Entered By: [name] on 09/18/2022 15:02:48 -------------------------------------------------------------------------------- Problem List Details Patient Name: Date of Service: [name], [name] RIA N 09/18/2022 2:15 PM Medical Record Number: [id number] Patient Account Number: [id number] Date of Birth/Sex: Treating RN: Jul 18, 1950 (72 y.o. [name], [name] Primary Care Provider: [name], [name] Other Clinician: Referring Provider: Treating Provider/Extender: [name] [name] A RGA S, [name] Weeks in Treatment: 8 Active Problems ICD-10 Encounter Code Description Active Date MDM Diagnosis L97.812 Non-pressure chronic ulcer of other part of right lower leg with fat layer 07/19/2022 No Yes exposed L97.822 Non-pressure chronic ulcer of other part of left lower leg with fat layer exposed7/20/2023 No Yes I89.0 Lymphedema, not elsewhere classified 07/19/2022 No Yes I87.333 Chronic venous hypertension (idiopathic) with ulcer and inflammation of 07/19/2022 No Yes bilateral lower extremity E11.622 Type 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 07/19/2022 No Yes 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 07/19/2022 No Yes Inactive Problems Resolved Problems Electronic Signature(s) Signed: 09/18/2022 4:16:01 PM By: [name] Shan DO Entered By: [name] on 09/18/2022 14:59:31 -------------------------------------------------------------------------------- Progress Note Details Patient Name: Date of Service: [name] [place], [place] RIA N 09/18/2022 2:15 PM Medical Record Number: [id number] Patient Account Number: [id number] Date of Birth/Sex: Treating RN: 01-25-1950 (72 y.o. [name] Primary Care Provider: [name], [name]  Other Clinician: Referring Provider: Treating Provider/Extender: [name] [name] A RGA S, [name] Weeks in Treatment: 8 Subjective Chief Complaint Information obtained from Patient 07/19/2022; bilateral lower extremity wounds History of Present Illness (HPI) Admission 07/19/2022 Ms. Elaya Droege Is a 72 year old female with a past medical history of endometrial cancer, lymphedema, insulin-dependent type 2 diabetes that presents to the clinic for a 4-week history of weeping to her lower extremities bilaterally. She visited the ED for this issue on 07/07/2022 and she was prescribed doxycycline For bilateral lower extremity cellulitis. She has completed this course. She currently denies signs of infection. She does not have compression stockings. She states she recently gained weight over the past couple months. She has never had wounds before. 7/28; patient presents for follow-up. We have been  using silver alginate under Kerlix/Coban. She is scheduled to get her ABIs with TBI's on 8/7. She states that the wraps slid down to her mid shin. She denies signs of infection. 8/7; patient presents for follow-up. We have been using silver alginate under Kerlix/Coban. She had arterial studies done that showed an ABI of 0.86 on the right and 0.82 on the left with multiphasic waveforms throughout the lower extremities. 8/14; patient presents for follow-up. We have been using silver alginate under 3 layer compression. She tolerated the wrap well. She has no issues or complaints today. 8/21; patient presents for follow-up. We have been using silver alginate under 3 layer compression. She has no issues or complaints today. She has her juxta lite compressions with her. 8/28; patient presents for follow-up. We have been using silver alginate under 3 layer compression. She has no issues or complaints today. 9/5; patient presents for follow-up. We have been using silver alginate under 4 layer compression to  both extremities bilaterally. She has her juxta lite compression with her today. 9/12; patient presents for follow-up. We have been using silver alginate under 4-layer compression to the right leg. She was supposed to wear her juxta lite compression to the left lower extremity. She states she could not get it on so she has not been wearing it. She also has not been taking her Lasix. 9/19; patient presents for follow-up. We have been using silver alginate under 4-layer compression to her lower extremities bilaterally. She has restarted taking her Lasix on a daily basis. Patient History Information obtained from Patient, Chart. Family History Cancer - Father, Diabetes - Siblings. Social History Former smoker - quit 1988, Marital Status - Married, Alcohol Use - Moderate - cocktails x2 a week, Drug Use - No History, Caffeine Use - Never. Medical History Hematologic/Lymphatic Patient has history of Anemia - iron Cardiovascular Patient has history of Arrhythmia - A. Fib 09/10/2022 dx started eliquis, Hypertension Endocrine Patient has history of Type II Diabetes Musculoskeletal Patient has history of Gout, Osteoarthritis Oncologic Patient has history of Received Radiation - 2023 Hospitalization/Surgery History - one ovary removed 2020. - pilonidal cyst extraction 1969. - diverculitis temp colostomy. Medical A Surgical History Notes nd Cardiovascular hyperlipidemia Gastrointestinal GERD Diverticulitis Musculoskeletal carpel tunnel bilateral-surgery to right hand Oncologic endometrial cancer skin Ca Objective Constitutional respirations regular, non-labored and within target range for patient.. Vitals Time Taken: 1:59 PM, Height: 64 in, Weight: 325 lbs, BMI: 55.8, Temperature: 98.4 F, Pulse: 78 bpm, Respiratory Rate: 24 breaths/min, Blood Pressure: 123/57 mmHg, Pulse Oximetry: 93 %. Cardiovascular 2+ dorsalis pedis/posterior tibialis pulses. Psychiatric pleasant and  cooperative. General Notes: Right lower extremity: T the anterior aspect there is an open wound with granulation tissue present. Left lower extremity: Scattered open areas o with mild weeping. 3+ pitting edema to the knees bilaterally. No signs of surrounding soft tissue infection Integumentary (Hair, Skin) Wound #1R status is Open. Original cause of wound was Gradually Appeared. The date acquired was: 07/05/2022. The wound has been in treatment 8 weeks. The wound is located on the Left,Circumferential Lower Leg. The wound measures 12cm length x 11cm width x 0.1cm depth; 103.673cm^2 area and 10.367cm^3 volume. There is Fat Layer (Subcutaneous Tissue) exposed. There is no tunneling or undermining noted. There is a large amount of serous drainage noted. The wound margin is distinct with the outline attached to the wound base. There is large (67-100%) red, pink, pale granulation within the wound bed. There is no necrotic tissue within the wound bed. Wound #  2 status is Open. Original cause of wound was Gradually Appeared. The date acquired was: 07/05/2022. The wound has been in treatment 8 weeks. The wound is located on the Right,Circumferential Lower Leg. The wound measures 3cm length x 4.5cm width x 0.1cm depth; 10.603cm^2 area and 1.06cm^3 volume. There is Fat Layer (Subcutaneous Tissue) exposed. There is no tunneling or undermining noted. There is a medium amount of serosanguineous drainage noted. The wound margin is distinct with the outline attached to the wound base. There is large (67-100%) red, pink granulation within the wound bed. There is no necrotic tissue within the wound bed. Assessment Active Problems ICD-10 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d Non-pressure chronic ulcer of other part of left lower leg with fat layer exposed Lymphedema, not elsewhere classified Chronic venous hypertension (idiopathic) with ulcer and inflammation of bilateral  lower extremity Type 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 Malignant neoplasm of endometrium Patient's wounds are stable. Her edema has improved slightly with the use of Lasix. I recommended continuing this as prescribed. I will continue with silver alginate under 4-layer compression. At this time I recommended venous reflux studies and we will refer to [facility] imaging. Procedures Wound #1R Pre-procedure diagnosis of Wound #1R is a Lymphedema located on the Left,Circumferential Lower Leg . There was a Double Layer Compression Therapy Procedure by [name], RN. Post procedure Diagnosis Wound #1R: Same as Pre-Procedure Wound #2 Pre-procedure diagnosis of Wound #2 is a Lymphedema located on the Right,Circumferential Lower Leg . There was a Double Layer Compression Therapy Procedure by [name], RN. Post procedure Diagnosis Wound #2: Same as Pre-Procedure Plan Follow-up Appointments: Return Appointment in 1 week. - Dr. Heber [place] and [place], Room 8 Tuesday Return Appointment in 2 weeks. - Dr. Heber  and [place], Room 8 Tuesday Other: - take the lasix daily as prescribed. Someone will call you from [place] to schedule venous studies tests. Bathing/ Shower/ Hygiene: May shower with protection but do not get wound dressing(s) wet. Edema Control - Lymphedema / SCD / Other: Lymphedema Pumps. Use Lymphedema pumps on leg(s) 2-3 times a day for 45-60 minutes. If wearing any wraps or hose, do not remove them. Continue exercising as instructed. - 2-3 times a day throughout the day. Elevate legs to the level of the heart or above for 30 minutes daily and/or when sitting, a frequency of: - 3-4 times a day throughout the day. Avoid standing for long periods of time. Exercise regularly Services and Therapies ordered were: Venous Studies -Bilateral - [facility] for venous reflux studies to bilateral lower legs with venous consult related to lymphedema, lower legs ulcers, and  venous insufficiency. WOUND #1R: - Lower Leg Wound Laterality: Left, Circumferential Peri-Wound Care: Ketoconazole Cream 2% 1 x Per Week/30 Days Discharge Instructions: Apply Ketoconazole as directed Peri-Wound Care: Triamcinolone 15 (g) 1 x Per Week/30 Days Discharge Instructions: Use triamcinolone 15 (g) as directed Peri-Wound Care: Zinc Oxide Ointment 30g tube 1 x Per Week/30 Days Discharge Instructions: Apply Zinc Oxide to periwound with each dressing change Peri-Wound Care: Sween Lotion (Moisturizing lotion) 1 x Per Week/30 Days Discharge Instructions: Apply moisturizing lotion as directed Prim Dressing: Maxorb Extra Calcium Alginate Dressing, 4x4 in 1 x Per Week/30 Days ary Discharge Instructions: Apply calcium alginate to wound bed as instructed Secondary Dressing: ABD Pad, 8x10 1 x Per Week/30 Days Discharge Instructions: Apply over primary dressing as directed. Com pression Wrap: CoFlex TLC XL 2-layer Compression System 4x7 (in/yd) 1  x Per Week/30 Days Discharge Instructions: Apply CoFlex 2-layer compression as directed. (alt for 4 layer) WOUND #2: - Lower Leg Wound Laterality: Right, Circumferential Peri-Wound Care: Ketoconazole Cream 2% 1 x Per Week/30 Days Discharge Instructions: Apply Ketoconazole as directed Peri-Wound Care: Triamcinolone 15 (g) 1 x Per Week/30 Days Discharge Instructions: Use triamcinolone 15 (g) as directed Peri-Wound Care: Zinc Oxide Ointment 30g tube 1 x Per Week/30 Days Discharge Instructions: Apply Zinc Oxide to periwound with each dressing change Peri-Wound Care: Sween Lotion (Moisturizing lotion) 1 x Per Week/30 Days Discharge Instructions: Apply moisturizing lotion as directed Prim Dressing: Maxorb Extra Calcium Alginate Dressing, 4x4 in 1 x Per Week/30 Days ary Discharge Instructions: Apply calcium alginate to wound bed as instructed Secondary Dressing: ABD Pad, 8x10 1 x Per Week/30 Days Discharge Instructions: Apply over primary dressing as  directed. Com pression Wrap: CoFlex TLC XL 2-layer Compression System 4x7 (in/yd) 1 x Per Week/30 Days Discharge Instructions: Apply CoFlex 2-layer compression as directed. (alt for 4 layer) 1. Silver alginate under 4-layer compression bilaterally to the lower extremities 2. Referral to [facility] imagingoovenous reflux studies 3. Follow-up in 1 week Electronic Signature(s) Signed: 09/18/2022 4:16:01 PM By: [name] Shan DO Entered By: [name] on 09/18/2022 15:05:37 -------------------------------------------------------------------------------- HxROS Details Patient Name: Date of Service: [name] Ley[place], [place] RIA N 09/18/2022 2:15 PM Medical Record Number: [id number] Patient Account Number: [id number] Date of Birth/Sex: Treating RN: [birth year] (72 y.o. [name] Primary Care Provider: [name], [name] Other Clinician: Referring Provider: Treating Provider/Extender: [name] [name] A RGA S, [name] Weeks in Treatment: 8 Information Obtained From Patient Chart Hematologic/Lymphatic Medical History: Positive for: Anemia - iron Cardiovascular Medical History: Positive for: Arrhythmia - A. Fib 09/10/2022 dx started eliquis; Hypertension Past Medical History Notes: hyperlipidemia Gastrointestinal Medical History: Past Medical History Notes: GERD Diverticulitis Endocrine Medical History: Positive for: Type II Diabetes Time with diabetes: 72 years old Treated with: Insulin, Diet Blood sugar tested every day: Yes Tested : three times a week. Musculoskeletal Medical History: Positive for: Gout; Osteoarthritis Past Medical History Notes: carpel tunnel bilateral-surgery to right hand Oncologic Medical History: Positive for: Received Radiation - 2023 Past Medical History Notes: endometrial cancer skin Ca Immunizations Pneumococcal Vaccine: Received Pneumococcal Vaccination: Yes Received Pneumococcal Vaccination On or After 60th Birthday:  No Implantable Devices No devices added Hospitalization / Surgery History Type of Hospitalization/Surgery one ovary removed 2020 pilonidal cyst extraction 1969 diverculitis temp colostomy Family and Social History Cancer: Yes - Father; Diabetes: Yes - Siblings; Former smoker - quit 1988; Marital Status - Married; Alcohol Use: Moderate - cocktails x2 a week; Drug Use: No History; Caffeine Use: Never; Financial Concerns: No; Food, Clothing or Shelter Needs: No; Support System Lacking: No; Transportation Concerns: No Electronic Signature(s) Signed: 09/18/2022 4:16:01 PM By: [name] Shan DO Signed: 09/18/2022 5:29:48 PM By: [name] RN, BSN Entered By: [name] on 09/18/2022 15:00:31 -------------------------------------------------------------------------------- SuperBill Details Patient Name: Date of Service: [name], [place] 09/18/2022 Medical Record Number: [id number] Patient Account Number: [id number] Date of Birth/Sex: Treating RN: 1950-09-05 (72 y.o. [name], [name] Primary Care Provider: [name], [name] Other Clinician: Referring Provider: Treating Provider/Extender: [name] [name] A RGA S, [name] Weeks in Treatment: 8 Diagnosis Coding ICD-10 Codes Code Description [phone] 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d L97.822 Non-pressure chronic ulcer of other part of left lower leg with fat layer exposed I89.0 Lymphedema, not elsewhere classified I87.333 Chronic venous hypertension (  idiopathic) with ulcer and inflammation of bilateral lower extremity E11.622 Type 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 Modifier: Quantity: 1 Physician Procedures : CPT4 Code Description Modifier [id number]  [id number] - WC PHYS LEVEL 3 - EST PT ICD-10 Diagnosis Description F68.127 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d L97.822 Non-pressure chronic ulcer of other part of left  lower leg with fat layer exposed I89.0 Lymphedema, not elsewhere classified I87.333 Chronic venous hypertension (idiopathic) with ulcer and inflammation of bilateral lower extremity Quantity: 1 Electronic Signature(s) Signed: 09/18/2022 4:16:01 PM By: [name] Shan DO Entered By: [name] on 09/18/2022 15:05:56

## 2022-09-24 ENCOUNTER — Other Ambulatory Visit: Payer: Self-pay | Admitting: Internal Medicine

## 2022-09-24 DIAGNOSIS — I872 Venous insufficiency (chronic) (peripheral): Secondary | ICD-10-CM

## 2022-09-25 ENCOUNTER — Encounter (HOSPITAL_BASED_OUTPATIENT_CLINIC_OR_DEPARTMENT_OTHER): Payer: Medicare Other | Admitting: Internal Medicine

## 2022-09-25 DIAGNOSIS — I87333 Chronic venous hypertension (idiopathic) with ulcer and inflammation of bilateral lower extremity: Secondary | ICD-10-CM | POA: Diagnosis not present

## 2022-09-25 DIAGNOSIS — L97812 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d: Secondary | ICD-10-CM

## 2022-09-25 DIAGNOSIS — I890 Lymphedema, not elsewhere classified: Secondary | ICD-10-CM | POA: Diagnosis not present

## 2022-09-25 DIAGNOSIS — I739 Peripheral vascular disease, unspecified: Secondary | ICD-10-CM | POA: Diagnosis not present

## 2022-09-25 DIAGNOSIS — E11622 Type 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: Secondary | ICD-10-CM | POA: Diagnosis not present

## 2022-09-25 DIAGNOSIS — C541 Malignant neoplasm of endometrium: Secondary | ICD-10-CM | POA: Diagnosis not present

## 2022-09-25 DIAGNOSIS — L97822 Non-pressure chronic ulcer of other part of left lower leg with fat layer exposed: Secondary | ICD-10-CM | POA: Diagnosis not present

## 2022-09-25 NOTE — Progress Notes (Signed)
[name], [name] ([id number]) Visit Report for 09/25/2022 Arrival Information Details Patient Name: Date of Service: [place], [name] 09/25/2022 2:15 PM Medical Record Number: [id number] Patient Account Number: [id number] Date of Birth/Sex: Treating RN: 1950/07/24 (72 y.o. [name], [name] Primary Care [name]: [name], [name] Other Clinician: Referring [name]: Treating [name]/Extender: [name] [name], [name] Weeks in Treatment: 9 Visit Information History Since Last Visit Added or deleted any medications: No Patient Arrived: Wheel Chair Any new allergies or adverse reactions: No Arrival Time: 14:31 Had a fall or experienced change in No Accompanied By: self activities of daily living that may affect Transfer Assistance: None risk of falls: Patient Identification Verified: Yes Signs or symptoms of abuse/neglect since last visito No Secondary Verification Process Completed: Yes Hospitalized since last visit: No Patient Requires Transmission-Based No Implantable device outside of the clinic excluding No Precautions: cellular tissue based products placed in the center Patient Has Alerts: Yes since last visit: Patient Alerts: Patient on Blood Thinner Has Compression in Place as Prescribed: Yes 08/06/2022 TBI L 0.59 R0.65 Pain Present Now: Yes 08/06/2022 ABI R 0.86 L0.82 Electronic Signature(s) Signed: 09/25/2022 4:43:35 PM By: [name] Entered By: [name] on 09/25/2022 14:31:52 -------------------------------------------------------------------------------- Compression Therapy Details Patient Name: Date of Service: [name] 09/25/2022 2:15 PM Medical Record Number: [id number] Patient Account Number: [id number] Date of Birth/Sex: Treating RN: [birth year] (72 y.o. [name] Primary Care Taro Hidrogo: [name], [name] Other Clinician: Referring Chanay Nugent: Treating [name]/Extender: [name] [name], [place]  [place] Weeks in Treatment: 9 Compression Therapy Performed for Wound Assessment: Wound #2 Right,Circumferential Lower Leg Performed By: Clinician [name], RN Compression Type: Double Layer Post Procedure Diagnosis Same as Pre-procedure Electronic Signature(s) Signed: 09/25/2022 5:27:41 PM By: [name] RN, BSN Entered By: [name] on 09/25/2022 14:56:16 -------------------------------------------------------------------------------- Encounter Discharge Information Details Patient Name: Date of Service: [name] 09/25/2022 2:15 PM Medical Record Number: [id number] Patient Account Number: [id number] Date of Birth/Sex: Treating RN: 02/18/1950 (73 y.o. [name] Primary Care [name]: [name], [name] Other Clinician: Referring [name]: Treating [name]/Extender: [name] [name], [name] Weeks in Treatment: 9 Encounter Discharge Information Items Discharge Condition: Stable Ambulatory Status: Wheelchair Discharge Destination: Home Transportation: Private Auto Accompanied By: self Schedule Follow-up Appointment: Yes Clinical Summary of Care: Electronic Signature(s) Signed: 09/25/2022 5:27:41 PM By: [name] RN, BSN Entered By: [name] on 09/25/2022 14:58:05 -------------------------------------------------------------------------------- Lower Extremity Assessment Details Patient Name: Date of Service: [name], [name] 09/25/2022 2:15 PM Medical Record Number: [id number] Patient Account Number: [id number] Date of Birth/Sex: Treating RN: [birth year] (72 y.o. [name] Primary Care [name]: [name], [name] Other Clinician: Referring [name]: Treating [name]/Extender: [name] [name], [name] Weeks in Treatment: 9 Edema Assessment Assessed: [Left: Yes] [Right: No] Edema: [Left: Yes] [Right: Yes] Calf Left: Right: Point of Measurement: 39 cm From Medial Instep 38 cm 47  cm Ankle Left: Right: Point of Measurement: 10 cm From Medial Instep 30 cm 30 cm Electronic Signature(s) Signed: 09/25/2022 5:27:41 PM By: [name] RN, BSN Entered By: [name] on 09/25/2022 14:48:18 -------------------------------------------------------------------------------- Multi Wound Chart Details Patient Name: Date of Service: [name] 09/25/2022 2:15 PM Medical Record Number: [id number] Patient Account Number: [id number] Date of Birth/Sex: Treating RN: 09-28-1950 (72 y.o. [name] Primary Care Kalasia Crafton: [name], [name] Other Clinician: Referring  [name]: Treating [name]/Extender: [name] [name], [name] Weeks in Treatment: 9 Vital Signs Height(in): 64 Pulse(bpm): 101 Weight(lbs): 325 Blood Pressure(mmHg): 136/74 Body Mass Index(BMI): 55.8 Temperature(F): 98 Respiratory Rate(breaths/min): 22 Photos: [[name]:[name]] Left, Circumferential Lower Leg Right, Circumferential Lower Leg [name] Wound Location: Gradually Appeared Gradually Appeared [name] Wounding Event: Lymphedema Lymphedema [name] Primary Etiology: Anemia, Arrhythmia, Hypertension, Anemia, Arrhythmia, Hypertension, [name] Comorbid History: Type II Diabetes, Gout, Osteoarthritis, Type II Diabetes, Gout, Osteoarthritis, Received Radiation Received Radiation 07/05/2022 07/05/2022 [name] Date Acquired: 9 9 [name] Weeks of Treatment: Open Open [name] Wound Status: Yes No [name] Wound Recurrence: Yes Yes [name] Clustered Wound: 1 [name] [name] Clustered Quantity: 0x0x0 2x2x0.1 [name] Measurements L x W x D (cm) 0 3.142 [name] A (cm) : rea 0 0.314 [name] Volume (cm) : 100.00% 99.20% [name] % Reduction in Area: 100.00% 99.20% [name] % Reduction in Volume: Full Thickness Without Exposed Full Thickness Without Exposed [name] Classification: Support Structures Support Structures None Present Small [name] Exudate Amount: [name] Serosanguineous [name] Exudate Type: [name] red, brown [name] Exudate  Color: Distinct, outline attached Distinct, outline attached [name] Wound Margin: None Present (0%) Large (67-100%) [name] Granulation Amount: [name] Pink, Pale [name] Granulation Quality: None Present (0%) None Present (0%) [name] Necrotic Amount: Fascia: No Fascia: No [name] Exposed Structures: Fat Layer (Subcutaneous Tissue): No Fat Layer (Subcutaneous Tissue): No Tendon: No Tendon: No Muscle: No Muscle: No Joint: No Joint: No Bone: No Bone: No Large (67-100%) Large (67-100%) [name] Epithelialization: [name] Compression Therapy [name] Procedures Performed: Treatment Notes Wound #1R (Lower Leg) Wound Laterality: Left, Circumferential Cleanser Peri-Wound Care Topical Primary Dressing Secondary Dressing Secured With Compression Wrap Compression Stockings Add-Ons Wound #2 (Lower Leg) Wound Laterality: Right, Circumferential Cleanser Peri-Wound Care Sween Lotion (Moisturizing lotion) Discharge Instruction: Apply moisturizing lotion as directed Topical Primary Dressing Maxorb Extra Calcium Alginate Dressing, 4x4 in Discharge Instruction: Apply calcium alginate to wound bed as instructed Secondary Dressing ABD Pad, 8x10 Discharge Instruction: Apply over primary dressing as directed. Secured With Compression Wrap CoFlex TLC XL 2-layer Compression System 4x7 (in/yd) Discharge Instruction: Apply CoFlex 2-layer compression as directed. (alt for 4 layer) Compression Stockings Add-Ons Electronic Signature(s) Signed: 09/25/2022 4:43:30 PM By: [name] DO Signed: 09/25/2022 5:27:41 PM By: [name] RN, BSN Entered By: [name] on 09/25/2022 15:18:31 -------------------------------------------------------------------------------- Multi-Disciplinary Care Plan Details Patient Name: Date of Service: [place], [name] 09/25/2022 2:15 PM Medical Record Number: [id number] Patient Account Number: [id number] Date of Birth/Sex: Treating RN: 12/29/1950 (72 y.o. [name] Primary  Care Normagene Harvie: [name], [name] Other Clinician: Referring Courtney Fenlon: Treating Oriel Ojo/Extender: [name] [name], [name] Weeks in Treatment: 9 Active Inactive Pain, Acute or Chronic Nursing Diagnoses: Pain, acute or chronic: actual or potential Potential alteration in comfort, pain Goals: Patient will verbalize adequate pain control and receive pain control interventions during procedures as needed Date Initiated: 07/19/2022 Target Resolution Date: 09/28/2022 Goal Status: Active Patient/caregiver will verbalize comfort level met Date Initiated: 07/19/2022 Date Inactivated: 08/06/2022 Target Resolution Date: 08/06/2022 Goal Status: Met Interventions: Encourage patient to take pain medications as prescribed Provide education on pain management Reposition patient for comfort Treatment Activities: Administer pain control measures as ordered : 07/19/2022 Notes: Venous Leg Ulcer Nursing Diagnoses: Actual venous Insuffiency (use after diagnosis is confirmed) Potential for venous Insuffiency (use before diagnosis confirmed) Goals: Non-invasive venous studies are completed as ordered Date Initiated: 07/19/2022 Target Resolution Date: 09/28/2022 Goal Status: Active Interventions: Assess peripheral edema status every visit. Compression as ordered  Provide education on venous insufficiency Treatment Activities: Non-invasive vascular studies : 07/19/2022 Notes: Wound/Skin Impairment Nursing Diagnoses: Knowledge deficit related to ulceration/compromised skin integrity Goals: Patient/caregiver will verbalize understanding of skin care regimen Date Initiated: 07/19/2022 Target Resolution Date: 09/28/2022 Goal Status: Active Interventions: Assess patient/caregiver ability to perform ulcer/skin care regimen upon admission and as needed Assess ulceration(s) every visit Provide education on ulcer and skin care Treatment Activities: Skin care regimen initiated :  07/19/2022 Topical wound management initiated : 07/19/2022 Notes: Electronic Signature(s) Signed: 09/25/2022 5:27:41 PM By: [name] RN, BSN Entered By: [name] on 09/25/2022 14:57:17 -------------------------------------------------------------------------------- Pain Assessment Details Patient Name: Date of Service: [name] 09/25/2022 2:15 PM Medical Record Number: [id number] Patient Account Number: [id number] Date of Birth/Sex: Treating RN: [birth year] (72 y.o. [name] Primary Care Jacen Carlini: [name], [name] Other Clinician: Referring Merrilee Ancona: Treating Silvester Reierson/Extender: [name] [name], [name] Weeks in Treatment: 9 Active Problems Location of Pain Severity and Description of Pain Patient Has Paino Yes Site Locations Pain Location: Pain Location: Generalized Pain, Pain in Ulcers Rate the pain. Current Pain Level: 2 Pain Management and Medication Current Pain Management: Electronic Signature(s) Signed: 09/25/2022 4:43:35 PM By: [name] Signed: 09/25/2022 5:27:41 PM By: [name] RN, BSN Entered By: [name] on 09/25/2022 14:35:11 -------------------------------------------------------------------------------- Patient/Caregiver Education Details Patient Name: Date of Service: [name] 9/26/2023andnbsp2:15 PM Medical Record Number: [id number] Patient Account Number: [id number] Date of Birth/Gender: Treating RN: Jan 15, 1950 (72 y.o. [name] Primary Care Physician: [name], [name] Other Clinician: Referring Physician: Treating Physician/Extender: [name] [name], [name] Weeks in Treatment: 9 Education Assessment Education Provided To: Patient Education Topics Provided Wound/Skin Impairment: Handouts: Skin Care Do's and Dont's Methods: Explain/Verbal Responses: Reinforcements needed Electronic Signature(s) Signed: 09/25/2022 5:27:41 PM By: [name] RN,  BSN Entered By: [name] on 09/25/2022 14:57:32 -------------------------------------------------------------------------------- Wound Assessment Details Patient Name: Date of Service: [name] 09/25/2022 2:15 PM Medical Record Number: [id number] Patient Account Number: [id number] Date of Birth/Sex: Treating RN: 21-Jun-1950 (73 y.o. [name] Primary Care Nadiyah Zeis: [name], [name] Other Clinician: Referring Orbin Mayeux: Treating Leora Platt/Extender: [name] [name], [name] Weeks in Treatment: 9 Wound Status Wound Number: 1R Primary Lymphedema Etiology: Wound Location: Left, Circumferential Lower Leg Wound Open Wounding Event: Gradually Appeared Status: Date Acquired: 07/05/2022 Comorbid Anemia, Arrhythmia, Hypertension, Type II Diabetes, Gout, Weeks Of Treatment: 9 History: Osteoarthritis, Received Radiation Clustered Wound: Yes Photos Wound Measurements Length: (cm) Width: (cm) Depth: (cm) Clustered Quantity: Area: (cm) Volume: (cm) 0 % Reduction in Area: 100% 0 % Reduction in Volume: 100% 0 Epithelialization: Large (67-100%) 1 Tunneling: No 0 Undermining: No 0 Wound Description Classification: Full Thickness Without Exposed Support Structures Wound Margin: Distinct, outline attached Exudate Amount: None Present Foul Odor After Cleansing: No Slough/Fibrino No Wound Bed Granulation Amount: None Present (0%) Exposed Structure Necrotic Amount: None Present (0%) Fascia Exposed: No Fat Layer (Subcutaneous Tissue) Exposed: No Tendon Exposed: No Muscle Exposed: No Joint Exposed: No Bone Exposed: No Electronic Signature(s) Signed: 09/25/2022 5:27:41 PM By: [name] RN, BSN Entered By: [name] on 09/25/2022 14:46:56 -------------------------------------------------------------------------------- Wound Assessment Details Patient Name: Date of Service: [name] [place], [place] RIA [name] 09/25/2022 2:15 PM Medical Record Number:  [id number] Patient Account Number: [id number] Date of Birth/Sex: Treating RN: Nov 08, 1950 (72 y.o. [name] Primary Care Ebrima Ranta: [name], [name] Other Clinician: Referring Adell Panek:  Treating Doil Kamara/Extender: [name] [name], [name] Weeks in Treatment: 9 Wound Status Wound Number: 2 Primary Lymphedema Etiology: Wound Location: Right, Circumferential Lower Leg Wound Open Wounding Event: Gradually Appeared Status: Status: Date Acquired: 07/05/2022 Comorbid Anemia, Arrhythmia, Hypertension, Type II Diabetes, Gout, Weeks Of Treatment: 9 History: Osteoarthritis, Received Radiation Clustered Wound: Yes Photos Wound Measurements Length: (cm) 2 Width: (cm) 2 Depth: (cm) 0.1 Area: (cm) 3.142 Volume: (cm) 0.314 % Reduction in Area: 99.2% % Reduction in Volume: 99.2% Epithelialization: Large (67-100%) Tunneling: No Undermining: No Wound Description Classification: Full Thickness Without Exposed Support Structures Wound Margin: Distinct, outline attached Exudate Amount: Small Exudate Type: Serosanguineous Exudate Color: red, brown Foul Odor After Cleansing: No Slough/Fibrino No Wound Bed Granulation Amount: Large (67-100%) Exposed Structure Granulation Quality: Pink, Pale Fascia Exposed: No Necrotic Amount: None Present (0%) Fat Layer (Subcutaneous Tissue) Exposed: No Tendon Exposed: No Muscle Exposed: No Joint Exposed: No Bone Exposed: No Treatment Notes Wound #2 (Lower Leg) Wound Laterality: Right, Circumferential Cleanser Peri-Wound Care Sween Lotion (Moisturizing lotion) Discharge Instruction: Apply moisturizing lotion as directed Topical Primary Dressing Maxorb Extra Calcium Alginate Dressing, 4x4 in Discharge Instruction: Apply calcium alginate to wound bed as instructed Secondary Dressing ABD Pad, 8x10 Discharge Instruction: Apply over primary dressing as directed. Secured With Compression Wrap CoFlex TLC XL 2-layer  Compression System 4x7 (in/yd) Discharge Instruction: Apply CoFlex 2-layer compression as directed. (alt for 4 layer) Compression Stockings Add-Ons Electronic Signature(s) Signed: 09/25/2022 5:27:41 PM By: [name] RN, BSN Entered By: [name] on 09/25/2022 14:55:50 -------------------------------------------------------------------------------- Vitals Details Patient Name: Date of Service: [name], [name] 09/25/2022 2:15 PM Medical Record Number: [id number] Patient Account Number: [id number] Date of Birth/Sex: Treating RN: 1950-09-11 (72 y.o. [name], [name] Primary Care Torrell Krutz: [name], [name] Other Clinician: Referring Jerick Khachatryan: Treating Britney Captain/Extender: [name] [name], [name] Weeks in Treatment: 9 Vital Signs Time Taken: 14:34 Temperature (F): 98 Height (in): 64 Pulse (bpm): 89 Weight (lbs): 325 Respiratory Rate (breaths/min): 22 Body Mass Index (BMI): 55.8 Blood Pressure (mmHg): 136/74 Reference Range: 80 - 120 mg / dl Airway Pulse Oximetry (%): 97 Electronic Signature(s) Signed: 09/25/2022 4:43:35 PM By: [name] Entered By: [name] on 09/25/2022 14:34:56

## 2022-09-25 NOTE — Progress Notes (Signed)
[name], [name] ([id number]) Visit Report for 09/25/2022 Chief Complaint Document Details Patient Name: Date of Service: [place], [name] 09/25/2022 2:15 PM Medical Record Number: [id number] Patient Account Number: [id number] Date of Birth/Sex: Treating RN: 09/09/1950 (72 y.o. [name] Primary Care Provider: [facility] A Jeri [place], [name] Other Clinician: Referring Provider: Treating Provider/Extender: [name] [name], [name] Weeks in Treatment: 9 Information Obtained from: Patient Chief Complaint 07/19/2022; bilateral lower extremity wounds Electronic Signature(s) Signed: 09/25/2022 4:43:30 PM By: [name] DO Entered By: [name] on 09/25/2022 15:18:38 -------------------------------------------------------------------------------- HPI Details Patient Name: Date of Service: [name], [place] 09/25/2022 2:15 PM Medical Record Number: [id number] Patient Account Number: [id number] Date of Birth/Sex: Treating RN: 02/03/1950 (72 y.o. [name] Primary Care Provider: [name], [name] Other Clinician: Referring Provider: Treating Provider/Extender: [name] [name], [name] Weeks in Treatment: 9 History of Present Illness HPI Description: Admission 07/19/2022 Ms. [name] Is a 72 year old female with a past medical history of endometrial cancer, lymphedema, insulin-dependent type 2 diabetes that presents to the clinic for a 4-week history of weeping to her lower extremities bilaterally. She visited the ED for this issue on 07/07/2022 and she was prescribed doxycycline For bilateral lower extremity cellulitis. She has completed this course. She currently denies signs of infection. She does not have compression stockings. She states she recently gained weight over the past couple months. She has never had wounds before. 7/28; patient presents for follow-up. We have been using silver alginate under Kerlix/Coban. She is scheduled to get  her ABIs with TBI's on 8/7. She states that the wraps slid down to her mid shin. She denies signs of infection. 8/7; patient presents for follow-up. We have been using silver alginate under Kerlix/Coban. She had arterial studies done that showed an ABI of 0.86 on the right and 0.82 on the left with multiphasic waveforms throughout the lower extremities. 8/14; patient presents for follow-up. We have been using silver alginate under 3 layer compression. She tolerated the wrap well. She has no issues or complaints today. 8/21; patient presents for follow-up. We have been using silver alginate under 3 layer compression. She has no issues or complaints today. She has her juxta lite compressions with her. 8/28; patient presents for follow-up. We have been using silver alginate under 3 layer compression. She has no issues or complaints today. 9/5; patient presents for follow-up. We have been using silver alginate under 4 layer compression to both extremities bilaterally. She has her juxta lite compression with her today. 9/12; patient presents for follow-up. We have been using silver alginate under 4-layer compression to the right leg. She was supposed to wear her juxta lite compression to the left lower extremity. She states she could not get it on so she has not been wearing it. She also has not been taking her Lasix. 9/19; patient presents for follow-up. We have been using silver alginate under 4-layer compression to her lower extremities bilaterally. She has restarted taking her Lasix on a daily basis. 9/26; patient presents for follow-up. We have been using silver alginate under 4-layer compression to the lower extremities bilaterally. Her left lower extremity wounds have healed. She has juxta lite compressions. Electronic Signature(s) Signed: 09/25/2022 4:43:30 PM By: [name] DO Entered By: [name] on 09/25/2022  15:19:23 -------------------------------------------------------------------------------- Physical Exam Details Patient Name: Date of Service: [name] 09/25/2022 2:15 PM Medical Record Number: [id number] Patient Account Number: [id number] Date  of Birth/Sex: Treating RN: 08/01/1950 (72 y.o. [name] Primary Care Provider: [name], [name] Other Clinician: Referring Provider: Treating Provider/Extender: [name] [name], [name] Weeks in Treatment: 9 Constitutional respirations regular, non-labored and within target range for patient.. Cardiovascular 2+ dorsalis pedis/posterior tibialis pulses. Psychiatric pleasant and cooperative. Notes Right lower extremity: T the anterior aspect there is an open wound with granulation tissue present. Left lower extremity: Epithelization to the previous wound o sites. No weeping noted. No signs of surrounding soft tissue infection Electronic Signature(s) Signed: 09/25/2022 4:43:30 PM By: [name] DO Entered By: [name] on 09/25/2022 15:20:47 -------------------------------------------------------------------------------- Physician Orders Details Patient Name: Date of Service: [name] 09/25/2022 2:15 PM Medical Record Number: [id number] Patient Account Number: [id number] Date of Birth/Sex: Treating RN: [birth year] (72 y.o. [name] Primary Care Provider: [name], [name] Other Clinician: Referring Provider: Treating Provider/Extender: [name] [name], [name] Weeks in Treatment: 9 Verbal / Phone Orders: No Diagnosis Coding Follow-up Appointments ppointment in 1 week. - Dr. Heber [place] and [place], Room 8 Tuesday Return A ppointment in 2 weeks. - Dr. Heber [place] and [place], Room 8 Tuesday Return A Other: - take the lasix daily as prescribed. Someone will call you from [place] to schedule venous studies tests. Bathing/ Shower/ Hygiene May shower  with protection but do not get wound dressing(s) wet. Edema Control - Lymphedema / SCD / Other Lymphedema Pumps. Use Lymphedema pumps on leg(s) 2-3 times a day for 45-60 minutes. If wearing any wraps or hose, do not remove them. Continue exercising as instructed. - 2-3 times a day throughout the day. Elevate legs to the level of the heart or above for 30 minutes daily and/or when sitting, a frequency of: - 3-4 times a day throughout the day. Avoid standing for long periods of time. Exercise regularly Moisturize legs daily. - left leg every night. Compression stocking or Garment 30-40 mm/Hg pressure to: - juxtalite HD to left leg- apply in the morning and remove at night. Wound Treatment Wound #2 - Lower Leg Wound Laterality: Right, Circumferential Peri-Wound Care: Sween Lotion (Moisturizing lotion) 1 x Per Week/30 Days Discharge Instructions: Apply moisturizing lotion as directed Prim Dressing: Maxorb Extra Calcium Alginate Dressing, 4x4 in 1 x Per Week/30 Days ary Discharge Instructions: Apply calcium alginate to wound bed as instructed Secondary Dressing: ABD Pad, 8x10 1 x Per Week/30 Days Discharge Instructions: Apply over primary dressing as directed. Compression Wrap: CoFlex TLC XL 2-layer Compression System 4x7 (in/yd) 1 x Per Week/30 Days Discharge Instructions: Apply CoFlex 2-layer compression as directed. (alt for 4 layer) Electronic Signature(s) Signed: 09/25/2022 4:43:30 PM By: [name] DO Entered By: [name] on 09/25/2022 15:20:54 -------------------------------------------------------------------------------- Problem List Details Patient Name: Date of Service: [name] 09/25/2022 2:15 PM Medical Record Number: [id number] Patient Account Number: [id number] Date of Birth/Sex: Treating RN: 1950/06/15 (72 y.o. [name] Primary Care Provider: [name], [name] Other Clinician: Referring Provider: Treating Provider/Extender: [name] [name], [name] Weeks in Treatment: 9 Active Problems ICD-10 Encounter Code Description Active Date MDM Diagnosis L97.812 Non-pressure chronic ulcer of other part of right lower leg with fat layer 07/19/2022 No Yes exposed L97.822 Non-pressure chronic ulcer of other part of left lower leg with fat layer exposed7/20/2023 No Yes I89.0 Lymphedema, not elsewhere classified 07/19/2022 No Yes I87.333 Chronic venous hypertension (idiopathic) with ulcer and inflammation of 07/19/2022 No  Yes bilateral lower extremity E11.622 Type 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 07/19/2022 No Yes 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 07/19/2022 No Yes Inactive Problems Resolved Problems Electronic Signature(s) Signed: 09/25/2022 4:43:30 PM By: [name] DO Entered By: [name] on 09/25/2022 15:18:21 -------------------------------------------------------------------------------- Progress Note Details Patient Name: Date of Service: [name] [place], [place] RIA N 09/25/2022 2:15 PM Medical Record Number: [id number] Patient Account Number: [id number] Date of Birth/Sex: Treating RN: 18-Apr-1950 (72 y.o. [name] Primary Care Provider: [name], [name] Other Clinician: Referring Provider: Treating Provider/Extender: [name] [name], [name] Weeks in Treatment: 9 Subjective Chief Complaint Information obtained from Patient 07/19/2022; bilateral lower extremity wounds History of Present Illness (HPI) Admission 07/19/2022 Ms. Jahira Swiss Is a 72 year old female with a past medical history of endometrial cancer, lymphedema, insulin-dependent type 2 diabetes that presents to the clinic for a 4-week history of weeping to her lower extremities bilaterally. She visited the ED for this issue on 07/07/2022 and she was prescribed doxycycline For bilateral lower extremity cellulitis. She has completed this course. She currently denies signs of infection. She does not  have compression stockings. She states she recently gained weight over the past couple months. She has never had wounds before. 7/28; patient presents for follow-up. We have been using silver alginate under Kerlix/Coban. She is scheduled to get her ABIs with TBI's on 8/7. She states that the wraps slid down to her mid shin. She denies signs of infection. 8/7; patient presents for follow-up. We have been using silver alginate under Kerlix/Coban. She had arterial studies done that showed an ABI of 0.86 on the right and 0.82 on the left with multiphasic waveforms throughout the lower extremities. 8/14; patient presents for follow-up. We have been using silver alginate under 3 layer compression. She tolerated the wrap well. She has no issues or complaints today. 8/21; patient presents for follow-up. We have been using silver alginate under 3 layer compression. She has no issues or complaints today. She has her juxta lite compressions with her. 8/28; patient presents for follow-up. We have been using silver alginate under 3 layer compression. She has no issues or complaints today. 9/5; patient presents for follow-up. We have been using silver alginate under 4 layer compression to both extremities bilaterally. She has her juxta lite compression with her today. 9/12; patient presents for follow-up. We have been using silver alginate under 4-layer compression to the right leg. She was supposed to wear her juxta lite compression to the left lower extremity. She states she could not get it on so she has not been wearing it. She also has not been taking her Lasix. 9/19; patient presents for follow-up. We have been using silver alginate under 4-layer compression to her lower extremities bilaterally. She has restarted taking her Lasix on a daily basis. 9/26; patient presents for follow-up. We have been using silver alginate under 4-layer compression to the lower extremities bilaterally. Her left lower  extremity wounds have healed. She has juxta lite compressions. Patient History Information obtained from Patient, Chart. Family History Cancer - Father, Diabetes - Siblings. Social History Former smoker - quit 1988, Marital Status - Married, Alcohol Use - Moderate - cocktails x2 a week, Drug Use - No History, Caffeine Use - Never. Medical History Hematologic/Lymphatic Patient has history of Anemia - iron Cardiovascular Patient has history of Arrhythmia - A. Fib 09/10/2022 dx started eliquis, Hypertension Endocrine Patient has history of Type II Diabetes Musculoskeletal Patient has history of Gout,  Osteoarthritis Oncologic Patient has history of Received Radiation - 2023 Hospitalization/Surgery History - one ovary removed 2020. - pilonidal cyst extraction 1969. - diverculitis temp colostomy. Medical A Surgical History Notes nd Cardiovascular hyperlipidemia Gastrointestinal GERD Diverticulitis Musculoskeletal carpel tunnel bilateral-surgery to right hand Oncologic endometrial cancer skin Ca Objective Constitutional respirations regular, non-labored and within target range for patient.. Vitals Time Taken: 2:34 PM, Height: 64 in, Weight: 325 lbs, BMI: 55.8, Temperature: 98 F, Pulse: 89 bpm, Respiratory Rate: 22 breaths/min, Blood Pressure: 136/74 mmHg, Pulse Oximetry: 97 %. Cardiovascular 2+ dorsalis pedis/posterior tibialis pulses. Psychiatric pleasant and cooperative. General Notes: Right lower extremity: T the anterior aspect there is an open wound with granulation tissue present. Left lower extremity: Epithelization to the o previous wound sites. No weeping noted. No signs of surrounding soft tissue infection Integumentary (Hair, Skin) Wound #1R status is Open. Original cause of wound was Gradually Appeared. The date acquired was: 07/05/2022. The wound has been in treatment 9 weeks. The wound is located on the Left,Circumferential Lower Leg. The wound measures 0cm length x  0cm width x 0cm depth; 0cm^2 area and 0cm^3 volume. There is no tunneling or undermining noted. There is a none present amount of drainage noted. The wound margin is distinct with the outline attached to the wound base. There is no granulation within the wound bed. There is no necrotic tissue within the wound bed. Wound #2 status is Open. Original cause of wound was Gradually Appeared. The date acquired was: 07/05/2022. The wound has been in treatment 9 weeks. The wound is located on the Right,Circumferential Lower Leg. The wound measures 2cm length x 2cm width x 0.1cm depth; 3.142cm^2 area and 0.314cm^3 volume. There is no tunneling or undermining noted. There is a small amount of serosanguineous drainage noted. The wound margin is distinct with the outline attached to the wound base. There is large (67-100%) pink, pale granulation within the wound bed. There is no necrotic tissue within the wound bed. Assessment Active Problems ICD-10 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d Non-pressure chronic ulcer of other part of left lower leg with fat layer exposed Lymphedema, not elsewhere classified Chronic venous hypertension (idiopathic) with ulcer and inflammation of bilateral lower extremity Type 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 Malignant neoplasm of endometrium Patient's left lower extremity wounds have healed. I recommended the patient use her compression garment to this area. She still has an open wound to the right lower extremity and I recommended continuing the course with silver alginate under 2 layer Coflex. Follow-up in 1 week. Procedures Wound #2 Pre-procedure diagnosis of Wound #2 is a Lymphedema located on the Right,Circumferential Lower Leg . There was a Double Layer Compression Therapy Procedure by [name], RN. Post procedure Diagnosis Wound #2: Same as Pre-Procedure Plan Follow-up Appointments: Return Appointment in 1 week.  - Dr. Heber [place] and [place], Room 8 Tuesday Return Appointment in 2 weeks. - Dr. Heber [place] and [place], Room 8 Tuesday Other: - take the lasix daily as prescribed. Someone will call you from [place] to schedule venous studies tests. Bathing/ Shower/ Hygiene: May shower with protection but do not get wound dressing(s) wet. Edema Control - Lymphedema / SCD / Other: Lymphedema Pumps. Use Lymphedema pumps on leg(s) 2-3 times a day for 45-60 minutes. If wearing any wraps or hose, do not remove them. Continue exercising as instructed. - 2-3 times a day throughout the day. Elevate legs to the level of the heart or above  for 30 minutes daily and/or when sitting, a frequency of: - 3-4 times a day throughout the day. Avoid standing for long periods of time. Exercise regularly Moisturize legs daily. - left leg every night. Compression stocking or Garment 30-40 mm/Hg pressure to: - juxtalite HD to left leg- apply in the morning and remove at night. WOUND #2: - Lower Leg Wound Laterality: Right, Circumferential Peri-Wound Care: Sween Lotion (Moisturizing lotion) 1 x Per Week/30 Days Discharge Instructions: Apply moisturizing lotion as directed Prim Dressing: Maxorb Extra Calcium Alginate Dressing, 4x4 in 1 x Per Week/30 Days ary Discharge Instructions: Apply calcium alginate to wound bed as instructed Secondary Dressing: ABD Pad, 8x10 1 x Per Week/30 Days Discharge Instructions: Apply over primary dressing as directed. Com pression Wrap: CoFlex TLC XL 2-layer Compression System 4x7 (in/yd) 1 x Per Week/30 Days Discharge Instructions: Apply CoFlex 2-layer compression as directed. (alt for 4 layer) 1. Silver alginate under 2 layer Coflex to the right lower extremity 2. Juxta light compression to the left lower extremity 3. Follow-up in 1 week Electronic Signature(s) Signed: 09/25/2022 4:43:30 PM By: [name] DO Entered By: [name] on 09/25/2022  15:21:45 -------------------------------------------------------------------------------- HxROS Details Patient Name: Date of Service: [name] [place], [place] RIA N 09/25/2022 2:15 PM Medical Record Number: [id number] Patient Account Number: [id number] Date of Birth/Sex: Treating RN: [birth year] (72 y.o. [name] Primary Care Provider: [name], [name] Other Clinician: Referring Provider: Treating Provider/Extender: [name] [name], [name] Weeks in Treatment: 9 Information Obtained From Patient Chart Hematologic/Lymphatic Medical History: Positive for: Anemia - iron Cardiovascular Medical History: Positive for: Arrhythmia - A. Fib 09/10/2022 dx started eliquis; Hypertension Past Medical History Notes: hyperlipidemia Gastrointestinal Medical History: Past Medical History Notes: GERD Diverticulitis Endocrine Medical History: Positive for: Type II Diabetes Time with diabetes: 72 years old Treated with: Insulin, Diet Blood sugar tested every day: Yes Tested : three times a week. Musculoskeletal Medical History: Positive for: Gout; Osteoarthritis Past Medical History Notes: carpel tunnel bilateral-surgery to right hand Oncologic Medical History: Positive for: Received Radiation - 2023 Past Medical History Notes: endometrial cancer skin Ca Immunizations Pneumococcal Vaccine: Received Pneumococcal Vaccination: Yes Received Pneumococcal Vaccination On or After 60th Birthday: No Implantable Devices No devices added Hospitalization / Surgery History Type of Hospitalization/Surgery one ovary removed 2020 pilonidal cyst extraction 1969 diverculitis temp colostomy Family and Social History Cancer: Yes - Father; Diabetes: Yes - Siblings; Former smoker - quit 1988; Marital Status - Married; Alcohol Use: Moderate - cocktails x2 a week; Drug Use: No History; Caffeine Use: Never; Financial Concerns: No; Food, Clothing or Shelter Needs: No; Support System  Lacking: No; Transportation Concerns: No Electronic Signature(s) Signed: 09/25/2022 4:43:30 PM By: [name] DO Signed: 09/25/2022 5:27:41 PM By: [name] RN, BSN Entered By: [name] on 09/25/2022 15:19:29 -------------------------------------------------------------------------------- SuperBill Details Patient Name: Date of Service: [name] 09/25/2022 Medical Record Number: [id number] Patient Account Number: [id number] Date of Birth/Sex: Treating RN: May 02, 1950 (72 y.o. [name] Primary Care Provider: [name], [name] Other Clinician: Referring Provider: Treating Provider/Extender: [name] [name], [name] Weeks in Treatment: 9 Diagnosis Coding ICD-10 Codes Code Description [phone] 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d L97.822 Non-pressure chronic ulcer of other part of left lower leg with fat layer exposed I89.0 Lymphedema, not elsewhere classified I87.333 Chronic venous hypertension (idiopathic) with ulcer and inflammation of bilateral lower extremity E11.622 Type 2 diabetes  mellitus with other skin ulcer 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 Facility Procedures Physician Procedures : CPT4 Code Description Modifier [id number] - WC PHYS LEVEL 3 - EST PT ICD-10 Diagnosis Description B84.784 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d L97.822 Non-pressure chronic ulcer of other part of left  lower leg with fat layer exposed I89.0 Lymphedema, not elsewhere classified I87.333 Chronic venous hypertension (idiopathic) with ulcer and inflammation of bilateral lower extremity Quantity: 1 Electronic Signature(s) Signed: 09/25/2022 4:43:30 PM By: [name] DO Entered By: [name] on 09/25/2022 15:22:02

## 2022-09-26 ENCOUNTER — Ambulatory Visit (HOSPITAL_COMMUNITY): Payer: Medicare Other | Attending: Internal Medicine

## 2022-09-26 DIAGNOSIS — Z79899 Other long term (current) drug therapy: Secondary | ICD-10-CM | POA: Diagnosis not present

## 2022-09-26 DIAGNOSIS — E118 Type 2 diabetes mellitus with unspecified complications: Secondary | ICD-10-CM | POA: Insufficient documentation

## 2022-09-26 DIAGNOSIS — E782 Mixed hyperlipidemia: Secondary | ICD-10-CM | POA: Diagnosis not present

## 2022-09-26 DIAGNOSIS — I739 Peripheral vascular disease, unspecified: Secondary | ICD-10-CM | POA: Insufficient documentation

## 2022-09-26 DIAGNOSIS — I10 Essential (primary) hypertension: Secondary | ICD-10-CM | POA: Insufficient documentation

## 2022-09-26 DIAGNOSIS — I4891 Unspecified atrial fibrillation: Secondary | ICD-10-CM | POA: Insufficient documentation

## 2022-09-26 MED ORDER — PERFLUTREN LIPID MICROSPHERE
3.0000 mL | INTRAVENOUS | Status: AC | PRN
  Administered 2022-09-26: 3 mL via INTRAVENOUS

## 2022-09-27 ENCOUNTER — Ambulatory Visit: Payer: Medicare Other | Attending: Cardiovascular Disease

## 2022-09-27 DIAGNOSIS — I10 Essential (primary) hypertension: Secondary | ICD-10-CM | POA: Diagnosis not present

## 2022-09-27 DIAGNOSIS — Z79899 Other long term (current) drug therapy: Secondary | ICD-10-CM

## 2022-09-28 ENCOUNTER — Other Ambulatory Visit: Payer: Self-pay

## 2022-09-28 ENCOUNTER — Inpatient Hospital Stay: Payer: Medicare Other | Attending: Gynecologic Oncology | Admitting: Gynecologic Oncology

## 2022-09-28 ENCOUNTER — Telehealth: Payer: Self-pay

## 2022-09-28 VITALS — BP 134/79 | HR 117 | Temp 98.7°F | Resp 18 | Ht 64.0 in | Wt 345.0 lb

## 2022-09-28 DIAGNOSIS — I10 Essential (primary) hypertension: Secondary | ICD-10-CM

## 2022-09-28 DIAGNOSIS — Z8542 Personal history of malignant neoplasm of other parts of uterus: Secondary | ICD-10-CM | POA: Diagnosis not present

## 2022-09-28 DIAGNOSIS — Z87891 Personal history of nicotine dependence: Secondary | ICD-10-CM | POA: Diagnosis not present

## 2022-09-28 DIAGNOSIS — Z923 Personal history of irradiation: Secondary | ICD-10-CM | POA: Insufficient documentation

## 2022-09-28 DIAGNOSIS — N736 Female pelvic peritoneal adhesions (postinfective): Secondary | ICD-10-CM | POA: Diagnosis not present

## 2022-09-28 DIAGNOSIS — C541 Malignant neoplasm of endometrium: Secondary | ICD-10-CM

## 2022-09-28 LAB — ECHOCARDIOGRAM COMPLETE
Area-P 1/2: 3.69 cm2
S' Lateral: 3.4 cm

## 2022-09-28 LAB — BASIC METABOLIC PANEL
BUN/Creatinine Ratio: 20 (ref 12–28)
BUN: 23 mg/dL (ref 8–27)
CO2: 23 mmol/L (ref 20–29)
Calcium: 9.4 mg/dL (ref 8.7–10.3)
Chloride: 105 mmol/L (ref 96–106)
Creatinine, Ser: 1.13 mg/dL — ABNORMAL HIGH (ref 0.57–1.00)
Glucose: 163 mg/dL — ABNORMAL HIGH (ref 70–99)
Potassium: 4.3 mmol/L (ref 3.5–5.2)
Sodium: 141 mmol/L (ref 134–144)
eGFR: 51 mL/min/{1.73_m2} — ABNORMAL LOW (ref >59)

## 2022-09-28 LAB — PRO B NATRIURETIC PEPTIDE: NT-Pro BNP: 1615 pg/mL — ABNORMAL HIGH (ref 0–301)

## 2022-09-28 MED ORDER — CARVEDILOL 12.5 MG PO TABS: 12.5000 mg | ORAL_TABLET | Freq: Two times a day (BID) | ORAL | 3 refills | Status: DC

## 2022-09-28 NOTE — Patient Instructions (Signed)
It was very nice to meet you today.  I will see you back for a visit in 3 months.  We will plan to get a pelvic MRI to look at the uterus prior to that visit.  If you develop any of the symptoms that we talked about at today's visit between now and your next visit, please call to see me sooner.

## 2022-09-28 NOTE — Telephone Encounter (Signed)
AVS was printed with a scheduled MRI on there for 10/13.  Per Dr Berline Lopes wanted MRI scheduled closer to December 29th visit.  Called and had MRI rescheduled to 12/4 at 10:00am.  Called patient.  Patient confirmed and understood.

## 2022-09-28 NOTE — Progress Notes (Signed)
Gynecologic Oncology Return Clinic Visit  09/28/22  Reason for Visit: Surveillance visit in the setting of endometrial cancer being treated hormonally  Treatment History: Patient reports a history of postmenopausal bleeding since July 2020.  Is been persistent low low level bleeding, spotting but no heavy passage of clots.  She reported this to her gynecologist who ordered a transvaginal ultrasound on June 24, 2019.  This revealed a uterus measuring 6.7 x 3.4 x 5.5 cm with an endometrial thickness of 1.4 cm.  The ovaries were not visualized.   She was taken to the operating room for a curettage and polypectomy on July 17, 2019 which revealed FIGO grade 1 endometrioid adenocarcinoma.   Patient has a complex medical history.  She is morbidly obese with a BMI of 48 kg/m.  She experienced ruptured diverticulitis in 1997 which required a diverting colostomy and Hartman's pouch.  She 6 weeks later she was taken to the operating room for laparotomy and reversal of Hartman's.  She reports that the surgeon described her abdomen is looking like a gallon of superglue had been poured inside her.  It was an extensively long surgery.  She is unsure if additional bowel resections were necessary.  She had a 1 month hospitalization following this reversal of colostomy.  She developed a bowel obstruction during that hospitalization and required reoperation to resolve this.   In 2000 she developed a growth on an ovary (she cannot remember which side).  For this she underwent laparotomy and oophorectomy.  This surgery was performed in [place].  She was planning to have a hysterectomy at that procedure, however the surgeon aborted plans for hysterectomy and performed only oophorectomy after encountering significant adhesive disease.  She states that he told her surgery would have needed to the last 6 to 8 hours in order to perform hysterectomy due to the adhesive disease.   The patient has type 2 diabetes mellitus and  has had this for greater than 30 years.  She reports having no endorgan disease.  Her hemoglobin A1c was 5.4% in May 2020.   Her family history is significant for a father with skin cancer.  She has 3 sisters with cancer including one with endometrial cancer who is obese, a sister with stage II 3 breast cancer, and multiple myeloma.   She has had 2 prior vaginal deliveries.   She lives in [place].   On August 20, 2019 she underwent an attempt at a robotic assisted total hysterectomy BSO and sentinel lymph node biopsy.  The surgery was aborted after laparoscopic entry into the left upper quadrant encountered dense adhesions throughout the entirety of the abdomen which included dense colonic and small bowel adhesions to the anterior abdominal wall.  The density of these adhesions and their comprehensive nature across the entirety of her abdominal wall meant that it was not possible to place additional laparoscopic ports to perform adhesiolysis.  She would have required a laparotomy in order to achieve hysterectomy, and the patient had declined this preoperatively, and had elected to proceed with D&C with progestin releasing IUD placement if hysterectomy was not possible via minimally invasive surgical route.  Therefore we aborted the laparoscopic procedure and performed a D&C procedure with placement of a progestin releasing IUD.   Final pathology from the [facility] specimen showed adenocarcinoma favor endometrial primary.  It was difficult to grade the specimen due to his fragmented nature.  Repeat biopsy (office) sampling in December, 2020 showed complete clinical response with no residual carcinoma.  The patient then transferred care to Dr. Sheilah Mins in [name].  Repeat endometrial biopsy on 03/23/2021 showed residual endometrioid adenocarcinoma.  Repeat biopsy on 06/22/2021 showed FIGO grade 2 endometrioid adenocarcinoma.  There is a comment that her Megace, which must of been started prior to this, was  increased to 160 mg twice daily.  Endometrial biopsy on 09/22/2021 showed residual endometrioid adenocarcinoma, FIGO grade 1-2, in the background of endometrium with exogenous hormone effect.  Pelvic MRI on 12/05/2021 noted a 3.3 cm simple left ovarian cyst, colonic diverticulosis, anterior endometrial mass in the uterine body with an IUD adjacent, suspicious for endometrial cancer.  Repeat endometrial biopsy on 02/01/2022 showed residual endometrial carcinoma, FIGO grade 1.  Staging CT of the chest, abdomen, and pelvis on 02/15/2022 revealed no adenopathy, no aggressive appearing osseous lesions.  Diastases recti with superimposed shallow ventral wall hernia measuring 1 x 7 cm.  Cholelithiasis noted without acute cholecystitis.  03/27/22: IUD removed.  Patient proceeded with definitive radiation therapy from 03/06/2022 - 04/18/2022.  She was treated with EBRT (4500 cGy in 25 fractions) utilizing IMRT treatment technique.  This was followed by weekly T&O HDR treatment to a total dose of 3000 cGy in 5 fractions.  Total treatment dose was 7500 cGy.  Treatment was tolerated well other than fatigue and mild loose stools.  Posttreatment pelvic MRI on 06/12/2022 showed a slightly less bulky uterus.  Significant decrease in the endometrium compared to previous exam with endometrium now measuring 4 mm in thickness (previously was 2.7 cm).  Slight heterogenous signal in the endometrium.  No adenopathy.  She was last seen for follow-up on 06/20/2022 with radiation oncology.  She was doing well at that time without any vaginal bleeding or cramping.  Interval History: Patient reports overall doing well.  She is continuing to recover from side effects of radiation.  She moved back from [name] in July.  She notes that fatigue is slowly improving as is her ability to concentrate on tasks.  She very much enjoys reading, but has been unable to read a book since treatment.  She denies any vaginal bleeding.  Continues to  have some intermittent scant gray/black discharge, which has been ongoing since finishing radiation.  She denies any abdominal or pelvic pain, denies any cramping.  Continues to have diarrhea, which developed after radiation.  This is improving.  She uses Imodium as needed.  Patient notes having just been diagnosed with atrial fibrillation.  Past Medical/Surgical History: Past Medical History:  Diagnosis Date   Arthritis    Carpal tunnel syndrome    bilateral, had surgery on right hand   Diabetes mellitus without complication ([place])    Diverticulitis    Endometrial cancer ([place])    GERD (gastroesophageal reflux disease)    Gout    History of iron deficiency    Hyperlipidemia    Hypertension    Skin cancer     Past Surgical History:  Procedure Laterality Date   CARPAL TUNNEL RELEASE Right    COLON SURGERY     COLONOSCOPY     x2   COLOSTOMY     COLOSTOMY REVERSAL     DILATATION & CURETTAGE/HYSTEROSCOPY WITH MYOSURE N/A 07/17/2019   Procedure: DILATATION & CURETTAGE/HYSTEROSCOPY WITH MYOSURE;  Surgeon: Louretta Shorten, MD;  Location: [facility];  Service: Gynecology;  Laterality: N/A;   OOPHORECTOMY     unilateral   PILONIDAL CYST EXCISION     ROBOTIC ASSISTED TOTAL HYSTERECTOMY WITH BILATERAL SALPINGO OOPHERECTOMY N/A 08/20/2019  Procedure: DIAGNSOTIC LAPAROSCOPY, DILATION AND CURETTAGE, IUD PLACEMENT ;  Surgeon: Everitt Amber, MD;  Location: [facility];  Service: Gynecology;  Laterality: N/A;   SKIN CANCER EXCISION      Family History  Problem Relation Age of Onset   Diabetes Sister    Skin cancer Father     Social History   Socioeconomic History   Marital status: Married    Spouse name: Not on file   Number of children: Not on file   Years of education: Not on file   Highest education level: Not on file  Occupational History   Not on file  Tobacco Use   Smoking status: Former    Packs/day: 3.00    Years: 22.00    Total pack years: 66.00    Types:  Cigarettes    Quit date: 06/08/1987    Years since quitting: 35.3   Smokeless tobacco: Never  Vaping Use   Vaping Use: Never used  Substance and Sexual Activity   Alcohol use: Yes    Comment: twice per week   Drug use: No   Sexual activity: Yes    Birth control/protection: Post-menopausal  Other Topics Concern   Not on file  Social History Narrative   Not on file   Social Determinants of Health   Financial Resource Strain: Not on file  Food Insecurity: Not on file  Transportation Needs: Not on file  Physical Activity: Not on file  Stress: Not on file  Social Connections: Not on file    Current Medications:  Current Outpatient Medications:    allopurinol (ZYLOPRIM) 300 MG tablet, Take 1 tablet (300 mg total) by mouth daily., Disp: 30 tablet, Rfl: 3   amLODipine (NORVASC) 5 MG tablet, TAKE 1 TABLET (5 MG TOTAL) BY MOUTH DAILY., Disp: 30 tablet, Rfl: 2   apixaban (ELIQUIS) 5 MG TABS tablet, Take 1 tablet (5 mg total) by mouth 2 (two) times daily., Disp: 180 tablet, Rfl: 3   atorvastatin (LIPITOR) 80 MG tablet, Take 1 tablet (80 mg total) by mouth at bedtime., Disp: 90 tablet, Rfl: 3   Carboxymethylcellul-Glycerin (LUBRICATING EYE DROPS OP), Place 1 drop into both eyes daily as needed (dry eyes)., Disp: , Rfl:    Cholecalciferol (VITAMIN D3 PO), Take 1 capsule by mouth every evening. , Disp: , Rfl:    Coenzyme Q10 (COQ10) 100 MG CAPS, Take 100 mg by mouth every evening., Disp: , Rfl:    diphenhydrAMINE (BENADRYL) 25 mg capsule, Take 50 mg by mouth at bedtime., Disp: , Rfl:    famotidine (PEPCID) 20 MG tablet, Take 20 mg by mouth daily. , Disp: , Rfl:    furosemide (LASIX) 40 MG tablet, Take 1 tablet (40 mg total) by mouth every other day., Disp: 90 tablet, Rfl: 3   GLUCOSAMINE-CHONDROITIN PO, Take 1 tablet by mouth 2 (two) times daily., Disp: , Rfl:    insulin NPH-regular Human (70-30) 100 UNIT/ML injection, Inject 70 Units into the skin daily. 70 units In the morning and 50 units  in the evening, Disp: , Rfl:    KRILL OIL PO, Take 1 capsule by mouth daily., Disp: , Rfl:    losartan (COZAAR) 100 MG tablet, Take 100 mg by mouth every evening. , Disp: , Rfl:    meclizine (ANTIVERT) 12.5 MG tablet, Take 1 tablet (12.5 mg total) by mouth 3 (three) times daily as needed for dizziness., Disp: 30 tablet, Rfl: 0   Multiple Vitamins-Minerals (MULTIVITAMIN WITH MINERALS) tablet, Take 1 tablet by mouth  daily., Disp: , Rfl:    oxybutynin (DITROPAN-XL) 10 MG 24 hr tablet, Take 1 tablet (10 mg total) by mouth at bedtime., Disp: 30 tablet, Rfl: 3   potassium chloride (KLOR-CON) 10 MEQ tablet, Take 1 tablet (10 mEq total) by mouth every other day., Disp: 90 tablet, Rfl: 3   carvedilol (COREG) 12.5 MG tablet, Take 1 tablet (12.5 mg total) by mouth 2 (two) times daily with a meal., Disp: 180 tablet, Rfl: 3  Review of Systems: + cough Denies appetite changes, fevers, chills, fatigue, unexplained weight changes. Denies hearing loss, neck lumps or masses, mouth sores, ringing in ears or voice changes. Denies wheezing.  Denies shortness of breath. Denies chest pain or palpitations. Denies leg swelling. Denies abdominal distention, pain, blood in stools, constipation, diarrhea, nausea, vomiting, or early satiety. Denies pain with intercourse, dysuria, frequency, hematuria or incontinence. Denies hot flashes, pelvic pain, vaginal bleeding or vaginal discharge.   Denies joint pain, back pain or muscle pain/cramps. Denies itching, rash, or wounds. Denies dizziness, headaches, numbness or seizures. Denies swollen lymph nodes or glands, denies easy bruising or bleeding. Denies anxiety, depression, confusion, or decreased concentration.  Physical Exam: BP 134/79 (BP Location: Left Arm, Patient Position: Sitting)   Pulse (!) 117 Comment: pt states ECHO on 9/27, results w/cardiology next week  Temp 98.7 F (37.1 C) (Oral)   Resp 18   Ht _0  (1.626 m)   Wt (!) 345 lb (156.5 kg)   SpO2 96%    BMI 59.22 kg/m  General: Alert, oriented, no acute distress. HEENT: Normocephalic, atraumatic, sclera anicteric. Chest: Somewhat limited by body habitus but clear to auscultation bilaterally.   Cardiovascular: Mildly tachycardic, regular rhythm, no murmurs. Abdomen: Obese, soft, nontender.  Normoactive bowel sounds.  No masses or hepatosplenomegaly appreciated.  Subcutaneous edema in pannus. Extremities: Somewhat limited range of motion.  Warm, well perfused.  Significant lower extremity edema, compression socks on bilateral lower legs with wraps on the left lower leg. Lymphatics: No cervical, supraclavicular, or inguinal adenopathy. GU: Normal appearing external genitalia without erythema, excoriation, or lesions.  Speculum exam reveals moderately atrophic vaginal mucosa.  Cervix is somewhat difficult to visualize and rather flush with the vaginal apex, atrophic.  No bleeding or discharge.  Bimanual exam reveals no masses or nodularity, cervix flush with the vagina.  Uterine assessment is limited by body habitus.  Rectovaginal exam confirms findings, no parametrial nodularity.  Laboratory & Radiologic Studies: None new  Assessment & Plan: [name] is a 72 y.o. woman with clinical stage I grade 1 endometrioid endometrial cancer, non-operative candidate s/p D&C and IUD (progestin releasing) on 08/20/19. Complete clinical response to progestin therapy with negative repeat sampling in December, 2020.  Patient is NED on exam today although her exam is somewhat limited by body habitus.  She has significant atrophy related to pelvic radiation.  Discussed with her likely inability to sample the endometrium which means that we will be reliant on imaging to help with surveillance after her treatment.  Her last MRI was over the summer.  I have ordered 1 to be performed in December prior to her next 53-monthfollow-up visit with me.  Patient is overall doing well and has recovered significantly after  radiation.  We discussed signs and symptoms that would be concerning for cancer recurrence, and I stressed the importance of calling if she develops any of these.   28 minutes of total time was spent for this patient encounter, including preparation, face-to-face counseling with the patient  and coordination of care, and documentation of the encounter.  Jeral Pinch, MD  Division of Gynecologic Oncology  Department of Obstetrics and Gynecology  [facility]

## 2022-10-02 ENCOUNTER — Other Ambulatory Visit: Payer: Self-pay

## 2022-10-02 ENCOUNTER — Encounter: Payer: Self-pay | Admitting: Family

## 2022-10-02 ENCOUNTER — Telehealth: Payer: Self-pay

## 2022-10-02 ENCOUNTER — Telehealth (INDEPENDENT_AMBULATORY_CARE_PROVIDER_SITE_OTHER): Payer: Medicare Other | Admitting: Family

## 2022-10-02 ENCOUNTER — Encounter: Payer: Self-pay | Admitting: Adult Health

## 2022-10-02 VITALS — Temp 101.7°F

## 2022-10-02 DIAGNOSIS — U071 COVID-19: Secondary | ICD-10-CM | POA: Diagnosis not present

## 2022-10-02 DIAGNOSIS — J069 Acute upper respiratory infection, unspecified: Secondary | ICD-10-CM | POA: Diagnosis not present

## 2022-10-02 DIAGNOSIS — Z79899 Other long term (current) drug therapy: Secondary | ICD-10-CM

## 2022-10-02 DIAGNOSIS — I10 Essential (primary) hypertension: Secondary | ICD-10-CM

## 2022-10-02 MED ORDER — MOLNUPIRAVIR EUA 200MG CAPSULE: 4.0000 | ORAL_CAPSULE | Freq: Two times a day (BID) | ORAL | 0 refills | Status: AC

## 2022-10-02 MED ORDER — FUROSEMIDE 40 MG PO TABS: 60.0000 mg | ORAL_TABLET | ORAL | 3 refills | Status: DC

## 2022-10-02 NOTE — Progress Notes (Addendum)
Provider: Webb Silversmith Loui Massenburg FNP-C  Medina-Vargas, [name], NP  Patient Care Team: Nickola Major, NP as PCP - General (Internal Medicine)  Extended Emergency Contact Information Primary Emergency Contact: Roedl,Robert Address: [address]          [place], [place] [address] [place] Phone: [phone] Mobile Phone: [phone] Relation: Spouse Secondary Emergency Contact: Alvan Dame Address: [address]          [place], [place] [address] [place] Phone: [phone] Relation: Daughter  Code Status: Full Code  Goals of care: Advanced Directive information    07/30/2022    9:49 AM  Advanced Directives  Does Patient Have a Medical Advance Directive? No  Would patient like information on creating a medical advance directive? No - Patient declined     Chief Complaint  Patient presents with   Acute Visit    Patient tested positive this morning. She states she has some chills and fever started yesterday.    HPI:  Pt is a 72 y.o. female seen today for an acute visit for evaluation of COVId-19 positive test.states Husband tested positive for COVId-19 yesterday.she complains fever,chills,runny nose and sore throat x 2 days.she tested positive for COVID-19 this morning.She denies any cough,fatigue,body aches,chest tightness,chest pain,palpitation or shortness of breath.Had diarrhea yesterday but usually has chronic nausea and diarrhea sometimes. Denies any abdominal pain.      Past Medical History:  Diagnosis Date   Arthritis    Carpal tunnel syndrome    bilateral, had surgery on right hand   Diabetes mellitus without complication ([facility])    Diverticulitis    Endometrial cancer ([facility])    GERD (gastroesophageal reflux disease)    Gout    History of iron deficiency    Hyperlipidemia    Hypertension    Skin cancer    Past Surgical History:  Procedure Laterality Date   CARPAL TUNNEL RELEASE Right    COLON SURGERY      COLONOSCOPY     x2   COLOSTOMY     COLOSTOMY REVERSAL     DILATATION & CURETTAGE/HYSTEROSCOPY WITH MYOSURE N/A 07/17/2019   Procedure: DILATATION & CURETTAGE/HYSTEROSCOPY WITH MYOSURE;  Surgeon: Louretta Shorten, MD;  Location: [facility];  Service: Gynecology;  Laterality: N/A;   OOPHORECTOMY     unilateral   PILONIDAL CYST EXCISION     ROBOTIC ASSISTED TOTAL HYSTERECTOMY WITH BILATERAL SALPINGO OOPHERECTOMY N/A 08/20/2019   Procedure: DIAGNSOTIC LAPAROSCOPY, DILATION AND CURETTAGE, IUD PLACEMENT ;  Surgeon: Everitt Amber, MD;  Location: [facility];  Service: Gynecology;  Laterality: N/A;   SKIN CANCER EXCISION      Allergies  Allergen Reactions   Lisinopril Cough    Other reaction(s): cough, Not available   Actos [Pioglitazone]     Unknown reaction   Metformin Other (See Comments)   Metformin And Related     Gas     Outpatient Encounter Medications as of 10/02/2022  Medication Sig   allopurinol (ZYLOPRIM) 300 MG tablet Take 1 tablet (300 mg total) by mouth daily.   amLODipine (NORVASC) 5 MG tablet TAKE 1 TABLET (5 MG TOTAL) BY MOUTH DAILY.   apixaban (ELIQUIS) 5 MG TABS tablet Take 1 tablet (5 mg total) by mouth 2 (two) times daily.   atorvastatin (LIPITOR) 80 MG tablet Take 1 tablet (80 mg total) by mouth at bedtime.   Carboxymethylcellul-Glycerin (LUBRICATING EYE DROPS OP) Place 1 drop into both eyes daily as needed (dry eyes).  carvedilol (COREG) 12.5 MG tablet Take 1 tablet (12.5 mg total) by mouth 2 (two) times daily with a meal.   Cholecalciferol (VITAMIN D3 PO) Take 1 capsule by mouth every evening.    Coenzyme Q10 (COQ10) 100 MG CAPS Take 100 mg by mouth every evening.   diphenhydrAMINE (BENADRYL) 25 mg capsule Take 50 mg by mouth at bedtime.   famotidine (PEPCID) 20 MG tablet Take 20 mg by mouth daily.    GLUCOSAMINE-CHONDROITIN PO Take 1 tablet by mouth 2 (two) times daily.   insulin NPH-regular Human (70-30) 100 UNIT/ML injection Inject 70 Units into the skin  daily. 70 units In the morning and 50 units in the evening   KRILL OIL PO Take 1 capsule by mouth daily.   losartan (COZAAR) 100 MG tablet Take 100 mg by mouth every evening.    meclizine (ANTIVERT) 12.5 MG tablet Take 1 tablet (12.5 mg total) by mouth 3 (three) times daily as needed for dizziness.   Multiple Vitamins-Minerals (MULTIVITAMIN WITH MINERALS) tablet Take 1 tablet by mouth daily.   oxybutynin (DITROPAN-XL) 10 MG 24 hr tablet Take 1 tablet (10 mg total) by mouth at bedtime.   [DISCONTINUED] furosemide (LASIX) 40 MG tablet Take 1 tablet (40 mg total) by mouth every other day.   potassium chloride (KLOR-CON) 10 MEQ tablet Take 1 tablet (10 mEq total) by mouth every other day.   No facility-administered encounter medications on file as of 10/02/2022.    Review of Systems  Constitutional:  Positive for chills and fever. Negative for appetite change, fatigue and unexpected weight change.  HENT:  Positive for rhinorrhea and sore throat. Negative for congestion, ear discharge, ear pain, facial swelling, hearing loss, nosebleeds, postnasal drip, sinus pressure, sinus pain, sneezing, tinnitus and trouble swallowing.   Eyes:  Negative for pain, discharge, redness, itching and visual disturbance.  Respiratory:  Negative for cough, chest tightness, shortness of breath and wheezing.   Cardiovascular:  Negative for chest pain, palpitations and leg swelling.  Gastrointestinal:  Negative for abdominal distention, abdominal pain, blood in stool, constipation and vomiting.       Chronic nausea and diarrhea at times   Genitourinary:  Negative for difficulty urinating, dysuria, flank pain, frequency and urgency.  Musculoskeletal:  Negative for joint swelling, myalgias, neck pain and neck stiffness.  Skin:  Negative for color change, pallor, rash and wound.  Neurological:  Negative for dizziness, syncope, weakness, light-headedness and headaches.  Hematological:  Does not bruise/bleed easily.     Immunization History  Administered Date(s) Administered   Influenza, High Dose Seasonal PF 11/14/2015, 10/22/2017, 10/06/2018, 12/09/2019   Influenza,inj,Quad PF,6+ Mos 10/19/2016   PFIZER Comirnaty(Gray Top)Covid-19 Tri-Sucrose Vaccine 05/17/2021   PFIZER(Purple Top)SARS-COV-2 Vaccination 02/09/2020, 03/01/2020, 09/27/2020   Pneumococcal Conjugate-13 06/21/2014   Pneumococcal Polysaccharide-23 07/27/2015   Tdap 06/21/2014   Zoster, Live 08/16/2014   Pertinent  Health Maintenance Due  Topic Date Due   FOOT EXAM  Never done   OPHTHALMOLOGY EXAM  Never done   COLONOSCOPY (Pts 45-52yr Insurance coverage will need to be confirmed)  Never done   MAMMOGRAM  Never done   DEXA SCAN  Never done   INFLUENZA VACCINE  07/31/2022   HEMOGLOBIN A1C  01/23/2023      12/11/2019    2:21 PM 06/14/2020    2:35 PM 07/07/2022   11:22 AM 07/30/2022    9:49 AM 10/02/2022    1:02 PM  [place]in the past year?    0 0  Was there an injury with Fall?    0 0  Fall Risk Category Calculator    0 0  Fall Risk Category    Low Low  Patient Fall Risk Level Low fall risk Low fall risk Low fall risk  Low fall risk  Patient at Risk for Falls Due to    No Fall Risks   Fall risk Follow up    Falls evaluation completed Falls evaluation completed   Functional Status Survey:    Vitals:   10/02/22 1258  Temp: (!) 101.7 F (38.7 C)   There is no height or weight on file to calculate BMI. Physical Exam Constitutional:      General: She is not in acute distress.    Appearance: She is not ill-appearing.  Pulmonary:     Effort: Pulmonary effort is normal.  Neurological:     Mental Status: She is alert and oriented to person, place, and time.  Psychiatric:        Mood and Affect: Mood normal.        Behavior: Behavior normal.     Labs reviewed: Recent Labs    08/21/22 1049 09/10/22 1601 09/27/22 1205  NA 141 142 141  K 4.3 4.4 4.3  CL 107 103 105  CO2 '21 22 23  '$ GLUCOSE 170* 169* 163*   BUN '19 14 23  '$ CREATININE 1.13* 0.99 1.13*  CALCIUM 9.0 9.6 9.4   Recent Labs    07/23/22 0000  AST 18  ALT 18  ALBUMIN 3.6   Recent Labs    07/23/22 0000  WBC 6.0  HGB 9.8*  HCT 30*  PLT 332   Lab Results  Component Value Date   TSH 6.020 (H) 09/10/2022   Lab Results  Component Value Date   HGBA1C 5.6 07/23/2022   Lab Results  Component Value Date   CHOL 187 07/23/2022   HDL 31 (A) 07/23/2022   LDLCALC 124 07/23/2022   TRIG 177 (A) 07/23/2022    Significant Diagnostic Results in last 30 days:  ECHOCARDIOGRAM COMPLETE  Result Date: 09/28/2022    ECHOCARDIOGRAM REPORT   Patient Name:   [name]   Date of Exam: 09/26/2022 Medical Rec #:  [id number]     Height:       64.0 in Accession #:    [id number]    Weight:       348.0 lb Date of Birth:  September 18, 1950     BSA:          2.476 m Patient Age:    72 years      BP:           130/74 mmHg Patient Gender: F             HR:           100 bpm. Exam Location:  [facility] Procedure: 2D Echo, Cardiac Doppler, Color Doppler and Intracardiac            Opacification Agent Indications:    I48.91 Atrial fibrillation  History:        Patient has no prior history of Echocardiogram examinations.                 Arrythmias:Atrial Fibrillation; Risk Factors:Morbid obesity,                 Hypertension, Diabetes and Dyslipidemia.  Sonographer:    Coralyn Helling RDCS Referring Phys: [id number] PAULA V ROSS  Sonographer Comments: Technically difficult study due to  poor echo windows and patient is obese. Image acquisition challenging due to patient body habitus. IMPRESSIONS  1. Left ventricular ejection fraction, by estimation, is 50 to 55% with wide variability beat to beat. The left ventricle has low normal function. The left ventricle has no regional wall motion abnormalities. Left ventricular diastolic parameters are indeterminate.  2. Right ventricular systolic function is normal. The right ventricular size is normal. There is moderately elevated  pulmonary artery systolic pressure. The estimated right ventricular systolic pressure is 56.4 mmHg.  3. Left atrial size was mildly dilated.  4. The mitral valve is normal in structure. Trivial mitral valve regurgitation. No evidence of mitral stenosis.  5. The aortic valve is grossly normal. There is mild calcification of the aortic valve. Aortic valve regurgitation is trivial. No aortic stenosis is present.  6. The inferior vena cava is dilated in size with <50% respiratory variability, suggesting right atrial pressure of 15 mmHg. FINDINGS  Left Ventricle: Left ventricular ejection fraction, by estimation, is 50 to 55%. The left ventricle has low normal function. The left ventricle has no regional wall motion abnormalities. Definity contrast agent was given IV to delineate the left ventricular endocardial borders. The left ventricular internal cavity size was normal in size. There is no left ventricular hypertrophy. Left ventricular diastolic parameters are indeterminate. Right Ventricle: The right ventricular size is normal. No increase in right ventricular wall thickness. Right ventricular systolic function is normal. There is moderately elevated pulmonary artery systolic pressure. The tricuspid regurgitant velocity is 2.96 m/s, and with an assumed right atrial pressure of 15 mmHg, the estimated right ventricular systolic pressure is 33.2 mmHg. Left Atrium: Left atrial size was mildly dilated. Right Atrium: Right atrial size was normal in size. Pericardium: Trivial pericardial effusion is present. Mitral Valve: The mitral valve is normal in structure. Trivial mitral valve regurgitation. No evidence of mitral valve stenosis. Tricuspid Valve: The tricuspid valve is normal in structure. Tricuspid valve regurgitation is mild . No evidence of tricuspid stenosis. Aortic Valve: The aortic valve is grossly normal. There is mild calcification of the aortic valve. Aortic valve regurgitation is trivial. No aortic stenosis  is present. Pulmonic Valve: The pulmonic valve was normal in structure. Pulmonic valve regurgitation is trivial. No evidence of pulmonic stenosis. Aorta: The aortic root is normal in size and structure. Venous: The inferior vena cava is dilated in size with less than 50% respiratory variability, suggesting right atrial pressure of 15 mmHg. IAS/Shunts: The interatrial septum was not well visualized.  LEFT VENTRICLE PLAX 2D LVIDd:         4.90 cm   Diastology LVIDs:         3.40 cm   LV e' medial:    10.50 cm/s LV PW:         1.30 cm   LV E/e' medial:  15.1 LV IVS:        1.30 cm   LV e' lateral:   9.61 cm/s LVOT diam:     2.00 cm   LV E/e' lateral: 16.5 LV SV:         65 LV SV Index:   26 LVOT Area:     3.14 cm  RIGHT VENTRICLE            IVC RVSP:           38.0 mmHg  IVC diam: 1.70 cm LEFT ATRIUM             Index  RIGHT ATRIUM           Index LA diam:        4.80 cm 1.94 cm/m   RA Pressure: 3.00 mmHg LA Vol (A2C):   85.8 ml 34.66 ml/m  RA Area:     22.00 cm LA Vol (A4C):   96.6 ml 39.02 ml/m  RA Volume:   64.30 ml  25.97 ml/m LA Biplane Vol: 96.6 ml 39.02 ml/m  AORTIC VALVE LVOT Vmax:   108.08 cm/s LVOT Vmean:  72.640 cm/s LVOT VTI:    0.207 m  AORTA Ao Root diam: 3.00 cm Ao Asc diam:  3.20 cm MITRAL VALVE                TRICUSPID VALVE MV Area (PHT): 3.69 cm     TR Peak grad:   35.0 mmHg MV Decel Time: 206 msec     TR Vmax:        296.00 cm/s MV E velocity: 158.40 cm/s  Estimated RAP:  3.00 mmHg                             RVSP:           38.0 mmHg                              SHUNTS                             Systemic VTI:  0.21 m                             Systemic Diam: 2.00 cm Cherlynn Kaiser MD Electronically signed by Cherlynn Kaiser MD Signature Date/Time: 09/28/2022/12:20:52 PM    Final    LONG TERM MONITOR (3-14 DAYS)  Result Date: 09/26/2022 Patch Wear Time:  2 days and 23 hours (2023-09-14T15:37:37-0400 to 2023-09-17T15:34:31-0400) Impression: Rhythm:   Atrial fibrillation   Rates 45  to 174 bpm  Average HR 98 bpm   Rare PVC, PAC.   3 episodes of VT, longest lasting 11 beats with maximal rate 154 bpm  No diary entries  --------------------------------------------------------------------------- ---------------------------------------------------------------- 3 Ventricular Tachycardia runs occurred, the run with the fastest interval lasting 11 beats with a max rate of 154 bpm (avg 111 bpm); the run with the fastest interval was also the longest. Atrial Fibrillation occurred continuously (100% burden), ranging  from 45-174 bpm (avg of 98 bpm). Isolated VEs were rare (<1.0%), VE Couplets were rare (<1.0%), and no VE Triplets were present.    Assessment/Plan 1. Positive self-administered antigen test for COVID-19 Tested positive for COVID this morning husband also tested positive currently on treatment CR 1.13 with GFR 51 we will start on molnupiravir as below.  - molnupiravir EUA (LAGEVRIO) 200 mg CAPS capsule; Take 4 capsules (800 mg total) by mouth 2 (two) times daily for 5 days.  Dispense: 40 capsule; Refill: 0 Also advised to start on supportive supplement Vitamin C,Zinc and Vitamin D supplement instruction provided on AVS.patient also wrote instruction during visit  - OTC tylenol PRN for fever,chills or body aches.  Continue with facial covering and hand hygiene per CDC guidelines  - self Quarantine for 5 days  - Hold Q10, Atorvastatin for 5 days while on antiviral  - Reduce Eliquis from 5 mg to 2.5  mg one by mouth twice daily x 5 days then resume 5 mg tablet twice daily.  2. Upper respiratory tract infection due to COVID-19 virus Reports chills,fever,runny nose and congestion - OTC Mucinex 600 mg tablet twice daily x 14 days.   Start on antiviral as below. Increase fluid intake/soup/teas - molnupiravir EUA (LAGEVRIO) 200 mg CAPS capsule; Take 4 capsules (800 mg total) by mouth 2 (two) times daily for 5 days.  Dispense: 40 capsule; Refill: 0 - Notify provider or go to ED if  symptoms worsen or fail to improve.   Family/ staff Communication: Reviewed plan of care with patient verbalized understanding  Labs/tests ordered: None   Next Appointment: Return if symptoms worsen or fail to improve.  I connected with  Nicle Connole on 10/02/22 by a video enabled telemedicine application and verified that I am speaking with the correct person using two identifiers.   I discussed the limitations of evaluation and management by telemedicine. The patient expressed understanding and agreed to proceed.  Spent 11 minutes of non-face to face with patient  >50% time spent counseling; reviewing medical record; tests; labs; and developing future plan of care.   Sandrea Hughs, NP

## 2022-10-02 NOTE — Patient Instructions (Signed)
-   Take Zinc 50 mg tablet one by mouth daily for 14 days  - Take Vitamin C 500 mg tablet one by mouth twice daily x 14 days  - continue on vitamin D 2000 units daily  - Tylenol as needed for fever or body aches  - over the counter Mucinex as needed for cough  - increase your fruits intake in your diet  - increase your water intake to 6-8 glasses of water daily  - Hold Q10, Atorvastatin for 5 days while on antiviral  - Reduce Eliquis from 5 mg to 2.5 mg one by mouth twice daily x 5 days then resume 5 mg tablet twice daily.  - Notify provider or go to ED if you develop any chest tightness,chest pain or shortness of breath

## 2022-10-02 NOTE — Telephone Encounter (Signed)
The patient has been notified of the result and verbalized understanding.  All questions (if any) were answered.     

## 2022-10-02 NOTE — Telephone Encounter (Signed)
-----   Message from Fay Records, MD sent at 10/01/2022  8:30 AM EDT ----- I have reviewed echo    Overall LVEF is normal  RV function is normal I would recomm changing to 60 mg (1 1/2 tabs) 2x  per week instead of 40 mg  Follow up BMET and BNP in 4 wks

## 2022-10-04 ENCOUNTER — Encounter (HOSPITAL_BASED_OUTPATIENT_CLINIC_OR_DEPARTMENT_OTHER): Payer: Medicare Other | Admitting: Internal Medicine

## 2022-10-11 ENCOUNTER — Encounter (HOSPITAL_BASED_OUTPATIENT_CLINIC_OR_DEPARTMENT_OTHER): Payer: Medicare Other | Attending: Internal Medicine | Admitting: Internal Medicine

## 2022-10-11 ENCOUNTER — Encounter: Payer: Self-pay | Admitting: Adult Health

## 2022-10-11 ENCOUNTER — Encounter: Payer: Self-pay | Admitting: Internal Medicine

## 2022-10-11 DIAGNOSIS — L03115 Cellulitis of right lower limb: Secondary | ICD-10-CM | POA: Insufficient documentation

## 2022-10-11 DIAGNOSIS — I87333 Chronic venous hypertension (idiopathic) with ulcer and inflammation of bilateral lower extremity: Secondary | ICD-10-CM | POA: Insufficient documentation

## 2022-10-11 DIAGNOSIS — L03116 Cellulitis of left lower limb: Secondary | ICD-10-CM | POA: Diagnosis not present

## 2022-10-11 DIAGNOSIS — I4891 Unspecified atrial fibrillation: Secondary | ICD-10-CM | POA: Diagnosis not present

## 2022-10-11 DIAGNOSIS — E11622 Type 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: Secondary | ICD-10-CM | POA: Diagnosis not present

## 2022-10-11 DIAGNOSIS — L97822 Non-pressure chronic ulcer of other part of left lower leg with fat layer exposed: Secondary | ICD-10-CM | POA: Diagnosis not present

## 2022-10-11 DIAGNOSIS — M1990 Unspecified osteoarthritis, unspecified site: Secondary | ICD-10-CM | POA: Insufficient documentation

## 2022-10-11 DIAGNOSIS — L97812 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d: Secondary | ICD-10-CM | POA: Insufficient documentation

## 2022-10-11 DIAGNOSIS — I890 Lymphedema, not elsewhere classified: Secondary | ICD-10-CM | POA: Diagnosis not present

## 2022-10-11 MED ORDER — FUROSEMIDE 40 MG PO TABS: 80.0000 mg | ORAL_TABLET | Freq: Two times a day (BID) | ORAL | 3 refills | Status: DC

## 2022-10-11 MED ORDER — POTASSIUM CHLORIDE ER 10 MEQ PO TBCR: 10.0000 meq | EXTENDED_RELEASE_TABLET | Freq: Two times a day (BID) | ORAL | 3 refills | Status: DC

## 2022-10-11 NOTE — Progress Notes (Signed)
AYAANA, BIONDO ([id number]) [id number] Page 1 of 11 Visit Report for 10/11/2022 Arrival Information Details Patient Name: Date of Service: [place], [name] 10/11/2022 11:15 A M Medical Record Number: [id number] Patient Account Number: [id number] Date of Birth/Sex: Treating RN: [birth year] (72 y.o. [name], [name] Primary Care Fusae Florio: [name], [name] Other Clinician: Referring Peta Peachey: Treating Laurier Jasperson/Extender: [name] [name], [name] Weeks in Treatment: 12 Visit Information History Since Last Visit Added or deleted any medications: No Patient Arrived: Wheel Chair Any new allergies or adverse reactions: No Arrival Time: 11:08 Had a fall or experienced change in No Accompanied By: self activities of daily living that may affect Transfer Assistance: None risk of falls: Patient Identification Verified: Yes Signs or symptoms of abuse/neglect since last visito No Secondary Verification Process Completed: Yes Hospitalized since last visit: No Patient Requires Transmission-Based No Implantable device outside of the clinic excluding No Precautions: cellular tissue based products placed in the center Patient Has Alerts: Yes since last visit: Patient Alerts: Patient on Blood Thinner Has Dressing in Place as Prescribed: No 08/06/2022 TBI L 0.59 R0.65 Has Compression in Place as Prescribed: No 08/06/2022 ABI R 0.86 Pain Present Now: No L0.82 Notes per patient had COVID last week and missed appt time. Per patient remove wraps on right leg. Patient notes wearing juxtalites to both legs though no compression on today with both legs open and weeping. Electronic Signature(s) Signed: 10/11/2022 4:52:38 PM By: [name] RN, BSN Entered By: [name] on 10/11/2022 11:10:07 -------------------------------------------------------------------------------- Compression Therapy Details Patient Name: Date of Service: [place], [name] RIA N  10/11/2022 11:15 A M Medical Record Number: [id number] Patient Account Number: [id number] Date of Birth/Sex: Treating RN: 1950-07-22 (72 y.o. [name] Primary Care Aaleeyah Bias: [name], [name] Other Clinician: Referring Nysia Dell: Treating Vedanth Sirico/Extender: [name] [name], [name] Weeks in Treatment: 12 Compression Therapy Performed for Wound Assessment: Wound #1RR Left,Circumferential Lower Leg Performed By: Clinician [name], RN Compression Type: Four Layer Post Procedure Diagnosis Same as Pre-procedure Electronic Signature(s) Signed: 10/11/2022 4:52:38 PM By: [name] RN, BSN Entered By: [name] on 10/11/2022 11:42:38 [name] ([id number]) [id number] Page 2 of 11 -------------------------------------------------------------------------------- Compression Therapy Details Patient Name: Date of Service: TRAVONNA, SWINDLE 10/11/2022 11:15 A M Medical Record Number: [id number] Patient Account Number: [id number] Date of Birth/Sex: Treating RN: November 10, 1950 (72 y.o. [name] Primary Care Kearra Calkin: [name], [name] Other Clinician: Referring Kateline Kinkade: Treating Jalen Oberry/Extender: [name] [name], [name] Weeks in Treatment: 12 Compression Therapy Performed for Wound Assessment: Wound #2 Right,Circumferential Lower Leg Performed By: Clinician [name], RN Compression Type: Four Layer Post Procedure Diagnosis Same as Pre-procedure Electronic Signature(s) Signed: 10/11/2022 4:52:38 PM By: [name] RN, BSN Entered By: [name] on 10/11/2022 11:42:38 -------------------------------------------------------------------------------- Encounter Discharge Information Details Patient Name: Date of Service: [place], [name] RIA N 10/11/2022 11:15 A M Medical Record Number: [id number] Patient Account Number: [id number] Date of Birth/Sex: Treating RN: Nov 04, 1950 (72 y.o. [name] Primary Care Clayborn Milnes: [name], [name] Other Clinician: Referring Saheed Carrington: Treating Katelee Schupp/Extender: [name] [name], [name] Weeks in Treatment: 12 Encounter Discharge Information Items Discharge Condition: Stable Ambulatory Status: Wheelchair Discharge Destination: Home Transportation: Private Auto Accompanied By: self Schedule Follow-up Appointment: Yes Clinical Summary of Care: Electronic Signature(s) Signed: 10/11/2022 4:52:38 PM By: [name] RN, BSN Entered By: [name] on 10/11/2022 11:46:47 -------------------------------------------------------------------------------- Lower  Extremity Assessment Details Patient Name: Date of Service: ULLA, MCKIERNAN 10/11/2022 11:15 A M Medical Record Number: [id number] Patient Account Number: [id number] Date of Birth/Sex: Treating RN: [birth year] (72 y.o. [name] Primary Care Gracin Soohoo: [name], [name] Other Clinician: Referring Arren Laminack: Treating Fadi Menter/Extender: [name] [name], [name] Weeks in Treatment: 12 Edema Assessment Assessed: [Left: Yes] [Right: Yes] B[LeftVEE, BAHE ([id number])] [Right: [id number] Page 3 of 11] Edema: [Left: Yes] [Right: Yes] Calf Left: Right: Point of Measurement: 39 cm From Medial Instep 52 cm 53 cm Ankle Left: Right: Point of Measurement: 10 cm From Medial Instep 39.5 cm 29.5 cm Vascular Assessment Pulses: Dorsalis Pedis Palpable: [Left:Yes] [Right:Yes] Electronic Signature(s) Signed: 10/11/2022 4:52:38 PM By: [name] RN, BSN Entered By: [name] on 10/11/2022 11:13:07 -------------------------------------------------------------------------------- Multi Wound Chart Details Patient Name: Date of Service: [name], Gaylyn Cheers RIA N 10/11/2022 11:15 A M Medical Record Number: [id number] Patient Account Number: [id number] Date of Birth/Sex: Treating RN: [birth year] (72 y.o. [name] Primary Care Cyntia Staley: [name], [name] Other Clinician: Referring Kentravious Lipford: Treating Zyan Mirkin/Extender: [name] [name], [name] Weeks in Treatment: 12 Vital Signs Height(in): 64 Pulse(bpm): 76 Weight(lbs): 325 Blood Pressure(mmHg): 136/84 Body Mass Index(BMI): 55.8 Temperature(F): 99.1 Respiratory Rate(breaths/min): 24 [1RR:Photos:] [N/A:N/A] Left, Circumferential Lower Leg Right, Circumferential Lower Leg N/A Wound Location: Gradually Appeared Gradually Appeared N/A Wounding Event: Lymphedema Lymphedema N/A Primary Etiology: Anemia, Arrhythmia, Hypertension, Anemia, Arrhythmia, Hypertension, N/A Comorbid History: Type II Diabetes, Gout, Osteoarthritis, Type II Diabetes, Gout, Osteoarthritis, Received Radiation Received Radiation 07/05/2022 07/05/2022 N/A Date Acquired: 12 12 N/A Weeks of Treatment: Open Open N/A Wound StatusMARILYNNE, DUPUIS ([id number]) [id number] Page 4 of 11 Yes No N/A Wound Recurrence: Yes Yes N/A Clustered Wound: 5 10 N/A Clustered Quantity: 15x24x0.1 16x53x0.1 N/A Measurements L x W x D (cm) 282.743 666.018 N/A A (cm) : rea 28.274 66.602 N/A Volume (cm) : 28.90% -71.70% N/A % Reduction in Area: 28.90% -71.70% N/A % Reduction in Volume: Full Thickness Without Exposed Full Thickness Without Exposed N/A Classification: Support Structures Support Structures Large Large N/A Exudate Amount: Serous Serous N/A Exudate Type: Physiological scientist N/A Exudate Color: Distinct, outline attached Distinct, outline attached N/A Wound Margin: Large (67-100%) None Present (0%) N/A Granulation Amount: Red N/A N/A Granulation Quality: None Present (0%) None Present (0%) N/A Necrotic Amount: Fat Layer (Subcutaneous Tissue): Yes Fat Layer (Subcutaneous Tissue): Yes N/A Exposed Structures: Fascia: No Fascia: No Tendon: No Tendon: No Muscle: No Muscle: No Joint: No Joint: No Bone: No Bone:  No None None N/A Epithelialization: Excoriation: No Excoriation: No N/A Periwound Skin Texture: Induration: No Induration: No Callus: No Callus: No Crepitus: No Crepitus: No Rash: No Rash: No Scarring: No Scarring: No Maceration: Yes Maceration: Yes N/A Periwound Skin Moisture: Dry/Scaly: No Dry/Scaly: No Hemosiderin Staining: Yes Hemosiderin Staining: Yes N/A Periwound Skin Color: Atrophie Blanche: No Atrophie Blanche: No Cyanosis: No Cyanosis: No Ecchymosis: No Ecchymosis: No Erythema: No Erythema: No Mottled: No Mottled: No Pallor: No Pallor: No Rubor: No Rubor: No cobblestone appearance. cobblestone appearance N/A Assessment Notes: Compression Therapy Compression Therapy N/A Procedures Performed: Treatment Notes Wound #1RR (Lower Leg) Wound Laterality: Left, Circumferential Cleanser Peri-Wound Care Sween Lotion (Moisturizing lotion) Discharge Instruction: Apply moisturizing lotion as directed Topical Primary Dressing KerraCel Ag Gelling Fiber Dressing, 4x5 in (silver alginate) Discharge Instruction: Apply silver alginate to wound bed as instructed Secondary Dressing ABD Pad, 8x10 Discharge Instruction: Apply over primary dressing as directed.  Woven Gauze Sponge, Non-Sterile 4x4 in Discharge Instruction: Apply over primary dressing as directed. Zetuvit Plus 4x8 in Discharge Instruction: Apply over primary dressing as directed. Secured With Compression Wrap FourPress (4 layer compression wrap) Discharge Instruction: Apply four layer compression as directed. use first layer unna boot to upper portion of lower legs to secure wrap in place. Compression Stockings Add-Ons Wound #2 (Lower Leg) Wound Laterality: Right, Circumferential Cleanser AHONESTY, WOODFIN ([id number]) [id number] Page 5 of 11 Peri-Wound Care Sween Lotion (Moisturizing lotion) Discharge Instruction: Apply moisturizing lotion as directed Topical Primary  Dressing KerraCel Ag Gelling Fiber Dressing, 4x5 in (silver alginate) Discharge Instruction: Apply silver alginate to wound bed as instructed Secondary Dressing ABD Pad, 8x10 Discharge Instruction: Apply over primary dressing as directed. Woven Gauze Sponge, Non-Sterile 4x4 in Discharge Instruction: Apply over primary dressing as directed. Zetuvit Plus 4x8 in Discharge Instruction: Apply over primary dressing as directed. Secured With Compression Wrap FourPress (4 layer compression wrap) Discharge Instruction: Apply four layer compression as directed. use first layer unna boot to upper portion of lower legs to secure wrap in place. Compression Stockings Add-Ons Electronic Signature(s) Signed: 10/11/2022 1:20:08 PM By: [name] DO Signed: 10/11/2022 4:52:38 PM By: [name] RN, BSN Entered By: [name] on 10/11/2022 12:14:12 -------------------------------------------------------------------------------- Multi-Disciplinary Care Plan Details Patient Name: Date of Service: [place], [name] RIA N 10/11/2022 11:15 A M Medical Record Number: [id number] Patient Account Number: [id number] Date of Birth/Sex: Treating RN: 1950-05-18 (72 y.o. [name] Primary Care Ananda Sitzer: [name], [name] Other Clinician: Referring Teodora Baumgarten: Treating Yakir Wenke/Extender: [name] [name], [name] Weeks in Treatment: 12 Active Inactive Pain, Acute or Chronic Nursing Diagnoses: Pain, acute or chronic: actual or potential Potential alteration in comfort, pain Goals: Patient will verbalize adequate pain control and receive pain control interventions during procedures as needed Date Initiated: 07/19/2022 Target Resolution Date: 11/30/2022 Goal Status: Active Patient/caregiver will verbalize comfort level met Date Initiated: 07/19/2022 Date Inactivated: 08/06/2022 Target Resolution Date: 08/06/2022 Goal Status: Met Interventions: Encourage patient to take pain  medications as prescribed Provide education on pain management Reposition patient for comfort Treatment Activities: MADICYN, MESINA ([id number]) [id number] Page 6 of 11 Administer pain control measures as ordered : 07/19/2022 Notes: Venous Leg Ulcer Nursing Diagnoses: Actual venous Insuffiency (use after diagnosis is confirmed) Potential for venous Insuffiency (use before diagnosis confirmed) Goals: Non-invasive venous studies are completed as ordered Date Initiated: 07/19/2022 Target Resolution Date: 10/26/2022 Goal Status: Active Interventions: Assess peripheral edema status every visit. Compression as ordered Provide education on venous insufficiency Treatment Activities: Non-invasive vascular studies : 07/19/2022 Notes: 10/11/2022: patient not maintaining/controlling the edema. Patient had COVID last week and missed appt. Patient notes wearing Juxtalites HD daily. Wound/Skin Impairment Nursing Diagnoses: Knowledge deficit related to ulceration/compromised skin integrity Goals: Patient/caregiver will verbalize understanding of skin care regimen Date Initiated: 07/19/2022 Target Resolution Date: 10/26/2022 Goal Status: Active Interventions: Assess patient/caregiver ability to perform ulcer/skin care regimen upon admission and as needed Assess ulceration(s) every visit Provide education on ulcer and skin care Treatment Activities: Skin care regimen initiated : 07/19/2022 Topical wound management initiated : 07/19/2022 Notes: Electronic Signature(s) Signed: 10/11/2022 4:52:38 PM By: [name] RN, BSN Entered By: [name] on 10/11/2022 11:24:15 -------------------------------------------------------------------------------- Pain Assessment Details Patient Name: Date of Service: [name] 10/11/2022 11:15 A M Medical Record Number: [id number] Patient Account Number: [id number] Date of Birth/Sex: Treating RN: 05/09/1950 (72 y.o. [name] Primary Care Dare Spillman: [name], [name] Other  Clinician: Referring Shyne Lehrke: Treating Iwalani Templeton/Extender: [name] [name], [name] Weeks in Treatment: 12 Active Problems Location of Pain Severity and Description of Pain Patient Has Paino No Site Locations Rate the pain. DEETTA, SIEGMANN ([id number]) [id number] Page 7 of 11 Rate the pain. Current Pain Level: 0 Pain Management and Medication Current Pain Management: Medication: No Cold Application: No Rest: No Massage: No Activity: No T.E.N.S.: No Heat Application: No Leg drop or elevation: No Is the Current Pain Management Adequate: Adequate How does your wound impact your activities of daily livingo Sleep: No Bathing: No Appetite: No Relationship With Others: No Bladder Continence: No Emotions: No Bowel Continence: No Work: No Toileting: No Drive: No Dressing: No Hobbies: No Engineer, maintenance) Signed: 10/11/2022 4:52:38 PM By: [name] RN, BSN Entered By: [name] on 10/11/2022 11:10:16 -------------------------------------------------------------------------------- Patient/Caregiver Education Details Patient Name: Date of Service: Aleda Grana 10/12/2023andnbsp11:15 A M Medical Record Number: [id number] Patient Account Number: [id number] Date of Birth/Gender: Treating RN: 1950/08/31 (72 y.o. [name] Primary Care Physician: [name], [name] Other Clinician: Referring Physician: Treating Physician/Extender: [name] [name], [name] Weeks in Treatment: 12 Education Assessment Education Provided To: Patient Education Topics Provided Wound/Skin Impairment: Handouts: Skin Care Do's and Dont's Methods: Explain/Verbal Responses: Reinforcements needed Electronic Signature(s) Signed: 10/11/2022 4:52:38 PM By: [name] RN, BSN Entered By: [name] on 10/11/2022 11:29:39 [name] ([id number])  [id number] Page 8 of 11 -------------------------------------------------------------------------------- Wound Assessment Details Patient Name: Date of Service: [place], CAMARENA 10/11/2022 11:15 A M Medical Record Number: [id number] Patient Account Number: [id number] Date of Birth/Sex: Treating RN: 25-Jan-1950 (72 y.o. [name], [name] Primary Care Yerick Eggebrecht: [name], [name] Other Clinician: Referring Gladine Plude: Treating Telsa Dillavou/Extender: [name] [name], [name] Weeks in Treatment: 12 Wound Status Wound Number: 1RR Primary Lymphedema Etiology: Wound Location: Left, Circumferential Lower Leg Wound Open Wounding Event: Gradually Appeared Status: Date Acquired: 07/05/2022 Comorbid Anemia, Arrhythmia, Hypertension, Type II Diabetes, Gout, Weeks Of Treatment: 12 History: Osteoarthritis, Received Radiation Clustered Wound: Yes Photos Wound Measurements Length: (cm) Width: (cm) Depth: (cm) Clustered Quantity: Area: (cm) Volume: (cm) 15 % Reduction in Area: 28.9% 24 % Reduction in Volume: 28.9% 0.1 Epithelialization: None 5 Tunneling: No 282.743 Undermining: No 28.274 Wound Description Classification: Full Thickness Without Exposed Sup Wound Margin: Distinct, outline attached Exudate Amount: Large Exudate Type: Serous Exudate Color: amber port Structures Foul Odor After Cleansing: No Slough/Fibrino No Wound Bed Granulation Amount: Large (67-100%) Exposed Structure Granulation Quality: Red Fascia Exposed: No Necrotic Amount: None Present (0%) Fat Layer (Subcutaneous Tissue) Exposed: Yes Tendon Exposed: No Muscle Exposed: No Joint Exposed: No Bone Exposed: No Periwound Skin Texture Texture Color No Abnormalities Noted: No No Abnormalities Noted: No Callus: No Atrophie Blanche: No Crepitus: No Cyanosis: No Excoriation: No Ecchymosis: No Induration: No Erythema: No Rash: No Hemosiderin Staining: Yes Scarring:  No Mottled: No Pallor: No Moisture Rubor: No No Abnormalities Noted: No Dry / Scaly: No SEERAT, PEADEN ([id number]) [id number] Page 9 of 11 Maceration: Yes Assessment Notes cobblestone appearance. Treatment Notes Wound #1RR (Lower Leg) Wound Laterality: Left, Circumferential Cleanser Peri-Wound Care Sween Lotion (Moisturizing lotion) Discharge Instruction: Apply moisturizing lotion as directed Topical Primary Dressing KerraCel Ag Gelling Fiber Dressing, 4x5 in (silver alginate) Discharge Instruction: Apply silver alginate to wound bed as instructed Secondary Dressing ABD Pad, 8x10 Discharge Instruction: Apply over primary dressing as directed. Woven Gauze Sponge, Non-Sterile 4x4 in Discharge Instruction: Apply over  primary dressing as directed. Zetuvit Plus 4x8 in Discharge Instruction: Apply over primary dressing as directed. Secured With Compression Wrap FourPress (4 layer compression wrap) Discharge Instruction: Apply four layer compression as directed. use first layer unna boot to upper portion of lower legs to secure wrap in place. Compression Stockings Add-Ons Electronic Signature(s) Signed: 10/11/2022 4:52:38 PM By: [name] RN, BSN Entered By: [name] on 10/11/2022 11:17:32 -------------------------------------------------------------------------------- Wound Assessment Details Patient Name: Date of Service: [place], [name] RIA N 10/11/2022 11:15 A M Medical Record Number: [id number] Patient Account Number: [id number] Date of Birth/Sex: Treating RN: 1950-09-27 (72 y.o. [name] Primary Care Kemyah Buser: [name], [name] Other Clinician: Referring Kelsie Zaborowski: Treating Fawne Hughley/Extender: [name] [name], [name] Weeks in Treatment: 12 Wound Status Wound Number: 2 Primary Lymphedema Etiology: Wound Location: Right, Circumferential Lower Leg Wound Open Wounding Event: Gradually Appeared Status: Date  Acquired: 07/05/2022 Comorbid Anemia, Arrhythmia, Hypertension, Type II Diabetes, Gout, Weeks Of Treatment: 12 History: Osteoarthritis, Received Radiation Clustered Wound: Yes Photos Tashua, Jinny Sanders ([id number]) [id number] Page 10 of 11 Wound Measurements Length: (cm) 16 Width: (cm) 53 Depth: (cm) 0.1 Clustered Quantity: 10 Area: (cm) 666.018 Volume: (cm) 66.602 % Reduction in Area: -71.7% % Reduction in Volume: -71.7% Epithelialization: None Tunneling: No Undermining: No Wound Description Classification: Full Thickness Without Exposed Sup Wound Margin: Distinct, outline attached Exudate Amount: Large Exudate Type: Serous Exudate Color: amber port Structures Foul Odor After Cleansing: No Slough/Fibrino No Wound Bed Granulation Amount: None Present (0%) Exposed Structure Necrotic Amount: None Present (0%) Fascia Exposed: No Fat Layer (Subcutaneous Tissue) Exposed: Yes Tendon Exposed: No Muscle Exposed: No Joint Exposed: No Bone Exposed: No Periwound Skin Texture Texture Color No Abnormalities Noted: No No Abnormalities Noted: No Callus: No Atrophie Blanche: No Crepitus: No Cyanosis: No Excoriation: No Ecchymosis: No Induration: No Erythema: No Rash: No Hemosiderin Staining: Yes Scarring: No Mottled: No Pallor: No Moisture Rubor: No No Abnormalities Noted: No Dry / Scaly: No Maceration: Yes Assessment Notes cobblestone appearance Treatment Notes Wound #2 (Lower Leg) Wound Laterality: Right, Circumferential Cleanser Peri-Wound Care Sween Lotion (Moisturizing lotion) Discharge Instruction: Apply moisturizing lotion as directed Topical Primary Dressing KerraCel Ag Gelling Fiber Dressing, 4x5 in (silver alginate) Discharge Instruction: Apply silver alginate to wound bed as instructed Secondary Dressing ABD Pad, 8x10 Discharge Instruction: Apply over primary dressing as directed. Woven Gauze Sponge, Non-Sterile 4x4 in [place],  Jinny Sanders ([id number]) [id number] Page 11 of 11 Discharge Instruction: Apply over primary dressing as directed. Zetuvit Plus 4x8 in Discharge Instruction: Apply over primary dressing as directed. Secured With Compression Wrap FourPress (4 layer compression wrap) Discharge Instruction: Apply four layer compression as directed. use first layer unna boot to upper portion of lower legs to secure wrap in place. Compression Stockings Add-Ons Electronic Signature(s) Signed: 10/11/2022 4:52:38 PM By: [name] RN, BSN Entered By: [name] on 10/11/2022 11:18:08 -------------------------------------------------------------------------------- Vitals Details Patient Name: Date of Service: [name], [place] 10/11/2022 11:15 A M Medical Record Number: [id number] Patient Account Number: [id number] Date of Birth/Sex: Treating RN: 15-Dec-1950 (73 y.o. [name], [name] Primary Care Amand Lemoine: [name], [name] Other Clinician: Referring Elloise Roark: Treating Makenleigh Crownover/Extender: [name] [name], [name] Weeks in Treatment: 12 Vital Signs Time Taken: 11:18 Temperature (F): 99.1 Height (in): 64 Pulse (bpm): 76 Weight (lbs): 325 Respiratory Rate (breaths/min): 24 Body Mass Index (BMI): 55.8 Blood Pressure (mmHg): 136/84 Reference Range: 80 - 120 mg / dl Electronic Signature(s) Signed: 10/11/2022 4:52:38 PM  By: [name] RN, BSN Entered By: [name] on 10/11/2022 11:18:17

## 2022-10-11 NOTE — Telephone Encounter (Signed)
Increase  Lasix to 80 bid   Keep with same lab draw plan

## 2022-10-11 NOTE — Progress Notes (Signed)
SUMAYYA, MUHA ([id number]) [id number] Page 1 of 9 Visit Report for 10/11/2022 Chief Complaint Document Details Patient Name: Date of Service: [place], [name] 10/11/2022 11:15 A M Medical Record Number: [id number] Patient Account Number: [id number] Date of Birth/Sex: Treating RN: 26-Feb-1950 (72 y.o. [name] Primary Care Provider: [facility]-V A Jeri [place], [name] Other Clinician: Referring Provider: Treating Provider/Extender: [name] [name], [name] Weeks in Treatment: 12 Information Obtained from: Patient Chief Complaint 07/19/2022; bilateral lower extremity wounds Electronic Signature(s) Signed: 10/11/2022 1:20:08 PM By: [name] DO Entered By: [name] on 10/11/2022 12:14:29 -------------------------------------------------------------------------------- HPI Details Patient Name: Date of Service: [name], [place] RIA N 10/11/2022 11:15 A M Medical Record Number: [id number] Patient Account Number: [id number] Date of Birth/Sex: Treating RN: 1950-02-28 (72 y.o. [name] Primary Care Provider: [name], [name] Other Clinician: Referring Provider: Treating Provider/Extender: [name] [name], [name] Weeks in Treatment: 12 History of Present Illness HPI Description: Admission 07/19/2022 Ms. Briasia Flinders Is a 72 year old female with a past medical history of endometrial cancer, lymphedema, insulin-dependent type 2 diabetes that presents to the clinic for a 4-week history of weeping to her lower extremities bilaterally. She visited the ED for this issue on 07/07/2022 and she was prescribed doxycycline For bilateral lower extremity cellulitis. She has completed this course. She currently denies signs of infection. She does not have compression stockings. She states she recently gained weight over the past couple months. She has never had wounds before. 7/28; patient presents for follow-up. We have been  using silver alginate under Kerlix/Coban. She is scheduled to get her ABIs with TBI's on 8/7. She states that the wraps slid down to her mid shin. She denies signs of infection. 8/7; patient presents for follow-up. We have been using silver alginate under Kerlix/Coban. She had arterial studies done that showed an ABI of 0.86 on the right and 0.82 on the left with multiphasic waveforms throughout the lower extremities. 8/14; patient presents for follow-up. We have been using silver alginate under 3 layer compression. She tolerated the wrap well. She has no issues or complaints today. 8/21; patient presents for follow-up. We have been using silver alginate under 3 layer compression. She has no issues or complaints today. She has her juxta lite compressions with her. 8/28; patient presents for follow-up. We have been using silver alginate under 3 layer compression. She has no issues or complaints today. 9/5; patient presents for follow-up. We have been using silver alginate under 4 layer compression to both extremities bilaterally. She has her juxta lite compression with her today. 9/12; patient presents for follow-up. We have been using silver alginate under 4-layer compression to the right leg. She was supposed to wear her juxta lite compression to the left lower extremity. She states she could not get it on so she has not been wearing it. She also has not been taking her Lasix. 9/19; patient presents for follow-up. We have been using silver alginate under 4-layer compression to her lower extremities bilaterally. She has restarted taking her Lasix on a daily basis. 9/26; patient presents for follow-up. We have been using silver alginate under 4-layer compression to the lower extremities bilaterally. Her left lower extremity wounds have healed. She has juxta lite compressions. DEIRA, SHIMER ([id number]) [id number] Page 2 of 9 10/12; patient presents for follow-up. Patient  had COVID last week and missed her appointment. She states she took her compression wrap off on the right lower extremity.  Her wounds were healed on the left lower extremity at last clinic visit but these have since reopened. She has juxta lite compression and states she has been wearing them. Unfortunately she has multiple scattered wounds now to her right lower extremity as well. No signs of infection on exam. We have offered to order her lymphedema pumps however she states she wants to use her husband's. She has not been doing this. Her diuretic is 60 mg of Lasix twice weekly. She has not been contacted for her venous reflux studies. Electronic Signature(s) Signed: 10/11/2022 1:20:08 PM By: [name] DO Entered By: [name] on 10/11/2022 12:16:28 -------------------------------------------------------------------------------- Physical Exam Details Patient Name: Date of Service: [name], [place] RIA N 10/11/2022 11:15 A M Medical Record Number: [id number] Patient Account Number: [id number] Date of Birth/Sex: Treating RN: 1950/09/24 (72 y.o. [name] Primary Care Provider: [name], [name] Other Clinician: Referring Provider: Treating Provider/Extender: [name] [name], [name] Weeks in Treatment: 12 Constitutional respirations regular, non-labored and within target range for patient.. Cardiovascular 2+ dorsalis pedis/posterior tibialis pulses. Psychiatric pleasant and cooperative. Notes T the lower extremities there are multiple open wounds limited to skin breakdown with weeping. No signs of surrounding infection. 2+ pitting edema to the knees o bilaterally. Electronic Signature(s) Signed: 10/11/2022 1:20:08 PM By: [name] DO Entered By: [name] on 10/11/2022 12:18:02 -------------------------------------------------------------------------------- Physician Orders Details Patient Name: Date of Service: [place], [name] RIA N  10/11/2022 11:15 A M Medical Record Number: [id number] Patient Account Number: [id number] Date of Birth/Sex: Treating RN: 02-18-1950 (72 y.o. [name] Primary Care Provider: [name], [name] Other Clinician: Referring Provider: Treating Provider/Extender: [name] [name], [name] Weeks in Treatment: 12 Verbal / Phone Orders: No Diagnosis Coding ICD-10 Coding Code Description [phone] 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d L97.822 Non-pressure chronic ulcer of other part of left lower leg with fat layer exposed I89.0 Lymphedema, not elsewhere classified I87.333 Chronic venous hypertension (idiopathic) with ulcer and inflammation of bilateral lower extremity E11.622 Type 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 CHERLY, ERNO ([id number]) [id number] Page 3 of 9 Follow-up Appointments ppointment in 1 week. - Dr. Heber [place] and Tammi Klippel, Room 8 Return A ppointment in 2 weeks. - Dr. Heber [place] and [place], Room 8 Return A Other: - continue use the lasix as prescribed. ***ENSURE TO LET CARDIOLOGIST KNOW ABOUT BLISTERS AND SWELLING IN BOTH LEGS.*** [phone] call [facility] to schedule venous studies. Bathing/ Shower/ Hygiene May shower with protection but do not get wound dressing(s) wet. Edema Control - Lymphedema / SCD / Other Lymphedema Pumps. Use Lymphedema pumps on leg(s) 2-3 times a day for 45-60 minutes. If wearing any wraps or hose, do not remove them. Continue exercising as instructed. - 2-3 times a day throughout the day. Elevate legs to the level of the heart or above for 30 minutes daily and/or when sitting, a frequency of: - 3-4 times a day throughout the day. Avoid standing for long periods of time. Exercise regularly Wound Treatment Wound #1RR - Lower Leg Wound Laterality: Left, Circumferential Peri-Wound Care: Sween Lotion  (Moisturizing lotion) 1 x Per Week/30 Days Discharge Instructions: Apply moisturizing lotion as directed Prim Dressing: KerraCel Ag Gelling Fiber Dressing, 4x5 in (silver alginate) 1 x Per Week/30 Days ary Discharge Instructions: Apply silver alginate to wound bed as instructed Secondary Dressing: ABD Pad, 8x10 1 x Per  Week/30 Days Discharge Instructions: Apply over primary dressing as directed. Secondary Dressing: Woven Gauze Sponge, Non-Sterile 4x4 in 1 x Per Week/30 Days Discharge Instructions: Apply over primary dressing as directed. Secondary Dressing: Zetuvit Plus 4x8 in 1 x Per Week/30 Days Discharge Instructions: Apply over primary dressing as directed. Compression Wrap: FourPress (4 layer compression wrap) 1 x Per Week/30 Days Discharge Instructions: Apply four layer compression as directed. use first layer unna boot to upper portion of lower legs to secure wrap in place. Wound #2 - Lower Leg Wound Laterality: Right, Circumferential Peri-Wound Care: Sween Lotion (Moisturizing lotion) 1 x Per Week/30 Days Discharge Instructions: Apply moisturizing lotion as directed Prim Dressing: KerraCel Ag Gelling Fiber Dressing, 4x5 in (silver alginate) 1 x Per Week/30 Days ary Discharge Instructions: Apply silver alginate to wound bed as instructed Secondary Dressing: ABD Pad, 8x10 1 x Per Week/30 Days Discharge Instructions: Apply over primary dressing as directed. Secondary Dressing: Woven Gauze Sponge, Non-Sterile 4x4 in 1 x Per Week/30 Days Discharge Instructions: Apply over primary dressing as directed. Secondary Dressing: Zetuvit Plus 4x8 in 1 x Per Week/30 Days Discharge Instructions: Apply over primary dressing as directed. Compression Wrap: FourPress (4 layer compression wrap) 1 x Per Week/30 Days Discharge Instructions: Apply four layer compression as directed. use first layer unna boot to upper portion of lower legs to secure wrap in place. Electronic Signature(s) Signed:  10/11/2022 1:20:08 PM By: [name] DO Entered By: [name] on 10/11/2022 12:22:35 -------------------------------------------------------------------------------- Problem List Details Patient Name: Date of Service: [place], [name] RIA N 10/11/2022 11:15 A M Medical Record Number: [id number] Patient Account Number: [id number] Date of Birth/Sex: Treating RN: 1950-12-05 (72 y.o. [name] Primary Care Provider: [facility] A Jeri [place], [name] Other ClinicianKADIJA, CRUZEN ([id number]) [id number] Page 4 of 9 Referring Provider: Treating Provider/Extender: [name] [name], [name] Weeks in Treatment: 12 Active Problems ICD-10 Encounter Code Description Active Date MDM Diagnosis L97.812 Non-pressure chronic ulcer of other part of right lower leg with fat layer 07/19/2022 No Yes exposed L97.822 Non-pressure chronic ulcer of other part of left lower leg with fat layer exposed7/20/2023 No Yes I89.0 Lymphedema, not elsewhere classified 07/19/2022 No Yes I87.333 Chronic venous hypertension (idiopathic) with ulcer and inflammation of 07/19/2022 No Yes bilateral lower extremity E11.622 Type 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 07/19/2022 No Yes 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 07/19/2022 No Yes Inactive Problems Resolved Problems Electronic Signature(s) Signed: 10/11/2022 1:20:08 PM By: [name] DO Entered By: [name] on 10/11/2022 12:14:07 -------------------------------------------------------------------------------- Progress Note Details Patient Name: Date of Service: [name], [place] RIA N 10/11/2022 11:15 A M Medical Record Number: [id number] Patient Account Number: [id number] Date of Birth/Sex: Treating RN: 09/09/1950 (72 y.o. [name] Primary Care Provider: [name], [name] Other Clinician: Referring Provider: Treating Provider/Extender: [name] [name], [name] Weeks in  Treatment: 12 Subjective Chief Complaint Information obtained from Patient 07/19/2022; bilateral lower extremity wounds History of Present Illness (HPI) Admission 07/19/2022 Ms. Meilyn Heindl Is a 72 year old female with a past medical history of endometrial cancer, lymphedema, insulin-dependent type 2 diabetes that presents to the clinic for a 4-week history of weeping to her lower extremities bilaterally. She visited the ED for this issue on 07/07/2022 and she was prescribed doxycycline For bilateral lower extremity cellulitis. She has completed this course. She currently denies signs of infection. She does not have compression stockings. She states she recently gained weight over the past couple months. She  has never had wounds before. 7/28; patient presents for follow-up. We have been using silver alginate under Kerlix/Coban. She is scheduled to get her ABIs with TBI's on 8/7. She states that the wraps slid down to her mid shin. She denies signs of infection. LANYA, BUCKS ([id number]) [id number] Page 5 of 9 8/7; patient presents for follow-up. We have been using silver alginate under Kerlix/Coban. She had arterial studies done that showed an ABI of 0.86 on the right and 0.82 on the left with multiphasic waveforms throughout the lower extremities. 8/14; patient presents for follow-up. We have been using silver alginate under 3 layer compression. She tolerated the wrap well. She has no issues or complaints today. 8/21; patient presents for follow-up. We have been using silver alginate under 3 layer compression. She has no issues or complaints today. She has her juxta lite compressions with her. 8/28; patient presents for follow-up. We have been using silver alginate under 3 layer compression. She has no issues or complaints today. 9/5; patient presents for follow-up. We have been using silver alginate under 4 layer compression to both extremities bilaterally. She has her  juxta lite compression with her today. 9/12; patient presents for follow-up. We have been using silver alginate under 4-layer compression to the right leg. She was supposed to wear her juxta lite compression to the left lower extremity. She states she could not get it on so she has not been wearing it. She also has not been taking her Lasix. 9/19; patient presents for follow-up. We have been using silver alginate under 4-layer compression to her lower extremities bilaterally. She has restarted taking her Lasix on a daily basis. 9/26; patient presents for follow-up. We have been using silver alginate under 4-layer compression to the lower extremities bilaterally. Her left lower extremity wounds have healed. She has juxta lite compressions. 10/12; patient presents for follow-up. Patient had COVID last week and missed her appointment. She states she took her compression wrap off on the right lower extremity. Her wounds were healed on the left lower extremity at last clinic visit but these have since reopened. She has juxta lite compression and states she has been wearing them. Unfortunately she has multiple scattered wounds now to her right lower extremity as well. No signs of infection on exam. We have offered to order her lymphedema pumps however she states she wants to use her husband's. She has not been doing this. Her diuretic is 60 mg of Lasix twice weekly. She has not been contacted for her venous reflux studies. Patient History Information obtained from Patient, Chart. Family History Cancer - Father, Diabetes - Siblings. Social History Former smoker - quit 1988, Marital Status - Married, Alcohol Use - Moderate - cocktails x2 a week, Drug Use - No History, Caffeine Use - Never. Medical History Hematologic/Lymphatic Patient has history of Anemia - iron Cardiovascular Patient has history of Arrhythmia - A. Fib 09/10/2022 dx started eliquis, Hypertension Endocrine Patient has history of Type  II Diabetes Musculoskeletal Patient has history of Gout, Osteoarthritis Oncologic Patient has history of Received Radiation - 2023 Hospitalization/Surgery History - one ovary removed 2020. - pilonidal cyst extraction 1969. - diverculitis temp colostomy. Medical A Surgical History Notes nd Cardiovascular hyperlipidemia Gastrointestinal GERD Diverticulitis Musculoskeletal carpel tunnel bilateral-surgery to right hand Oncologic endometrial cancer skin Ca Objective Constitutional respirations regular, non-labored and within target range for patient.. Vitals Time Taken: 11:18 AM, Height: 64 in, Weight: 325 lbs, BMI: 55.8, Temperature: 99.1 F, Pulse: 76 bpm, Respiratory Rate: 24 breaths/min,  Blood Pressure: 136/84 mmHg. Cardiovascular 2+ dorsalis pedis/posterior tibialis pulses. Psychiatric pleasant and cooperative. General Notes: T the lower extremities there are multiple open wounds limited to skin breakdown with weeping. No signs of surrounding infection. 2+ pitting OLLA, DELANCEY ([id number]) [id number] Page 6 of 9 edema to the knees bilaterally. Integumentary (Hair, Skin) Wound #1RR status is Open. Original cause of wound was Gradually Appeared. The date acquired was: 07/05/2022. The wound has been in treatment 12 weeks. The wound is located on the Left,Circumferential Lower Leg. The wound measures 15cm length x 24cm width x 0.1cm depth; 282.743cm^2 area and 28.274cm^3 volume. There is Fat Layer (Subcutaneous Tissue) exposed. There is no tunneling or undermining noted. There is a large amount of serous drainage noted. The wound margin is distinct with the outline attached to the wound base. There is large (67-100%) red granulation within the wound bed. There is no necrotic tissue within the wound bed. The periwound skin appearance exhibited: Maceration, Hemosiderin Staining. The periwound skin appearance did not exhibit: Callus, Crepitus, Excoriation,  Induration, Rash, Scarring, Dry/Scaly, Atrophie Blanche, Cyanosis, Ecchymosis, Mottled, Pallor, Rubor, Erythema. General Notes: cobblestone appearance. Wound #2 status is Open. Original cause of wound was Gradually Appeared. The date acquired was: 07/05/2022. The wound has been in treatment 12 weeks. The wound is located on the Right,Circumferential Lower Leg. The wound measures 16cm length x 53cm width x 0.1cm depth; 666.018cm^2 area and 66.602cm^3 volume. There is Fat Layer (Subcutaneous Tissue) exposed. There is no tunneling or undermining noted. There is a large amount of serous drainage noted. The wound margin is distinct with the outline attached to the wound base. There is no granulation within the wound bed. There is no necrotic tissue within the wound bed. The periwound skin appearance exhibited: Maceration, Hemosiderin Staining. The periwound skin appearance did not exhibit: Callus, Crepitus, Excoriation, Induration, Rash, Scarring, Dry/Scaly, Atrophie Blanche, Cyanosis, Ecchymosis, Mottled, Pallor, Rubor, Erythema. General Notes: cobblestone appearance Assessment Active Problems ICD-10 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d Non-pressure chronic ulcer of other part of left lower leg with fat layer exposed Lymphedema, not elsewhere classified Chronic venous hypertension (idiopathic) with ulcer and inflammation of bilateral lower extremity Type 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 Malignant neoplasm of endometrium Patient's wounds have reopened. Due to having COVID she could not come in to be rewrapped. She states she has been using her juxta lites. Unfortunately she has develops multiple scattered open wounds to her legs bilaterally due to poor edema control. I recommended silver alginate under 4-layer compression to the lower extremities bilaterally. Follow-up in 1 week. Procedures Wound #1RR Pre-procedure diagnosis of Wound #1RR is a  Lymphedema located on the Left,Circumferential Lower Leg . There was a Four Layer Compression Therapy Procedure by [name], RN. Post procedure Diagnosis Wound #1RR: Same as Pre-Procedure Wound #2 Pre-procedure diagnosis of Wound #2 is a Lymphedema located on the Right,Circumferential Lower Leg . There was a Four Layer Compression Therapy Procedure by [name], RN. Post procedure Diagnosis Wound #2: Same as Pre-Procedure Plan Follow-up Appointments: Return Appointment in 1 week. - Dr. Heber [place] and Tammi Klippel, Room 8 Return Appointment in 2 weeks. - Dr. Heber [place] and Tammi Klippel, Room 8 Other: - continue use the lasix as prescribed. ***ENSURE TO LET CARDIOLOGIST KNOW ABOUT BLISTERS AND SWELLING IN BOTH LEGS.*** [phone] call [facility] to schedule venous studies. Bathing/ Shower/ Hygiene: May shower with protection but do not get wound dressing(s) wet. Edema Control -  Lymphedema / SCD / Other: Lymphedema Pumps. Use Lymphedema pumps on leg(s) 2-3 times a day for 45-60 minutes. If wearing any wraps or hose, do not remove them. Continue exercising as instructed. - 2-3 times a day throughout the day. Elevate legs to the level of the heart or above for 30 minutes daily and/or when sitting, a frequency of: - 3-4 times a day throughout the day. Avoid standing for long periods of time. Exercise regularly WOUND #1RR: - Lower Leg Wound Laterality: Left, Circumferential Peri-Wound Care: Sween Lotion (Moisturizing lotion) 1 x Per Week/30 Days Discharge Instructions: Apply moisturizing lotion as directed Prim Dressing: KerraCel Ag Gelling Fiber Dressing, 4x5 in (silver alginate) 1 x Per Week/30 Days ary Discharge Instructions: Apply silver alginate to wound bed as instructed Secondary Dressing: ABD Pad, 8x10 1 x Per Week/30 Days Discharge Instructions: Apply over primary dressing as directed. Secondary Dressing: Woven Gauze Sponge, Non-Sterile 4x4 in 1 x Per Week/30 Days Discharge  Instructions: Apply over primary dressing as directed. Secondary Dressing: Zetuvit Plus 4x8 in 1 x Per Week/30 Days Discharge Instructions: Apply over primary dressing as directed. AHMARI, GARTON ([id number]) [id number] Page 7 of 9 Com pression Wrap: FourPress (4 layer compression wrap) 1 x Per Week/30 Days Discharge Instructions: Apply four layer compression as directed. use first layer unna boot to upper portion of lower legs to secure wrap in place. WOUND #2: - Lower Leg Wound Laterality: Right, Circumferential Peri-Wound Care: Sween Lotion (Moisturizing lotion) 1 x Per Week/30 Days Discharge Instructions: Apply moisturizing lotion as directed Prim Dressing: KerraCel Ag Gelling Fiber Dressing, 4x5 in (silver alginate) 1 x Per Week/30 Days ary Discharge Instructions: Apply silver alginate to wound bed as instructed Secondary Dressing: ABD Pad, 8x10 1 x Per Week/30 Days Discharge Instructions: Apply over primary dressing as directed. Secondary Dressing: Woven Gauze Sponge, Non-Sterile 4x4 in 1 x Per Week/30 Days Discharge Instructions: Apply over primary dressing as directed. Secondary Dressing: Zetuvit Plus 4x8 in 1 x Per Week/30 Days Discharge Instructions: Apply over primary dressing as directed. Com pression Wrap: FourPress (4 layer compression wrap) 1 x Per Week/30 Days Discharge Instructions: Apply four layer compression as directed. use first layer unna boot to upper portion of lower legs to secure wrap in place. 1. Silver alginate under 4-layer compression to the lower extremities bilaterally 2. Follow-up in 1 week Electronic Signature(s) Signed: 10/11/2022 1:20:08 PM By: [name] DO Entered By: [name] on 10/11/2022 12:25:18 -------------------------------------------------------------------------------- HxROS Details Patient Name: Date of Service: [name], [place] RIA N 10/11/2022 11:15 A M Medical Record Number: [id number] Patient Account  Number: [id number] Date of Birth/Sex: Treating RN: 1950/11/03 (72 y.o. [name] Primary Care Provider: [name], [name] Other Clinician: Referring Provider: Treating Provider/Extender: [name] [name], [name] Weeks in Treatment: 12 Information Obtained From Patient Chart Hematologic/Lymphatic Medical History: Positive for: Anemia - iron Cardiovascular Medical History: Positive for: Arrhythmia - A. Fib 09/10/2022 dx started eliquis; Hypertension Past Medical History Notes: hyperlipidemia Gastrointestinal Medical History: Past Medical History Notes: GERD Diverticulitis Endocrine Medical History: Positive for: Type II Diabetes Time with diabetes: 72 years old Treated with: Insulin, Diet Blood sugar tested every day: Yes Tested : three times a week. Musculoskeletal Medical History: Positive for: Gout; Osteoarthritis Past Medical History Notes: carpel tunnel bilateral-surgery to right hand KASHAUNA, CELMER ([id number]) [phone].pdf Page 8 of 9 Oncologic Medical History: Positive for: Received Radiation - 2023 Past Medical History Notes: endometrial cancer skin Ca Immunizations Pneumococcal Vaccine: Received Pneumococcal Vaccination:  Yes Received Pneumococcal Vaccination On or After 60th Birthday: No Implantable Devices No devices added Hospitalization / Surgery History Type of Hospitalization/Surgery one ovary removed 2020 pilonidal cyst extraction 1969 diverculitis temp colostomy Family and Social History Cancer: Yes - Father; Diabetes: Yes - Siblings; Former smoker - quit 1988; Marital Status - Married; Alcohol Use: Moderate - cocktails x2 a week; Drug Use: No History; Caffeine Use: Never; Financial Concerns: No; Food, Clothing or Shelter Needs: No; Support System Lacking: No; Transportation Concerns: No Electronic Signature(s) Signed: 10/11/2022 1:20:08 PM By: [name] DO Signed: 10/11/2022 4:52:38  PM By: [name] RN, BSN Entered By: [name] on 10/11/2022 12:16:34 -------------------------------------------------------------------------------- SuperBill Details Patient Name: Date of Service: [name] 10/11/2022 Medical Record Number: [id number] Patient Account Number: [id number] Date of Birth/Sex: Treating RN: [birth year] (72 y.o. [name] Primary Care Provider: [name], [name] Other Clinician: Referring Provider: Treating Provider/Extender: [name] [name], [name] Weeks in Treatment: 12 Diagnosis Coding ICD-10 Codes Code Description [phone] 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d L97.822 Non-pressure chronic ulcer of other part of left lower leg with fat layer exposed I89.0 Lymphedema, not elsewhere classified I87.333 Chronic venous hypertension (idiopathic) with ulcer and inflammation of bilateral lower extremity E11.622 Type 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 Facility Procedures : CPT4: Code [id number] Description: 81 BILATERAL: Application of multi-layer venous compression system; leg (below knee), including ankle and t. Modifier: Quantity: 1 Physician Procedures : CPT4 Code Description Modifier [id number] - WC PHYS LEVEL 3 - EST PT ICD-10 Diagnosis Description V89.381 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d AUSTYNN, PRIDMORE ([id number]) [phone]  I71.245 Non-pressure chronic ulcer of other part of left lower leg with fat layer exposed I87.333 Chronic venous hypertension (idiopathic) with ulcer and inflammation of bilateral lower extremity E11.622 Type 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 Quantity: 1 .pdf Page 9 of 9 Electronic Signature(s) Signed: 10/11/2022 1:20:08 PM By: [name] DO Entered By: [name] on 10/11/2022 12:25:36

## 2022-10-12 ENCOUNTER — Other Ambulatory Visit (HOSPITAL_COMMUNITY): Payer: Medicare Other

## 2022-10-16 ENCOUNTER — Ambulatory Visit: Payer: Medicare Other | Attending: Internal Medicine

## 2022-10-16 DIAGNOSIS — I10 Essential (primary) hypertension: Secondary | ICD-10-CM

## 2022-10-16 DIAGNOSIS — Z79899 Other long term (current) drug therapy: Secondary | ICD-10-CM | POA: Diagnosis not present

## 2022-10-17 DIAGNOSIS — I4891 Unspecified atrial fibrillation: Secondary | ICD-10-CM | POA: Insufficient documentation

## 2022-10-17 LAB — BASIC METABOLIC PANEL
BUN/Creatinine Ratio: 21 (ref 12–28)
BUN: 30 mg/dL — ABNORMAL HIGH (ref 8–27)
CO2: 25 mmol/L (ref 20–29)
Calcium: 9.3 mg/dL (ref 8.7–10.3)
Chloride: 102 mmol/L (ref 96–106)
Creatinine, Ser: 1.43 mg/dL — ABNORMAL HIGH (ref 0.57–1.00)
Glucose: 170 mg/dL — ABNORMAL HIGH (ref 70–99)
Potassium: 5 mmol/L (ref 3.5–5.2)
Sodium: 142 mmol/L (ref 134–144)
eGFR: 38 mL/min/{1.73_m2} — ABNORMAL LOW (ref >59)

## 2022-10-17 LAB — PRO B NATRIURETIC PEPTIDE: NT-Pro BNP: 1363 pg/mL — ABNORMAL HIGH (ref 0–301)

## 2022-10-18 ENCOUNTER — Encounter (HOSPITAL_BASED_OUTPATIENT_CLINIC_OR_DEPARTMENT_OTHER): Payer: Medicare Other | Admitting: Internal Medicine

## 2022-10-18 ENCOUNTER — Telehealth: Payer: Self-pay

## 2022-10-18 DIAGNOSIS — I890 Lymphedema, not elsewhere classified: Secondary | ICD-10-CM

## 2022-10-18 DIAGNOSIS — M1990 Unspecified osteoarthritis, unspecified site: Secondary | ICD-10-CM | POA: Diagnosis not present

## 2022-10-18 DIAGNOSIS — Z79899 Other long term (current) drug therapy: Secondary | ICD-10-CM

## 2022-10-18 DIAGNOSIS — I10 Essential (primary) hypertension: Secondary | ICD-10-CM

## 2022-10-18 DIAGNOSIS — L97822 Non-pressure chronic ulcer of other part of left lower leg with fat layer exposed: Secondary | ICD-10-CM

## 2022-10-18 DIAGNOSIS — I87333 Chronic venous hypertension (idiopathic) with ulcer and inflammation of bilateral lower extremity: Secondary | ICD-10-CM | POA: Diagnosis not present

## 2022-10-18 DIAGNOSIS — L03116 Cellulitis of left lower limb: Secondary | ICD-10-CM | POA: Diagnosis not present

## 2022-10-18 DIAGNOSIS — E11622 Type 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: Secondary | ICD-10-CM | POA: Diagnosis not present

## 2022-10-18 DIAGNOSIS — L97812 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d: Secondary | ICD-10-CM

## 2022-10-18 MED ORDER — FUROSEMIDE 40 MG PO TABS: ORAL_TABLET | ORAL | 3 refills | Status: DC

## 2022-10-18 NOTE — Telephone Encounter (Signed)
Unable to leave the pt a message... My Chart message sent to her.

## 2022-10-18 NOTE — Telephone Encounter (Signed)
-----   Message from Dorris Carnes V, MD sent at 10/17/2022  9:35 PM EDT ----- Fluid is a little better but Cr is up I would cut back on lasix to 80 am and 40 pm Check BMET and BNP in 2 wks

## 2022-10-19 ENCOUNTER — Encounter: Payer: Self-pay | Admitting: Gynecologic Oncology

## 2022-10-19 NOTE — Progress Notes (Signed)
[name], [name] ([id number]) [id number] Page 1 of 8 Visit Report for 10/18/2022 Chief Complaint Document Details Patient Name: Date of Service: [name], [name] [name] 10/18/2022 12:45 PM Medical Record Number: [id number] Patient Account Number: [id number] Date of Birth/Sex: Treating RN: [birth year] (72 y.o. [name] Primary Care Provider: [facility] A Jeri [name], [place] [name] Other Clinician: Referring Provider: Treating Provider/Extender: [name] [name] [name]: 13 Information Obtained from: Patient Chief Complaint 07/19/2022; bilateral lower extremity wounds Electronic Signature(s) Signed: 10/18/2022 4:16:28 PM By: [name] Shan DO Entered By: [name] on 10/18/2022 13:13:17 -------------------------------------------------------------------------------- HPI Details Patient Name: Date of Service: [name], [place] [name] 10/18/2022 12:45 PM Medical Record Number: [id number] Patient Account Number: [id number] Date of Birth/Sex: Treating RN: 11/08/1950 (72 y.o. [name] Primary Care Provider: [name], [place] [name] Other Clinician: Referring Provider: Treating Provider/Extender: [name] [name] [name]: 13 History of Present Illness HPI Description: Admission 07/19/2022 [name] Is a 72 year old female with a past medical history of endometrial cancer, lymphedema, insulin-dependent type 2 diabetes that presents to the clinic for a 4-week history of weeping to her lower extremities bilaterally. She visited the ED for this issue on 07/07/2022 and she was prescribed doxycycline For bilateral lower extremity cellulitis. She has completed this course. She currently denies signs of infection. She does not have compression stockings. She states she recently gained weight over the past couple months. She has never had wounds before. 7/28; patient presents for follow-up. We have been using  silver alginate under Kerlix/Coban. She is scheduled to get her ABIs with TBI's on 8/7. She states that the wraps slid down to her mid shin. She denies signs of infection. 8/7; patient presents for follow-up. We have been using silver alginate under Kerlix/Coban. She had arterial studies done that showed an ABI of 0.86 on the right and 0.82 on the left with multiphasic waveforms throughout the lower extremities. 8/14; patient presents for follow-up. We have been using silver alginate under 3 layer compression. She tolerated the wrap well. She has no issues or complaints today. 8/21; patient presents for follow-up. We have been using silver alginate under 3 layer compression. She has no issues or complaints today. She has her juxta lite compressions with her. 8/28; patient presents for follow-up. We have been using silver alginate under 3 layer compression. She has no issues or complaints today. 9/5; patient presents for follow-up. We have been using silver alginate under 4 layer compression to both extremities bilaterally. She has her juxta lite compression with her today. 9/12; patient presents for follow-up. We have been using silver alginate under 4-layer compression to the right leg. She was supposed to wear her juxta lite compression to the left lower extremity. She states she could not get it on so she has not been wearing it. She also has not been taking her Lasix. 9/19; patient presents for follow-up. We have been using silver alginate under 4-layer compression to her lower extremities bilaterally. She has restarted taking her Lasix on a daily basis. 9/26; patient presents for follow-up. We have been using silver alginate under 4-layer compression to the lower extremities bilaterally. Her left lower extremity wounds have healed. She has juxta lite compressions. [name] ([id number]) [id number] Page 2 of 8 10/12; patient presents for follow-up. Patient had  COVID last week and missed her appointment. She states she took her compression wrap off on the right lower extremity. Her wounds  were healed on the left lower extremity at last clinic visit but these have since reopened. She has juxta lite compression and states she has been wearing them. Unfortunately she has multiple scattered wounds now to her right lower extremity as well. No signs of infection on exam. We have offered to order her lymphedema pumps however she states she wants to use her husband's. She has not been doing this. Her diuretic is 60 mg of Lasix twice weekly. She has not been contacted for her venous reflux studies. 10/19; patient presents for follow-up. She is now on 80 mg of Lasix twice daily. She reports significant improvement in her leg swelling. Her wounds have improved greatly. We have been using silver alginate under 4-layer compression. The left leg is healed. She does not have her Velcro compression wrap with her. She has 2 open wounds to the right lower extremity limited to skin breakdown with minimal weeping. She denies signs of infection. Electronic Signature(s) Signed: 10/18/2022 4:16:28 PM By: [name] Shan DO Entered By: [name] on 10/18/2022 13:19:46 -------------------------------------------------------------------------------- Physical Exam Details Patient Name: Date of Service: [name] 10/18/2022 12:45 PM Medical Record Number: [id number] Patient Account Number: [id number] Date of Birth/Sex: Treating RN: 03-11-1950 (72 y.o. [name] Primary Care Provider: [name], [place] [name] Other Clinician: Referring Provider: Treating Provider/Extender: [name] [name] [name]: 13 Constitutional respirations regular, non-labored and within target range for patient.. Cardiovascular 2+ dorsalis pedis/posterior tibialis pulses. Psychiatric pleasant and cooperative. Notes Left lower extremity:  Epithelization to the previous wound sites. Good edema control. No open wounds or weeping noted. Right lower extremity: 2 small open wound limited to skin breakdown with minimal weeping. 2+ pitting edema to the knees bilaterally Electronic Signature(s) Signed: 10/18/2022 4:16:28 PM By: [name] Shan DO Entered By: [name] on 10/18/2022 13:21:46 -------------------------------------------------------------------------------- Physician Orders Details Patient Name: Date of Service: [name] 10/18/2022 12:45 PM Medical Record Number: [id number] Patient Account Number: [id number] Date of Birth/Sex: Treating RN: 1950-02-12 (72 y.o. [name] Primary Care Provider: [name], [place] [name] Other Clinician: Referring Provider: Treating Provider/Extender: [name] [name] [name]: 13 Verbal / Phone Orders: No Diagnosis Coding ICD-10 Coding Code Description [phone] 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d L97.822 Non-pressure chronic ulcer of other part of left lower leg with fat layer exposed Reynolds, [name] ([id number]) [id number] Page 3 of 8 I89.0 Lymphedema, not elsewhere classified I87.333 Chronic venous hypertension (idiopathic) with ulcer and inflammation of bilateral lower extremity E11.622 Type 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 Follow-up Appointments ppointment in 1 week. - Dr. Heber [place] and Tammi Klippel, Room 8 Return A Other: - bring in compression stockings next week. Bathing/ Shower/ Hygiene May shower with protection but do not get wound dressing(s) wet. Edema Control - Lymphedema / SCD / Other Lymphedema Pumps. Use Lymphedema pumps on leg(s) 2-3 times a day for 45-60 minutes. If wearing any wraps or hose, do not remove them. Continue exercising as instructed. - 2-3 times a day throughout the day. Elevate  legs to the level of the heart or above for 30 minutes daily and/or when sitting, a frequency of: - 3-4 times a day throughout the day. Avoid standing for long periods of time. Exercise regularly Wound [name] Wound #1RR - Lower Leg Wound Laterality: Left, Circumferential Peri-Wound Care: Sween Lotion (Moisturizing lotion)  1 x Per Week/30 Days Discharge Instructions: Apply moisturizing lotion as directed Prim Dressing: KerraCel Ag Gelling Fiber Dressing, 4x5 in (silver alginate) 1 x Per Week/30 Days ary Discharge Instructions: Apply silver alginate to wound bed as instructed Secondary Dressing: Woven Gauze Sponge, Non-Sterile 4x4 in 1 x Per Week/30 Days Discharge Instructions: Apply over primary dressing as directed. Compression Wrap: FourPress (4 layer compression wrap) 1 x Per Week/30 Days Discharge Instructions: Apply four layer compression as directed. use first layer unna boot to upper portion of lower legs to secure wrap in place. Wound #2 - Lower Leg Wound Laterality: Right, Circumferential Peri-Wound Care: Sween Lotion (Moisturizing lotion) 1 x Per Week/30 Days Discharge Instructions: Apply moisturizing lotion as directed Prim Dressing: KerraCel Ag Gelling Fiber Dressing, 4x5 in (silver alginate) 1 x Per Week/30 Days ary Discharge Instructions: Apply silver alginate to wound bed as instructed Secondary Dressing: Woven Gauze Sponge, Non-Sterile 4x4 in 1 x Per Week/30 Days Discharge Instructions: Apply over primary dressing as directed. Compression Wrap: FourPress (4 layer compression wrap) 1 x Per Week/30 Days Discharge Instructions: Apply four layer compression as directed. use first layer unna boot to upper portion of lower legs to secure wrap in place. Electronic Signature(s) Signed: 10/18/2022 4:16:28 PM By: [name] Shan DO Entered By: [name] on 10/18/2022 13:22:04 -------------------------------------------------------------------------------- Problem List  Details Patient Name: Date of Service: [name] 10/18/2022 12:45 PM Medical Record Number: [id number] Patient Account Number: [id number] Date of Birth/Sex: Treating RN: [birth year] (72 y.o. [name] Primary Care Provider: [facility] A Jeri [name], [place] [name] Other Clinician: Referring Provider: Treating Provider/Extender: [name] [name] [name]: [address] Active Problems ICD-10 Jeraline Marcinek, [name] ([id number]) [id number] Page 4 of 8 Encounter Code Description Active Date MDM Diagnosis L97.812 Non-pressure chronic ulcer of other part of right lower leg with fat layer 07/19/2022 No Yes exposed L97.822 Non-pressure chronic ulcer of other part of left lower leg with fat layer exposed7/20/2023 No Yes I89.0 Lymphedema, not elsewhere classified 07/19/2022 No Yes I87.333 Chronic venous hypertension (idiopathic) with ulcer and inflammation of 07/19/2022 No Yes bilateral lower extremity E11.622 Type 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 07/19/2022 No Yes 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 07/19/2022 No Yes Inactive Problems Resolved Problems Electronic Signature(s) Signed: 10/18/2022 4:16:28 PM By: [name] Shan DO Entered By: [name] on 10/18/2022 13:12:44 -------------------------------------------------------------------------------- Progress Note Details Patient Name: Date of Service: [name], [place] [name] 10/18/2022 12:45 PM Medical Record Number: [id number] Patient Account Number: [id number] Date of Birth/Sex: Treating RN: [birth year] (72 y.o. [name] Primary Care Provider: [name], [place] [name] Other Clinician: Referring Provider: Treating Provider/Extender: [name] [name] [name]: 13 Subjective Chief Complaint Information obtained from Patient 07/19/2022; bilateral lower extremity wounds History of Present Illness (HPI) Admission 07/19/2022 Ms.  [name] Is a 72 year old female with a past medical history of endometrial cancer, lymphedema, insulin-dependent type 2 diabetes that presents to the clinic for a 4-week history of weeping to her lower extremities bilaterally. She visited the ED for this issue on 07/07/2022 and she was prescribed doxycycline For bilateral lower extremity cellulitis. She has completed this course. She currently denies signs of infection. She does not have compression stockings. She states she recently gained weight over the past couple months. She has never had wounds before. 7/28; patient presents for follow-up. We have been using silver alginate under Kerlix/Coban. She is scheduled to get her ABIs with TBI's on  8/7. She states that the wraps slid down to her mid shin. She denies signs of infection. 8/7; patient presents for follow-up. We have been using silver alginate under Kerlix/Coban. She had arterial studies done that showed an ABI of 0.86 on the right and 0.82 on the left with multiphasic waveforms throughout the lower extremities. 8/14; patient presents for follow-up. We have been using silver alginate under 3 layer compression. She tolerated the wrap well. She has no issues or complaints today. 8/21; patient presents for follow-up. We have been using silver alginate under 3 layer compression. She has no issues or complaints today. She has her juxta lite compressions with her. [name], [name] ([id number]) [id number] Page 5 of 8 8/28; patient presents for follow-up. We have been using silver alginate under 3 layer compression. She has no issues or complaints today. 9/5; patient presents for follow-up. We have been using silver alginate under 4 layer compression to both extremities bilaterally. She has her juxta lite compression with her today. 9/12; patient presents for follow-up. We have been using silver alginate under 4-layer compression to the right leg. She was supposed to wear  her juxta lite compression to the left lower extremity. She states she could not get it on so she has not been wearing it. She also has not been taking her Lasix. 9/19; patient presents for follow-up. We have been using silver alginate under 4-layer compression to her lower extremities bilaterally. She has restarted taking her Lasix on a daily basis. 9/26; patient presents for follow-up. We have been using silver alginate under 4-layer compression to the lower extremities bilaterally. Her left lower extremity wounds have healed. She has juxta lite compressions. 10/12; patient presents for follow-up. Patient had COVID last week and missed her appointment. She states she took her compression wrap off on the right lower extremity. Her wounds were healed on the left lower extremity at last clinic visit but these have since reopened. She has juxta lite compression and states she has been wearing them. Unfortunately she has multiple scattered wounds now to her right lower extremity as well. No signs of infection on exam. We have offered to order her lymphedema pumps however she states she wants to use her husband's. She has not been doing this. Her diuretic is 60 mg of Lasix twice weekly. She has not been contacted for her venous reflux studies. 10/19; patient presents for follow-up. She is now on 80 mg of Lasix twice daily. She reports significant improvement in her leg swelling. Her wounds have improved greatly. We have been using silver alginate under 4-layer compression. The left leg is healed. She does not have her Velcro compression wrap with her. She has 2 open wounds to the right lower extremity limited to skin breakdown with minimal weeping. She denies signs of infection. Patient History Information obtained from Patient, Chart. Family History Cancer - Father, Diabetes - Siblings. Social History Former smoker - quit 1988, Marital Status - Married, Alcohol Use - Moderate - cocktails x2 a week,  Drug Use - No History, Caffeine Use - Never. Medical History Hematologic/Lymphatic Patient has history of Anemia - iron Cardiovascular Patient has history of Arrhythmia - A. Fib 09/10/2022 dx started eliquis, Hypertension Endocrine Patient has history of Type II Diabetes Musculoskeletal Patient has history of Gout, Osteoarthritis Oncologic Patient has history of Received Radiation - 2023 Hospitalization/Surgery History - one ovary removed 2020. - pilonidal cyst extraction 1969. - diverculitis temp colostomy. Medical A Surgical History Notes nd Cardiovascular hyperlipidemia Gastrointestinal GERD Diverticulitis  Musculoskeletal carpel tunnel bilateral-surgery to right hand Oncologic endometrial cancer skin Ca Objective Constitutional respirations regular, non-labored and within target range for patient.. Vitals Time Taken: 12:50 PM, Height: 64 in, Weight: 325 lbs, BMI: 55.8, Temperature: 97.9 F, Pulse: 93 bpm, Respiratory Rate: 17 breaths/min, Blood Pressure: 103/53 mmHg. Cardiovascular 2+ dorsalis pedis/posterior tibialis pulses. Psychiatric pleasant and cooperative. General Notes: Left lower extremity: Epithelization to the previous wound sites. Good edema control. No open wounds or weeping noted. Right lower extremity: 2 small open wound limited to skin breakdown with minimal weeping. 2+ pitting edema to the knees bilaterally Integumentary (Hair, Skin) Wound #1RR status is Open. Original cause of wound was Gradually Appeared. The date acquired was: 07/05/2022. The wound has been in [name] 13 [name]. The wound is located on the Left,Circumferential Lower Leg. The wound measures 0cm length x 0cm width x 0cm depth; 0cm^2 area and 0cm^3 volume. There is Fat Layer (Subcutaneous Tissue) exposed. There is no tunneling or undermining noted. There is a large amount of serous drainage noted. The wound margin [name], [name] ([id number]) [id number] Page 6 of 8 is  distinct with the outline attached to the wound base. There is large (67-100%) red granulation within the wound bed. There is no necrotic tissue within the wound bed. The periwound skin appearance exhibited: Maceration, Hemosiderin Staining. The periwound skin appearance did not exhibit: Callus, Crepitus, Excoriation, Induration, Rash, Scarring, Dry/Scaly, Atrophie Blanche, Cyanosis, Ecchymosis, Mottled, Pallor, Rubor, Erythema. Periwound temperature was noted as No Abnormality. The periwound has tenderness on palpation. Wound #2 status is Open. Original cause of wound was Gradually Appeared. The date acquired was: 07/05/2022. The wound has been in [name] 13 [name]. The wound is located on the Right,Circumferential Lower Leg. The wound measures 0.1cm length x 0.1cm width x 0.1cm depth; 0.008cm^2 area and 0.001cm^3 volume. There is Fat Layer (Subcutaneous Tissue) exposed. There is a large amount of serous drainage noted. The wound margin is distinct with the outline attached to the wound base. There is no granulation within the wound bed. There is no necrotic tissue within the wound bed. The periwound skin appearance exhibited: Maceration, Hemosiderin Staining. The periwound skin appearance did not exhibit: Callus, Crepitus, Excoriation, Induration, Rash, Scarring, Dry/Scaly, Atrophie Blanche, Cyanosis, Ecchymosis, Mottled, Pallor, Rubor, Erythema. Assessment Active Problems ICD-10 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d Non-pressure chronic ulcer of other part of left lower leg with fat layer exposed Lymphedema, not elsewhere classified Chronic venous hypertension (idiopathic) with ulcer and inflammation of bilateral lower extremity Type 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 Malignant neoplasm of endometrium Patient's wounds have improved greatly since her edema has decreased with the use of diuretics over the past week. Her left leg is healed. She  would benefit from 1 more office wrap. I asked her to bring her Velcro compression wrap at next clinic visit. The right lower extremity still has a few open areas with weeping. I recommended continue the course with silver alginate under 4-layer compression here. Follow-up in 1 week. Procedures Wound #1RR Pre-procedure diagnosis of Wound #1RR is a Lymphedema located on the Left,Circumferential Lower Leg . There was a Four Layer Compression Therapy Procedure by [name] Hammock, RN. Post procedure Diagnosis Wound #1RR: Same as Pre-Procedure Wound #2 Pre-procedure diagnosis of Wound #2 is a Lymphedema located on the Right,Circumferential Lower Leg . There was a Four Layer Compression Therapy Procedure by [name] Hammock, RN. Post procedure Diagnosis Wound #2: Same as Pre-Procedure Plan Follow-up Appointments: Return  Appointment in 1 week. - Dr. Heber [place] and Tammi Klippel, Room 8 Other: - bring in compression stockings next week. Bathing/ Shower/ Hygiene: May shower with protection but do not get wound dressing(s) wet. Edema Control - Lymphedema / SCD / Other: Lymphedema Pumps. Use Lymphedema pumps on leg(s) 2-3 times a day for 45-60 minutes. If wearing any wraps or hose, do not remove them. Continue exercising as instructed. - 2-3 times a day throughout the day. Elevate legs to the level of the heart or above for 30 minutes daily and/or when sitting, a frequency of: - 3-4 times a day throughout the day. Avoid standing for long periods of time. Exercise regularly WOUND #1RR: - Lower Leg Wound Laterality: Left, Circumferential Peri-Wound Care: Sween Lotion (Moisturizing lotion) 1 x Per Week/30 Days Discharge Instructions: Apply moisturizing lotion as directed Prim Dressing: KerraCel Ag Gelling Fiber Dressing, 4x5 in (silver alginate) 1 x Per Week/30 Days ary Discharge Instructions: Apply silver alginate to wound bed as instructed Secondary Dressing: Woven Gauze Sponge, Non-Sterile 4x4 in 1 x Per  Week/30 Days Discharge Instructions: Apply over primary dressing as directed. Com pression Wrap: FourPress (4 layer compression wrap) 1 x Per Week/30 Days Discharge Instructions: Apply four layer compression as directed. use first layer unna boot to upper portion of lower legs to secure wrap in place. WOUND #2: - Lower Leg Wound Laterality: Right, Circumferential Peri-Wound Care: Sween Lotion (Moisturizing lotion) 1 x Per Week/30 Days Discharge Instructions: Apply moisturizing lotion as directed Prim Dressing: KerraCel Ag Gelling Fiber Dressing, 4x5 in (silver alginate) 1 x Per Week/30 Days ary Discharge Instructions: Apply silver alginate to wound bed as instructed Secondary Dressing: Woven Gauze Sponge, Non-Sterile 4x4 in 1 x Per Week/30 Days Discharge Instructions: Apply over primary dressing as directed. Com pression Wrap: FourPress (4 layer compression wrap) 1 x Per Week/30 Days Discharge Instructions: Apply four layer compression as directed. use first layer unna boot to upper portion of lower legs to secure wrap in place. [name], [name] ([id number]) [id number] Page 7 of 8 1. Silver alginate under 4-layer compression to the right lower extremity 2. 4-layer compression to the left lower extremity 3. Follow-up in 1 week Electronic Signature(s) Signed: 10/18/2022 4:16:28 PM By: [name] Shan DO Entered By: [name] on 10/18/2022 13:23:37 -------------------------------------------------------------------------------- HxROS Details Patient Name: Date of Service: [name], [place] [name] 10/18/2022 12:45 PM Medical Record Number: [id number] Patient Account Number: [id number] Date of Birth/Sex: Treating RN: 18-Nov-1950 (72 y.o. [name] Primary Care Provider: [name], [place] [name] Other Clinician: Referring Provider: Treating Provider/Extender: [name] [name] [name]: 13 Information Obtained From Patient  Chart Hematologic/Lymphatic Medical History: Positive for: Anemia - iron Cardiovascular Medical History: Positive for: Arrhythmia - A. Fib 09/10/2022 dx started eliquis; Hypertension Past Medical History Notes: hyperlipidemia Gastrointestinal Medical History: Past Medical History Notes: GERD Diverticulitis Endocrine Medical History: Positive for: Type II Diabetes Time with diabetes: 72 years old Treated with: Insulin, Diet Blood sugar tested every day: Yes Tested : three times a week. Musculoskeletal Medical History: Positive for: Gout; Osteoarthritis Past Medical History Notes: carpel tunnel bilateral-surgery to right hand Oncologic Medical History: Positive for: Received Radiation - 2023 Past Medical History Notes: endometrial cancer skin Ca Immunizations Pneumococcal Vaccine: Received Pneumococcal Vaccination: Yes Received Pneumococcal Vaccination On or After 60th BirthdayBROOKLYNNE, [name] ([id number]) [id number] Page 8 of 8 Implantable Devices No devices added Hospitalization / Surgery History Type of Hospitalization/Surgery one ovary removed 2020 pilonidal cyst extraction 1969 diverculitis  temp colostomy Family and Social History Cancer: Yes - Father; Diabetes: Yes - Siblings; Former smoker - quit 1988; Marital Status - Married; Alcohol Use: Moderate - cocktails x2 a week; Drug Use: No History; Caffeine Use: Never; Financial Concerns: No; Food, Clothing or Shelter Needs: No; Support System Lacking: No; Transportation Concerns: No Electronic Signature(s) Signed: 10/18/2022 4:16:28 PM By: [name] Shan DO Signed: 10/18/2022 6:08:05 PM By: [name] RN, BSN Entered By: [name] on 10/18/2022 13:19:58 -------------------------------------------------------------------------------- SuperBill Details Patient Name: Date of Service: [name] 10/18/2022 Medical Record Number: [id number] Patient Account Number:  [id number] Date of Birth/Sex: Treating RN: February 18, 1950 (72 y.o. Tonita Phoenix, Lauren Primary Care Provider: [name], [place] [name] Other Clinician: Referring Provider: Treating Provider/Extender: [name] [name] [name]: 13 Diagnosis Coding ICD-10 Codes Code Description [phone] 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d L97.822 Non-pressure chronic ulcer of other part of left lower leg with fat layer exposed I89.0 Lymphedema, not elsewhere classified I87.333 Chronic venous hypertension (idiopathic) with ulcer and inflammation of bilateral lower extremity E11.622 Type 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 Facility Procedures : CPT4: Code [id number] Description: 81 BILATERAL: Application of multi-layer venous compression system; leg (below knee), including ankle and t. Modifier: Quantity: 1 Physician Procedures : CPT4 Code Description Modifier [id number] - WC PHYS LEVEL 3 - EST PT ICD-10 Diagnosis Description T02.409 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d L97.822 Non-pressure chronic ulcer of other part of left  lower leg with fat layer exposed I89.0 Lymphedema, not elsewhere classified I87.333 Chronic venous hypertension (idiopathic) with ulcer and inflammation of bilateral lower extremity Quantity: 1 Electronic Signature(s) Signed: 10/18/2022 4:16:28 PM By: [name] Shan DO Entered By: [name] on 10/18/2022 13:23:52

## 2022-10-19 NOTE — Progress Notes (Signed)
CHALEE, HIROTA ([id number]) [id number] Page 1 of 10 Visit Report for 10/18/2022 Arrival Information Details Patient Name: Date of Service: [place], [name] 10/18/2022 12:45 PM Medical Record Number: [id number] Patient Account Number: [id number] Date of Birth/Sex: Treating RN: [birth year] (72 y.o. Tonita Phoenix, Lauren Primary Care Curlie Macken: [name], [name] Other Clinician: Referring Mattison Stuckey: Treating Steffanie Mingle/Extender: [name] [name], [name] Weeks in Treatment: 13 Visit Information History Since Last Visit Added or deleted any medications: No Patient Arrived: Ambulatory Any new allergies or adverse reactions: No Arrival Time: 12:44 Had a fall or experienced change in No Accompanied By: self activities of daily living that may affect Transfer Assistance: None risk of falls: Patient Identification Verified: Yes Signs or symptoms of abuse/neglect since last visito No Patient Requires Transmission-Based No Hospitalized since last visit: No Precautions: Implantable device outside of the clinic excluding No Patient Has Alerts: Yes cellular tissue based products placed in the center Patient Alerts: Patient on Blood Thinner since last visit: 08/06/2022 TBI L 0.59 R0.65 Has Dressing in Place as Prescribed: Yes 08/06/2022 ABI R 0.86 Pain Present Now: No L0.82 Electronic Signature(s) Signed: 10/18/2022 4:05:37 PM By: Rhae Hammock RN Entered By: Rhae Hammock on 10/18/2022 12:45:26 -------------------------------------------------------------------------------- Compression Therapy Details Patient Name: Date of Service: [name] 10/18/2022 12:45 PM Medical Record Number: [id number] Patient Account Number: [id number] Date of Birth/Sex: Treating RN: 1950-09-24 (72 y.o. [name] Primary Care Bret Stamour: [name], [name] Other Clinician: Referring Honor Fairbank: Treating Kamrynn Melott/Extender: [name] [name], [name] Weeks in Treatment: 13 Compression Therapy Performed for Wound Assessment: Wound #1RR Left,Circumferential Lower Leg Performed By: Clinician Rhae Hammock, RN Compression Type: Four Layer Post Procedure Diagnosis Same as Pre-procedure Electronic Signature(s) Signed: 10/18/2022 4:05:37 PM By: Rhae Hammock RN Entered By: Rhae Hammock on 10/18/2022 13:09:02 [name] ([id number]) [id number] Page 2 of 10 -------------------------------------------------------------------------------- Compression Therapy Details Patient Name: Date of Service: COHEN, DOLEMAN 10/18/2022 12:45 PM Medical Record Number: [id number] Patient Account Number: [id number] Date of Birth/Sex: Treating RN: Jun 16, 1950 (72 y.o. [name] Primary Care Mervin Ramires: [name], [name] Other Clinician: Referring Leita Lindbloom: Treating Lorann Tani/Extender: [name] [name], [name] Weeks in Treatment: 13 Compression Therapy Performed for Wound Assessment: Wound #2 Right,Circumferential Lower Leg Performed By: Clinician Rhae Hammock, RN Compression Type: Four Layer Post Procedure Diagnosis Same as Pre-procedure Electronic Signature(s) Signed: 10/18/2022 4:05:37 PM By: Rhae Hammock RN Entered By: Rhae Hammock on 10/18/2022 13:09:02 -------------------------------------------------------------------------------- Encounter Discharge Information Details Patient Name: Date of Service: [name] 10/18/2022 12:45 PM Medical Record Number: [id number] Patient Account Number: [id number] Date of Birth/Sex: Treating RN: April 29, 1950 (72 y.o. [name] Primary Care Keshan Reha: [name], [name] Other Clinician: Referring Savina Olshefski: Treating Inetta Dicke/Extender: [name] [name], [name] Weeks in Treatment: 13 Encounter Discharge Information Items Discharge Condition:  Stable Ambulatory Status: Wheelchair Discharge Destination: Home Transportation: Private Auto Accompanied By: self Schedule Follow-up Appointment: Yes Clinical Summary of Care: Electronic Signature(s) Signed: 10/18/2022 4:05:37 PM By: Rhae Hammock RN Entered By: Rhae Hammock on 10/18/2022 13:10:48 -------------------------------------------------------------------------------- Lower Extremity Assessment Details Patient Name: Date of Service: [name] 10/18/2022 12:45 PM Medical Record Number: [id number] Patient Account Number: [id number] Date of Birth/Sex: Treating RN: Dec 15, 1950 (72 y.o. [name] Primary Care Jasmin Winberry: [name], [name] Other Clinician: Referring Nadra Hritz: Treating Shadow Stiggers/Extender: [name] [name], [name]  Weeks in Treatment: 13 Edema Assessment Assessed: [Left: Yes] [Right: Yes] Edema: [Left: Yes] [Right: Yes] Calf VILLA, BURGIN ([id number]) [id number] Page 3 of 10 Left: Right: Point of Measurement: 39 cm From Medial Instep 52 cm 53 cm Ankle Left: Right: Point of Measurement: 10 cm From Medial Instep 39.5 cm 29.5 cm Vascular Assessment Pulses: Dorsalis Pedis Palpable: [Left:Yes] [Right:Yes] Posterior Tibial Palpable: [Left:Yes] [Right:Yes] Electronic Signature(s) Signed: 10/18/2022 4:05:37 PM By: Rhae Hammock RN Entered By: Rhae Hammock on 10/18/2022 12:51:19 -------------------------------------------------------------------------------- Multi Wound Chart Details Patient Name: Date of Service: [name] 10/18/2022 12:45 PM Medical Record Number: [id number] Patient Account Number: [id number] Date of Birth/Sex: Treating RN: January 20, 1950 (72 y.o. [name] Primary Care Verlyn Lambert: [name], [name] Other Clinician: Referring Dyneshia Baccam: Treating Marqueta Pulley/Extender: [name] [name], [name] Weeks in Treatment: 13 Vital  Signs Height(in): 64 Pulse(bpm): 93 Weight(lbs): 325 Blood Pressure(mmHg): 103/53 Body Mass Index(BMI): 55.8 Temperature(F): 97.9 Respiratory Rate(breaths/min): 17 [1RR:Photos:] [N/A:N/A] Left, Circumferential Lower Leg Right, Circumferential Lower Leg N/A Wound Location: Gradually Appeared Gradually Appeared N/A Wounding Event: Lymphedema Lymphedema N/A Primary Etiology: Anemia, Arrhythmia, Hypertension, Anemia, Arrhythmia, Hypertension, N/A Comorbid History: Type II Diabetes, Gout, Osteoarthritis, Type II Diabetes, Gout, Osteoarthritis, Received Radiation Received Radiation 07/05/2022 07/05/2022 N/A Date Acquired: 13 13 N/A Weeks of Treatment: Open Open N/A Wound Status: Yes No N/A Wound Recurrence: Yes Yes N/A Clustered Wound: 5 10 N/A Clustered Quantity: 0x0x0 0.1x0.1x0.1 N/A Measurements L x W x D (cm) 0 0.008 N/A A (cm) : rea 0 0.001 N/A Volume (cm) : 100.00% 100.00% N/A % Reduction in Area: 100.00% 100.00% N/A % Reduction in Volume: Full Thickness Without Exposed Full Thickness Without Exposed N/A Classification: Support Structures Support Structures Large Large N/A Exudate Amount: Serous Serous N/A Exudate TypeALIZEY, NOREN ([id number]) [id number] Page 4 of 10 amber amber N/A Exudate Color: Distinct, outline attached Distinct, outline attached N/A Wound Margin: Large (67-100%) None Present (0%) N/A Granulation Amount: Red N/A N/A Granulation Quality: None Present (0%) None Present (0%) N/A Necrotic Amount: Fat Layer (Subcutaneous Tissue): Yes Fat Layer (Subcutaneous Tissue): Yes N/A Exposed Structures: Fascia: No Fascia: No Tendon: No Tendon: No Muscle: No Muscle: No Joint: No Joint: No Bone: No Bone: No None None N/A Epithelialization: Excoriation: No Excoriation: No N/A Periwound Skin Texture: Induration: No Induration: No Callus: No Callus: No Crepitus: No Crepitus: No Rash: No Rash:  No Scarring: No Scarring: No Maceration: Yes Maceration: Yes N/A Periwound Skin Moisture: Dry/Scaly: No Dry/Scaly: No Hemosiderin Staining: Yes Hemosiderin Staining: Yes N/A Periwound Skin Color: Atrophie Blanche: No Atrophie Blanche: No Cyanosis: No Cyanosis: No Ecchymosis: No Ecchymosis: No Erythema: No Erythema: No Mottled: No Mottled: No Pallor: No Pallor: No Rubor: No Rubor: No No Abnormality N/A N/A Temperature: Yes N/A N/A Tenderness on Palpation: Compression Therapy Compression Therapy N/A Procedures Performed: Treatment Notes Wound #1RR (Lower Leg) Wound Laterality: Left, Circumferential Cleanser Peri-Wound Care Sween Lotion (Moisturizing lotion) Discharge Instruction: Apply moisturizing lotion as directed Topical Primary Dressing KerraCel Ag Gelling Fiber Dressing, 4x5 in (silver alginate) Discharge Instruction: Apply silver alginate to wound bed as instructed Secondary Dressing Woven Gauze Sponge, Non-Sterile 4x4 in Discharge Instruction: Apply over primary dressing as directed. Secured With Compression Wrap FourPress (4 layer compression wrap) Discharge Instruction: Apply four layer compression as directed. use first layer unna boot to upper portion of lower legs to secure wrap in place. Compression Stockings Add-Ons Wound #2 (Lower Leg) Wound Laterality: Right, Circumferential Cleanser Peri-Wound Care Sween Lotion (Moisturizing  lotion) Discharge Instruction: Apply moisturizing lotion as directed Topical Primary Dressing KerraCel Ag Gelling Fiber Dressing, 4x5 in (silver alginate) Discharge Instruction: Apply silver alginate to wound bed as instructed Secondary Dressing Woven Gauze Sponge, Non-Sterile 4x4 in Discharge Instruction: Apply over primary dressing as directed. Secured With [place], Jinny Sanders ([id number]) [id number] Page 5 of 10 Compression Wrap FourPress (4 layer compression wrap) Discharge Instruction: Apply  four layer compression as directed. use first layer unna boot to upper portion of lower legs to secure wrap in place. Compression Stockings Add-Ons Electronic Signature(s) Signed: 10/18/2022 4:16:28 PM By: [name] DO Signed: 10/18/2022 6:08:05 PM By: [name] RN, BSN Entered By: [name] on 10/18/2022 13:12:49 -------------------------------------------------------------------------------- [place] Details Patient Name: Date of Service: [place], [name] RIA N 10/18/2022 12:45 PM Medical Record Number: [id number] Patient Account Number: [id number] Date of Birth/Sex: Treating RN: October 15, 1950 (72 y.o. [name] Primary Care Marvel Mcphillips: [name], [name] Other Clinician: Referring Hendrick Pavich: Treating Ragna Kramlich/Extender: [name] [name], [name] Weeks in Treatment: 13 Active Inactive Pain, Acute or Chronic Nursing Diagnoses: Pain, acute or chronic: actual or potential Potential alteration in comfort, pain Goals: Patient will verbalize adequate pain control and receive pain control interventions during procedures as needed Date Initiated: 07/19/2022 Target Resolution Date: 11/30/2022 Goal Status: Active Patient/caregiver will verbalize comfort level met Date Initiated: 07/19/2022 Date Inactivated: 08/06/2022 Target Resolution Date: 08/06/2022 Goal Status: Met Interventions: Encourage patient to take pain medications as prescribed Provide education on pain management Reposition patient for comfort Treatment Activities: Administer pain control measures as ordered : 07/19/2022 Notes: Venous Leg Ulcer Nursing Diagnoses: Actual venous Insuffiency (use after diagnosis is confirmed) Potential for venous Insuffiency (use before diagnosis confirmed) Goals: Non-invasive venous studies are completed as ordered Date Initiated: 07/19/2022 Target Resolution Date: 10/26/2022 Goal Status: Active Interventions: Assess peripheral edema  status every visit. Compression as ordered Provide education on venous insufficiency Treatment Activities: SURY, WENTWORTH ([id number]) [id number] Page 6 of 10 Non-invasive vascular studies : 07/19/2022 Notes: 10/11/2022: patient not maintaining/controlling the edema. Patient had COVID last week and missed appt. Patient notes wearing [employer] HD daily. Electronic Signature(s) Signed: 10/18/2022 4:05:37 PM By: Rhae Hammock RN Entered By: Rhae Hammock on 10/18/2022 13:10:01 -------------------------------------------------------------------------------- Pain Assessment Details Patient Name: Date of Service: [name] 10/18/2022 12:45 PM Medical Record Number: [id number] Patient Account Number: [id number] Date of Birth/Sex: Treating RN: 24-Jun-1950 (72 y.o. [name] Primary Care Laverle Pillard: [name], [name] Other Clinician: Referring Artha Stavros: Treating Tita Terhaar/Extender: [name] [name], [name] Weeks in Treatment: 13 Active Problems Location of Pain Severity and Description of Pain Patient Has Paino No Site Locations Pain Management and Medication Current Pain Management: Electronic Signature(s) Signed: 10/18/2022 4:05:37 PM By: Rhae Hammock RN Entered By: Rhae Hammock on 10/18/2022 12:51:11 -------------------------------------------------------------------------------- Patient/Caregiver Education Details Patient Name: Date of Service: Aleda Grana 10/19/2023andnbsp12:45 PM Medical Record Number: [id number] Patient Account Number: [id number] Date of Birth/Gender: Treating RN: 1950-08-11 (72 y.o. [name] Primary Care Physician: [facility] A Jeri [place], [name] Other Clinician: Referring Physician: Treating Physician/Extender: [name] [name], [name] Weeks in Treatment: [address], [place] ([id number]) [id number] Page 7 of 10 Education  Assessment Education Provided To: Patient Education Topics Provided Venous: Handouts: Controlling Swelling with Compression Stockings , Controlling Swelling with Multilayered Compression Wraps Methods: Explain/Verbal Responses: Reinforcements needed Electronic Signature(s) Signed: 10/18/2022 4:05:37 PM By: Rhae Hammock RN Entered By: Rhae Hammock on 10/18/2022 13:10:17 --------------------------------------------------------------------------------  Wound Assessment Details Patient Name: Date of Service: RYANE, KONIECZNY 10/18/2022 12:45 PM Medical Record Number: [id number] Patient Account Number: [id number] Date of Birth/Sex: Treating RN: 06/01/1950 (72 y.o. Tonita Phoenix, Lauren Primary Care Analea Muller: [name], [name] Other Clinician: Referring Shalom Ware: Treating Lyndsay Talamante/Extender: [name] [name], [name] Weeks in Treatment: 13 Wound Status Wound Number: 1RR Primary Lymphedema Etiology: Wound Location: Left, Circumferential Lower Leg Wound Open Wounding Event: Gradually Appeared Status: Date Acquired: 07/05/2022 Comorbid Anemia, Arrhythmia, Hypertension, Type II Diabetes, Gout, Weeks Of Treatment: 13 History: Osteoarthritis, Received Radiation Clustered Wound: Yes Photos Wound Measurements Length: (cm) Width: (cm) Depth: (cm) Clustered Quantity: Area: (cm) Volume: (cm) 0 % Reduction in Area: 100% 0 % Reduction in Volume: 100% 0 Epithelialization: None 5 Tunneling: No 0 Undermining: No 0 Wound Description Classification: Full Thickness Without Exposed Sup Wound Margin: Distinct, outline attached Exudate Amount: Large Exudate Type: Serous Exudate Color: amber port Structures Foul Odor After Cleansing: No Slough/Fibrino No Wound Bed Whisner, Jinny Sanders ([id number]) [id number] Page 8 of 10 Granulation Amount: Large (67-100%) Exposed Structure Granulation Quality: Red Fascia Exposed: No Necrotic Amount: None  Present (0%) Fat Layer (Subcutaneous Tissue) Exposed: Yes Tendon Exposed: No Muscle Exposed: No Joint Exposed: No Bone Exposed: No Periwound Skin Texture Texture Color No Abnormalities Noted: No No Abnormalities Noted: No Callus: No Atrophie Blanche: No Crepitus: No Cyanosis: No Excoriation: No Ecchymosis: No Induration: No Erythema: No Rash: No Hemosiderin Staining: Yes Scarring: No Mottled: No Pallor: No Moisture Rubor: No No Abnormalities Noted: No Dry / Scaly: No Temperature / Pain Maceration: Yes Temperature: No Abnormality Tenderness on Palpation: Yes Treatment Notes Wound #1RR (Lower Leg) Wound Laterality: Left, Circumferential Cleanser Peri-Wound Care Sween Lotion (Moisturizing lotion) Discharge Instruction: Apply moisturizing lotion as directed Topical Primary Dressing KerraCel Ag Gelling Fiber Dressing, 4x5 in (silver alginate) Discharge Instruction: Apply silver alginate to wound bed as instructed Secondary Dressing Woven Gauze Sponge, Non-Sterile 4x4 in Discharge Instruction: Apply over primary dressing as directed. Secured With Compression Wrap FourPress (4 layer compression wrap) Discharge Instruction: Apply four layer compression as directed. use first layer unna boot to upper portion of lower legs to secure wrap in place. Compression Stockings Add-Ons Electronic Signature(s) Signed: 10/18/2022 4:05:37 PM By: Rhae Hammock RN Entered By: Rhae Hammock on 10/18/2022 12:58:26 -------------------------------------------------------------------------------- Wound Assessment Details Patient Name: Date of Service: [name] 10/18/2022 12:45 PM Medical Record Number: [id number] Patient Account Number: [id number] Date of Birth/Sex: Treating RN: 15-Feb-1950 (72 y.o. [name] Primary Care Lorisa Scheid: MEDINA-V A Jeri [place], [name] Other Clinician: Referring Jashawn Floyd: Treating Latorie Montesano/Extender: [name] [name], [place]  NINA Weeks in Treatment: 13 Wound Status Wound Number: 2 Primary Lymphedema Liese, Jinny Sanders ([id number]) [id number] Page 9 of 10 Etiology: Wound Location: Right, Circumferential Lower Leg Wound Open Wounding Event: Gradually Appeared Status: Date Acquired: 07/05/2022 Comorbid Anemia, Arrhythmia, Hypertension, Type II Diabetes, Gout, Weeks Of Treatment: 13 History: Osteoarthritis, Received Radiation Clustered Wound: Yes Photos Wound Measurements Length: (cm) 0.1 Width: (cm) 0.1 Depth: (cm) 0.1 Clustered Quantity: 10 Area: (cm) 0.00 Volume: (cm) 0.00 % Reduction in Area: 100% % Reduction in Volume: 100% Epithelialization: None 8 1 Wound Description Classification: Full Thickness Without Exposed Sup Wound Margin: Distinct, outline attached Exudate Amount: Large Exudate Type: Serous Exudate Color: amber port Structures Foul Odor After Cleansing: No Slough/Fibrino No Wound Bed Granulation Amount: None Present (0%) Exposed Structure Necrotic Amount: None Present (0%) Fascia Exposed: No Fat Layer (Subcutaneous Tissue) Exposed: Yes  Tendon Exposed: No Muscle Exposed: No Joint Exposed: No Bone Exposed: No Periwound Skin Texture Texture Color No Abnormalities Noted: No No Abnormalities Noted: No Callus: No Atrophie Blanche: No Crepitus: No Cyanosis: No Excoriation: No Ecchymosis: No Induration: No Erythema: No Rash: No Hemosiderin Staining: Yes Scarring: No Mottled: No Pallor: No Moisture Rubor: No No Abnormalities Noted: No Dry / Scaly: No Maceration: Yes Treatment Notes Wound #2 (Lower Leg) Wound Laterality: Right, Circumferential Cleanser Peri-Wound Care Sween Lotion (Moisturizing lotion) Discharge Instruction: Apply moisturizing lotion as directed Topical Primary Dressing KerraCel Ag Gelling Fiber Dressing, 4x5 in (silver alginate) Discharge Instruction: Apply silver alginate to wound bed as instructed SMRITHI, PIGFORD  ([id number]) [id number] Page 10 of 10 Secondary Dressing Woven Gauze Sponge, Non-Sterile 4x4 in Discharge Instruction: Apply over primary dressing as directed. Secured With Compression Wrap FourPress (4 layer compression wrap) Discharge Instruction: Apply four layer compression as directed. use first layer unna boot to upper portion of lower legs to secure wrap in place. Compression Stockings Add-Ons Electronic Signature(s) Signed: 10/18/2022 4:05:37 PM By: Rhae Hammock RN Entered By: Rhae Hammock on 10/18/2022 12:58:44 -------------------------------------------------------------------------------- Vitals Details Patient Name: Date of Service: [name], [place] N 10/18/2022 12:45 PM Medical Record Number: [id number] Patient Account Number: [id number] Date of Birth/Sex: Treating RN: 1950-05-15 (72 y.o. Tonita Phoenix, Lauren Primary Care Jerrianne Hartin: [name], [name] Other Clinician: Referring Nyelle Wolfson: Treating Johanan Skorupski/Extender: [name] [name], [name] Weeks in Treatment: 13 Vital Signs Time Taken: 12:50 Temperature (F): 97.9 Height (in): 64 Pulse (bpm): 93 Weight (lbs): 325 Respiratory Rate (breaths/min): 17 Body Mass Index (BMI): 55.8 Blood Pressure (mmHg): 103/53 Reference Range: 80 - 120 mg / dl Electronic Signature(s) Signed: 10/18/2022 4:05:37 PM By: Rhae Hammock RN Entered By: Rhae Hammock on 10/18/2022 12:51:06

## 2022-10-19 NOTE — Telephone Encounter (Signed)
Pt advised and will have labs 11/01/22.

## 2022-10-25 ENCOUNTER — Encounter (HOSPITAL_BASED_OUTPATIENT_CLINIC_OR_DEPARTMENT_OTHER): Payer: Medicare Other | Admitting: Internal Medicine

## 2022-10-25 DIAGNOSIS — N1831 Chronic kidney disease, stage 3a: Secondary | ICD-10-CM | POA: Diagnosis not present

## 2022-10-25 DIAGNOSIS — E1122 Type 2 diabetes mellitus with diabetic chronic kidney disease: Secondary | ICD-10-CM | POA: Diagnosis not present

## 2022-10-25 DIAGNOSIS — L97822 Non-pressure chronic ulcer of other part of left lower leg with fat layer exposed: Secondary | ICD-10-CM

## 2022-10-25 DIAGNOSIS — M1990 Unspecified osteoarthritis, unspecified site: Secondary | ICD-10-CM | POA: Diagnosis not present

## 2022-10-25 DIAGNOSIS — I87333 Chronic venous hypertension (idiopathic) with ulcer and inflammation of bilateral lower extremity: Secondary | ICD-10-CM

## 2022-10-25 DIAGNOSIS — L03116 Cellulitis of left lower limb: Secondary | ICD-10-CM | POA: Diagnosis not present

## 2022-10-25 DIAGNOSIS — L97812 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d: Secondary | ICD-10-CM

## 2022-10-25 DIAGNOSIS — Z794 Long term (current) use of insulin: Secondary | ICD-10-CM | POA: Diagnosis not present

## 2022-10-25 DIAGNOSIS — R600 Localized edema: Secondary | ICD-10-CM | POA: Diagnosis not present

## 2022-10-25 DIAGNOSIS — E11622 Type 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: Secondary | ICD-10-CM | POA: Diagnosis not present

## 2022-10-25 DIAGNOSIS — I890 Lymphedema, not elsewhere classified: Secondary | ICD-10-CM

## 2022-10-25 NOTE — Progress Notes (Signed)
[name], [name] ([id number]) [id number] Page 1 of 9 Visit Report for 10/25/2022 Arrival Information Details Patient Name: Date of Service: CLITES, [name] 10/25/2022 9:00 A M Medical Record Number: [id number] Patient Account Number: [id number] Date of Birth/Sex: Treating RN: [birth year] (72 y.o. Harlow Ohms Primary Care Takyia Sindt: [name] [name] Other Clinician: Referring Shaely Gadberry: Treating Amanda Pote/Extender: [name] [name] [name] Weeks in Treatment: 14 Visit Information History Since Last Visit Added or deleted any medications: No Patient Arrived: Wheel Chair Any new allergies or adverse reactions: No Arrival Time: 09:10 Had a fall or experienced change in No Accompanied By: self activities of daily living that may affect Transfer Assistance: None risk of falls: Patient Identification Verified: Yes Signs or symptoms of abuse/neglect since last visito No Secondary Verification Process Completed: Yes Hospitalized since last visit: No Patient Requires Transmission-Based No Implantable device outside of the clinic excluding No Precautions: cellular tissue based products placed in the center Patient Has Alerts: Yes since last visit: Patient Alerts: Patient on Blood Thinner Has Dressing in Place as Prescribed: Yes 08/06/2022 TBI L 0.59 R0.65 Has Compression in Place as Prescribed: Yes 08/06/2022 ABI R 0.86 Pain Present Now: Yes L0.82 Electronic Signature(s) Signed: 10/25/2022 4:24:03 PM By: Adline Peals Entered By: Adline Peals on 10/25/2022 09:12:58 -------------------------------------------------------------------------------- Compression Therapy Details Patient Name: Date of Service: [name] 10/25/2022 9:00 A M Medical Record Number: [id number] Patient Account Number: [id number] Date of Birth/Sex: Treating RN: Mar 15, 1950 (72 y.o. [name] Primary Care Litzy Dicker: [name] Other Clinician: Referring Berdine Rasmusson: Treating Raiford Fetterman/Extender: [name] [name] [name] Weeks in Treatment: 14 Compression Therapy Performed for Wound Assessment: Wound #2 Right,Circumferential Lower Leg Performed By: Clinician Rhae Hammock, RN Compression Type: Four Layer Post Procedure Diagnosis Same as Pre-procedure Electronic Signature(s) Signed: 10/25/2022 4:44:37 PM By: Rhae Hammock RN Entered By: Rhae Hammock on 10/25/2022 09:44:13 [name] ([id number]) [id number] Page 2 of 9 -------------------------------------------------------------------------------- Encounter Discharge Information Details Patient Name: Date of Service: [name], [name] 10/25/2022 9:00 A M Medical Record Number: [id number] Patient Account Number: [id number] Date of Birth/Sex: Treating RN: 12/10/1950 (72 y.o. [name] Primary Care [name]: [name] [name] Other Clinician: Referring Martise Waddell: Treating [name]/Extender: [name] [name] [name] Weeks in Treatment: 14 Encounter Discharge Information Items Discharge Condition: Stable Ambulatory Status: Wheelchair Discharge Destination: Home Transportation: Private Auto Accompanied By: self Schedule Follow-up Appointment: Yes Clinical Summary of Care: Patient Declined Electronic Signature(s) Signed: 10/25/2022 4:44:37 PM By: Rhae Hammock RN Entered By: Rhae Hammock on 10/25/2022 09:47:56 -------------------------------------------------------------------------------- Lower Extremity Assessment Details Patient Name: Date of Service: [name] Leyden, MA RIA N 10/25/2022 9:00 A M Medical Record Number: [id number] Patient Account Number: [id number] Date of Birth/Sex: Treating RN: 23-Apr-1950 (72 y.o. Harlow Ohms Primary Care Madysen Faircloth: [name] [name] Other Clinician: Referring Snigdha Howser: Treating Cinsere Mizrahi/Extender: [name] [name] [name] Weeks in Treatment: 14 Edema Assessment Assessed: [Left: No] [Right: No] Edema: [Left: Yes] [Right: Yes] Calf Left: Right: Point of Measurement: 39 cm From Medial Instep 44.8 cm 48 cm Ankle Left: Right: Point of Measurement: 10 cm From Medial Instep 27 cm 27.2 cm Vascular Assessment Pulses: Dorsalis Pedis Palpable: [Left:Yes] [Right:Yes] Electronic Signature(s) Signed: 10/25/2022 4:24:03 PM By: Adline Peals Entered By: Adline Peals on 10/25/2022 09:25:21 Multi Wound Chart Details -------------------------------------------------------------------------------- [name] ([id number]) [id number] Page 3 of 9 Patient Name: Date of Service:  [name], [place] RIA N 10/25/2022 9:00 A M Medical Record Number: [id number] Patient Account Number: [id number] Date of Birth/Sex: Treating RN: [birth year] (72 y.o. [name] Primary Care Rickie Gutierres: [name] [name] Other Clinician: Referring Ernie Sagrero: Treating Windi Toro/Extender: [name] [name] [name] Weeks in Treatment: 14 Vital Signs Height(in): 64 Pulse(bpm): 40 Weight(lbs): 325 Blood Pressure(mmHg): 109/67 Body Mass Index(BMI): 55.8 Temperature(F): 98 Respiratory Rate(breaths/min): 18 Wound Assessments Wound Number: 1RR 2 N/A Photos: N/A Left, Circumferential Lower Leg Right, Circumferential Lower Leg N/A Wound Location: Gradually Appeared Gradually Appeared N/A Wounding Event: Lymphedema Lymphedema N/A Primary Etiology: Anemia, Arrhythmia, Hypertension, Anemia, Arrhythmia, Hypertension, N/A Comorbid History: Type II Diabetes, Gout, Osteoarthritis, Type II Diabetes, Gout, Osteoarthritis, Received Radiation Received Radiation 07/05/2022 07/05/2022 N/A Date Acquired: 14 14 N/A Weeks of Treatment: Open Open N/A Wound Status: Yes No N/A Wound Recurrence: Yes Yes N/A Clustered Wound: 5 10 N/A Clustered Quantity: 0x0x0  0.1x0.1x0.1 N/A Measurements L x W x D (cm) 0 0.008 N/A A (cm) : rea 0 0.001 N/A Volume (cm) : 100.00% 100.00% N/A % Reduction in Area: 100.00% 100.00% N/A % Reduction in Volume: Full Thickness Without Exposed Full Thickness Without Exposed N/A Classification: Support Structures Support Structures None Present Small N/A Exudate Amount: N/A Serous N/A Exudate Type: N/A amber N/A Exudate Color: Distinct, outline attached Distinct, outline attached N/A Wound Margin: None Present (0%) Large (67-100%) N/A Granulation Amount: N/A Red N/A Granulation Quality: None Present (0%) None Present (0%) N/A Necrotic Amount: Fascia: No Fat Layer (Subcutaneous Tissue): Yes N/A Exposed Structures: Fat Layer (Subcutaneous Tissue): No Fascia: No Tendon: No Tendon: No Muscle: No Muscle: No Joint: No Joint: No Bone: No Bone: No Large (67-100%) Large (67-100%) N/A Epithelialization: Excoriation: No Excoriation: No N/A Periwound Skin Texture: Induration: No Induration: No Callus: No Callus: No Crepitus: No Crepitus: No Rash: No Rash: No Scarring: No Scarring: No Dry/Scaly: Yes Dry/Scaly: Yes N/A Periwound Skin Moisture: Maceration: No Maceration: No Hemosiderin Staining: Yes Hemosiderin Staining: Yes N/A Periwound Skin Color: Atrophie Blanche: No Atrophie Blanche: No Cyanosis: No Cyanosis: No Ecchymosis: No Ecchymosis: No Erythema: No Erythema: No Mottled: No Mottled: No Pallor: No Pallor: No Rubor: No Rubor: No No Abnormality No Abnormality N/A Temperature: Yes N/A N/A Tenderness on Palpation: N/A Compression Therapy N/A Procedures Performed: Treatment Notes RASHEEDA, MULVEHILL ([id number]) [id number] Page 4 of 9 Electronic Signature(s) Signed: 10/25/2022 10:11:52 AM By: [name] DO Signed: 10/25/2022 5:00:15 PM By: [name] RN, BSN Entered By: [name] on 10/25/2022  09:45:38 -------------------------------------------------------------------------------- Multi-Disciplinary Care Plan Details Patient Name: Date of Service: [place], [name] RIA N 10/25/2022 9:00 A M Medical Record Number: [id number] Patient Account Number: [id number] Date of Birth/Sex: Treating RN: Apr 10, 1950 (72 y.o. [name] Primary Care Zafirah Vanzee: [name] [name] Other Clinician: Referring Emsley Custer: Treating Cherrell Maybee/Extender: [name] [name] [name] Weeks in Treatment: 14 Active Inactive Pain, Acute or Chronic Nursing Diagnoses: Pain, acute or chronic: actual or potential Potential alteration in comfort, pain Goals: Patient will verbalize adequate pain control and receive pain control interventions during procedures as needed Date Initiated: 07/19/2022 Target Resolution Date: 12/29/2022 Goal Status: Active Patient/caregiver will verbalize comfort level met Date Initiated: 07/19/2022 Date Inactivated: 08/06/2022 Target Resolution Date: 08/06/2022 Goal Status: Met Interventions: Encourage patient to take pain medications as prescribed Provide education on pain management Reposition patient for comfort Treatment Activities: Administer pain control measures as ordered : 07/19/2022 Notes: Venous Leg Ulcer Nursing Diagnoses: Actual venous Insuffiency (use  after diagnosis is confirmed) Potential for venous Insuffiency (use before diagnosis confirmed) Goals: Non-invasive venous studies are completed as ordered Date Initiated: 07/19/2022 Target Resolution Date: 11/03/2022 Goal Status: Active Interventions: Assess peripheral edema status every visit. Compression as ordered Provide education on venous insufficiency Treatment Activities: Non-invasive vascular studies : 07/19/2022 Notes: 10/11/2022: patient not maintaining/controlling the edema. Patient had COVID last week and missed appt. Patient notes wearing Juxt[employer] HD daily. Electronic  Signature(s) Signed: 10/25/2022 4:44:37 PM By: Rhae Hammock RN Entered By: Rhae Hammock on 10/25/2022 09:45:04 [name] ([id number]) [id number] Page 5 of 9 -------------------------------------------------------------------------------- Pain Assessment Details Patient Name: Date of Service: [name], [name] 10/25/2022 9:00 A M Medical Record Number: [id number] Patient Account Number: [id number] Date of Birth/Sex: Treating RN: 11-10-1950 (72 y.o. Harlow Ohms Primary Care Maverick Patman: [name] [name] Other Clinician: Referring Bri Wakeman: Treating Wylder Macomber/Extender: [name] [name] [name] Weeks in Treatment: 14 Active Problems Location of Pain Severity and Description of Pain Patient Has Paino Yes Site Locations Pain Location: Pain in Ulcers Duration of the Pain. Constant / Intermittento Constant Rate the pain. Current Pain Level: 2 Character of Pain Describe the Pain: Aching Pain Management and Medication Current Pain Management: Electronic Signature(s) Signed: 10/25/2022 4:24:03 PM By: Adline Peals Entered By: Adline Peals on 10/25/2022 09:14:28 -------------------------------------------------------------------------------- Patient/Caregiver Education Details Patient Name: Date of Service: Aleda Grana 10/26/2023andnbsp9:00 A M Medical Record Number: [id number] Patient Account Number: [id number] Date of Birth/Gender: Treating RN: 01/09/1950 (72 y.o. [name] Primary Care Physician: [name] [name] Other Clinician: Referring Physician: Treating Physician/Extender: [name] [name] [name] Weeks in Treatment: 14 Education Assessment Education Provided To: Patient Education Topics Provided ELLENA, KAMEN ([id number]) [id number] Page 6 of 9 Venous: Methods: Explain/Verbal Responses: Reinforcements needed, State content  correctly Electronic Signature(s) Signed: 10/25/2022 4:44:37 PM By: Rhae Hammock RN Entered By: Rhae Hammock on 10/25/2022 09:35:14 -------------------------------------------------------------------------------- Wound Assessment Details Patient Name: Date of Service: [name] Leyden, MA RIA N 10/25/2022 9:00 A M Medical Record Number: [id number] Patient Account Number: [id number] Date of Birth/Sex: Treating RN: 04/02/1950 (72 y.o. Harlow Ohms Primary Care Jayley Hustead: [name] [name] Other Clinician: Referring Jamarquis Crull: Treating Remi Lopata/Extender: [name] [name] [name] Weeks in Treatment: 14 Wound Status Wound Number: 1RR Primary Lymphedema Etiology: Wound Location: Left, Circumferential Lower Leg Wound Open Wounding Event: Gradually Appeared Status: Date Acquired: 07/05/2022 Comorbid Anemia, Arrhythmia, Hypertension, Type II Diabetes, Gout, Weeks Of Treatment: 14 History: Osteoarthritis, Received Radiation Clustered Wound: Yes Photos Wound Measurements Length: (cm) Width: (cm) Depth: (cm) Clustered Quantity: Area: (cm) Volume: (cm) 0 % Reduction in Area: 100% 0 % Reduction in Volume: 100% 0 Epithelialization: Large (67-100%) 5 Tunneling: No 0 Undermining: No 0 Wound Description Classification: Full Thickness Without Exposed Sup Wound Margin: Distinct, outline attached Exudate Amount: None Present port Structures Foul Odor After Cleansing: No Slough/Fibrino No Wound Bed Granulation Amount: None Present (0%) Exposed Structure Necrotic Amount: None Present (0%) Fascia Exposed: No Fat Layer (Subcutaneous Tissue) Exposed: No Tendon Exposed: No Muscle Exposed: No Joint Exposed: No Bone Exposed: No Periwound Skin Texture Texture Color No Abnormalities Noted: Yes No Abnormalities NotedREVIA, NGHIEM ([id number]) [id number] Page 7 of 9 Moisture Temperature / Pain No Abnormalities Noted:  No Temperature: No Abnormality Dry / Scaly: Yes Tenderness on Palpation: Yes Maceration: No Electronic Signature(s) Signed: 10/25/2022 4:24:03 PM By: Adline Peals Entered By: Adline Peals on 10/25/2022 09:28:18 -------------------------------------------------------------------------------- Wound  Assessment Details Patient Name: Date of Service: [name], [name] 10/25/2022 9:00 A M Medical Record Number: [id number] Patient Account Number: [id number] Date of Birth/Sex: Treating RN: 1950/04/06 (72 y.o. Harlow Ohms Primary Care Hank Walling: [name] [name] Other Clinician: Referring Giavanni Zeitlin: Treating Prem Coykendall/Extender: [name] [name] [name] Weeks in Treatment: 14 Wound Status Wound Number: 2 Primary Lymphedema Etiology: Wound Location: Right, Circumferential Lower Leg Wound Open Wounding Event: Gradually Appeared Status: Date Acquired: 07/05/2022 Comorbid Anemia, Arrhythmia, Hypertension, Type II Diabetes, Gout, Weeks Of Treatment: 14 History: Osteoarthritis, Received Radiation Clustered Wound: Yes Photos Wound Measurements Length: (cm) Width: (cm) Depth: (cm) Clustered Quantity: Area: (cm) Volume: (cm) 0.1 % Reduction in Area: 100% 0.1 % Reduction in Volume: 100% 0.1 Epithelialization: Large (67-100%) 10 Tunneling: No 0.008 Undermining: No 0.001 Wound Description Classification: Full Thickness Without Exposed Sup Wound Margin: Distinct, outline attached Exudate Amount: Small Exudate Type: Serous Exudate Color: amber port Structures Foul Odor After Cleansing: No Slough/Fibrino No Wound Bed Granulation Amount: Large (67-100%) Exposed Structure Granulation Quality: Red Fascia Exposed: No Necrotic Amount: None Present (0%) Fat Layer (Subcutaneous Tissue) Exposed: Yes Tendon Exposed: No Muscle Exposed: No Joint Exposed: No Bone Exposed: No Periwound Skin Texture [place], Jinny Sanders ([id number])  [id number] Page 8 of 9 Texture Color No Abnormalities Noted: Yes No Abnormalities Noted: No Atrophie Blanche: No Moisture Cyanosis: No No Abnormalities Noted: No Ecchymosis: No Dry / Scaly: Yes Erythema: No Maceration: No Hemosiderin Staining: Yes Mottled: No Pallor: No Rubor: No Temperature / Pain Temperature: No Abnormality Treatment Notes Wound #2 (Lower Leg) Wound Laterality: Right, Circumferential Cleanser Peri-Wound Care Sween Lotion (Moisturizing lotion) Discharge Instruction: Apply moisturizing lotion as directed Topical Primary Dressing KerraCel Ag Gelling Fiber Dressing, 4x5 in (silver alginate) Discharge Instruction: Apply silver alginate to wound bed as instructed Secondary Dressing Woven Gauze Sponge, Non-Sterile 4x4 in Discharge Instruction: Apply over primary dressing as directed. Secured With Compression Wrap FourPress (4 layer compression wrap) Discharge Instruction: Apply four layer compression as directed. use first layer unna boot to upper portion of lower legs to secure wrap in place. unna wrap at top of leg Compression Stockings Add-Ons Electronic Signature(s) Signed: 10/25/2022 4:24:03 PM By: Adline Peals Entered By: Adline Peals on 10/25/2022 09:28:40 -------------------------------------------------------------------------------- Vitals Details Patient Name: Date of Service: [name] Leyden, MA RIA N 10/25/2022 9:00 A M Medical Record Number: [id number] Patient Account Number: [id number] Date of Birth/Sex: Treating RN: [birth year] (72 y.o. Harlow Ohms Primary Care Nathon Stefanski: [name] [name] Other Clinician: Referring Zackary Mckeone: Treating Mikie Misner/Extender: [name] [name] [name] Weeks in Treatment: 14 Vital Signs Time Taken: 09:14 Temperature (F): 98 Height (in): 64 Pulse (bpm): 87 Weight (lbs): 325 Respiratory Rate (breaths/min): 18 Body Mass Index (BMI): 55.8 Blood  Pressure (mmHg): 109/67 Reference Range: 80 - 120 mg / dl Electronic Signature(s) Signed: 10/25/2022 4:24:03 PM By: Sheral Apley, Jinny Sanders ([id number]) [id number] Page 9 of 9 Entered By: Adline Peals on 10/25/2022 09:14:44

## 2022-10-25 NOTE — Progress Notes (Signed)
CAMRYN, LAMPSON ([id number]) [id number] Page 1 of 8 Visit Report for 10/25/2022 Chief Complaint Document Details Patient Name: Date of Service: [name], [name] [name] 10/25/2022 9:00 A M Medical Record Number: [id number] Patient Account Number: [id number] Date of Birth/Sex: Treating RN: 1950-08-07 (72 y.o. [name] Primary Care Provider: MEDINA-V A Jeri [place], [name] Other Clinician: Referring Provider: Treating Provider/Extender: [name] [name], [name] Weeks in Treatment: 14 Information Obtained from: Patient Chief Complaint 07/19/2022; bilateral lower extremity wounds Electronic Signature(s) Signed: 10/25/2022 10:11:52 AM By: [name] DO Entered By: [name] on 10/25/2022 09:45:55 -------------------------------------------------------------------------------- HPI Details Patient Name: Date of Service: [name] Leyden, [place] RIA N 10/25/2022 9:00 A M Medical Record Number: [id number] Patient Account Number: [id number] Date of Birth/Sex: Treating RN: Oct 19, 1950 (72 y.o. [name] Primary Care Provider: [name], [name] Other Clinician: Referring Provider: Treating Provider/Extender: [name] [name], [name] Weeks in Treatment: 14 History of Present Illness HPI Description: Admission 07/19/2022 [name] Is a 72 year old female with a past medical history of endometrial cancer, lymphedema, insulin-dependent type 2 diabetes that presents to the clinic for a 4-week history of weeping to her lower extremities bilaterally. She visited the ED for this issue on 07/07/2022 and she was prescribed doxycycline For bilateral lower extremity cellulitis. She has completed this course. She currently denies signs of infection. She does not have compression stockings. She states she recently gained weight over the past couple months. She has never had wounds before. 7/28; patient presents for follow-up. We have been using  silver alginate under Kerlix/Coban. She is scheduled to get her ABIs with TBI's on 8/7. She states that the wraps slid down to her mid shin. She denies signs of infection. 8/7; patient presents for follow-up. We have been using silver alginate under Kerlix/Coban. She had arterial studies done that showed an ABI of 0.86 on the right and 0.82 on the left with multiphasic waveforms throughout the lower extremities. 8/14; patient presents for follow-up. We have been using silver alginate under 3 layer compression. She tolerated the wrap well. She has no issues or complaints today. 8/21; patient presents for follow-up. We have been using silver alginate under 3 layer compression. She has no issues or complaints today. She has her juxta lite compressions with her. 8/28; patient presents for follow-up. We have been using silver alginate under 3 layer compression. She has no issues or complaints today. 9/5; patient presents for follow-up. We have been using silver alginate under 4 layer compression to both extremities bilaterally. She has her juxta lite compression with her today. 9/12; patient presents for follow-up. We have been using silver alginate under 4-layer compression to the right leg. She was supposed to wear her juxta lite compression to the left lower extremity. She states she could not get it on so she has not been wearing it. She also has not been taking her Lasix. 9/19; patient presents for follow-up. We have been using silver alginate under 4-layer compression to her lower extremities bilaterally. She has restarted taking her Lasix on a daily basis. 9/26; patient presents for follow-up. We have been using silver alginate under 4-layer compression to the lower extremities bilaterally. Her left lower extremity wounds have healed. She has juxta lite compressions. BRAELYN, JENSON ([id number]) [id number] Page 2 of 8 10/12; patient presents for follow-up. Patient had  COVID last week and missed her appointment. She states she took her compression wrap off on the right lower extremity.  Her wounds were healed on the left lower extremity at last clinic visit but these have since reopened. She has juxta lite compression and states she has been wearing them. Unfortunately she has multiple scattered wounds now to her right lower extremity as well. No signs of infection on exam. We have offered to order her lymphedema pumps however she states she wants to use her husband's. She has not been doing this. Her diuretic is 60 mg of Lasix twice weekly. She has not been contacted for her venous reflux studies. 10/19; patient presents for follow-up. She is now on 80 mg of Lasix twice daily. She reports significant improvement in her leg swelling. Her wounds have improved greatly. We have been using silver alginate under 4-layer compression. The left leg is healed. She does not have her Velcro compression wrap with her. She has 2 open wounds to the right lower extremity limited to skin breakdown with minimal weeping. She denies signs of infection. 10/26; patient presents for follow-up. The left lower extremity wounds Remain closed. Her right lower extremity still has some scattered open wounds limited to skin breakdown. The wrap slid down slightly to the right lower extremity. She is now taking 80 mg of Lasix in the morning and '40mg'$  in the afternoon. Electronic Signature(s) Signed: 10/25/2022 10:11:52 AM By: [name] DO Entered By: [name] on 10/25/2022 09:54:18 -------------------------------------------------------------------------------- Physical Exam Details Patient Name: Date of Service: [name] Leyden, [place] RIA N 10/25/2022 9:00 A M Medical Record Number: [id number] Patient Account Number: [id number] Date of Birth/Sex: Treating RN: 1950/05/06 (72 y.o. [name] Primary Care Provider: [name], [name] Other Clinician: Referring Provider: Treating  Provider/Extender: [name] [name], [name] Weeks in Treatment: 14 Constitutional respirations regular, non-labored and within target range for patient.. Cardiovascular 2+ dorsalis pedis/posterior tibialis pulses. Psychiatric pleasant and cooperative. Notes Left lower extremity: Epithelization to the previous wound sites. Good edema control. No open wounds or weeping noted. Right lower extremity: a few open wound limited to skin breakdown with minimal weeping. 2+ pitting edema to the knees bilaterally Electronic Signature(s) Signed: 10/25/2022 10:11:52 AM By: [name] DO Entered By: [name] on 10/25/2022 09:53:56 -------------------------------------------------------------------------------- Physician Orders Details Patient Name: Date of Service: [name] [place]yden, [place] RIA N 10/25/2022 9:00 A M Medical Record Number: [id number] Patient Account Number: [id number] Date of Birth/Sex: Treating RN: 03-27-1950 (72 y.o. [name] Primary Care Provider: [name], [name] Other Clinician: Referring Provider: Treating Provider/Extender: [name] [name], [name] Weeks in Treatment: 14 Verbal / Phone Orders: No Diagnosis Coding Follow-up Appointments ppointment in 1 week. - Dr. Heber  Return A Other: - bring in compression stockings next week. BRUNA, DILLS ([id number]) [id number] Page 3 of 8 Bathing/ Shower/ Hygiene May shower with protection but do not get wound dressing(s) wet. Edema Control - Lymphedema / SCD / Other Lymphedema Pumps. Use Lymphedema pumps on leg(s) 2-3 times a day for 45-60 minutes. If wearing any wraps or hose, do not remove them. Continue exercising as instructed. - 2-3 times a day throughout the day. Elevate legs to the level of the heart or above for 30 minutes daily and/or when sitting, a frequency of: - 3-4 times a day throughout the day. Avoid standing for long periods of  time. Patient to wear own compression stockings every day. - on left leg right now; still wrapping right leg; apply in the morning and remove at night Exercise regularly Wound Treatment Wound #2 -  Lower Leg Wound Laterality: Right, Circumferential Peri-Wound Care: Sween Lotion (Moisturizing lotion) 1 x Per Week/30 Days Discharge Instructions: Apply moisturizing lotion as directed Prim Dressing: KerraCel Ag Gelling Fiber Dressing, 4x5 in (silver alginate) 1 x Per Week/30 Days ary Discharge Instructions: Apply silver alginate to wound bed as instructed Secondary Dressing: Woven Gauze Sponge, Non-Sterile 4x4 in 1 x Per Week/30 Days Discharge Instructions: Apply over primary dressing as directed. Compression Wrap: FourPress (4 layer compression wrap) 1 x Per Week/30 Days Discharge Instructions: Apply four layer compression as directed. use first layer unna boot to upper portion of lower legs to secure wrap in place. Compression Wrap: unna wrap at top of leg 1 x Per Week/30 Days Electronic Signature(s) Signed: 10/25/2022 10:11:52 AM By: [name] DO Signed: 10/25/2022 4:44:37 PM By: Rhae Hammock RN Entered By: Rhae Hammock on 10/25/2022 09:46:00 -------------------------------------------------------------------------------- Problem List Details Patient Name: Date of Service: [name], [name] RIA N 10/25/2022 9:00 A M Medical Record Number: [id number] Patient Account Number: [id number] Date of Birth/Sex: Treating RN: [birth year] (72 y.o. [name] Primary Care Provider: [name], [name] Other Clinician: Referring Provider: Treating Provider/Extender: [name] [name], [name] Weeks in Treatment: 14 Active Problems ICD-10 Encounter Code Description Active Date MDM Diagnosis L97.812 Non-pressure chronic ulcer of other part of right lower leg with fat layer 07/19/2022 No Yes exposed L97.822 Non-pressure chronic ulcer of other part of left lower leg  with fat layer exposed7/20/2023 No Yes I89.0 Lymphedema, not elsewhere classified 07/19/2022 No Yes I87.333 Chronic venous hypertension (idiopathic) with ulcer and inflammation of 07/19/2022 No Yes bilateral lower extremity E11.622 Type 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 07/19/2022 No Yes [place], Jinny Sanders ([id number]) [id number] Page 4 of 8 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 07/19/2022 No Yes Inactive Problems Resolved Problems Electronic Signature(s) Signed: 10/25/2022 10:11:52 AM By: [name] DO Entered By: [name] on 10/25/2022 09:45:33 -------------------------------------------------------------------------------- Progress Note Details Patient Name: Date of Service: [name] Leyden, [place] RIA N 10/25/2022 9:00 A M Medical Record Number: [id number] Patient Account Number: [id number] Date of Birth/Sex: Treating RN: 1950/06/17 (72 y.o. [name] Primary Care Provider: [name], [name] Other Clinician: Referring Provider: Treating Provider/Extender: [name] [name], [name] Weeks in Treatment: 14 Subjective Chief Complaint Information obtained from Patient 07/19/2022; bilateral lower extremity wounds History of Present Illness (HPI) Admission 07/19/2022 Ms. Milderd Manocchio Is a 72 year old female with a past medical history of endometrial cancer, lymphedema, insulin-dependent type 2 diabetes that presents to the clinic for a 4-week history of weeping to her lower extremities bilaterally. She visited the ED for this issue on 07/07/2022 and she was prescribed doxycycline For bilateral lower extremity cellulitis. She has completed this course. She currently denies signs of infection. She does not have compression stockings. She states she recently gained weight over the past couple months. She has never had wounds before. 7/28; patient presents for follow-up. We have been using silver alginate under Kerlix/Coban. She is  scheduled to get her ABIs with TBI's on 8/7. She states that the wraps slid down to her mid shin. She denies signs of infection. 8/7; patient presents for follow-up. We have been using silver alginate under Kerlix/Coban. She had arterial studies done that showed an ABI of 0.86 on the right and 0.82 on the left with multiphasic waveforms throughout the lower extremities. 8/14; patient presents for follow-up. We have been using silver alginate under 3 layer compression. She tolerated the wrap well. She  has no issues or complaints today. 8/21; patient presents for follow-up. We have been using silver alginate under 3 layer compression. She has no issues or complaints today. She has her juxta lite compressions with her. 8/28; patient presents for follow-up. We have been using silver alginate under 3 layer compression. She has no issues or complaints today. 9/5; patient presents for follow-up. We have been using silver alginate under 4 layer compression to both extremities bilaterally. She has her juxta lite compression with her today. 9/12; patient presents for follow-up. We have been using silver alginate under 4-layer compression to the right leg. She was supposed to wear her juxta lite compression to the left lower extremity. She states she could not get it on so she has not been wearing it. She also has not been taking her Lasix. 9/19; patient presents for follow-up. We have been using silver alginate under 4-layer compression to her lower extremities bilaterally. She has restarted taking her Lasix on a daily basis. 9/26; patient presents for follow-up. We have been using silver alginate under 4-layer compression to the lower extremities bilaterally. Her left lower extremity wounds have healed. She has juxta lite compressions. 10/12; patient presents for follow-up. Patient had COVID last week and missed her appointment. She states she took her compression wrap off on the right lower extremity. Her  wounds were healed on the left lower extremity at last clinic visit but these have since reopened. She has juxta lite compression and states she has been wearing them. Unfortunately she has multiple scattered wounds now to her right lower extremity as well. No signs of infection on exam. We have offered to order her lymphedema pumps however she states she wants to use her husband's. She has not been doing this. Her diuretic is 60 mg of Lasix twice weekly. She has not been contacted for her venous reflux studies. 10/19; patient presents for follow-up. She is now on 80 mg of Lasix twice daily. She reports significant improvement in her leg swelling. Her wounds have improved greatly. We have been using silver alginate under 4-layer compression. The left leg is healed. She does not have her Velcro compression wrap with her. She has 2 open wounds to the right lower extremity limited to skin breakdown with minimal weeping. She denies signs of infection. 10/26; patient presents for follow-up. The left lower extremity wounds Remain closed. Her right lower extremity still has some scattered open wounds limited to [place], Jinny Sanders ([id number]) [id number] Page 5 of 8 skin breakdown. The wrap slid down slightly to the right lower extremity. She is now taking 80 mg of Lasix in the morning and '40mg'$  in the afternoon. Patient History Information obtained from Patient, Chart. Family History Cancer - Father, Diabetes - Siblings. Social History Former smoker - quit 1988, Marital Status - Married, Alcohol Use - Moderate - cocktails x2 a week, Drug Use - No History, Caffeine Use - Never. Medical History Hematologic/Lymphatic Patient has history of Anemia - iron Cardiovascular Patient has history of Arrhythmia - A. Fib 09/10/2022 dx started eliquis, Hypertension Endocrine Patient has history of Type II Diabetes Musculoskeletal Patient has history of Gout, Osteoarthritis Oncologic Patient  has history of Received Radiation - 2023 Hospitalization/Surgery History - one ovary removed 2020. - pilonidal cyst extraction 1969. - diverculitis temp colostomy. Medical A Surgical History Notes nd Cardiovascular hyperlipidemia Gastrointestinal GERD Diverticulitis Musculoskeletal carpel tunnel bilateral-surgery to right hand Oncologic endometrial cancer skin Ca Objective Constitutional respirations regular, non-labored and within target range for patient.. Vitals Time  Taken: 9:14 AM, Height: 64 in, Weight: 325 lbs, BMI: 55.8, Temperature: 98 F, Pulse: 87 bpm, Respiratory Rate: 18 breaths/min, Blood Pressure: 109/67 mmHg. Cardiovascular 2+ dorsalis pedis/posterior tibialis pulses. Psychiatric pleasant and cooperative. General Notes: Left lower extremity: Epithelization to the previous wound sites. Good edema control. No open wounds or weeping noted. Right lower extremity: a few open wound limited to skin breakdown with minimal weeping. 2+ pitting edema to the knees bilaterally Integumentary (Hair, Skin) Wound #1RR status is Open. Original cause of wound was Gradually Appeared. The date acquired was: 07/05/2022. The wound has been in treatment 14 weeks. The wound is located on the Left,Circumferential Lower Leg. The wound measures 0cm length x 0cm width x 0cm depth; 0cm^2 area and 0cm^3 volume. There is no tunneling or undermining noted. There is a none present amount of drainage noted. The wound margin is distinct with the outline attached to the wound base. There is no granulation within the wound bed. There is no necrotic tissue within the wound bed. The periwound skin appearance had no abnormalities noted for texture. The periwound skin appearance had no abnormalities noted for color. The periwound skin appearance exhibited: Dry/Scaly. The periwound skin appearance did not exhibit: Maceration. Periwound temperature was noted as No Abnormality. The periwound has tenderness on  palpation. Wound #2 status is Open. Original cause of wound was Gradually Appeared. The date acquired was: 07/05/2022. The wound has been in treatment 14 weeks. The wound is located on the Right,Circumferential Lower Leg. The wound measures 0.1cm length x 0.1cm width x 0.1cm depth; 0.008cm^2 area and 0.001cm^3 volume. There is Fat Layer (Subcutaneous Tissue) exposed. There is no tunneling or undermining noted. There is a small amount of serous drainage noted. The wound margin is distinct with the outline attached to the wound base. There is large (67-100%) red granulation within the wound bed. There is no necrotic tissue within the wound bed. The periwound skin appearance had no abnormalities noted for texture. The periwound skin appearance exhibited: Dry/Scaly, Hemosiderin Staining. The periwound skin appearance did not exhibit: Maceration, Atrophie Blanche, Cyanosis, Ecchymosis, Mottled, Pallor, Rubor, Erythema. Periwound temperature was noted as No Abnormality. Assessment Active Problems ICD-10 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d [place], Jinny Sanders ([id number]) [phone].pdf Page 6 of 8 Non-pressure chronic ulcer of other part of left lower leg with fat layer exposed Lymphedema, not elsewhere classified Chronic venous hypertension (idiopathic) with ulcer and inflammation of bilateral lower extremity Type 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 Malignant neoplasm of endometrium Patient's left lower extremity wounds are healed. I recommended juxta light compression daily to this leg. She still has a few scattered open wounds to the right lower extremity limited to skin breakdown. I recommended continuing the course with silver alginate under 4-layer compression. Follow-up in 1 week. Procedures Wound #2 Pre-procedure diagnosis of Wound #2 is a Lymphedema located on the Right,Circumferential Lower Leg . There was a Four Layer  Compression Therapy Procedure by Rhae Hammock, RN. Post procedure Diagnosis Wound #2: Same as Pre-Procedure Plan Follow-up Appointments: Return Appointment in 1 week. - Dr. Heber Allen Other: - bring in compression stockings next week. Bathing/ Shower/ Hygiene: May shower with protection but do not get wound dressing(s) wet. Edema Control - Lymphedema / SCD / Other: Lymphedema Pumps. Use Lymphedema pumps on leg(s) 2-3 times a day for 45-60 minutes. If wearing any wraps or hose, do not remove them. Continue exercising as instructed. - 2-3 times a day throughout the  day. Elevate legs to the level of the heart or above for 30 minutes daily and/or when sitting, a frequency of: - 3-4 times a day throughout the day. Avoid standing for long periods of time. Patient to wear own compression stockings every day. - on left leg right now; still wrapping right leg; apply in the morning and remove at night Exercise regularly WOUND #2: - Lower Leg Wound Laterality: Right, Circumferential Peri-Wound Care: Sween Lotion (Moisturizing lotion) 1 x Per Week/30 Days Discharge Instructions: Apply moisturizing lotion as directed Prim Dressing: KerraCel Ag Gelling Fiber Dressing, 4x5 in (silver alginate) 1 x Per Week/30 Days ary Discharge Instructions: Apply silver alginate to wound bed as instructed Secondary Dressing: Woven Gauze Sponge, Non-Sterile 4x4 in 1 x Per Week/30 Days Discharge Instructions: Apply over primary dressing as directed. Com pression Wrap: FourPress (4 layer compression wrap) 1 x Per Week/30 Days Discharge Instructions: Apply four layer compression as directed. use first layer unna boot to upper portion of lower legs to secure wrap in place. Com pression Wrap: unna wrap at top of leg 1 x Per Week/30 Days 1. Silver alginate under 4-layer compressionooright lower extremity 2. Juxta light compression dailyooleft lower extremity 3. Follow-up in 1 week Electronic Signature(s) Signed:  10/25/2022 10:11:52 AM By: [name] DO Entered By: [name] on 10/25/2022 09:55:20 -------------------------------------------------------------------------------- HxROS Details Patient Name: Date of Service: [name] Leyden, [place] RIA N 10/25/2022 9:00 A M Medical Record Number: [id number] Patient Account Number: [id number] Date of Birth/Sex: Treating RN: [birth year] (72 y.o. [name] Primary Care Provider: MEDINA-V A Jeri [place], [name] Other Clinician: Referring Provider: Treating Provider/Extender: [name] [name], [name] Weeks in Treatment: 14 Information Obtained From Patient KAMEELA, LEIPOLD ([id number]) [id number] Page 7 of 8 Hematologic/Lymphatic Medical History: Positive for: Anemia - iron Cardiovascular Medical History: Positive for: Arrhythmia - A. Fib 09/10/2022 dx started eliquis; Hypertension Past Medical History Notes: hyperlipidemia Gastrointestinal Medical History: Past Medical History Notes: GERD Diverticulitis Endocrine Medical History: Positive for: Type II Diabetes Time with diabetes: 72 years old Treated with: Insulin, Diet Blood sugar tested every day: Yes Tested : three times a week. Musculoskeletal Medical History: Positive for: Gout; Osteoarthritis Past Medical History Notes: carpel tunnel bilateral-surgery to right hand Oncologic Medical History: Positive for: Received Radiation - 2023 Past Medical History Notes: endometrial cancer skin Ca Immunizations Pneumococcal Vaccine: Received Pneumococcal Vaccination: Yes Received Pneumococcal Vaccination On or After 60th Birthday: No Implantable Devices No devices added Hospitalization / Surgery History Type of Hospitalization/Surgery one ovary removed 2020 pilonidal cyst extraction 1969 diverculitis temp colostomy Family and Social History Cancer: Yes - Father; Diabetes: Yes - Siblings; Former smoker - quit 1988; Marital Status -  Married; Alcohol Use: Moderate - cocktails x2 a week; Drug Use: No History; Caffeine Use: Never; Financial Concerns: No; Food, Clothing or Shelter Needs: No; Support System Lacking: No; Transportation Concerns: No Electronic Signature(s) Signed: 10/25/2022 10:11:52 AM By: [name] DO Signed: 10/25/2022 5:00:15 PM By: [name] RN, BSN Entered By: [name] on 10/25/2022 09:53:03 -------------------------------------------------------------------------------- SuperBill Details Patient Name: Date of Service: DEBANHI, BLAKER 10/25/2022 [name] ([id number]) [id number] Page 8 of 8 Medical Record Number: [id number] Patient Account Number: [id number] Date of Birth/Sex: Treating RN: September 23, 1950 (72 y.o. [name] Primary Care Provider: [name], [name] Other Clinician: Referring Provider: Treating Provider/Extender: [name] [name], [name] Weeks in Treatment: 14 Diagnosis Coding ICD-10 Codes Code Description [phone] Non-pressure chronic ulcer of other  part of right lower leg with fat layer exposed L97.822 Non-pressure chronic ulcer of other part of left lower leg with fat layer exposed I89.0 Lymphedema, not elsewhere classified I87.333 Chronic venous hypertension (idiopathic) with ulcer and inflammation of bilateral lower extremity E11.622 Type 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 Facility Procedures : CPT4 Code: [id number] Description: (Facility Use Only) 38381MM - APPLY MULTLAY COMPRS LWR RT LEG Modifier: Quantity: 1 Physician Procedures : CPT4 Code Description Modifier [id number] - WC PHYS LEVEL 3 - EST PT ICD-10 Diagnosis Description H40.352 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d L97.822 Non-pressure chronic ulcer of other part of left  lower leg with fat layer exposed I89.0 Lymphedema, not elsewhere classified  I87.333 Chronic venous hypertension (idiopathic) with ulcer and inflammation of bilateral lower extremity Quantity: 1 Electronic Signature(s) Signed: 10/25/2022 10:11:52 AM By: [name] DO Entered By: [name] on 10/25/2022 09:56:12

## 2022-10-30 DIAGNOSIS — C541 Malignant neoplasm of endometrium: Secondary | ICD-10-CM | POA: Diagnosis not present

## 2022-10-30 DIAGNOSIS — Z01419 Encounter for gynecological examination (general) (routine) without abnormal findings: Secondary | ICD-10-CM | POA: Diagnosis not present

## 2022-10-30 DIAGNOSIS — Z1231 Encounter for screening mammogram for malignant neoplasm of breast: Secondary | ICD-10-CM | POA: Diagnosis not present

## 2022-10-31 ENCOUNTER — Other Ambulatory Visit: Payer: Medicare Other

## 2022-11-01 ENCOUNTER — Encounter (HOSPITAL_BASED_OUTPATIENT_CLINIC_OR_DEPARTMENT_OTHER): Payer: Medicare Other | Attending: Internal Medicine | Admitting: Internal Medicine

## 2022-11-01 ENCOUNTER — Ambulatory Visit: Payer: Medicare Other | Attending: Internal Medicine

## 2022-11-01 DIAGNOSIS — I10 Essential (primary) hypertension: Secondary | ICD-10-CM | POA: Diagnosis not present

## 2022-11-01 DIAGNOSIS — Z794 Long term (current) use of insulin: Secondary | ICD-10-CM | POA: Insufficient documentation

## 2022-11-01 DIAGNOSIS — L97812 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d: Secondary | ICD-10-CM | POA: Insufficient documentation

## 2022-11-01 DIAGNOSIS — L97822 Non-pressure chronic ulcer of other part of left lower leg with fat layer exposed: Secondary | ICD-10-CM | POA: Insufficient documentation

## 2022-11-01 DIAGNOSIS — S81801A Unspecified open wound, right lower leg, initial encounter: Secondary | ICD-10-CM | POA: Diagnosis not present

## 2022-11-01 DIAGNOSIS — I87333 Chronic venous hypertension (idiopathic) with ulcer and inflammation of bilateral lower extremity: Secondary | ICD-10-CM | POA: Diagnosis not present

## 2022-11-01 DIAGNOSIS — C541 Malignant neoplasm of endometrium: Secondary | ICD-10-CM | POA: Diagnosis not present

## 2022-11-01 DIAGNOSIS — I890 Lymphedema, not elsewhere classified: Secondary | ICD-10-CM | POA: Diagnosis not present

## 2022-11-01 DIAGNOSIS — E11622 Type 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: Secondary | ICD-10-CM | POA: Insufficient documentation

## 2022-11-01 DIAGNOSIS — S81802A Unspecified open wound, left lower leg, initial encounter: Secondary | ICD-10-CM | POA: Diagnosis not present

## 2022-11-01 DIAGNOSIS — Z79899 Other long term (current) drug therapy: Secondary | ICD-10-CM

## 2022-11-02 LAB — BASIC METABOLIC PANEL
BUN/Creatinine Ratio: 23 (ref 12–28)
BUN: 31 mg/dL — ABNORMAL HIGH (ref 8–27)
CO2: 28 mmol/L (ref 20–29)
Calcium: 10 mg/dL (ref 8.7–10.3)
Chloride: 99 mmol/L (ref 96–106)
Creatinine, Ser: 1.37 mg/dL — ABNORMAL HIGH (ref 0.57–1.00)
Glucose: 207 mg/dL — ABNORMAL HIGH (ref 70–99)
Potassium: 4.9 mmol/L (ref 3.5–5.2)
Sodium: 139 mmol/L (ref 134–144)
eGFR: 41 mL/min/{1.73_m2} — ABNORMAL LOW (ref >59)

## 2022-11-02 LAB — PRO B NATRIURETIC PEPTIDE: NT-Pro BNP: 1748 pg/mL — ABNORMAL HIGH (ref 0–301)

## 2022-11-02 NOTE — Progress Notes (Signed)
AHRIA, SLAPPEY ([id number]) [id number] Page 1 of 7 Visit Report for 11/01/2022 HPI Details Patient Name: Date of Service: [name], [name] [name] 11/01/2022 12:45 PM Medical Record Number: [id number] Patient Account Number: [id number] Date of Birth/Sex: Treating RN: 1950-03-07 (72 y.o. [name] Primary Care Provider: [facility] Jeri [place], [name] Other Clinician: Referring Provider: Treating Provider/Extender: Linton Ham [name], [name] Weeks in Treatment: 15 History of Present Illness HPI Description: Admission 07/19/2022 Ms. Enya Bureau Is a 72 year old female with a past medical history of endometrial cancer, lymphedema, insulin-dependent type 2 diabetes that presents to the clinic for a 4-week history of weeping to her lower extremities bilaterally. She visited the ED for this issue on 07/07/2022 and she was prescribed doxycycline For bilateral lower extremity cellulitis. She has completed this course. She currently denies signs of infection. She does not have compression stockings. She states she recently gained weight over the past couple months. She has never had wounds before. 7/28; patient presents for follow-up. We have been using silver alginate under Kerlix/Coban. She is scheduled to get her ABIs with TBI's on 8/7. She states that the wraps slid down to her mid shin. She denies signs of infection. 8/7; patient presents for follow-up. We have been using silver alginate under Kerlix/Coban. She had arterial studies done that showed an ABI of 0.86 on the right and 0.82 on the left with multiphasic waveforms throughout the lower extremities. 8/14; patient presents for follow-up. We have been using silver alginate under 3 layer compression. She tolerated the wrap well. She has no issues or complaints today. 8/21; patient presents for follow-up. We have been using silver alginate under 3 layer compression. She has no issues or complaints today. She has her  juxta lite compressions with her. 8/28; patient presents for follow-up. We have been using silver alginate under 3 layer compression. She has no issues or complaints today. 9/5; patient presents for follow-up. We have been using silver alginate under 4 layer compression to both extremities bilaterally. She has her juxta lite compression with her today. 9/12; patient presents for follow-up. We have been using silver alginate under 4-layer compression to the right leg. She was supposed to wear her juxta lite compression to the left lower extremity. She states she could not get it on so she has not been wearing it. She also has not been taking her Lasix. 9/19; patient presents for follow-up. We have been using silver alginate under 4-layer compression to her lower extremities bilaterally. She has restarted taking her Lasix on a daily basis. 9/26; patient presents for follow-up. We have been using silver alginate under 4-layer compression to the lower extremities bilaterally. Her left lower extremity wounds have healed. She has juxta lite compressions. 10/12; patient presents for follow-up. Patient had COVID last week and missed her appointment. She states she took her compression wrap off on the right lower extremity. Her wounds were healed on the left lower extremity at last clinic visit but these have since reopened. She has juxta lite compression and states she has been wearing them. Unfortunately she has multiple scattered wounds now to her right lower extremity as well. No signs of infection on exam. We have offered to order her lymphedema pumps however she states she wants to use her husband's. She has not been doing this. Her diuretic is 60 mg of Lasix twice weekly. She has not been contacted for her venous reflux studies. 10/19; patient presents for follow-up. She is now on 8  mg of Lasix twice daily. She reports significant improvement in her leg swelling. Her wounds have improved greatly. We  have been using silver alginate under 4-layer compression. The left leg is healed. She does not have her Velcro compression wrap with her. She has 2 open wounds to the right lower extremity limited to skin breakdown with minimal weeping. She denies signs of infection. 10/26; patient presents for follow-up. The left lower extremity wounds Remain closed. Her right lower extremity still has some scattered open wounds limited to skin breakdown. The wrap slid down slightly to the right lower extremity. She is now taking 80 mg of Lasix in the morning and '40mg'$  in the afternoon. 11/2; the patient was healed out on the left last visit. She did not have a JuxtaLite which she did have was a stocking from [employer]. She used this however predictably the leg is broken down in the left anterior and 2 small areas in the left calf. The right leg really does not look too bad. Electronic Signature(s) Signed: 11/01/2022 4:03:30 PM By: Linton Ham MD Entered By: Linton Ham on 11/01/2022 13:39:58 [name] ([id number]) [id number] Page 2 of 7 -------------------------------------------------------------------------------- Physical Exam Details Patient Name: Date of Service: HEATH, BADON 11/01/2022 12:45 PM Medical Record Number: [id number] Patient Account Number: [id number] Date of Birth/Sex: Treating RN: 05/24/1950 (72 y.o. [name] Primary Care Provider: [name], [name] Other Clinician: Referring Provider: Treating Provider/Extender: Linton Ham [name], [name] Weeks in Treatment: 15 Constitutional Sitting or standing Blood Pressure is within target range for patient.. Pulse regular and within target range for patient.Marland Kitchen Respirations regular, non-labored and within target range.. Temperature is normal and within the target range for the patient.Marland Kitchen Appears in no distress. Notes Wound exam; left lower extremity superficial wounds which were new  this week on the left anterior tibial area and 2 small areas on the left posterior calf. None of these have any depth and they do not appear ominous however the edema control in her left leg is very poor there is stasis type changes in a band in the mid part of the leg Right lower extremity the area actually looks quite good anteriorly Electronic Signature(s) Signed: 11/01/2022 4:03:30 PM By: Linton Ham MD Entered By: Linton Ham on 11/01/2022 13:41:01 -------------------------------------------------------------------------------- Physician Orders Details Patient Name: Date of Service: [name] 11/01/2022 12:45 PM Medical Record Number: [id number] Patient Account Number: [id number] Date of Birth/Sex: Treating RN: [birth year] (72 y.o. Harlow Ohms Primary Care Provider: [name], [name] Other Clinician: Referring Provider: Treating Provider/Extender: Linton Ham [name], [name] Weeks in Treatment: 15 Verbal / Phone Orders: No Diagnosis Coding ICD-10 Coding Code Description [phone] 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d L97.822 Non-pressure chronic ulcer of other part of left lower leg with fat layer exposed I89.0 Lymphedema, not elsewhere classified I87.333 Chronic venous hypertension (idiopathic) with ulcer and inflammation of bilateral lower extremity E11.622 Type 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 Follow-up Appointments ppointment in 2 weeks. - Dr. Heber Garrison Return A Nurse Visit: - next week Other: - bring in compression stockings next week. Bathing/ Shower/ Hygiene May shower with protection but do not get wound dressing(s) wet. Edema Control - Lymphedema / SCD / Other Lymphedema Pumps. Use Lymphedema pumps on leg(s) 2-3 times a day for 45-60 minutes. If wearing any wraps or hose, do not  remove them. Continue exercising as instructed. - 2-3 times  a day throughout the day. Elevate legs to the level of the heart or above for 30 minutes daily and/or when sitting, a frequency of: - 3-4 times a day throughout the day. Avoid standing for long periods of time. Patient to wear own compression stockings every day. - on left leg right now; still wrapping right leg; apply in the morning and remove at night Exercise regularly Wound Treatment Wound #1RR - Lower Leg Wound Laterality: Left, Circumferential Peri-Wound Care: Triamcinolone 15 (g) 1 x Per Week/30 Days Discharge Instructions: Use triamcinolone 15 (g) as directed Peri-Wound Care: Sween Lotion (Moisturizing lotion) 1 x Per Week/30 Days MINETTE, MANDERS ([id number]) [id number] Page 3 of 7 Discharge Instructions: Apply moisturizing lotion as directed Prim Dressing: KerraCel Ag Gelling Fiber Dressing, 4x5 in (silver alginate) 1 x Per Week/30 Days ary Discharge Instructions: Apply silver alginate to wound bed as instructed Secondary Dressing: ABD Pad, 5x9 1 x Per Week/30 Days Discharge Instructions: Apply over primary dressing as directed. Secondary Dressing: Woven Gauze Sponge, Non-Sterile 4x4 in 1 x Per Week/30 Days Discharge Instructions: Apply over primary dressing as directed. Compression Wrap: FourPress (4 layer compression wrap) 1 x Per Week/30 Days Discharge Instructions: Apply four layer compression as directed. use first layer unna boot to upper portion of lower legs to secure wrap in place. Compression Wrap: unna wrap at top of leg 1 x Per Week/30 Days Wound #2 - Lower Leg Wound Laterality: Right, Circumferential Peri-Wound Care: Triamcinolone 15 (g) 1 x Per Week/30 Days Discharge Instructions: Use triamcinolone 15 (g) as directed Peri-Wound Care: Sween Lotion (Moisturizing lotion) 1 x Per Week/30 Days Discharge Instructions: Apply moisturizing lotion as directed Prim Dressing: KerraCel Ag Gelling Fiber Dressing, 4x5 in (silver alginate) 1 x Per Week/30  Days ary Discharge Instructions: Apply silver alginate to wound bed as instructed Secondary Dressing: ABD Pad, 5x9 1 x Per Week/30 Days Discharge Instructions: Apply over primary dressing as directed. Secondary Dressing: Woven Gauze Sponge, Non-Sterile 4x4 in 1 x Per Week/30 Days Discharge Instructions: Apply over primary dressing as directed. Compression Wrap: FourPress (4 layer compression wrap) 1 x Per Week/30 Days Discharge Instructions: Apply four layer compression as directed. use first layer unna boot to upper portion of lower legs to secure wrap in place. Compression Wrap: unna wrap at top of leg 1 x Per Week/30 Days Electronic Signature(s) Signed: 11/01/2022 4:03:30 PM By: Linton Ham MD Signed: 11/01/2022 4:28:08 PM By: Adline Peals Entered By: Adline Peals on 11/01/2022 13:38:59 -------------------------------------------------------------------------------- Problem List Details Patient Name: Date of Service: [place], [name] RIA N 11/01/2022 12:45 PM Medical Record Number: [id number] Patient Account Number: [id number] Date of Birth/Sex: Treating RN: 10-20-1950 (72 y.o. [name] Primary Care Provider: [name], [name] Other Clinician: Referring Provider: Treating Provider/Extender: Linton Ham [name], [name] Weeks in Treatment: 15 Active Problems ICD-10 Encounter Code Description Active Date MDM Diagnosis L97.812 Non-pressure chronic ulcer of other part of right lower leg with fat layer 07/19/2022 No Yes exposed L97.822 Non-pressure chronic ulcer of other part of left lower leg with fat layer exposed7/20/2023 No Yes Dendinger, Jinny Sanders ([id number]) [id number] Page 4 of 7 I89.0 Lymphedema, not elsewhere classified 07/19/2022 No Yes I87.333 Chronic venous hypertension (idiopathic) with ulcer and inflammation of 07/19/2022 No Yes bilateral lower extremity E11.622 Type 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  07/19/2022 No Yes 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 07/19/2022 No Yes Inactive Problems Resolved Problems Electronic Signature(s) Signed:  11/01/2022 4:03:30 PM By: Linton Ham MD Entered By: Linton Ham on 11/01/2022 13:38:56 -------------------------------------------------------------------------------- Progress Note Details Patient Name: Date of Service: [place], [name] RIA N 11/01/2022 12:45 PM Medical Record Number: [id number] Patient Account Number: [id number] Date of Birth/Sex: Treating RN: 11-Apr-1950 (72 y.o. [name] Primary Care Provider: [name], [name] Other Clinician: Referring Provider: Treating Provider/Extender: Linton Ham [name], [name] Weeks in Treatment: 15 Subjective History of Present Illness (HPI) Admission 07/19/2022 Ms. Lorri Fukuhara Is a 72 year old female with a past medical history of endometrial cancer, lymphedema, insulin-dependent type 2 diabetes that presents to the clinic for a 4-week history of weeping to her lower extremities bilaterally. She visited the ED for this issue on 07/07/2022 and she was prescribed doxycycline For bilateral lower extremity cellulitis. She has completed this course. She currently denies signs of infection. She does not have compression stockings. She states she recently gained weight over the past couple months. She has never had wounds before. 7/28; patient presents for follow-up. We have been using silver alginate under Kerlix/Coban. She is scheduled to get her ABIs with TBI's on 8/7. She states that the wraps slid down to her mid shin. She denies signs of infection. 8/7; patient presents for follow-up. We have been using silver alginate under Kerlix/Coban. She had arterial studies done that showed an ABI of 0.86 on the right and 0.82 on the left with multiphasic waveforms throughout the lower extremities. 8/14; patient presents for follow-up. We have been using silver alginate under 3 layer  compression. She tolerated the wrap well. She has no issues or complaints today. 8/21; patient presents for follow-up. We have been using silver alginate under 3 layer compression. She has no issues or complaints today. She has her juxta lite compressions with her. 8/28; patient presents for follow-up. We have been using silver alginate under 3 layer compression. She has no issues or complaints today. 9/5; patient presents for follow-up. We have been using silver alginate under 4 layer compression to both extremities bilaterally. She has her juxta lite compression with her today. 9/12; patient presents for follow-up. We have been using silver alginate under 4-layer compression to the right leg. She was supposed to wear her juxta lite compression to the left lower extremity. She states she could not get it on so she has not been wearing it. She also has not been taking her Lasix. 9/19; patient presents for follow-up. We have been using silver alginate under 4-layer compression to her lower extremities bilaterally. She has restarted taking her Lasix on a daily basis. 9/26; patient presents for follow-up. We have been using silver alginate under 4-layer compression to the lower extremities bilaterally. Her left lower extremity wounds have healed. She has juxta lite compressions. 10/12; patient presents for follow-up. Patient had COVID last week and missed her appointment. She states she took her compression wrap off on the right lower extremity. Her wounds were healed on the left lower extremity at last clinic visit but these have since reopened. She has juxta lite compression and Marcus, Latesha ([id number]) [id number] Page 5 of 7 states she has been wearing them. Unfortunately she has multiple scattered wounds now to her right lower extremity as well. No signs of infection on exam. We have offered to order her lymphedema pumps however she states she wants to use her  husband's. She has not been doing this. Her diuretic is 60 mg of Lasix twice weekly. She has not been contacted for her  venous reflux studies. 10/19; patient presents for follow-up. She is now on 80 mg of Lasix twice daily. She reports significant improvement in her leg swelling. Her wounds have improved greatly. We have been using silver alginate under 4-layer compression. The left leg is healed. She does not have her Velcro compression wrap with her. She has 2 open wounds to the right lower extremity limited to skin breakdown with minimal weeping. She denies signs of infection. 10/26; patient presents for follow-up. The left lower extremity wounds Remain closed. Her right lower extremity still has some scattered open wounds limited to skin breakdown. The wrap slid down slightly to the right lower extremity. She is now taking 80 mg of Lasix in the morning and '40mg'$  in the afternoon. 11/2; the patient was healed out on the left last visit. She did not have a JuxtaLite which she did have was a stocking from [employer]. She used this however predictably the leg is broken down in the left anterior and 2 small areas in the left calf. The right leg really does not look too bad. Objective Constitutional Sitting or standing Blood Pressure is within target range for patient.. Pulse regular and within target range for patient.Marland Kitchen Respirations regular, non-labored and within target range.. Temperature is normal and within the target range for the patient.Marland Kitchen Appears in no distress. Vitals Time Taken: 12:54 PM, Height: 64 in, Weight: 325 lbs, BMI: 55.8, Temperature: 97.8 F, Pulse: 91 bpm, Respiratory Rate: 20 breaths/min, Blood Pressure: 115/72 mmHg. General Notes: Wound exam; left lower extremity superficial wounds which were new this week on the left anterior tibial area and 2 small areas on the left posterior calf. None of these have any depth and they do not appear ominous however the edema control in her left leg  is very poor there is stasis type changes in a band in the mid part of the leg oo Right lower extremity the area actually looks quite good anteriorly Integumentary (Hair, Skin) Wound #1RR status is Open. Original cause of wound was Gradually Appeared. The date acquired was: 07/05/2022. The wound has been in treatment 15 weeks. The wound is located on the Left,Circumferential Lower Leg. The wound measures 3.5cm length x 20cm width x 0.1cm depth; 54.978cm^2 area and 5.498cm^3 volume. There is Fat Layer (Subcutaneous Tissue) exposed. There is no tunneling or undermining noted. There is a medium amount of serous drainage noted. The wound margin is distinct with the outline attached to the wound base. There is large (67-100%) red granulation within the wound bed. There is a small (1-33%) amount of necrotic tissue within the wound bed including Adherent Slough. The periwound skin appearance had no abnormalities noted for texture. The periwound skin appearance had no abnormalities noted for color. The periwound skin appearance exhibited: Dry/Scaly. The periwound skin appearance did not exhibit: Maceration. Periwound temperature was noted as No Abnormality. The periwound has tenderness on palpation. Wound #2 status is Open. Original cause of wound was Gradually Appeared. The date acquired was: 07/05/2022. The wound has been in treatment 15 weeks. The wound is located on the Right,Circumferential Lower Leg. The wound measures 5cm length x 2.4cm width x 0.1cm depth; 9.425cm^2 area and 0.942cm^3 volume. There is Fat Layer (Subcutaneous Tissue) exposed. There is no tunneling or undermining noted. There is a small amount of serous drainage noted. The wound margin is distinct with the outline attached to the wound base. There is large (67-100%) red granulation within the wound bed. There is no necrotic tissue within the  wound bed. The periwound skin appearance had no abnormalities noted for texture. The periwound skin  appearance exhibited: Dry/Scaly, Hemosiderin Staining. The periwound skin appearance did not exhibit: Maceration, Atrophie Blanche, Cyanosis, Ecchymosis, Mottled, Pallor, Rubor, Erythema. Periwound temperature was noted as No Abnormality. 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 - Dr. Heber [place] Nurse Visit: - next week Other: - bring in compression stockings next week. Bathing/ Shower/ Hygiene: May shower with protection but do not get wound dressing(s) wet. Edema Control - Lymphedema / SCD / Other: Lymphedema Pumps. Use Lymphedema pumps on leg(s) 2-3 times a day for 45-60 minutes. If wearing any wraps or hose, do not remove them. Continue exercising as instructed. - 2-3 times a day throughout the day. Elevate legs to the level of the heart or above for 30 minutes daily and/or when sitting, a frequency of: - 3-4 times a day throughout the day. Avoid standing for long periods of time. Patient to wear own compression stockings every day. - on left leg right now; still wrapping right leg; apply in the morning and remove at night [place], Jinny Sanders ([id number]) [id number] Page 6 of 7 Exercise regularly WOUND #1RR: - Lower Leg Wound Laterality: Left, Circumferential Peri-Wound Care: Triamcinolone 15 (g) 1 x Per Week/30 Days Discharge Instructions: Use triamcinolone 15 (g) as directed Peri-Wound Care: Sween Lotion (Moisturizing lotion) 1 x Per Week/30 Days Discharge Instructions: Apply moisturizing lotion as directed Prim Dressing: KerraCel Ag  Gelling Fiber Dressing, 4x5 in (silver alginate) 1 x Per Week/30 Days ary Discharge Instructions: Apply silver alginate to wound bed as instructed Secondary Dressing: ABD Pad, 5x9 1 x Per Week/30 Days Discharge Instructions: Apply over primary dressing as directed. Secondary Dressing: Woven Gauze Sponge, Non-Sterile 4x4 in 1 x Per Week/30 Days Discharge Instructions: Apply over primary dressing as directed. Com pression Wrap: FourPress (4 layer compression wrap) 1 x Per Week/30 Days Discharge Instructions: Apply four layer compression as directed. use first layer unna boot to upper portion of lower legs to secure wrap in place. Com pression Wrap: unna wrap at top of leg 1 x Per Week/30 Days WOUND #2: - Lower Leg Wound Laterality: Right, Circumferential Peri-Wound Care: Triamcinolone 15 (g) 1 x Per Week/30 Days Discharge Instructions: Use triamcinolone 15 (g) as directed Peri-Wound Care: Sween Lotion (Moisturizing lotion) 1 x Per Week/30 Days Discharge Instructions: Apply moisturizing lotion as directed Prim Dressing: KerraCel Ag Gelling Fiber Dressing, 4x5 in (silver alginate) 1 x Per Week/30 Days ary Discharge Instructions: Apply silver alginate to wound bed as instructed Secondary Dressing: ABD Pad, 5x9 1 x Per Week/30 Days Discharge Instructions: Apply over primary dressing as directed. Secondary Dressing: Woven Gauze Sponge, Non-Sterile 4x4 in 1 x Per Week/30 Days Discharge Instructions: Apply over primary dressing as directed. Com pression Wrap: FourPress (4 layer compression wrap) 1 x Per Week/30 Days Discharge Instructions: Apply four layer compression as directed. use first layer unna boot to upper portion of lower legs to secure wrap in place. Com pression Wrap: unna wrap at top of leg 1 x Per Week/30 Days 1. Back in calcium alginate bilaterally with ABDs and 4-layer compression 2. Clearly the [employer] stocking to the  left leg was not adequate. I have asked her to bring the juxta  lite stockings next week 3. She also has external compression pumps however her husband apparently has inclusion body myositis and his upper extremity strength is insufficient to help her with application of anything for her legs Electronic Signature(s) Signed: 11/01/2022 4:03:30 PM By: Linton Ham MD Entered By: Linton Ham on 11/01/2022 13:42:21 -------------------------------------------------------------------------------- SuperBill Details Patient Name: Date of Service: [name] 11/01/2022 Medical Record Number: [id number] Patient Account Number: [id number] Date of Birth/Sex: Treating RN: 1950/12/06 (72 y.o. [name], [name] Primary Care Provider: [name], [name] Other Clinician: Referring Provider: Treating Provider/Extender: Linton Ham [name] Weeks in Treatment: 15 Diagnosis Coding ICD-10 Codes Code Description [phone] 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d L97.822 Non-pressure chronic ulcer of other part of left lower leg with fat layer exposed I89.0 Lymphedema, not elsewhere classified I87.333 Chronic venous hypertension (idiopathic) with ulcer and inflammation of bilateral lower extremity E11.622 Type 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 ICD-10 Diagnosis Description B70.488 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d L97.822  Non-pressure chronic ulcer of other part of left lower leg with fat layer exposed 565) [id number] Modifier: [id number]. Quantity: 1 pdf Page 7 of 7 Physician Procedures : CPT4 Code Description Modifier [id number] - WC PHYS LEVEL 3 - EST PT ICD-10 Diagnosis Description  A56.979 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d L97.822 Non-pressure chronic ulcer of other part of left  lower leg with fat layer exposed I89.0 Lymphedema, not elsewhere classified I87.333 Chronic venous hypertension (idiopathic) with ulcer and inflammation of bilateral lower extremity Quantity: 1 Electronic Signature(s) Signed: 11/01/2022 4:28:08 PM By: Adline Peals Signed: 11/02/2022 12:29:37 PM By: Linton Ham MD Previous Signature: 11/01/2022 4:03:30 PM Version By: Linton Ham MD Entered By: Adline Peals on 11/01/2022 16:25:06

## 2022-11-03 NOTE — Progress Notes (Signed)
[name], [name] ([id number]) [id number] Page 1 of 10 Visit Report for 11/01/2022 Arrival Information Details Patient Name: Date of Service: [name] [name], [name] [name] 11/01/2022 12:45 PM Medical Record Number: [id number] Patient Account Number: [id number] Date of Birth/Sex: Treating RN: 1950-05-27 (72 y.o. Harlow Ohms Primary Care Railee Bonillas: [name] Other Clinician: Referring Sherae Santino: Treating Ewan Grau/Extender: Linton Ham [name] Weeks in Treatment: 15 Visit Information History Since Last Visit Added or deleted any medications: No Patient Arrived: Wheel Chair Any new allergies or adverse reactions: No Arrival Time: 12:50 Had a fall or experienced change in No Accompanied By: self activities of daily living that may affect Transfer Assistance: None risk of falls: Patient Identification Verified: Yes Signs or symptoms of abuse/neglect since last visito No Secondary Verification Process Completed: Yes Hospitalized since last visit: No Patient Requires Transmission-Based No Implantable device outside of the clinic excluding No Precautions: cellular tissue based products placed in the center Patient Has Alerts: Yes since last visit: Patient Alerts: Patient on Blood Thinner Has Dressing in Place as Prescribed: Yes 08/06/2022 TBI L 0.59 R0.65 Has Compression in Place as Prescribed: Yes 08/06/2022 ABI R 0.86 Pain Present Now: No L0.82 Electronic Signature(s) Signed: 11/01/2022 4:28:08 PM By: Adline Peals Entered By: Adline Peals on 11/01/2022 12:54:22 -------------------------------------------------------------------------------- Compression Therapy Details Patient Name: Date of Service: [name] [name] RIA N 11/01/2022 12:45 PM Medical Record Number: [id number] Patient Account Number: [id number] Date of Birth/Sex: Treating RN: 04-22-1950 (72 y.o. Harlow Ohms Primary Care Xayne Brumbaugh: [facility] [place] [name]  Other Clinician: Referring Kriss Perleberg: Treating Verenice Westrich/Extender: Linton Ham [name] Weeks in Treatment: 15 Compression Therapy Performed for Wound Assessment: Wound #1RR Left,Circumferential Lower Leg Performed By: Clinician Adline Peals, RN Compression Type: Four Layer Post Procedure Diagnosis Same as Pre-procedure Electronic Signature(s) Signed: 11/01/2022 4:28:08 PM By: Adline Peals Entered By: Adline Peals on 11/01/2022 16:20:58 [name] ([id number]) [id number] Page 2 of 10 -------------------------------------------------------------------------------- Compression Therapy Details Patient Name: Date of Service: [name], [name] 11/01/2022 12:45 PM Medical Record Number: [id number] Patient Account Number: [id number] Date of Birth/Sex: Treating RN: [birth year] (72 y.o. Harlow Ohms Primary Care Presly Steinruck: [facility] [place] [name] Other Clinician: Referring Gardner Servantes: Treating Milbern Doescher/Extender: Linton Ham [name] Weeks in Treatment: 15 Compression Therapy Performed for Wound Assessment: Wound #2 Right,Circumferential Lower Leg Performed By: Clinician Adline Peals, RN Compression Type: Four Layer Post Procedure Diagnosis Same as Pre-procedure Electronic Signature(s) Signed: 11/01/2022 4:28:08 PM By: Adline Peals Entered By: Adline Peals on 11/01/2022 16:20:58 -------------------------------------------------------------------------------- Encounter Discharge Information Details Patient Name: Date of Service: [name] [name], [name] RIA N 11/01/2022 12:45 PM Medical Record Number: [id number] Patient Account Number: [id number] Date of Birth/Sex: Treating RN: [birth year] (72 y.o. Harlow Ohms Primary Care Zamar Odwyer: [name] Other Clinician: Referring Napolean Sia: Treating Jourden Gilson/Extender: Linton Ham [name] Weeks in Treatment:  15 Encounter Discharge Information Items Discharge Condition: Stable Ambulatory Status: Wheelchair Discharge Destination: Home Transportation: Private Auto Accompanied By: self Schedule Follow-up Appointment: Yes Clinical Summary of Care: Patient Declined Electronic Signature(s) Signed: 11/01/2022 4:28:08 PM By: Adline Peals Entered By: Adline Peals on 11/01/2022 13:15:07 -------------------------------------------------------------------------------- Lower Extremity Assessment Details Patient Name: Date of Service: [name], [name] 11/01/2022 12:45 PM Medical Record Number: [id number] Patient Account Number: [id number] Date of Birth/Sex: Treating RN: March 09, 1950 (72 y.o. Harlow Ohms Primary Care Quinnten Calvin: [name] Other Clinician: Referring  Darnisha Vernet: Treating Tallyn Holroyd/Extender: Linton Ham [name] Weeks in Treatment: 15 Edema Assessment Assessed: [Left: No] [Right: No] Edema: [Left: Yes] [Right: Yes] Calf [name], [name] ([id number]) [id number] Page 3 of 10 Left: Right: Point of Measurement: 39 cm From Medial Instep 45.5 cm 46.5 cm Ankle Left: Right: Point of Measurement: 10 cm From Medial Instep 30 cm 27.5 cm Vascular Assessment Pulses: Dorsalis Pedis Palpable: [Left:Yes] [Right:Yes] Electronic Signature(s) Signed: 11/01/2022 4:28:08 PM By: Adline Peals Entered By: Adline Peals on 11/01/2022 13:02:24 -------------------------------------------------------------------------------- Multi Wound Chart Details Patient Name: Date of Service: [name] [name] RIA N 11/01/2022 12:45 PM Medical Record Number: [id number] Patient Account Number: [id number] Date of Birth/Sex: Treating RN: [birth year] (72 y.o. [name] [name] Primary Care Khup Sapia: [name] Other Clinician: Referring Jerlean Peralta: Treating Marqueta Pulley/Extender: Linton Ham [name] Weeks in Treatment:  15 Vital Signs Height(in): 64 Pulse(bpm): 91 Weight(lbs): 325 Blood Pressure(mmHg): 115/72 Body Mass Index(BMI): 55.8 Temperature(F): 97.8 Respiratory Rate(breaths/min): 20 [1RR:Photos:] [N/A:N/A] Left, Circumferential Lower Leg Right, Circumferential Lower Leg N/A Wound Location: Gradually Appeared Gradually Appeared N/A Wounding Event: Lymphedema Lymphedema N/A Primary Etiology: Anemia, Arrhythmia, Hypertension, Anemia, Arrhythmia, Hypertension, N/A Comorbid History: Type II Diabetes, Gout, Osteoarthritis, Type II Diabetes, Gout, Osteoarthritis, Received Radiation Received Radiation 07/05/2022 07/05/2022 N/A Date Acquired: 15 15 N/A Weeks of Treatment: Open Open N/A Wound Status: Yes No N/A Wound Recurrence: Yes Yes N/A Clustered Wound: 5 10 N/A Clustered Quantity: 3.5x20x0.1 5x2.4x0.1 N/A Measurements L x W x D (cm) 54.978 9.425 N/A A (cm) : rea 5.498 0.942 N/A Volume (cm) : 86.20% 97.60% N/A % Reduction in Area: 86.20% 97.60% N/A % Reduction in Volume: Full Thickness Without Exposed Full Thickness Without Exposed N/A Classification: Support Structures Support Structures Medium Small N/A Exudate Amount: Serous Serous N/A Exudate Type: amber amber N/A Exudate Color: Distinct, outline attached Distinct, outline attached N/A Wound MarginICEY, [name] ([id number]) [id number] Page 4 of 10 Large (67-100%) Large (67-100%) N/A Granulation Amount: Red Red N/A Granulation Quality: Small (1-33%) None Present (0%) N/A Necrotic Amount: Fat Layer (Subcutaneous Tissue): Yes Fat Layer (Subcutaneous Tissue): Yes N/A Exposed Structures: Fascia: No Fascia: No Tendon: No Tendon: No Muscle: No Muscle: No Joint: No Joint: No Bone: No Bone: No Large (67-100%) Large (67-100%) N/A Epithelialization: Excoriation: No Excoriation: No N/A Periwound Skin Texture: Induration: No Induration: No Callus: No Callus: No Crepitus:  No Crepitus: No Rash: No Rash: No Scarring: No Scarring: No Dry/Scaly: Yes Dry/Scaly: Yes N/A Periwound Skin Moisture: Maceration: No Maceration: No Hemosiderin Staining: Yes Hemosiderin Staining: Yes N/A Periwound Skin Color: Atrophie Blanche: No Atrophie Blanche: No Cyanosis: No Cyanosis: No Ecchymosis: No Ecchymosis: No Erythema: No Erythema: No Mottled: No Mottled: No Pallor: No Pallor: No Rubor: No Rubor: No No Abnormality No Abnormality N/A Temperature: Yes N/A N/A Tenderness on Palpation: Treatment Notes Wound #1RR (Lower Leg) Wound Laterality: Left, Circumferential Cleanser Peri-Wound Care Sween Lotion (Moisturizing lotion) Discharge Instruction: Apply moisturizing lotion as directed Topical Primary Dressing KerraCel Ag Gelling Fiber Dressing, 4x5 in (silver alginate) Discharge Instruction: Apply silver alginate to wound bed as instructed Secondary Dressing ABD Pad, 5x9 Discharge Instruction: Apply over primary dressing as directed. Woven Gauze Sponge, Non-Sterile 4x4 in Discharge Instruction: Apply over primary dressing as directed. Secured With Compression Wrap FourPress (4 layer compression wrap) Discharge Instruction: Apply four layer compression as directed. use first layer unna boot to upper portion of lower legs to secure wrap in place. unna wrap at top of leg Compression  Stockings Add-Ons Wound #2 (Lower Leg) Wound Laterality: Right, Circumferential Cleanser Peri-Wound Care Sween Lotion (Moisturizing lotion) Discharge Instruction: Apply moisturizing lotion as directed Topical Primary Dressing KerraCel Ag Gelling Fiber Dressing, 4x5 in (silver alginate) Discharge Instruction: Apply silver alginate to wound bed as instructed Secondary Dressing ABD Pad, 5x9 Discharge Instruction: Apply over primary dressing as directed. Woven Gauze Sponge, Non-Sterile 4x4 in [place], Jinny Sanders ([id number]) [id number] Page 5 of  10 Discharge Instruction: Apply over primary dressing as directed. Secured With Compression Wrap FourPress (4 layer compression wrap) Discharge Instruction: Apply four layer compression as directed. use first layer unna boot to upper portion of lower legs to secure wrap in place. unna wrap at top of leg Compression Stockings Add-Ons Electronic Signature(s) Signed: 11/01/2022 4:03:30 PM By: Linton Ham MD Signed: 11/02/2022 5:25:29 PM By: [name] RN, BSN Entered By: Linton Ham on 11/01/2022 13:39:04 -------------------------------------------------------------------------------- Multi-Disciplinary Care Plan Details Patient Name: Date of Service: [place], [place] 11/01/2022 12:45 PM Medical Record Number: [id number] Patient Account Number: [id number] Date of Birth/Sex: Treating RN: Jul 30, 1950 (72 y.o. Harlow Ohms Primary Care Trameka Dorough: [facility] [name] Other Clinician: Referring Latrecia Capito: Treating Djeneba Barsch/Extender: Linton Ham [name] Weeks in Treatment: 15 Active Inactive Pain, Acute or Chronic Nursing Diagnoses: Pain, acute or chronic: actual or potential Potential alteration in comfort, pain Goals: Patient will verbalize adequate pain control and receive pain control interventions during procedures as needed Date Initiated: 07/19/2022 Target Resolution Date: 12/29/2022 Goal Status: Active Patient/caregiver will verbalize comfort level met Date Initiated: 07/19/2022 Date Inactivated: 08/06/2022 Target Resolution Date: 08/06/2022 Goal Status: Met Interventions: Encourage patient to take pain medications as prescribed Provide education on pain management Reposition patient for comfort Treatment Activities: Administer pain control measures as ordered : 07/19/2022 Notes: Venous Leg Ulcer Nursing Diagnoses: Actual venous Insuffiency (use after diagnosis is confirmed) Potential for venous Insuffiency (use before diagnosis  confirmed) Goals: Non-invasive venous studies are completed as ordered Date Initiated: 07/19/2022 Target Resolution Date: 11/03/2022 Goal Status: Active Interventions: Assess peripheral edema status every visit. KONNI, KESINGER ([id number]) [id number] Page 6 of 10 Compression as ordered Provide education on venous insufficiency Treatment Activities: Non-invasive vascular studies : 07/19/2022 Notes: 10/11/2022: patient not maintaining/controlling the edema. Patient had COVID last week and missed appt. Patient notes wearing [employer] HD daily. Electronic Signature(s) Signed: 11/01/2022 4:28:08 PM By: Adline Peals Entered By: Adline Peals on 11/01/2022 13:14:25 -------------------------------------------------------------------------------- Pain Assessment Details Patient Name: Date of Service: ZARIEL, CAPANO 11/01/2022 12:45 PM Medical Record Number: [id number] Patient Account Number: [id number] Date of Birth/Sex: Treating RN: 1950-08-22 (72 y.o. Harlow Ohms Primary Care Mckoy Bhakta: [facility] [place] [name] Other Clinician: Referring Marley Charlot: Treating Aviannah Castoro/Extender: Linton Ham [name] Weeks in Treatment: 15 Active Problems Location of Pain Severity and Description of Pain Patient Has Paino No Site Locations Rate the pain. Current Pain Level: 0 Pain Management and Medication Current Pain Management: Electronic Signature(s) Signed: 11/01/2022 4:28:08 PM By: Adline Peals Entered By: Adline Peals on 11/01/2022 12:54:45 -------------------------------------------------------------------------------- Patient/Caregiver Education Details Patient Name: Date of Service: Aleda Grana 11/2/2023andnbsp12:45 PM Medical Record Number: [id number] Patient Account Number: [id number] Date of Birth/Gender: Treating RN: [birth year] (72 y.o. Harlow Ohms [place], [place] ([id number])  [id number] Page 7 of 10 Primary Care Physician: [name] Other Clinician: Referring Physician: Treating Physician/Extender: Linton Ham [name] Weeks in Treatment: 15 Education Assessment Education Provided To: Patient Education Topics  Provided Wound/Skin Impairment: Methods: Explain/Verbal Responses: Reinforcements needed, State content correctly Electronic Signature(s) Signed: 11/01/2022 4:28:08 PM By: Adline Peals Entered By: Adline Peals on 11/01/2022 13:14:37 -------------------------------------------------------------------------------- Wound Assessment Details Patient Name: Date of Service: [place], [name] RIA N 11/01/2022 12:45 PM Medical Record Number: [id number] Patient Account Number: [id number] Date of Birth/Sex: Treating RN: October 11, 1950 (72 y.o. Harlow Ohms Primary Care Kyona Chauncey: [name] Other Clinician: Referring Wyndell Cardiff: Treating Marisal Swarey/Extender: Linton Ham [name] Weeks in Treatment: 15 Wound Status Wound Number: 1RR Primary Lymphedema Etiology: Wound Location: Left, Circumferential Lower Leg Wound Open Wounding Event: Gradually Appeared Status: Date Acquired: 07/05/2022 Comorbid Anemia, Arrhythmia, Hypertension, Type II Diabetes, Gout, Weeks Of Treatment: 15 History: Osteoarthritis, Received Radiation Clustered Wound: Yes Photos Wound Measurements Length: (cm) Width: (cm) Depth: (cm) Clustered Quantity: Area: (cm) Volume: (cm) 3.5 % Reduction in Area: 86.2% 20 % Reduction in Volume: 86.2% 0.1 Epithelialization: Large (67-100%) 5 Tunneling: No 54.978 Undermining: No 5.498 Wound Description Classification: Full Thickness Without Exposed Support Structures Wound Margin: Distinct, outline attached Exudate Amount: Medium Exudate Type: Serous Exudate Color: amber [place], Jinny Sanders ([id number]) Foul Odor After Cleansing:  No Slough/Fibrino Yes [id number] Page 8 of 10 Wound Bed Granulation Amount: Large (67-100%) Exposed Structure Granulation Quality: Red Fascia Exposed: No Necrotic Amount: Small (1-33%) Fat Layer (Subcutaneous Tissue) Exposed: Yes Necrotic Quality: Adherent Slough Tendon Exposed: No Muscle Exposed: No Joint Exposed: No Bone Exposed: No Periwound Skin Texture Texture Color No Abnormalities Noted: Yes No Abnormalities Noted: Yes Moisture Temperature / Pain No Abnormalities Noted: No Temperature: No Abnormality Dry / Scaly: Yes Tenderness on Palpation: Yes Maceration: No Treatment Notes Wound #1RR (Lower Leg) Wound Laterality: Left, Circumferential Cleanser Peri-Wound Care Sween Lotion (Moisturizing lotion) Discharge Instruction: Apply moisturizing lotion as directed Topical Primary Dressing KerraCel Ag Gelling Fiber Dressing, 4x5 in (silver alginate) Discharge Instruction: Apply silver alginate to wound bed as instructed Secondary Dressing ABD Pad, 5x9 Discharge Instruction: Apply over primary dressing as directed. Woven Gauze Sponge, Non-Sterile 4x4 in Discharge Instruction: Apply over primary dressing as directed. Secured With Compression Wrap FourPress (4 layer compression wrap) Discharge Instruction: Apply four layer compression as directed. use first layer unna boot to upper portion of lower legs to secure wrap in place. unna wrap at top of leg Compression Stockings Add-Ons Electronic Signature(s) Signed: 11/01/2022 4:28:08 PM By: Adline Peals Signed: 11/02/2022 5:25:29 PM By: [name] RN, BSN Entered By: [name] on 11/01/2022 13:07:18 -------------------------------------------------------------------------------- Wound Assessment Details Patient Name: Date of Service: [place] [name], [name] RIA N 11/01/2022 12:45 PM Medical Record Number: [id number] Patient Account Number: [id number] Date of Birth/Sex: Treating RN: 26-Oct-1950 (72  y.o. Harlow Ohms Primary Care Ason Heslin: [facility] [place] [name] Other Clinician: Referring Amaranta Mehl: Treating Ardit Danh/Extender: Linton Ham [name] Weeks in Treatment: 15 Wound Status Wound Number: 2 Primary Lymphedema Etiology: SHANNETTE, [name] ([id number]) [id number] Page 9 of 10 Etiology: Wound Location: Right, Circumferential Lower Leg Wound Open Wounding Event: Gradually Appeared Status: Date Acquired: 07/05/2022 Comorbid Anemia, Arrhythmia, Hypertension, Type II Diabetes, Gout, Weeks Of Treatment: 15 History: Osteoarthritis, Received Radiation Clustered Wound: Yes Photos Wound Measurements Length: (cm) Width: (cm) Depth: (cm) Clustered Quantity: Area: (cm) Volume: (cm) 5 % Reduction in Area: 97.6% 2.4 % Reduction in Volume: 97.6% 0.1 Epithelialization: Large (67-100%) 10 Tunneling: No 9.425 Undermining: No 0.942 Wound Description Classification: Full Thickness Without Exposed Sup Wound Margin: Distinct, outline attached Exudate Amount: Small Exudate Type: Serous Exudate Color: amber port Structures  Foul Odor After Cleansing: No Slough/Fibrino No Wound Bed Granulation Amount: Large (67-100%) Exposed Structure Granulation Quality: Red Fascia Exposed: No Necrotic Amount: None Present (0%) Fat Layer (Subcutaneous Tissue) Exposed: Yes Tendon Exposed: No Muscle Exposed: No Joint Exposed: No Bone Exposed: No Periwound Skin Texture Texture Color No Abnormalities Noted: Yes No Abnormalities Noted: No Atrophie Blanche: No Moisture Cyanosis: No No Abnormalities Noted: No Ecchymosis: No Dry / Scaly: Yes Erythema: No Maceration: No Hemosiderin Staining: Yes Mottled: No Pallor: No Rubor: No Temperature / Pain Temperature: No Abnormality Treatment Notes Wound #2 (Lower Leg) Wound Laterality: Right, Circumferential Cleanser Peri-Wound Care Sween Lotion (Moisturizing lotion) Discharge Instruction:  Apply moisturizing lotion as directed Topical Primary Dressing KerraCel Ag Gelling Fiber Dressing, 4x5 in (silver alginate) Discharge Instruction: Apply silver alginate to wound bed as instructed SHARILYN, [facility] ([id number]) [id number] Page 10 of 10 Secondary Dressing ABD Pad, 5x9 Discharge Instruction: Apply over primary dressing as directed. Woven Gauze Sponge, Non-Sterile 4x4 in Discharge Instruction: Apply over primary dressing as directed. Secured With Compression Wrap FourPress (4 layer compression wrap) Discharge Instruction: Apply four layer compression as directed. use first layer unna boot to upper portion of lower legs to secure wrap in place. unna wrap at top of leg Compression Stockings Add-Ons Electronic Signature(s) Signed: 11/01/2022 4:28:08 PM By: Adline Peals Signed: 11/02/2022 5:25:29 PM By: [name] RN, BSN Entered By: [name] on 11/01/2022 13:07:48 -------------------------------------------------------------------------------- Vitals Details Patient Name: Date of Service: [place]e [name], [place] 11/01/2022 12:45 PM Medical Record Number: [id number] Patient Account Number: [id number] Date of Birth/Sex: Treating RN: 07/26/1950 (72 y.o. Harlow Ohms Primary Care Natania Finigan: [name] Other Clinician: Referring Ignazio Kincaid: Treating Fynley Chrystal/Extender: Linton Ham [name] Weeks in Treatment: 15 Vital Signs Time Taken: 12:54 Temperature (F): 97.8 Height (in): 64 Pulse (bpm): 91 Weight (lbs): 325 Respiratory Rate (breaths/min): 20 Body Mass Index (BMI): 55.8 Blood Pressure (mmHg): 115/72 Reference Range: 80 - 120 mg / dl Electronic Signature(s) Signed: 11/01/2022 4:28:08 PM By: Adline Peals Entered By: Adline Peals on 11/01/2022 12:54:38

## 2022-11-05 ENCOUNTER — Telehealth: Payer: Self-pay

## 2022-11-05 ENCOUNTER — Other Ambulatory Visit (HOSPITAL_COMMUNITY): Payer: Self-pay

## 2022-11-05 DIAGNOSIS — I4891 Unspecified atrial fibrillation: Secondary | ICD-10-CM

## 2022-11-05 DIAGNOSIS — I10 Essential (primary) hypertension: Secondary | ICD-10-CM

## 2022-11-05 DIAGNOSIS — E118 Type 2 diabetes mellitus with unspecified complications: Secondary | ICD-10-CM

## 2022-11-05 DIAGNOSIS — Z79899 Other long term (current) drug therapy: Secondary | ICD-10-CM

## 2022-11-05 MED ORDER — LOSARTAN POTASSIUM 50 MG PO TABS
50.0000 mg | ORAL_TABLET | Freq: Every day | ORAL | 3 refills | Status: DC
  Filled 2022-11-05: qty 90, 90d supply, fill #0

## 2022-11-05 NOTE — Telephone Encounter (Signed)
My Chart sen to the pt will also follow up with her.

## 2022-11-05 NOTE — Telephone Encounter (Signed)
-----   Message from Dorris Carnes V, MD sent at 11/02/2022 11:18 PM EDT ----- Cr is sl better than previous but still not at baseline  FLuid remains up Cut back on losartan to 50 mg   Keep on same lasix Follow BP Follow BMET in 2 wks

## 2022-11-06 NOTE — Telephone Encounter (Signed)
The patient has been notified of the result and verbalized understanding.  All questions (if any) were answered.     

## 2022-11-08 ENCOUNTER — Encounter (HOSPITAL_BASED_OUTPATIENT_CLINIC_OR_DEPARTMENT_OTHER): Payer: Medicare Other | Admitting: Internal Medicine

## 2022-11-08 ENCOUNTER — Ambulatory Visit (INDEPENDENT_AMBULATORY_CARE_PROVIDER_SITE_OTHER): Payer: Medicare Other | Admitting: Podiatry

## 2022-11-08 DIAGNOSIS — M79675 Pain in left toe(s): Secondary | ICD-10-CM

## 2022-11-08 DIAGNOSIS — I87333 Chronic venous hypertension (idiopathic) with ulcer and inflammation of bilateral lower extremity: Secondary | ICD-10-CM | POA: Diagnosis not present

## 2022-11-08 DIAGNOSIS — M79674 Pain in right toe(s): Secondary | ICD-10-CM

## 2022-11-08 DIAGNOSIS — C541 Malignant neoplasm of endometrium: Secondary | ICD-10-CM | POA: Diagnosis not present

## 2022-11-08 DIAGNOSIS — L97822 Non-pressure chronic ulcer of other part of left lower leg with fat layer exposed: Secondary | ICD-10-CM | POA: Diagnosis not present

## 2022-11-08 DIAGNOSIS — B351 Tinea unguium: Secondary | ICD-10-CM

## 2022-11-08 DIAGNOSIS — I890 Lymphedema, not elsewhere classified: Secondary | ICD-10-CM | POA: Diagnosis not present

## 2022-11-08 DIAGNOSIS — E11622 Type 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: Secondary | ICD-10-CM | POA: Diagnosis not present

## 2022-11-08 DIAGNOSIS — L97812 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d: Secondary | ICD-10-CM | POA: Diagnosis not present

## 2022-11-08 NOTE — Progress Notes (Signed)
INZA, MIKRUT ([id number]) [id number] Page 1 of 1 Visit Report for 11/08/2022 SuperBill Details Patient Name: Date of Service: [name], [name] 11/08/2022 Medical Record Number: [id number] Patient Account Number: [id number] Date of Birth/Sex: Treating RN: 1950-11-20 (72 y.o. Harlow Ohms Primary Care Provider: [name] [name] Other Clinician: Referring Provider: Treating Provider/Extender: [name] [name] [name] Weeks in Treatment: 16 Diagnosis Coding ICD-10 Codes Code Description [phone] 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d L97.822 Non-pressure chronic ulcer of other part of left lower leg with fat layer exposed I89.0 Lymphedema, not elsewhere classified I87.333 Chronic venous hypertension (idiopathic) with ulcer and inflammation of bilateral lower extremity E11.622 Type 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 Facility Procedures CPT4 Description Modifier Quantity Code [id number] BILATERAL: Application of multi-layer venous compression system; leg (below knee), including ankle and 1 foot. ICD-10 Diagnosis Description I89.0 Lymphedema, not elsewhere classified Electronic Signature(s) Signed: 11/08/2022 3:49:20 PM By: Adline Peals Signed: 11/08/2022 4:44:16 PM By: [name] DO Entered By: Adline Peals on 11/08/2022 13:38:19

## 2022-11-08 NOTE — Progress Notes (Signed)
CHELSEI, MCCHESNEY ([id number]) [id number] Page 1 of 6 Visit Report for 11/08/2022 Arrival Information Details Patient Name: Date of Service: [name], [name] 11/08/2022 1:00 PM Medical Record Number: [id number] Patient Account Number: [id number] Date of Birth/Sex: Treating RN: 1950/09/03 (72 y.o. [name] Primary Care Brianda Beitler: [name] [name] Other Clinician: Referring Cliford Sequeira: Treating Nakeem Murnane/Extender: [name] [name] [name] Weeks in Treatment: 16 Visit Information History Since Last Visit Added or deleted any medications: No Patient Arrived: Wheel Chair Any new allergies or adverse reactions: No Arrival Time: 13:12 Had a fall or experienced change in No Accompanied By: self activities of daily living that may affect Transfer Assistance: None risk of falls: Patient Identification Verified: Yes Signs or symptoms of abuse/neglect since last visito No Secondary Verification Process Completed: Yes Hospitalized since last visit: No Patient Requires Transmission-Based No Implantable device outside of the clinic excluding No Precautions: cellular tissue based products placed in the center Patient Has Alerts: Yes since last visit: Patient Alerts: Patient on Blood Thinner Has Dressing in Place as Prescribed: Yes 08/06/2022 TBI L 0.59 R0.65 Has Compression in Place as Prescribed: Yes 08/06/2022 ABI R 0.86 Pain Present Now: No L0.82 Electronic Signature(s) Signed: 11/08/2022 3:49:20 PM By: Adline Peals Entered By: Adline Peals on 11/08/2022 13:12:43 -------------------------------------------------------------------------------- Compression Therapy Details Patient Name: Date of Service: [name] 11/08/2022 1:00 PM Medical Record Number: [id number] Patient Account Number: [id number] Date of Birth/Sex: Treating RN: May 22, 1950 (72 y.o. [name] Primary Care Wajiha Versteeg: [name] [name]  Other Clinician: Referring Antonio Woodhams: Treating Crue Otero/Extender: [name] [name] [name] Weeks in Treatment: 16 Compression Therapy Performed for Wound Assessment: Wound #1RR Left,Circumferential Lower Leg Performed By: Clinician Adline Peals, RN Compression Type: Four Layer Electronic Signature(s) Signed: 11/08/2022 3:49:20 PM By: Adline Peals Entered By: Adline Peals on 11/08/2022 13:38:37 -------------------------------------------------------------------------------- Compression Therapy Details Patient Name: Date of Service: [name] Boards [place] 11/08/2022 1:00 PM Medical Record Number: [id number] Patient Account Number: [id number] [name], [name] ([id number]) [id number] Page 2 of 6 Date of Birth/Sex: Treating RN: [birth year] (72 y.o. [name] Primary Care Keshun Berrett: Other Clinician: [facility] [place] [name] Referring Kian Gamarra: Treating Romy Ipock/Extender: [name] [name] [name] Weeks in Treatment: 16 Compression Therapy Performed for Wound Assessment: Wound #2 Right,Circumferential Lower Leg Performed By: Clinician Adline Peals, RN Compression Type: Four Layer Electronic Signature(s) Signed: 11/08/2022 3:49:20 PM By: Adline Peals Entered By: Adline Peals on 11/08/2022 13:38:37 -------------------------------------------------------------------------------- Encounter Discharge Information Details Patient Name: Date of Service: [name], [name] 11/08/2022 1:00 PM Medical Record Number: [id number] Patient Account Number: [id number] Date of Birth/Sex: Treating RN: 03/17/1950 (72 y.o. [name] Primary Care Gilberte Gorley: [name] [name] Other Clinician: Referring Kagen Kunath: Treating Ledarius Leeson/Extender: [name] [name] [name] Weeks in Treatment: 16 Encounter Discharge Information Items Discharge Condition: Stable Ambulatory Status:  Wheelchair Discharge Destination: Home Transportation: Private Auto Accompanied By: self Schedule Follow-up Appointment: Yes Clinical Summary of Care: Patient Declined Electronic Signature(s) Signed: 11/08/2022 3:49:20 PM By: Sabas Sous By: Adline Peals on 11/08/2022 13:38:08 -------------------------------------------------------------------------------- Patient/Caregiver Education Details Patient Name: Date of Service: [name] 11/9/2023andnbsp1:00 PM Medical Record Number: [id number] Patient Account Number: [id number] Date of Birth/Gender: Treating RN: 20-Mar-1950 (72 y.o. [name] Primary Care Physician: [name] [name] Other Clinician: Referring Physician: Treating Physician/Extender: [name] [name] [name] Weeks in Treatment:  16 Education Assessment Education Provided To: Patient Education Topics Provided Wound/Skin Impairment: Methods: Explain/Verbal Responses: Reinforcements needed, State content correctly Electronic Signature(s) [place], Jinny Sanders ([id number]) [id number] Page 3 of 6 Signed: 11/08/2022 3:49:20 PM By: Adline Peals Entered By: Adline Peals on 11/08/2022 13:37:26 -------------------------------------------------------------------------------- Wound Assessment Details Patient Name: Date of Service: [name], [name] 11/08/2022 1:00 PM Medical Record Number: [id number] Patient Account Number: [id number] Date of Birth/Sex: Treating RN: September 16, 1950 (72 y.o. [name] Primary Care Kay Ricciuti: [name] [name] Other Clinician: Referring Dreama Kuna: Treating Samayah Novinger/Extender: [name] [name] [name] Weeks in Treatment: 16 Wound Status Wound Number: 1RR Primary Lymphedema Etiology: Wound Location: Left, Circumferential Lower Leg Wound Open Wounding Event: Gradually Appeared Status: Date Acquired: 07/05/2022 Comorbid Anemia,  Arrhythmia, Hypertension, Type II Diabetes, Gout, Weeks Of Treatment: 16 History: Osteoarthritis, Received Radiation Clustered Wound: Yes Wound Measurements Length: (cm) Width: (cm) Depth: (cm) Clustered Quantity: Area: (cm) Volume: (cm) 3.5 % Reduction in Area: 86.2% 20 % Reduction in Volume: 86.2% 0.1 Epithelialization: Large (67-100%) 5 Tunneling: No 54.978 Undermining: No 5.498 Wound Description Classification: Full Thickness Without Exposed Sup Wound Margin: Distinct, outline attached Exudate Amount: Medium Exudate Type: Serous Exudate Color: amber port Structures Foul Odor After Cleansing: No Slough/Fibrino Yes Wound Bed Granulation Amount: Large (67-100%) Exposed Structure Granulation Quality: Red Fascia Exposed: No Necrotic Amount: Small (1-33%) Fat Layer (Subcutaneous Tissue) Exposed: Yes Necrotic Quality: Adherent Slough Tendon Exposed: No Muscle Exposed: No Joint Exposed: No Bone Exposed: No Periwound Skin Texture Texture Color No Abnormalities Noted: Yes No Abnormalities Noted: Yes Moisture Temperature / Pain No Abnormalities Noted: No Temperature: No Abnormality Dry / Scaly: Yes Tenderness on Palpation: Yes Maceration: No Treatment Notes Wound #1RR (Lower Leg) Wound Laterality: Left, Circumferential Cleanser Peri-Wound Care Triamcinolone 15 (g) Discharge Instruction: Use triamcinolone 15 (g) as directed Sween Lotion (Moisturizing lotion) Discharge Instruction: Apply moisturizing lotion as directed Topical Ferrer, Jinny Sanders ([id number]) [id number] Page 4 of 6 Primary Dressing KerraCel Ag Gelling Fiber Dressing, 4x5 in (silver alginate) Discharge Instruction: Apply silver alginate to wound bed as instructed Secondary Dressing ABD Pad, 5x9 Discharge Instruction: Apply over primary dressing as directed. Woven Gauze Sponge, Non-Sterile 4x4 in Discharge Instruction: Apply over primary dressing as directed. Secured  With Compression Wrap FourPress (4 layer compression wrap) Discharge Instruction: Apply four layer compression as directed. use first layer unna boot to upper portion of lower legs to secure wrap in place. unna wrap at top of leg Compression Stockings Add-Ons Electronic Signature(s) Signed: 11/08/2022 3:49:20 PM By: Adline Peals Entered By: Adline Peals on 11/08/2022 13:14:40 -------------------------------------------------------------------------------- Wound Assessment Details Patient Name: Date of Service: [name], [name] 11/08/2022 1:00 PM Medical Record Number: [id number] Patient Account Number: [id number] Date of Birth/Sex: Treating RN: 1950/10/26 (72 y.o. [name] Primary Care Litha Lamartina: [name] [name] Other Clinician: Referring Avigail Pilling: Treating Sharmon Cheramie/Extender: [name] [name] [name] Weeks in Treatment: 16 Wound Status Wound Number: 2 Primary Lymphedema Etiology: Wound Location: Right, Circumferential Lower Leg Wound Open Wounding Event: Gradually Appeared Status: Date Acquired: 07/05/2022 Comorbid Anemia, Arrhythmia, Hypertension, Type II Diabetes, Gout, Weeks Of Treatment: 16 History: Osteoarthritis, Received Radiation Clustered Wound: Yes Wound Measurements Length: (cm) Width: (cm) Depth: (cm) Clustered Quantity: Area: (cm) Volume: (cm) 5 % Reduction in Area: 97.6% 2.4 % Reduction in Volume: 97.6% 0.1 Epithelialization: Large (67-100%) 10 9.425 0.942 Wound Description Classification: Full Thickness Without Exposed Sup Wound Margin: Distinct, outline attached Exudate Amount: Small Exudate Type: Serous  Exudate Color: amber port Structures Foul Odor After Cleansing: No Slough/Fibrino No Wound Bed Granulation Amount: Large (67-100%) Exposed Structure Granulation Quality: Red Fascia Exposed: No Necrotic Amount: None Present (0%) Fat Layer (Subcutaneous Tissue) Exposed: Yes Tendon Exposed:  No Muscle Exposed: No Joint Exposed: No Bone Exposed: No Blau, Jinny Sanders ([id number]) [id number] Page 5 of 6 Periwound Skin Texture Texture Color No Abnormalities Noted: Yes No Abnormalities Noted: No Atrophie Blanche: No Moisture Cyanosis: No No Abnormalities Noted: No Ecchymosis: No Dry / Scaly: Yes Erythema: No Maceration: No Hemosiderin Staining: Yes Mottled: No Pallor: No Rubor: No Temperature / Pain Temperature: No Abnormality Treatment Notes Wound #2 (Lower Leg) Wound Laterality: Right, Circumferential Cleanser Peri-Wound Care Triamcinolone 15 (g) Discharge Instruction: Use triamcinolone 15 (g) as directed Sween Lotion (Moisturizing lotion) Discharge Instruction: Apply moisturizing lotion as directed Topical Primary Dressing KerraCel Ag Gelling Fiber Dressing, 4x5 in (silver alginate) Discharge Instruction: Apply silver alginate to wound bed as instructed Secondary Dressing ABD Pad, 5x9 Discharge Instruction: Apply over primary dressing as directed. Woven Gauze Sponge, Non-Sterile 4x4 in Discharge Instruction: Apply over primary dressing as directed. Secured With Compression Wrap FourPress (4 layer compression wrap) Discharge Instruction: Apply four layer compression as directed. use first layer unna boot to upper portion of lower legs to secure wrap in place. unna wrap at top of leg Compression Stockings Add-Ons Electronic Signature(s) Signed: 11/08/2022 3:49:20 PM By: Adline Peals Entered By: Adline Peals on 11/08/2022 13:14:53 -------------------------------------------------------------------------------- Vitals Details Patient Name: Date of Service: [name], [name] 11/08/2022 1:00 PM Medical Record Number: [id number] Patient Account Number: [id number] Date of Birth/Sex: Treating RN: [birth year] (72 y.o. [name] Primary Care Shavonta Gossen: [name] [name] Other Clinician: Referring  Crimson Dubberly: Treating Husayn Reim/Extender: [name] [name] [name] Weeks in Treatment: 16 Vital Signs Time Taken: 13:13 Temperature (F): 97.9 Height (in): 64 Pulse (bpm): 90 Gillum, Genice ([id number]) [id number] Page 6 of 6 Weight (lbs): 325 Respiratory Rate (breaths/min): 18 Body Mass Index (BMI): 55.8 Blood Pressure (mmHg): 107/68 Reference Range: 80 - 120 mg / dl Electronic Signature(s) Signed: 11/08/2022 3:49:20 PM By: Adline Peals Entered By: Adline Peals on 11/08/2022 13:14:13

## 2022-11-08 NOTE — Progress Notes (Signed)
  Subjective:  Patient ID: [name], female    DOB: 04-Mar-1950,  MRN: [id number]  Chief Complaint  Patient presents with   Nail Problem    Nail trim    72 y.o. female returns for the above complaint.  Patient presents with thickened elongated dystrophic toenails x10 mild pain on palpation.  Hurts with ambulation hurts with pressure.  She would like to debride down the  Objective:  There were no vitals filed for this visit. Podiatric Exam: Vascular: dorsalis pedis and posterior tibial pulses are palpable bilateral. Capillary return is immediate. Temperature gradient is WNL. Skin turgor WNL  Sensorium: Normal Semmes Weinstein monofilament test. Normal tactile sensation bilaterally. Nail Exam: Pt has thick disfigured discolored nails with subungual debris noted bilateral entire nail hallux through fifth toenails.  Pain on palpation to the nails. Ulcer Exam: There is no evidence of ulcer or pre-ulcerative changes or infection. Orthopedic Exam: Muscle tone and strength are WNL. No limitations in general ROM. No crepitus or effusions noted.  Skin: No Porokeratosis. No infection or ulcers    Assessment & Plan:   1. Pain due to onychomycosis of toenails of both feet     Patient was evaluated and treated and all questions answered.  Onychomycosis with pain  -Nails palliatively debrided as below. -Educated on self-care  Procedure: Nail Debridement Rationale: pain  Type of Debridement: manual, sharp debridement. Instrumentation: Nail nipper, rotary burr. Number of Nails: 10  Procedures and Treatment: Consent by patient was obtained for treatment procedures. The patient understood the discussion of treatment and procedures well. All questions were answered thoroughly reviewed. Debridement of mycotic and hypertrophic toenails, 1 through 5 bilateral and clearing of subungual debris. No ulceration, no infection noted.  Return Visit-Office Procedure: Patient instructed to return to the  office for a follow up visit 3 months for continued evaluation and treatment.  Boneta Lucks, DPM    Return in about 12 weeks (around 01/31/2023) for Routine Foot Care.

## 2022-11-11 ENCOUNTER — Other Ambulatory Visit: Payer: Self-pay

## 2022-11-11 MED ORDER — FUROSEMIDE 40 MG PO TABS
ORAL_TABLET | ORAL | 3 refills | Status: DC
  Filled 2022-11-11: qty 270, 90d supply, fill #0

## 2022-11-12 ENCOUNTER — Other Ambulatory Visit (HOSPITAL_COMMUNITY): Payer: Self-pay

## 2022-11-13 MED ORDER — POTASSIUM CHLORIDE ER 10 MEQ PO TBCR: 10.0000 meq | EXTENDED_RELEASE_TABLET | Freq: Two times a day (BID) | ORAL | 3 refills | Status: DC

## 2022-11-15 ENCOUNTER — Encounter (HOSPITAL_BASED_OUTPATIENT_CLINIC_OR_DEPARTMENT_OTHER): Payer: Medicare Other | Admitting: Internal Medicine

## 2022-11-15 DIAGNOSIS — E11622 Type 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: Secondary | ICD-10-CM | POA: Diagnosis not present

## 2022-11-15 DIAGNOSIS — L97822 Non-pressure chronic ulcer of other part of left lower leg with fat layer exposed: Secondary | ICD-10-CM

## 2022-11-15 DIAGNOSIS — I87333 Chronic venous hypertension (idiopathic) with ulcer and inflammation of bilateral lower extremity: Secondary | ICD-10-CM | POA: Diagnosis not present

## 2022-11-15 DIAGNOSIS — I890 Lymphedema, not elsewhere classified: Secondary | ICD-10-CM | POA: Diagnosis not present

## 2022-11-15 DIAGNOSIS — C541 Malignant neoplasm of endometrium: Secondary | ICD-10-CM | POA: Diagnosis not present

## 2022-11-15 DIAGNOSIS — L97812 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d: Secondary | ICD-10-CM

## 2022-11-16 NOTE — Progress Notes (Signed)
AUTUMN, PRUITT ([id number]) [id number] Page 1 of 7 Visit Report for 11/15/2022 Chief Complaint Document Details Patient Name: Date of Service: [name], [name] [name] 11/15/2022 11:00 A M Medical Record Number: [id number] Patient Account Number: [id number] Date of Birth/Sex: Treating RN: 1950/03/13 (72 y.o. F) Primary Care Provider: [name], [name] Other Clinician: Referring Provider: Treating Provider/Extender: [name] [name], [name] Weeks in Treatment: 17 Information Obtained from: Patient Chief Complaint 07/19/2022; bilateral lower extremity wounds Electronic Signature(s) Signed: 11/15/2022 5:05:23 PM By: [name] DO Entered By: [name] on 11/15/2022 16:49:44 -------------------------------------------------------------------------------- HPI Details Patient Name: Date of Service: [name] Leyden, [place] RIA N 11/15/2022 11:00 A M Medical Record Number: [id number] Patient Account Number: [id number] Date of Birth/Sex: Treating RN: 04/21/1950 (72 y.o. F) Primary Care Provider: [name], [name] Other Clinician: Referring Provider: Treating Provider/Extender: [name] [name], [name] Weeks in Treatment: 17 History of Present Illness HPI Description: Admission 07/19/2022 Ms. Nysia Dell Is a 72 year old female with a past medical history of endometrial cancer, lymphedema, insulin-dependent type 2 diabetes that presents to the clinic for a 4-week history of weeping to her lower extremities bilaterally. She visited the ED for this issue on 07/07/2022 and she was prescribed doxycycline For bilateral lower extremity cellulitis. She has completed this course. She currently denies signs of infection. She does not have compression stockings. She states she recently gained weight over the past couple months. She has never had wounds before. 7/28; patient presents for follow-up. We have been using silver alginate under  Kerlix/Coban. She is scheduled to get her ABIs with TBI's on 8/7. She states that the wraps slid down to her mid shin. She denies signs of infection. 8/7; patient presents for follow-up. We have been using silver alginate under Kerlix/Coban. She had arterial studies done that showed an ABI of 0.86 on the right and 0.82 on the left with multiphasic waveforms throughout the lower extremities. 8/14; patient presents for follow-up. We have been using silver alginate under 3 layer compression. She tolerated the wrap well. She has no issues or complaints today. 8/21; patient presents for follow-up. We have been using silver alginate under 3 layer compression. She has no issues or complaints today. She has her juxta lite compressions with her. 8/28; patient presents for follow-up. We have been using silver alginate under 3 layer compression. She has no issues or complaints today. 9/5; patient presents for follow-up. We have been using silver alginate under 4 layer compression to both extremities bilaterally. She has her juxta lite compression with her today. 9/12; patient presents for follow-up. We have been using silver alginate under 4-layer compression to the right leg. She was supposed to wear her juxta lite compression to the left lower extremity. She states she could not get it on so she has not been wearing it. She also has not been taking her Lasix. 9/19; patient presents for follow-up. We have been using silver alginate under 4-layer compression to her lower extremities bilaterally. She has restarted taking her Lasix on a daily basis. 9/26; patient presents for follow-up. We have been using silver alginate under 4-layer compression to the lower extremities bilaterally. Her left lower extremity wounds have healed. She has juxta lite compressions. TIMI, REESER ([id number]) [id number] Page 2 of 7 10/12; patient presents for follow-up. Patient had COVID last week and  missed her appointment. She states she took her compression wrap off on the right lower extremity. Her wounds were healed  on the left lower extremity at last clinic visit but these have since reopened. She has juxta lite compression and states she has been wearing them. Unfortunately she has multiple scattered wounds now to her right lower extremity as well. No signs of infection on exam. We have offered to order her lymphedema pumps however she states she wants to use her husband's. She has not been doing this. Her diuretic is 60 mg of Lasix twice weekly. She has not been contacted for her venous reflux studies. 10/19; patient presents for follow-up. She is now on 80 mg of Lasix twice daily. She reports significant improvement in her leg swelling. Her wounds have improved greatly. We have been using silver alginate under 4-layer compression. The left leg is healed. She does not have her Velcro compression wrap with her. She has 2 open wounds to the right lower extremity limited to skin breakdown with minimal weeping. She denies signs of infection. 10/26; patient presents for follow-up. The left lower extremity wounds Remain closed. Her right lower extremity still has some scattered open wounds limited to skin breakdown. The wrap slid down slightly to the right lower extremity. She is now taking 80 mg of Lasix in the morning and '40mg'$  in the afternoon. 11/2; the patient was healed out on the left last visit. She did not have a JuxtaLite which she did have was a stocking from [employer]. She used this however predictably the leg is broken down in the left anterior and 2 small areas in the left calf. The right leg really does not look too bad. 11/16; patient presents for follow-up. Her wounds have healed. She has her compression garments with her. Electronic Signature(s) Signed: 11/15/2022 5:05:23 PM By: [name] DO Entered By: [name] on 11/15/2022  16:50:20 -------------------------------------------------------------------------------- Physical Exam Details Patient Name: Date of Service: [name], [name] RIA N 11/15/2022 11:00 A M Medical Record Number: [id number] Patient Account Number: [id number] Date of Birth/Sex: Treating RN: 16-Feb-1950 (72 y.o. F) Primary Care Provider: [name], [name] Other Clinician: Referring Provider: Treating Provider/Extender: [name] [name], [name] Weeks in Treatment: 17 Constitutional respirations regular, non-labored and within target range for patient.. Cardiovascular 2+ dorsalis pedis/posterior tibialis pulses. Psychiatric pleasant and cooperative. Notes Lower extremities with epithelization to the previous wound site. Good edema control. Lymphedema skin changes. Electronic Signature(s) Signed: 11/15/2022 5:05:23 PM By: [name] DO Entered By: [name] on 11/15/2022 16:50:53 -------------------------------------------------------------------------------- Physician Orders Details Patient Name: Date of Service: [place], [name] RIA N 11/15/2022 11:00 A M Medical Record Number: [id number] Patient Account Number: [id number] Date of Birth/Sex: Treating RN: [birth year] (72 y.o. [name] Primary Care Provider: MEDINA-V A Jeri [place], [name] Other Clinician: Referring Provider: Treating Provider/Extender: [name] [name], [name] Weeks in Treatment: 58 Verbal / Phone Orders: No Diagnosis [address] YAMIRA, [name] ([id number]) [id number] Page 3 of 7 Discharge From [facility] Services Discharge from [facility] - Lymphedema / SCD / Other Avoid standing for long periods of time. Patient to wear own compression stockings every day. - apply juxtalites to both legs. apply in morning and remove at night. Electronic Signature(s) Signed: 11/15/2022 5:05:23 PM By: [name] DO Entered By: [name] on  11/15/2022 16:51:01 -------------------------------------------------------------------------------- Problem List Details Patient Name: Date of Service: [place], [name] RIA N 11/15/2022 11:00 A M Medical Record Number: [id number] Patient Account Number: [id number] Date of Birth/Sex: Treating RN: 22-Apr-1950 (72 y.o. F) Primary Care Provider: [facility]  S, [name] Other Clinician: Referring Provider: Treating Provider/Extender: [name] [name], [name] Weeks in Treatment: 17 Active Problems ICD-10 Encounter Code Description Active Date MDM Diagnosis L97.812 Non-pressure chronic ulcer of other part of right lower leg with fat layer 07/19/2022 No Yes exposed L97.822 Non-pressure chronic ulcer of other part of left lower leg with fat layer exposed7/20/2023 No Yes I89.0 Lymphedema, not elsewhere classified 07/19/2022 No Yes I87.333 Chronic venous hypertension (idiopathic) with ulcer and inflammation of 07/19/2022 No Yes bilateral lower extremity E11.622 Type 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 07/19/2022 No Yes 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 07/19/2022 No Yes Inactive Problems Resolved Problems Electronic Signature(s) Signed: 11/15/2022 5:05:23 PM By: [name] DO Entered By: [name] on 11/15/2022 16:49:15 [name] ([id number]) [id number] Page 4 of 7 -------------------------------------------------------------------------------- Progress Note Details Patient Name: Date of Service: [name], [name] 11/15/2022 11:00 A M Medical Record Number: [id number] Patient Account Number: [id number] Date of Birth/Sex: Treating RN: 1950/08/20 (72 y.o. F) Primary Care Provider: [name], [name] Other Clinician: Referring Provider: Treating Provider/Extender: [name] [name], [name] Weeks in Treatment: 17 Subjective Chief Complaint Information obtained from Patient 07/19/2022; bilateral lower  extremity wounds History of Present Illness (HPI) Admission 07/19/2022 [name] Is a 72 year old female with a past medical history of endometrial cancer, lymphedema, insulin-dependent type 2 diabetes that presents to the clinic for a 4-week history of weeping to her lower extremities bilaterally. She visited the ED for this issue on 07/07/2022 and she was prescribed doxycycline For bilateral lower extremity cellulitis. She has completed this course. She currently denies signs of infection. She does not have compression stockings. She states she recently gained weight over the past couple months. She has never had wounds before. 7/28; patient presents for follow-up. We have been using silver alginate under Kerlix/Coban. She is scheduled to get her ABIs with TBI's on 8/7. She states that the wraps slid down to her mid shin. She denies signs of infection. 8/7; patient presents for follow-up. We have been using silver alginate under Kerlix/Coban. She had arterial studies done that showed an ABI of 0.86 on the right and 0.82 on the left with multiphasic waveforms throughout the lower extremities. 8/14; patient presents for follow-up. We have been using silver alginate under 3 layer compression. She tolerated the wrap well. She has no issues or complaints today. 8/21; patient presents for follow-up. We have been using silver alginate under 3 layer compression. She has no issues or complaints today. She has her juxta lite compressions with her. 8/28; patient presents for follow-up. We have been using silver alginate under 3 layer compression. She has no issues or complaints today. 9/5; patient presents for follow-up. We have been using silver alginate under 4 layer compression to both extremities bilaterally. She has her juxta lite compression with her today. 9/12; patient presents for follow-up. We have been using silver alginate under 4-layer compression to the right leg. She was supposed to wear  her juxta lite compression to the left lower extremity. She states she could not get it on so she has not been wearing it. She also has not been taking her Lasix. 9/19; patient presents for follow-up. We have been using silver alginate under 4-layer compression to her lower extremities bilaterally. She has restarted taking her Lasix on a daily basis. 9/26; patient presents for follow-up. We have been using silver alginate under 4-layer compression to the lower extremities bilaterally. Her  left lower extremity wounds have healed. She has juxta lite compressions. 10/12; patient presents for follow-up. Patient had COVID last week and missed her appointment. She states she took her compression wrap off on the right lower extremity. Her wounds were healed on the left lower extremity at last clinic visit but these have since reopened. She has juxta lite compression and states she has been wearing them. Unfortunately she has multiple scattered wounds now to her right lower extremity as well. No signs of infection on exam. We have offered to order her lymphedema pumps however she states she wants to use her husband's. She has not been doing this. Her diuretic is 60 mg of Lasix twice weekly. She has not been contacted for her venous reflux studies. 10/19; patient presents for follow-up. She is now on 80 mg of Lasix twice daily. She reports significant improvement in her leg swelling. Her wounds have improved greatly. We have been using silver alginate under 4-layer compression. The left leg is healed. She does not have her Velcro compression wrap with her. She has 2 open wounds to the right lower extremity limited to skin breakdown with minimal weeping. She denies signs of infection. 10/26; patient presents for follow-up. The left lower extremity wounds Remain closed. Her right lower extremity still has some scattered open wounds limited to skin breakdown. The wrap slid down slightly to the right lower  extremity. She is now taking 80 mg of Lasix in the morning and '40mg'$  in the afternoon. 11/2; the patient was healed out on the left last visit. She did not have a JuxtaLite which she did have was a stocking from [employer]. She used this however predictably the leg is broken down in the left anterior and 2 small areas in the left calf. The right leg really does not look too bad. 11/16; patient presents for follow-up. Her wounds have healed. She has her compression garments with her. Patient History Information obtained from Patient, Chart. Family History Cancer - Father, Diabetes - Siblings. Social History Former smoker - quit 1988, Marital Status - Married, Alcohol Use - Moderate - cocktails x2 a week, Drug Use - No History, Caffeine Use - Never. Medical History Hematologic/Lymphatic Patient has history of Anemia - iron Cardiovascular Patient has history of Arrhythmia - A. Fib 09/10/2022 dx started eliquis, Hypertension [name], [name] ([id number]) [id number] Page 5 of 7 Endocrine Patient has history of Type II Diabetes Musculoskeletal Patient has history of Gout, Osteoarthritis Oncologic Patient has history of Received Radiation - 2023 Hospitalization/Surgery History - one ovary removed 2020. - pilonidal cyst extraction 1969. - diverculitis temp colostomy. Medical A Surgical History Notes nd Cardiovascular hyperlipidemia Gastrointestinal GERD Diverticulitis Musculoskeletal carpel tunnel bilateral-surgery to right hand Oncologic endometrial cancer skin Ca Objective Constitutional respirations regular, non-labored and within target range for patient.. Vitals Time Taken: 11:40 AM, Height: 64 in, Weight: 325 lbs, BMI: 55.8, Temperature: 98 F, Pulse: 76 bpm, Respiratory Rate: 20 breaths/min, Blood Pressure: 102/63 mmHg. Cardiovascular 2+ dorsalis pedis/posterior tibialis pulses. Psychiatric pleasant and cooperative. General Notes: Lower extremities with  epithelization to the previous wound site. Good edema control. Lymphedema skin changes. Integumentary (Hair, Skin) Wound #1RR status is Open. Original cause of wound was Gradually Appeared. The date acquired was: 07/05/2022. The wound has been in treatment 17 weeks. The wound is located on the Left,Circumferential Lower Leg. The wound measures 0cm length x 0cm width x 0cm depth; 0cm^2 area and 0cm^3 volume. There is no tunneling or undermining noted. There is a none present  amount of drainage noted. The wound margin is flat and intact. There is no granulation within the wound bed. There is no necrotic tissue within the wound bed. The periwound skin appearance had no abnormalities noted for texture. The periwound skin appearance had no abnormalities noted for color. The periwound skin appearance exhibited: Dry/Scaly. The periwound skin appearance did not exhibit: Maceration. Periwound temperature was noted as No Abnormality. The periwound has tenderness on palpation. Wound #2 status is Open. Original cause of wound was Gradually Appeared. The date acquired was: 07/05/2022. The wound has been in treatment 17 weeks. The wound is located on the Right,Circumferential Lower Leg. The wound measures 0cm length x 0cm width x 0cm depth; 0cm^2 area and 0cm^3 volume. There is no tunneling or undermining noted. There is a none present amount of drainage noted. The wound margin is flat and intact. There is no granulation within the wound bed. There is no necrotic tissue within the wound bed. The periwound skin appearance had no abnormalities noted for texture. The periwound skin appearance exhibited: Dry/Scaly, Hemosiderin Staining. The periwound skin appearance did not exhibit: Maceration, Atrophie Blanche, Cyanosis, Ecchymosis, Mottled, Pallor, Rubor, Erythema. Periwound temperature was noted as No Abnormality. Assessment Active Problems ICD-10 Non-pressure chronic ulcer of other part of right lower leg with fat  layer exposed Non-pressure chronic ulcer of other part of left lower leg with fat layer exposed Lymphedema, not elsewhere classified Chronic venous hypertension (idiopathic) with ulcer and inflammation of bilateral lower extremity Type 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 Malignant neoplasm of endometrium Patient has done well with silver alginate under 4-layer compression. Her wounds have healed. I recommended compression stockings daily. Follow-up as needed. Plan Discharge From [facility] Services: [name], [name] ([id number]) [id number] Page 6 of 7 Discharge from [place] [facility] - Lymphedema / SCD / Other: Avoid standing for long periods of time. Patient to wear own compression stockings every day. - apply juxtalites to both legs. apply in morning and remove at night. 1. Compression stockings daily 2. Discharge from clinic due to closed wound 3. Follow-up as needed Electronic Signature(s) Signed: 11/15/2022 5:05:23 PM By: [name] DO Entered By: [name] on 11/15/2022 16:52:10 -------------------------------------------------------------------------------- HxROS Details Patient Name: Date of Service: [name] Ley[place], [place] RIA N 11/15/2022 11:00 A M Medical Record Number: [id number] Patient Account Number: [id number] Date of Birth/Sex: Treating RN: [birth year] (72 y.o. F) Primary Care Provider: [name], [name] Other Clinician: Referring Provider: Treating Provider/Extender: [name] [name], [name] Weeks in Treatment: 17 Information Obtained From Patient Chart Hematologic/Lymphatic Medical History: Positive for: Anemia - iron Cardiovascular Medical History: Positive for: Arrhythmia - A. Fib 09/10/2022 dx started eliquis; Hypertension Past Medical History Notes: hyperlipidemia Gastrointestinal Medical History: Past Medical History Notes: GERD Diverticulitis Endocrine Medical History: Positive for:  Type II Diabetes Time with diabetes: 72 years old Treated with: Insulin, Diet Blood sugar tested every day: Yes Tested : three times a week. Musculoskeletal Medical History: Positive for: Gout; Osteoarthritis Past Medical History Notes: carpel tunnel bilateral-surgery to right hand Oncologic Medical History: Positive for: Received Radiation - 2023 Past Medical History Notes: endometrial cancer skin Ca [name], [name] ([id number]) [id number] Page 7 of 7 Immunizations Pneumococcal Vaccine: Received Pneumococcal Vaccination: Yes Received Pneumococcal Vaccination On or After 60th Birthday: No Implantable Devices No devices added Hospitalization / Surgery History Type of Hospitalization/Surgery one ovary removed 2020 pilonidal cyst extraction 1969 diverculitis temp colostomy Family and Social History Cancer: Yes - Father; Diabetes: Yes -  Siblings; Former smoker - quit 1988; Marital Status - Married; Alcohol Use: Moderate - cocktails x2 a week; Drug Use: No History; Caffeine Use: Never; Financial Concerns: No; Food, Clothing or Shelter Needs: No; Support System Lacking: No; Transportation Concerns: No Electronic Signature(s) Signed: 11/15/2022 5:05:23 PM By: [name] DO Entered By: [name] on 11/15/2022 16:50:26 -------------------------------------------------------------------------------- SuperBill Details Patient Name: Date of Service: [name], [place] 11/15/2022 Medical Record Number: [id number] Patient Account Number: [id number] Date of Birth/Sex: Treating RN: August 19, 1950 (72 y.o. Tonita Phoenix, Lauren Primary Care Provider: [name], [name] Other Clinician: Referring Provider: Treating Provider/Extender: [name] [name], [name] Weeks in Treatment: 17 Diagnosis Coding ICD-10 Codes Code Description [phone] 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d L97.822  Non-pressure chronic ulcer of other part of left lower leg with fat layer exposed I89.0 Lymphedema, not elsewhere classified I87.333 Chronic venous hypertension (idiopathic) with ulcer and inflammation of bilateral lower extremity E11.622 Type 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 Facility Procedures : CPT4 Code: [id number] Description: 67591 - WOUND CARE VISIT-LEV 3 EST PT Modifier: Quantity: 1 Physician Procedures : CPT4 Code Description Modifier [id number] - WC PHYS LEVEL 3 - EST PT ICD-10 Diagnosis Description S17.793 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d L97.822 Non-pressure chronic ulcer of other part of left  lower leg with fat layer exposed I87.333 Chronic venous hypertension (idiopathic) with ulcer and inflammation of bilateral lower extremity I89.0 Lymphedema, not elsewhere classified Quantity: 1 Electronic Signature(s) Signed: 11/15/2022 5:05:23 PM By: [name] DO Entered By: [name] on 11/15/2022 16:52:25

## 2022-11-16 NOTE — Progress Notes (Signed)
DESA, RECH ([id number]) [id number] Page 1 of 9 Visit Report for 11/15/2022 Arrival Information Details Patient Name: Date of Service: [name], [name] 11/15/2022 11:00 A M Medical Record Number: [id number] Patient Account Number: [id number] Date of Birth/Sex: Treating RN: [birth year] (72 y.o. [name] Primary Care Jeniyah Menor: [name] Other Clinician: Referring Fayetta Sorenson: Treating Pasquale Matters/Extender: [name] [name] Weeks in Treatment: 38 Visit Information History Since Last Visit Added or deleted any medications: No Patient Arrived: Wheel Chair Any new allergies or adverse reactions: No Arrival Time: 11:40 Had a fall or experienced change in No Accompanied By: self activities of daily living that may affect Transfer Assistance: None risk of falls: Patient Identification Verified: Yes Signs or symptoms of abuse/neglect since last visito No Secondary Verification Process Completed: Yes Hospitalized since last visit: No Patient Requires Transmission-Based No Implantable device outside of the clinic excluding No Precautions: cellular tissue based products placed in the center Patient Has Alerts: Yes since last visit: Patient Alerts: Patient on Blood Thinner Has Dressing in Place as Prescribed: Yes 08/06/2022 TBI L 0.59 R0.65 Has Compression in Place as Prescribed: Yes 08/06/2022 ABI R 0.86 Pain Present Now: No L0.82 Electronic Signature(s) Signed: 11/15/2022 4:44:04 PM By: Adline Peals Entered By: Adline Peals on 11/15/2022 11:40:44 -------------------------------------------------------------------------------- Clinic Level of Care Assessment Details Patient Name: Date of Service: [name], [name] 11/15/2022 11:00 A M Medical Record Number: [id number] Patient Account Number: [id number] Date of Birth/Sex: Treating RN: 17-Jan-1950 (72 y.o. [name] Primary Care Smiley Birr: [name] Other Clinician: Referring Myha Arizpe: Treating Neizan Debruhl/Extender: [name] [name] Weeks in Treatment: 17 Clinic Level of Care Assessment Items TOOL 4 Quantity Score X- 1 0 Use when only an EandM is performed on FOLLOW-UP visit ASSESSMENTS - Nursing Assessment / Reassessment X- 1 10 Reassessment of Co-morbidities (includes updates in patient status) X- 1 5 Reassessment of Adherence to Treatment Plan ASSESSMENTS - Wound and Skin A ssessment / Reassessment X - Simple Wound Assessment / Reassessment - one wound 1 5 '[]'$  - 0 Complex Wound Assessment / Reassessment - multiple wounds '[]'$  - 0 Dermatologic / Skin Assessment (not related to wound area) ASSESSMENTS - Focused Assessment X- 1 5 Circumferential Edema Measurements - multi extremities '[]'$  - 0 Nutritional Assessment / Counseling / Intervention [name], [name] ([id number]) [id number] Page 2 of 9 '[]'$  - 0 Lower Extremity Assessment (monofilament, tuning fork, pulses) '[]'$  - 0 Peripheral Arterial Disease Assessment (using hand held doppler) ASSESSMENTS - Ostomy and/or Continence Assessment and Care '[]'$  - 0 Incontinence Assessment and Management '[]'$  - 0 Ostomy Care Assessment and Management (repouching, etc.) PROCESS - Coordination of Care X - Simple Patient / Family Education for ongoing care 1 15 '[]'$  - 0 Complex (extensive) Patient / Family Education for ongoing care X- 1 10 Staff obtains Programmer, systems, Records, T Results / Process Orders est '[]'$  - 0 Staff telephones HHA, Nursing Homes / Clarify orders / etc '[]'$  - 0 Routine Transfer to another Facility (non-emergent condition) '[]'$  - 0 Routine Hospital Admission (non-emergent condition) '[]'$  - 0 New Admissions / Biomedical engineer / Ordering NPWT Apligraf, etc. , '[]'$  - 0 Emergency Hospital Admission (emergent condition) X- 1 10 Simple Discharge Coordination '[]'$  - 0 Complex (extensive) Discharge Coordination PROCESS -  Special Needs '[]'$  - 0 Pediatric / Minor Patient Management '[]'$  - 0 Isolation Patient Management '[]'$  - 0 Hearing / Language / Visual special needs '[]'$  -  0 Assessment of Community assistance (transportation, D/C planning, etc.) '[]'$  - 0 Additional assistance / Altered mentation '[]'$  - 0 Support Surface(s) Assessment (bed, cushion, seat, etc.) INTERVENTIONS - Wound Cleansing / Measurement X - Simple Wound Cleansing - one wound 1 5 '[]'$  - 0 Complex Wound Cleansing - multiple wounds X- 1 5 Wound Imaging (photographs - any number of wounds) '[]'$  - 0 Wound Tracing (instead of photographs) X- 1 5 Simple Wound Measurement - one wound '[]'$  - 0 Complex Wound Measurement - multiple wounds INTERVENTIONS - Wound Dressings X - Small Wound Dressing one or multiple wounds 1 10 '[]'$  - 0 Medium Wound Dressing one or multiple wounds '[]'$  - 0 Large Wound Dressing one or multiple wounds '[]'$  - 0 Application of Medications - topical '[]'$  - 0 Application of Medications - injection INTERVENTIONS - Miscellaneous '[]'$  - 0 External ear exam '[]'$  - 0 Specimen Collection (cultures, biopsies, blood, body fluids, etc.) '[]'$  - 0 Specimen(s) / Culture(s) sent or taken to Lab for analysis '[]'$  - 0 Patient Transfer (multiple staff / Civil Service fast streamer / Similar devices) '[]'$  - 0 Simple Staple / Suture removal (25 or less) '[]'$  - 0 Complex Staple / Suture removal (26 or more) '[]'$  - 0 Hypo / Hyperglycemic Management (close monitor of Blood Glucose) [place], [name] ([id number]) [id number] Page 3 of 9 '[]'$  - 0 Ankle / Brachial Index (ABI) - do not check if billed separately X- 1 5 Vital Signs Has the patient been seen at the hospital within the last three years: Yes Total Score: 90 Level Of Care: New/Established - Level 3 Electronic Signature(s) Signed: 11/16/2022 12:09:11 PM By: Rhae Hammock RN Entered By: Rhae Hammock on 11/15/2022  12:23:26 -------------------------------------------------------------------------------- Encounter Discharge Information Details Patient Name: Date of Service: [name], [name] 11/15/2022 11:00 A M Medical Record Number: [id number] Patient Account Number: [id number] Date of Birth/Sex: Treating RN: 1950-01-05 (72 y.o. [name] Primary Care Hiren [name]: [name] [name] Other Clinician: Referring Alissah Redmon: Treating Danija Gosa/Extender: [name] [name] Weeks in Treatment: 17 Encounter Discharge Information Items Discharge Condition: Stable Ambulatory Status: Ambulatory Discharge Destination: Home Transportation: Private Auto Accompanied By: self Schedule Follow-up Appointment: Yes Clinical Summary of Care: Patient Declined Electronic Signature(s) Signed: 11/16/2022 12:09:11 PM By: Rhae Hammock RN Entered By: Rhae Hammock on 11/15/2022 12:24:16 -------------------------------------------------------------------------------- Lower Extremity Assessment Details Patient Name: Date of Service: [name], [name] 11/15/2022 11:00 A M Medical Record Number: [id number] Patient Account Number: [id number] Date of Birth/Sex: Treating RN: [birth year] (72 y.o. [name] Primary Care Josclyn Rosales: [name] [name] Other Clinician: Referring Hartley Wyke: Treating Ambur Province/Extender: [name] [name] Weeks in Treatment: 17 Edema Assessment Assessed: [Left: No] [Right: No] Edema: [Left: Yes] [Right: Yes] Calf Left: Right: Point of Measurement: 39 cm From Medial Instep 43.7 cm 42 cm Ankle Left: Right: Point of Measurement: 10 cm From Medial Instep 27 cm 26.3 cm Vascular Assessment Left: [[id number] Page 4 of 9Right:] Pulses: Dorsalis Pedis Palpable: [[id number] Page 4 of 9Yes Yes] Electronic Signature(s) Signed: 11/15/2022 4:44:04 PM By: Adline Peals Entered By: Adline Peals on 11/15/2022 11:50:00 -------------------------------------------------------------------------------- Multi Wound Chart Details Patient Name: Date of Service: [name], [name] 11/15/2022 11:00 A M Medical Record Number: [id number] Patient Account Number: [id number] Date of Birth/Sex: Treating RN: Dec 20, 1950 (72 y.o. F) Primary Care Jamonica Schoff: [name] [name] Other Clinician: Referring Bret Vanessen: Treating Capucine Tryon/Extender: [name] [name] Weeks  in Treatment: 17 Vital Signs Height(in): 64 Pulse(bpm): 76 Weight(lbs): 325 Blood Pressure(mmHg): 102/63 Body Mass Index(BMI): 55.8 Temperature(F): 98 Respiratory Rate(breaths/min): 20 [1RR:Photos:] [[name]:[name]] Left, Circumferential Lower Leg Right, Circumferential Lower Leg [name] Wound Location: Gradually Appeared Gradually Appeared [name] Wounding Event: Lymphedema Lymphedema [name] Primary Etiology: Anemia, Arrhythmia, Hypertension, Anemia, Arrhythmia, Hypertension, [name] Comorbid History: Type II Diabetes, Gout, Osteoarthritis, Type II Diabetes, Gout, Osteoarthritis, Received Radiation Received Radiation 07/05/2022 07/05/2022 [name] Date Acquired: 17 17 [name] Weeks of Treatment: Open Open [name] Wound Status: Yes No [name] Wound Recurrence: Yes Yes [name] Clustered Wound: 5 10 [name] Clustered Quantity: 0x0x0 0x0x0 [name] Measurements L x W x D (cm) 0 0 [name] A (cm) : rea 0 0 [name] Volume (cm) : 100.00% 100.00% [name] % Reduction in Area: 100.00% 100.00% [name] % Reduction in Volume: Full Thickness Without Exposed Full Thickness Without Exposed [name] Classification: Support Structures Support Structures None Present None Present [name] Exudate Amount: Flat and Intact Flat and Intact [name] Wound Margin: None Present (0%) None Present (0%) [name] Granulation Amount: None Present (0%) None Present (0%) [name] Necrotic Amount: Fascia: No Fascia: No [name] Exposed Structures: Fat Layer  (Subcutaneous Tissue): No Fat Layer (Subcutaneous Tissue): No Tendon: No Tendon: No Muscle: No Muscle: No Joint: No Joint: No Bone: No Bone: No Large (67-100%) Large (67-100%) [name] Epithelialization: Excoriation: No Excoriation: No [name] Periwound Skin Texture: Induration: No Induration: No Callus: No Callus: No Crepitus: No Crepitus: No [name], [name] ([id number]) [id number] Page 5 of 9 Rash: No Rash: No Scarring: No Scarring: No Dry/Scaly: Yes Dry/Scaly: Yes [name] Periwound Skin Moisture: Maceration: No Maceration: No Hemosiderin Staining: Yes Hemosiderin Staining: Yes [name] Periwound Skin Color: Atrophie Blanche: No Atrophie Blanche: No Cyanosis: No Cyanosis: No Ecchymosis: No Ecchymosis: No Erythema: No Erythema: No Mottled: No Mottled: No Pallor: No Pallor: No Rubor: No Rubor: No No Abnormality No Abnormality [name] Temperature: Yes [name] [name] Tenderness on Palpation: Treatment Notes Wound #1RR (Lower Leg) Wound Laterality: Left, Circumferential Cleanser Peri-Wound Care Topical Primary Dressing Secondary Dressing Secured With Compression Wrap Compression Stockings Add-Ons Wound #2 (Lower Leg) Wound Laterality: Right, Circumferential Cleanser Peri-Wound Care Topical Primary Dressing Secondary Dressing Secured With Compression Wrap Compression Stockings Add-Ons Electronic Signature(s) Signed: 11/15/2022 5:05:23 PM By: [name] DO Entered By: [name] on 11/15/2022 16:49:21 -------------------------------------------------------------------------------- Multi-Disciplinary Care Plan Details Patient Name: Date of Service: [name], [name] 11/15/2022 11:00 A M Medical Record Number: [id number] Patient Account Number: [id number] Date of Birth/Sex: Treating RN: 25-Mar-1950 (72 y.o. [name] Primary Care Naraya Stoneberg: [facility] [name] Other Clinician: Referring Gavriella Hearst: Treating Tytan Sandate/Extender:  [name] [name] Weeks in Treatment: [address] Viola, Nickol ([id number]) [id number] Page 6 of 9 Electronic Signature(s) Signed: 11/16/2022 12:09:11 PM By: Rhae Hammock RN Entered By: Rhae Hammock on 11/15/2022 12:25:49 -------------------------------------------------------------------------------- Pain Assessment Details Patient Name: Date of Service: [name], [name] 11/15/2022 11:00 A M Medical Record Number: [id number] Patient Account Number: [id number] Date of Birth/Sex: Treating RN: 1950-01-12 (72 y.o. [name] Primary Care Nicholaus Steinke: [name] Other Clinician: Referring Mirta Mally: Treating Harlo Jaso/Extender: [name] [name] [name] Weeks in Treatment: 17 Active Problems Location of Pain Severity and Description of Pain Patient Has Paino No Site Locations Rate the pain. Current Pain Level: 0 Pain Management and Medication Current Pain Management: Electronic Signature(s) Signed: 11/15/2022 4:44:04 PM By: Adline Peals Entered By: Adline Peals on 11/15/2022 11:41:09 -------------------------------------------------------------------------------- Patient/Caregiver Education Details Patient Name: Date  of Service: [name], [name] 11/16/2023andnbsp11:00 A M Medical Record Number: [id number] Patient Account Number: [id number] Date of Birth/Gender: Treating RN: December 17, 1950 (72 y.o. [name] Primary Care Physician: [facility] [name] Other Clinician: Referring Physician: Treating Physician/Extender: [name] [name] Weeks in Treatment: 17 Education Assessment Education Provided ToALAINA, DONATI ([id number]) [id number] Page 7 of 9 Patient Education Topics Provided Pain: Methods: Explain/Verbal Responses: State content correctly Venous: Methods: Explain/Verbal Responses: State  content correctly Electronic Signature(s) Signed: 11/16/2022 12:09:11 PM By: Rhae Hammock RN Entered By: Rhae Hammock on 11/15/2022 11:42:04 -------------------------------------------------------------------------------- Wound Assessment Details Patient Name: Date of Service: [name], [name] 11/15/2022 11:00 A M Medical Record Number: [id number] Patient Account Number: [id number] Date of Birth/Sex: Treating RN: 1950-12-29 (72 y.o. [name] Primary Care Marykay Mccleod: [name] [name] Other Clinician: Referring Onis Markoff: Treating Nyeisha Goodall/Extender: [name] [name] Weeks in Treatment: 17 Wound Status Wound Number: 1RR Primary Lymphedema Etiology: Wound Location: Left, Circumferential Lower Leg Wound Open Wounding Event: Gradually Appeared Status: Date Acquired: 07/05/2022 Comorbid Anemia, Arrhythmia, Hypertension, Type II Diabetes, Gout, Weeks Of Treatment: 17 History: Osteoarthritis, Received Radiation Clustered Wound: Yes Photos Wound Measurements Length: (cm) Width: (cm) Depth: (cm) Clustered Quantity: Area: (cm) Volume: (cm) 0 % Reduction in Area: 100% 0 % Reduction in Volume: 100% 0 Epithelialization: Large (67-100%) 5 Tunneling: No 0 Undermining: No 0 Wound Description Classification: Full Thickness Without Exposed Sup Wound Margin: Flat and Intact Exudate Amount: None Present port Structures Foul Odor After Cleansing: No Slough/Fibrino No Wound Bed Granulation Amount: None Present (0%) Exposed Structure Necrotic Amount: None Present (0%) Fascia Exposed: No Fat Layer (Subcutaneous Tissue) Exposed: No Tendon Exposed: No [place], Jinny Sanders ([id number]) [id number] Page 8 of 9 Muscle Exposed: No Joint Exposed: No Bone Exposed: No Periwound Skin Texture Texture Color No Abnormalities Noted: Yes No Abnormalities Noted: Yes Moisture Temperature / Pain No Abnormalities Noted:  No Temperature: No Abnormality Dry / Scaly: Yes Tenderness on Palpation: Yes Maceration: No Electronic Signature(s) Signed: 11/15/2022 4:44:04 PM By: Adline Peals Entered By: Adline Peals on 11/15/2022 11:49:09 -------------------------------------------------------------------------------- Wound Assessment Details Patient Name: Date of Service: [name] [place], [name] 11/15/2022 11:00 A M Medical Record Number: [id number] Patient Account Number: [id number] Date of Birth/Sex: Treating RN: 24-Dec-1950 (72 y.o. [name] Primary Care Hayzlee Mcsorley: [name] [name] Other Clinician: Referring Zonia Caplin: Treating Patrese Neal/Extender: [name] [name] [name] Weeks in Treatment: 17 Wound Status Wound Number: 2 Primary Lymphedema Etiology: Wound Location: Right, Circumferential Lower Leg Wound Open Wounding Event: Gradually Appeared Status: Date Acquired: 07/05/2022 Comorbid Anemia, Arrhythmia, Hypertension, Type II Diabetes, Gout, Weeks Of Treatment: 17 History: Osteoarthritis, Received Radiation Clustered Wound: Yes Photos Wound Measurements Length: (cm) Width: (cm) Depth: (cm) Clustered Quantity: Area: (cm) Volume: (cm) 0 % Reduction in Area: 100% 0 % Reduction in Volume: 100% 0 Epithelialization: Large (67-100%) 10 Tunneling: No 0 Undermining: No 0 Wound Description Classification: Full Thickness Without Exposed Sup Wound Margin: Flat and Intact Exudate Amount: None Present port Structures Foul Odor After Cleansing: No Slough/Fibrino No Wound Bed Granulation Amount: None Present (0%) Exposed Structure Necrotic Amount: None Present (0%) Fascia Exposed: No Fat Layer (Subcutaneous Tissue) Exposed: No Tendon Exposed: No Mcneice, Jinny Sanders ([id number]) [id number] Page 9 of 9 Muscle Exposed: No Joint Exposed: No Bone Exposed: No Periwound Skin Texture Texture Color No Abnormalities Noted: Yes No Abnormalities  Noted: No Atrophie Blanche: No Moisture Cyanosis:  No No Abnormalities Noted: No Ecchymosis: No Dry / Scaly: Yes Erythema: No Maceration: No Hemosiderin Staining: Yes Mottled: No Pallor: No Rubor: No Temperature / Pain Temperature: No Abnormality Electronic Signature(s) Signed: 11/15/2022 4:44:04 PM By: Adline Peals Entered By: Adline Peals on 11/15/2022 11:49:30 -------------------------------------------------------------------------------- Vitals Details Patient Name: Date of Service: [name], [name] 11/15/2022 11:00 A M Medical Record Number: [id number] Patient Account Number: [id number] Date of Birth/Sex: Treating RN: [birth year] (72 y.o. [name] Primary Care Haunani Dickard: [name] [name] Other Clinician: Referring Felcia Huebert: Treating Patty Lopezgarcia/Extender: [name] [name] Weeks in Treatment: 17 Vital Signs Time Taken: 11:40 Temperature (F): 98 Height (in): 64 Pulse (bpm): 76 Weight (lbs): 325 Respiratory Rate (breaths/min): 20 Body Mass Index (BMI): 55.8 Blood Pressure (mmHg): 102/63 Reference Range: 80 - 120 mg / dl Electronic Signature(s) Signed: 11/15/2022 4:44:04 PM By: Adline Peals Entered By: Adline Peals on 11/15/2022 11:42:16

## 2022-11-20 ENCOUNTER — Other Ambulatory Visit: Payer: Medicare Other

## 2022-11-20 ENCOUNTER — Encounter (HOSPITAL_BASED_OUTPATIENT_CLINIC_OR_DEPARTMENT_OTHER): Payer: Medicare Other | Admitting: Internal Medicine

## 2022-11-21 ENCOUNTER — Other Ambulatory Visit: Payer: Self-pay | Admitting: Adult Health

## 2022-11-21 DIAGNOSIS — M1A9XX0 Chronic gout, unspecified, without tophus (tophi): Secondary | ICD-10-CM

## 2022-11-21 DIAGNOSIS — I10 Essential (primary) hypertension: Secondary | ICD-10-CM

## 2022-11-21 DIAGNOSIS — N3281 Overactive bladder: Secondary | ICD-10-CM

## 2022-11-26 ENCOUNTER — Ambulatory Visit (INDEPENDENT_AMBULATORY_CARE_PROVIDER_SITE_OTHER): Payer: Medicare Other | Admitting: Adult Health

## 2022-11-26 ENCOUNTER — Encounter: Payer: Self-pay | Admitting: Adult Health

## 2022-11-26 ENCOUNTER — Ambulatory Visit: Payer: Medicare Other | Attending: Internal Medicine

## 2022-11-26 VITALS — BP 136/82 | HR 86 | Temp 91.5°F | Ht 64.0 in | Wt 301.0 lb

## 2022-11-26 DIAGNOSIS — M1A9XX0 Chronic gout, unspecified, without tophus (tophi): Secondary | ICD-10-CM | POA: Diagnosis not present

## 2022-11-26 DIAGNOSIS — I10 Essential (primary) hypertension: Secondary | ICD-10-CM | POA: Diagnosis not present

## 2022-11-26 DIAGNOSIS — N3281 Overactive bladder: Secondary | ICD-10-CM

## 2022-11-26 DIAGNOSIS — I480 Paroxysmal atrial fibrillation: Secondary | ICD-10-CM

## 2022-11-26 DIAGNOSIS — E782 Mixed hyperlipidemia: Secondary | ICD-10-CM | POA: Diagnosis not present

## 2022-11-26 DIAGNOSIS — E118 Type 2 diabetes mellitus with unspecified complications: Secondary | ICD-10-CM | POA: Diagnosis not present

## 2022-11-26 DIAGNOSIS — I4891 Unspecified atrial fibrillation: Secondary | ICD-10-CM | POA: Diagnosis not present

## 2022-11-26 DIAGNOSIS — Z79899 Other long term (current) drug therapy: Secondary | ICD-10-CM | POA: Diagnosis not present

## 2022-11-26 NOTE — Progress Notes (Signed)
[facility] clinic  Provider:  Jaymes Graff Medina-Vargas  Code Status: Full Code  Goals of Care:     07/30/2022    9:49 AM  Advanced Directives  Does Patient Have a Medical Advance Directive? No  Would patient like information on creating a medical advance directive? No - Patient declined     Chief Complaint  Patient presents with   Medical Management of Chronic Issues    Patient presents today for a 3 month follow-up    HPI: Patient is a 72 y.o. female seen today for a routine medical management of chronic issues.   Primary hypertension - BP 136/82, takes Amlodipine, Losartan and Carvedilol  PAF (paroxysmal atrial fibrillation) (HCC)  -  HR  86, controlled, takes Eliquis and Carvedilol  Chronic gout without tophus, unspecified cause, unspecified site -  denies flare ups, takes Allopurinol  Mixed hyperlipidemia -  Atorvastatin  Diabetes mellitus with complication (HCC) -   CBGs  averaging in the 170s to 180s, takes NPH-regula (70-30) insulin  Morbid (severe) obesity due to excess calories (HCC) wt 301 lbs, had 39 lbs weight loss for 3 months, takes Furosemide   OAB (overactive bladder) -  takes Oxybutynin    Past Medical History:  Diagnosis Date   Arthritis    Carpal tunnel syndrome    bilateral, had surgery on right hand   Diabetes mellitus without complication ([place])    Diverticulitis    Endometrial cancer ([place])    GERD (gastroesophageal reflux disease)    Gout    History of iron deficiency    Hyperlipidemia    Hypertension    Skin cancer     Past Surgical History:  Procedure Laterality Date   CARPAL TUNNEL RELEASE Right    COLON SURGERY     COLONOSCOPY     x2   COLOSTOMY     COLOSTOMY REVERSAL     DILATATION & CURETTAGE/HYSTEROSCOPY WITH MYOSURE N/A 07/17/2019   Procedure: DILATATION & CURETTAGE/HYSTEROSCOPY WITH MYOSURE;  Surgeon: Louretta Shorten, MD;  Location: [place];  Service: Gynecology;  Laterality: N/A;   OOPHORECTOMY     unilateral    PILONIDAL CYST EXCISION     ROBOTIC ASSISTED TOTAL HYSTERECTOMY WITH BILATERAL SALPINGO OOPHERECTOMY N/A 08/20/2019   Procedure: DIAGNSOTIC LAPAROSCOPY, DILATION AND CURETTAGE, IUD PLACEMENT ;  Surgeon: Everitt Amber, MD;  Location: [facility];  Service: Gynecology;  Laterality: N/A;   SKIN CANCER EXCISION      Allergies  Allergen Reactions   Lisinopril Cough    Other reaction(s): cough, Not available   Actos [Pioglitazone]     Unknown reaction   Metformin Other (See Comments)   Metformin And Related     Gas     Outpatient Encounter Medications as of 11/26/2022  Medication Sig   allopurinol (ZYLOPRIM) 300 MG tablet TAKE 1 TABLET BY MOUTH EVERY DAY   amLODipine (NORVASC) 5 MG tablet TAKE 1 TABLET (5 MG TOTAL) BY MOUTH DAILY.   apixaban (ELIQUIS) 5 MG TABS tablet Take 1 tablet (5 mg total) by mouth 2 (two) times daily.   atorvastatin (LIPITOR) 80 MG tablet Take 1 tablet (80 mg total) by mouth at bedtime.   Carboxymethylcellul-Glycerin (LUBRICATING EYE DROPS OP) Place 1 drop into both eyes daily as needed (dry eyes).   carvedilol (COREG) 12.5 MG tablet Take 1 tablet (12.5 mg total) by mouth 2 (two) times daily with a meal.   Cholecalciferol (VITAMIN D3 PO) Take 1 capsule by mouth every evening.    Coenzyme  Q10 (COQ10) 100 MG CAPS Take 100 mg by mouth every evening.   diphenhydrAMINE (BENADRYL) 25 mg capsule Take 50 mg by mouth at bedtime.   famotidine (PEPCID) 20 MG tablet Take 20 mg by mouth daily.    furosemide (LASIX) 40 MG tablet Take 2 tablets (80 mg) in the morning and 1 tablet (40 mg) in the evening.   GLUCOSAMINE-CHONDROITIN PO Take 1 tablet by mouth 2 (two) times daily.   insulin NPH-regular Human (70-30) 100 UNIT/ML injection Inject 70 Units into the skin daily. 70 units In the morning and 50 units in the evening   KRILL OIL PO Take 1 capsule by mouth daily.   losartan (COZAAR) 50 MG tablet Take 50 mg by mouth daily.   meclizine (ANTIVERT) 12.5 MG tablet Take 1 tablet (12.5 mg  total) by mouth 3 (three) times daily as needed for dizziness.   Multiple Vitamins-Minerals (MULTIVITAMIN WITH MINERALS) tablet Take 1 tablet by mouth daily.   oxybutynin (DITROPAN-XL) 10 MG 24 hr tablet TAKE 1 TABLET BY MOUTH EVERYDAY AT BEDTIME   potassium chloride (KLOR-CON) 10 MEQ tablet Take 1 tablet (10 mEq total) by mouth 2 (two) times daily.   [DISCONTINUED] losartan (COZAAR) 50 MG tablet Take 1 tablet (50 mg total) by mouth daily.   No facility-administered encounter medications on file as of 11/26/2022.    Review of Systems:  Review of Systems  Constitutional:  Negative for appetite change, chills, fatigue and fever.  HENT:  Negative for congestion, hearing loss, rhinorrhea and sore throat.   Eyes: Negative.   Respiratory:  Negative for cough, shortness of breath and wheezing.   Cardiovascular:  Negative for chest pain, palpitations and leg swelling.  Gastrointestinal:  Negative for abdominal pain, constipation, diarrhea, nausea and vomiting.  Genitourinary:  Negative for dysuria.  Musculoskeletal:  Negative for arthralgias, back pain and myalgias.  Skin:  Negative for color change, rash and wound.  Neurological:  Negative for dizziness, weakness and headaches.  Psychiatric/Behavioral:  Negative for behavioral problems. The patient is not nervous/anxious.     Health Maintenance  Topic Date Due   Medicare Annual Wellness (AWV)  Never done   FOOT EXAM  Never done   OPHTHALMOLOGY EXAM  Never done   Hepatitis C Screening  Never done   Zoster Vaccines- Shingrix (1 of 2) Never done   COLONOSCOPY (Pts 45-70yr Insurance coverage will need to be confirmed)  Never done   MAMMOGRAM  Never done   DEXA SCAN  Never done   Diabetic kidney evaluation - Urine ACR  12/03/2020   INFLUENZA VACCINE  07/31/2022   COVID-19 Vaccine (5 - 2023-24 season) 08/31/2022   HEMOGLOBIN A1C  01/23/2023   Diabetic kidney evaluation - GFR measurement  11/02/2023   Pneumonia Vaccine 72 Years old   Completed   HPV VACCINES  Aged Out    Physical Exam: Vitals:   11/26/22 1003  BP: 136/82  Pulse: 86  Temp: (!) 91.5 F (33.1 C)  SpO2: 98%  Weight: (!) 301 lb (136.5 kg)  Height: '5\' 4"'$  (1.626 m)   Body mass index is 51.67 kg/m. Physical Exam Constitutional:      Appearance: She is obese.  HENT:     Head: Normocephalic and atraumatic.     Nose: Nose normal.     Mouth/Throat:     Mouth: Mucous membranes are moist.  Eyes:     Conjunctiva/sclera: Conjunctivae normal.  Cardiovascular:     Rate and Rhythm: Normal rate and regular rhythm.  Pulmonary:     Effort: Pulmonary effort is normal.     Breath sounds: Normal breath sounds.  Abdominal:     General: Bowel sounds are normal.     Palpations: Abdomen is soft.  Musculoskeletal:        General: Normal range of motion.     Cervical back: Normal range of motion.  Skin:    General: Skin is warm and dry.  Neurological:     General: No focal deficit present.     Mental Status: She is alert and oriented to person, place, and time.  Psychiatric:        Mood and Affect: Mood normal.        Behavior: Behavior normal.        Thought Content: Thought content normal.        Judgment: Judgment normal.     Labs reviewed: Basic Metabolic Panel: Recent Labs    07/23/22 0000 08/07/22 1103 09/10/22 1601 09/27/22 1205 10/16/22 1316 11/01/22 1126  NA 144   < > 142 141 142 139  K 4.9   < > 4.4 4.3 5.0 4.9  CL 107   < > 103 105 102 99  CO2 31*   < > '22 23 25 28  '$ GLUCOSE  --    < > 169* 163* 170* 207*  BUN 19   < > 14 23 30* 31*  CREATININE 0.9   < > 0.99 1.13* 1.43* 1.37*  CALCIUM 9.7   < > 9.6 9.4 9.3 10.0  TSH 6.84*  --  6.020*  --   --   --    < > = values in this interval not displayed.   Liver Function Tests: Recent Labs    07/23/22 0000  AST 18  ALT 18  ALBUMIN 3.6   No results for input(s): "LIPASE", "AMYLASE" in the last 8760 hours. No results for input(s): "AMMONIA" in the last 8760 hours. CBC: Recent  Labs    07/23/22 0000  WBC 6.0  HGB 9.8*  HCT 30*  PLT 332   Lipid Panel: Recent Labs    07/23/22 0000  CHOL 187  HDL 31*  LDLCALC 124  TRIG 177*   Lab Results  Component Value Date   HGBA1C 5.6 07/23/2022    Procedures since last visit: No results found.  Assessment/Plan  1. Primary hypertension -Blood pressure well controlled Continue current medications  2. PAF (paroxysmal atrial fibrillation) (HCC) -  rate-controlled -  continue Eliquis for anticoagulation and carvedilol for rate control  3. Chronic gout without tophus, unspecified cause, unspecified site -   No gout flares, continue allopurinol  4. Mixed hyperlipidemia Lab Results  Component Value Date   CHOL 187 07/23/2022   HDL 31 (A) 07/23/2022   LDLCALC 124 07/23/2022   TRIG 177 (A) 07/23/2022   -   Continue atorvastatin  5. Diabetes mellitus with complication [facility]) Lab Results  Component Value Date   HGBA1C 5.6 07/23/2022   -  stable, continue NPH-regular (70-30) insulin -  monitor CBGs and log daily  6. Morbid (severe) obesity due to excess calories ([place]) Wt Readings from Last 3 Encounters:  11/26/22 (!) 301 lb (136.5 kg)  09/28/22 (!) 345 lb (156.5 kg)  09/10/22 (!) 348 lb (157.9 kg)  Body mass index is 51.67 kg/m.  -  counseled  7. OAB (overactive bladder) -   stable, continue Oxybutynin     Labs/tests ordered:   none, 3 months check memory  Next appt:  3 months

## 2022-11-26 NOTE — Patient Instructions (Signed)

## 2022-11-28 LAB — BASIC METABOLIC PANEL
BUN/Creatinine Ratio: 28 (ref 12–28)
BUN: 38 mg/dL — ABNORMAL HIGH (ref 8–27)
CO2: 19 mmol/L — ABNORMAL LOW (ref 20–29)
Calcium: 9.9 mg/dL (ref 8.7–10.3)
Chloride: 104 mmol/L (ref 96–106)
Creatinine, Ser: 1.35 mg/dL — ABNORMAL HIGH (ref 0.57–1.00)
Glucose: 147 mg/dL — ABNORMAL HIGH (ref 70–99)
Potassium: 4.3 mmol/L (ref 3.5–5.2)
Sodium: 141 mmol/L (ref 134–144)
eGFR: 41 mL/min/{1.73_m2} — ABNORMAL LOW (ref >59)

## 2022-11-29 ENCOUNTER — Telehealth: Payer: Self-pay

## 2022-11-29 DIAGNOSIS — Z79899 Other long term (current) drug therapy: Secondary | ICD-10-CM

## 2022-11-29 DIAGNOSIS — I10 Essential (primary) hypertension: Secondary | ICD-10-CM

## 2022-11-29 DIAGNOSIS — I4891 Unspecified atrial fibrillation: Secondary | ICD-10-CM

## 2022-11-29 MED ORDER — FUROSEMIDE 40 MG PO TABS: ORAL_TABLET | ORAL | 3 refills | Status: DC

## 2022-11-29 NOTE — Telephone Encounter (Signed)
Pt to have labs 12/19/22.

## 2022-11-29 NOTE — Telephone Encounter (Signed)
-----   Message from Dorris Carnes V, MD sent at 11/28/2022  9:33 PM EST ----- Cr is a lttle better     I would recomm stopping 40 mg dose lasix in pm    Follow up BNP and BMET in 2 to 3 wks    Follow LE swelling

## 2022-11-29 NOTE — Telephone Encounter (Signed)
Changes from Lab results made... sent the pt a My Chart message and will follow up with her to make lab appt.

## 2022-11-29 NOTE — Addendum Note (Signed)
Addended by: Stephani Police on: 11/29/2022 03:28 PM   Modules accepted: Orders

## 2022-11-30 ENCOUNTER — Encounter (HOSPITAL_BASED_OUTPATIENT_CLINIC_OR_DEPARTMENT_OTHER): Payer: Medicare Other | Attending: Internal Medicine | Admitting: Internal Medicine

## 2022-11-30 DIAGNOSIS — I872 Venous insufficiency (chronic) (peripheral): Secondary | ICD-10-CM | POA: Insufficient documentation

## 2022-11-30 DIAGNOSIS — L03116 Cellulitis of left lower limb: Secondary | ICD-10-CM | POA: Insufficient documentation

## 2022-11-30 DIAGNOSIS — I4891 Unspecified atrial fibrillation: Secondary | ICD-10-CM | POA: Diagnosis not present

## 2022-11-30 DIAGNOSIS — L97822 Non-pressure chronic ulcer of other part of left lower leg with fat layer exposed: Secondary | ICD-10-CM | POA: Diagnosis not present

## 2022-11-30 DIAGNOSIS — L03115 Cellulitis of right lower limb: Secondary | ICD-10-CM | POA: Diagnosis not present

## 2022-11-30 DIAGNOSIS — E11622 Type 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: Secondary | ICD-10-CM

## 2022-11-30 DIAGNOSIS — Z833 Family history of diabetes mellitus: Secondary | ICD-10-CM | POA: Insufficient documentation

## 2022-11-30 DIAGNOSIS — M1990 Unspecified osteoarthritis, unspecified site: Secondary | ICD-10-CM | POA: Diagnosis not present

## 2022-11-30 DIAGNOSIS — I87333 Chronic venous hypertension (idiopathic) with ulcer and inflammation of bilateral lower extremity: Secondary | ICD-10-CM | POA: Diagnosis not present

## 2022-11-30 DIAGNOSIS — I890 Lymphedema, not elsewhere classified: Secondary | ICD-10-CM | POA: Diagnosis not present

## 2022-11-30 DIAGNOSIS — L97812 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d: Secondary | ICD-10-CM

## 2022-11-30 DIAGNOSIS — Z794 Long term (current) use of insulin: Secondary | ICD-10-CM | POA: Insufficient documentation

## 2022-11-30 DIAGNOSIS — E1151 Type 2 diabetes mellitus with diabetic peripheral angiopathy without gangrene: Secondary | ICD-10-CM | POA: Insufficient documentation

## 2022-11-30 DIAGNOSIS — M109 Gout, unspecified: Secondary | ICD-10-CM | POA: Diagnosis not present

## 2022-11-30 NOTE — Progress Notes (Signed)
CAROLY, PUREWAL ([id number]) [id number] Page 1 of 9 Visit Report for 11/30/2022 Chief Complaint Document Details Patient Name: Date of Service: [name], [name] [name] 11/30/2022 9:30 A M Medical Record Number: [id number] Patient Account Number: [id number] Date of Birth/Sex: Treating RN: [birth year] (72 y.o. F) Primary Care Provider: [name], [name] Other Clinician: Referring Provider: Treating Provider/Extender: [name] [name], [name] Weeks in Treatment: 19 Information Obtained from: Patient Chief Complaint 07/19/2022; bilateral lower extremity wounds 11/30/2022; right lower extremity wound Electronic Signature(s) Signed: 11/30/2022 11:44:41 AM By: [name] DO Entered By: [name] on 11/30/2022 10:10:08 -------------------------------------------------------------------------------- HPI Details Patient Name: Date of Service: [name] [place], [place] RIA N 11/30/2022 9:30 A M Medical Record Number: [id number] Patient Account Number: [id number] Date of Birth/Sex: Treating RN: 01/12/1950 (72 y.o. F) Primary Care Provider: [name], [name] Other Clinician: Referring Provider: Treating Provider/Extender: [name] [name], [name] Weeks in Treatment: 19 History of Present Illness HPI Description: Admission 07/19/2022 [name] Is a 72 year old female with a past medical history of endometrial cancer, lymphedema, insulin-dependent type 2 diabetes that presents to the clinic for a 4-week history of weeping to her lower extremities bilaterally. She visited the ED for this issue on 07/07/2022 and she was prescribed doxycycline For bilateral lower extremity cellulitis. She has completed this course. She currently denies signs of infection. She does not have compression stockings. She states she recently gained weight over the past couple months. She has never had wounds before. 7/28; patient presents for follow-up. We have  been using silver alginate under Kerlix/Coban. She is scheduled to get her ABIs with TBI's on 8/7. She states that the wraps slid down to her mid shin. She denies signs of infection. 8/7; patient presents for follow-up. We have been using silver alginate under Kerlix/Coban. She had arterial studies done that showed an ABI of 0.86 on the right and 0.82 on the left with multiphasic waveforms throughout the lower extremities. 8/14; patient presents for follow-up. We have been using silver alginate under 3 layer compression. She tolerated the wrap well. She has no issues or complaints today. 8/21; patient presents for follow-up. We have been using silver alginate under 3 layer compression. She has no issues or complaints today. She has her juxta lite compressions with her. 8/28; patient presents for follow-up. We have been using silver alginate under 3 layer compression. She has no issues or complaints today. 9/5; patient presents for follow-up. We have been using silver alginate under 4 layer compression to both extremities bilaterally. She has her juxta lite compression with her today. 9/12; patient presents for follow-up. We have been using silver alginate under 4-layer compression to the right leg. She was supposed to wear her juxta lite compression to the left lower extremity. She states she could not get it on so she has not been wearing it. She also has not been taking her Lasix. 9/19; patient presents for follow-up. We have been using silver alginate under 4-layer compression to her lower extremities bilaterally. She has restarted taking her Lasix on a daily basis. 9/26; patient presents for follow-up. We have been using silver alginate under 4-layer compression to the lower extremities bilaterally. Her left lower extremity [name], [name] ([id number]) [id number] Page 2 of 9 wounds have healed. She has juxta lite compressions. 10/12; patient presents for follow-up.  Patient had COVID last week and missed her appointment. She states she took her compression wrap off on the right lower  extremity. Her wounds were healed on the left lower extremity at last clinic visit but these have since reopened. She has juxta lite compression and states she has been wearing them. Unfortunately she has multiple scattered wounds now to her right lower extremity as well. No signs of infection on exam. We have offered to order her lymphedema pumps however she states she wants to use her husband's. She has not been doing this. Her diuretic is 60 mg of Lasix twice weekly. She has not been contacted for her venous reflux studies. 10/19; patient presents for follow-up. She is now on 80 mg of Lasix twice daily. She reports significant improvement in her leg swelling. Her wounds have improved greatly. We have been using silver alginate under 4-layer compression. The left leg is healed. She does not have her Velcro compression wrap with her. She has 2 open wounds to the right lower extremity limited to skin breakdown with minimal weeping. She denies signs of infection. 10/26; patient presents for follow-up. The left lower extremity wounds Remain closed. Her right lower extremity still has some scattered open wounds limited to skin breakdown. The wrap slid down slightly to the right lower extremity. She is now taking 80 mg of Lasix in the morning and '40mg'$  in the afternoon. 11/2; the patient was healed out on the left last visit. She did not have a JuxtaLite which she did have was a stocking from [employer]. She used this however predictably the leg is broken down in the left anterior and 2 small areas in the left calf. The right leg really does not look too bad. 11/16; patient presents for follow-up. Her wounds have healed. She has her compression garments with her. 11/30/2022 Patient has reopened wounds to the right lower extremity. There is been a decrease in the Lasix recently and patient has  not been using her lymphedema pump since she was discharged from the clinic 2 weeks ago. She has been using her juxta lite compression style lower extremities bilaterally. Her left leg has no open wounds. Electronic Signature(s) Signed: 11/30/2022 11:44:41 AM By: [name] DO Entered By: [name] on 11/30/2022 10:10:52 -------------------------------------------------------------------------------- Physical Exam Details Patient Name: Date of Service: [name], [name] RIA N 11/30/2022 9:30 A M Medical Record Number: [id number] Patient Account Number: [id number] Date of Birth/Sex: Treating RN: April 04, 1950 (72 y.o. F) Primary Care Provider: [name], [name] Other Clinician: Referring Provider: Treating Provider/Extender: [name] [name], [name] Weeks in Treatment: 19 Constitutional respirations regular, non-labored and within target range for patient.. Cardiovascular 2+ dorsalis pedis/posterior tibialis pulses. Psychiatric pleasant and cooperative. Notes Right lower extremity: Small scattered open wounds to the lower extremity to the anterior and posterior aspect. 2+ pitting edema to the knee. Left lower extremity with no open wounds. Electronic Signature(s) Signed: 11/30/2022 11:44:41 AM By: [name] DO Entered By: [name] on 11/30/2022 10:15:01 -------------------------------------------------------------------------------- Physician Orders Details Patient Name: Date of Service: [place], [name] RIA N 11/30/2022 9:30 A M Medical Record Number: [id number] Patient Account Number: [id number] Date of Birth/Sex: Treating RN: 1950-02-11 (72 y.o. [name] Primary Care Provider: [facility] A [place]i [place], [name] Other Clinician: Referring Provider: Treating Provider/Extender: [name] [facility] [address], [place] Tona Sensing, Jinny Sanders ([id number]) [id number] Page 3 of 9 Weeks in Treatment: 63 Verbal / Phone Orders:  No Diagnosis Coding Follow-up Appointments ppointment in 1 week. - Dr. Heber [place] Return A ppointment in 2 weeks. - Dr. Heber [place] Return A Other: - bring in compression  stockings next week. Bathing/ Shower/ Hygiene May shower with protection but do not get wound dressing(s) wet. Edema Control - Lymphedema / SCD / Other Lymphedema Pumps. Use Lymphedema pumps on leg(s) 2-3 times a day for 45-60 minutes. If wearing any wraps or hose, do not remove them. Continue exercising as instructed. - 2-3 times a day throughout the day. Elevate legs to the level of the heart or above for 30 minutes daily and/or when sitting, a frequency of: - 3-4 times a day throughout the day. Avoid standing for long periods of time. Patient to wear own compression stockings every day. - on left leg- apply in the morning and remove at night. Exercise regularly Compression stocking or Garment 30-40 mm/Hg pressure to: - left leg. Wound Treatment Wound #3 - Lower Leg Wound Laterality: Right, Posterior Cleanser: Soap and Water 1 x Per Week/30 Days Discharge Instructions: May shower and wash wound with dial antibacterial soap and water prior to dressing change. Cleanser: Wound Cleanser 1 x Per Week/30 Days Discharge Instructions: Cleanse the wound with wound cleanser prior to applying a clean dressing using gauze sponges, not tissue or cotton balls. Peri-Wound Care: Sween Lotion (Moisturizing lotion) 1 x Per Week/30 Days Discharge Instructions: Apply moisturizing lotion as directed Topical: Gentamicin 1 x Per Week/30 Days Discharge Instructions: As directed by physician Topical: Mupirocin Ointment 1 x Per Week/30 Days Discharge Instructions: Apply Mupirocin (Bactroban) as instructed Prim Dressing: Sorbalgon AG Dressing, 4x4 (in/in) 1 x Per Week/30 Days ary Discharge Instructions: Apply to wound bed as instructed Secondary Dressing: ABD Pad, 8x10 1 x Per Week/30 Days Discharge Instructions: Apply over primary dressing as  directed. Secondary Dressing: Zetuvit Plus 4x8 in 1 x Per Week/30 Days Discharge Instructions: Apply over primary dressing as directed. Compression Wrap: FourPress (4 layer compression wrap) 1 x Per Week/30 Days Discharge Instructions: Apply four layer compression as directed. apply first layer unna boot to upper portion of lower leg. Compression Stockings: Circaid Juxta Lite Compression Wrap (DME) Right Leg Compression Amount: 30-40 mmHG Discharge Instructions: Apply Circaid Juxta Lite Compression Wrap daily as instructed. Apply first thing in the morning, remove at night before bed. Electronic Signature(s) Signed: 11/30/2022 11:44:41 AM By: [name] DO Signed: 11/30/2022 4:33:25 PM By: [name] RN, BSN Entered By: [name] on 11/30/2022 10:32:23 -------------------------------------------------------------------------------- Problem List Details Patient Name: Date of Service: [name], [name] RIA N 11/30/2022 9:30 A M Medical Record Number: [id number] Patient Account Number: [id number] Date of Birth/Sex: Treating RN: 10-26-1950 (72 y.o. F) Primary Care Provider: [name], [name] Other Clinician: Referring Provider: Treating Provider/Extender: [name] [facility] [address] [place] Tona Sensing, Jinny Sanders ([id number]) [id number] Page 4 of 9 Weeks in Treatment: 19 Active Problems ICD-10 Encounter Code Description Active Date MDM Diagnosis L97.812 Non-pressure chronic ulcer of other part of right lower leg with fat layer 07/19/2022 No Yes exposed L97.822 Non-pressure chronic ulcer of other part of left lower leg with fat layer exposed7/20/2023 No Yes I89.0 Lymphedema, not elsewhere classified 07/19/2022 No Yes I87.333 Chronic venous hypertension (idiopathic) with ulcer and inflammation of 07/19/2022 No Yes bilateral lower extremity E11.622 Type 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 07/19/2022 No Yes 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  07/19/2022 No Yes Inactive Problems Resolved Problems Electronic Signature(s) Signed: 11/30/2022 11:44:41 AM By: [name] DO Entered By: [name] on 11/30/2022 10:01:34 -------------------------------------------------------------------------------- Progress Note Details Patient Name: Date of Service: [name], [place] N 11/30/2022 9:30 A M Medical Record Number: [id number] Patient Account Number: [id number] Date  of Birth/Sex: Treating RN: 08-20-1950 (72 y.o. F) Primary Care Provider: [name], [name] Other Clinician: Referring Provider: Treating Provider/Extender: [name] [name], [name] Weeks in Treatment: 19 Subjective Chief Complaint Information obtained from Patient 07/19/2022; bilateral lower extremity wounds 11/30/2022; right lower extremity wound History of Present Illness (HPI) Admission 07/19/2022 [name] Is a 72 year old female with a past medical history of endometrial cancer, lymphedema, insulin-dependent type 2 diabetes that presents to the clinic for a 4-week history of weeping to her lower extremities bilaterally. She visited the ED for this issue on 07/07/2022 and she was prescribed doxycycline For bilateral lower extremity cellulitis. She has completed this course. She currently denies signs of infection. She does not have compression stockings. She states she recently gained weight over the past couple months. She has never had wounds before. 7/28; patient presents for follow-up. We have been using silver alginate under Kerlix/Coban. She is scheduled to get her ABIs with TBI's on 8/7. She states that the wraps slid down to her mid shin. She denies signs of infection. [name], [name] ([id number]) [id number] Page 5 of 9 8/7; patient presents for follow-up. We have been using silver alginate under Kerlix/Coban. She had arterial studies done that showed an ABI of 0.86 on the right and 0.82 on the left with  multiphasic waveforms throughout the lower extremities. 8/14; patient presents for follow-up. We have been using silver alginate under 3 layer compression. She tolerated the wrap well. She has no issues or complaints today. 8/21; patient presents for follow-up. We have been using silver alginate under 3 layer compression. She has no issues or complaints today. She has her juxta lite compressions with her. 8/28; patient presents for follow-up. We have been using silver alginate under 3 layer compression. She has no issues or complaints today. 9/5; patient presents for follow-up. We have been using silver alginate under 4 layer compression to both extremities bilaterally. She has her juxta lite compression with her today. 9/12; patient presents for follow-up. We have been using silver alginate under 4-layer compression to the right leg. She was supposed to wear her juxta lite compression to the left lower extremity. She states she could not get it on so she has not been wearing it. She also has not been taking her Lasix. 9/19; patient presents for follow-up. We have been using silver alginate under 4-layer compression to her lower extremities bilaterally. She has restarted taking her Lasix on a daily basis. 9/26; patient presents for follow-up. We have been using silver alginate under 4-layer compression to the lower extremities bilaterally. Her left lower extremity wounds have healed. She has juxta lite compressions. 10/12; patient presents for follow-up. Patient had COVID last week and missed her appointment. She states she took her compression wrap off on the right lower extremity. Her wounds were healed on the left lower extremity at last clinic visit but these have since reopened. She has juxta lite compression and states she has been wearing them. Unfortunately she has multiple scattered wounds now to her right lower extremity as well. No signs of infection on exam. We have offered to order her  lymphedema pumps however she states she wants to use her husband's. She has not been doing this. Her diuretic is 60 mg of Lasix twice weekly. She has not been contacted for her venous reflux studies. 10/19; patient presents for follow-up. She is now on 80 mg of Lasix twice daily. She reports significant improvement in her leg  swelling. Her wounds have improved greatly. We have been using silver alginate under 4-layer compression. The left leg is healed. She does not have her Velcro compression wrap with her. She has 2 open wounds to the right lower extremity limited to skin breakdown with minimal weeping. She denies signs of infection. 10/26; patient presents for follow-up. The left lower extremity wounds Remain closed. Her right lower extremity still has some scattered open wounds limited to skin breakdown. The wrap slid down slightly to the right lower extremity. She is now taking 80 mg of Lasix in the morning and '40mg'$  in the afternoon. 11/2; the patient was healed out on the left last visit. She did not have a JuxtaLite which she did have was a stocking from [employer]. She used this however predictably the leg is broken down in the left anterior and 2 small areas in the left calf. The right leg really does not look too bad. 11/16; patient presents for follow-up. Her wounds have healed. She has her compression garments with her. 11/30/2022 Patient has reopened wounds to the right lower extremity. There is been a decrease in the Lasix recently and patient has not been using her lymphedema pump since she was discharged from the clinic 2 weeks ago. She has been using her juxta lite compression style lower extremities bilaterally. Her left leg has no open wounds. Patient History Information obtained from Patient, Chart. Family History Cancer - Father, Diabetes - Siblings. Social History Former smoker - quit 1988, Marital Status - Married, Alcohol Use - Moderate - cocktails x2 a week, Drug Use - No  History, Caffeine Use - Never. Medical History Hematologic/Lymphatic Patient has history of Anemia - iron Cardiovascular Patient has history of Arrhythmia - A. Fib 09/10/2022 dx started eliquis, Hypertension Endocrine Patient has history of Type II Diabetes Musculoskeletal Patient has history of Gout, Osteoarthritis Oncologic Patient has history of Received Radiation - 2023 Hospitalization/Surgery History - one ovary removed 2020. - pilonidal cyst extraction 1969. - diverculitis temp colostomy. Medical A Surgical History Notes nd Cardiovascular hyperlipidemia Gastrointestinal GERD Diverticulitis Musculoskeletal carpel tunnel bilateral-surgery to right hand Oncologic endometrial cancer skin Ca Objective LORRAIN, [name] ([id number]) [id number] Page 6 of 9 Constitutional respirations regular, non-labored and within target range for patient.. Vitals Time Taken: 9:37 AM, Height: 64 in, Weight: 325 lbs, BMI: 55.8, Temperature: 98.7 F, Pulse: 79 bpm, Respiratory Rate: 17 breaths/min, Blood Pressure: 119/77 mmHg. Cardiovascular 2+ dorsalis pedis/posterior tibialis pulses. Psychiatric pleasant and cooperative. General Notes: Right lower extremity: Small scattered open wounds to the lower extremity to the anterior and posterior aspect. 2+ pitting edema to the knee. Left lower extremity with no open wounds. Integumentary (Hair, Skin) Wound #3 status is Open. Original cause of wound was Gradually Appeared. The date acquired was: 11/21/2022. The wound is located on the Right,Posterior Lower Leg. The wound measures 2.5cm length x 4cm width x 0.1cm depth; 7.854cm^2 area and 0.785cm^3 volume. There is no tunneling or undermining noted. There is a medium amount of serosanguineous drainage noted. The wound margin is distinct with the outline attached to the wound base. There is large (67- 100%) red, pink granulation within the wound bed. There is a small (1-33%) amount  of necrotic tissue within the wound bed including Eschar and Adherent Slough. The periwound skin appearance did not exhibit: Callus, Crepitus, Excoriation, Induration, Rash, Scarring, Dry/Scaly, Maceration, Atrophie Blanche, Cyanosis, Ecchymosis, Hemosiderin Staining, Mottled, Pallor, Rubor, Erythema. Periwound temperature was noted as No Abnormality. The periwound has tenderness on palpation. Assessment Active Problems  ICD-10 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d Non-pressure chronic ulcer of other part of left lower leg with fat layer exposed Lymphedema, not elsewhere classified Chronic venous hypertension (idiopathic) with ulcer and inflammation of bilateral lower extremity Type 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 Malignant neoplasm of endometrium Patient has developed a new wounds to the right lower extremity. She has lymphedema and venous insufficiency. She has not been using the lymphedema pumps. She has been using her juxta light compression daily to the lower extremities bilaterally. She has no open wounds to the left lower extremity today. There is also been a decrease in her Lasix recently. At this time I recommended silver alginate with antibiotic ointment under 4-layer compression to the right lower extremity. Juxta light compression to the left lower extremity. Her juxta lite garments appear to be too large and we will order her new ones. Daily lymphedema pumps. Follow-up in 1 week. Procedures Wound #3 Pre-procedure diagnosis of Wound #3 is a Venous Leg Ulcer located on the Right,Posterior Lower Leg . There was a Four Layer Compression Therapy Procedure by [name], RN. Post procedure Diagnosis Wound #3: Same as Pre-Procedure Plan Follow-up Appointments: Return Appointment in 1 week. - Dr. Heber [place] Return Appointment in 2 weeks. - Dr. Heber [place] Other: - bring in compression stockings next week. Bathing/ Shower/  Hygiene: May shower with protection but do not get wound dressing(s) wet. Edema Control - Lymphedema / SCD / Other: Lymphedema Pumps. Use Lymphedema pumps on leg(s) 2-3 times a day for 45-60 minutes. If wearing any wraps or hose, do not remove them. Continue exercising as instructed. - 2-3 times a day throughout the day. Elevate legs to the level of the heart or above for 30 minutes daily and/or when sitting, a frequency of: - 3-4 times a day throughout the day. Avoid standing for long periods of time. Patient to wear own compression stockings every day. - on left leg- apply in the morning and remove at night. Exercise regularly Compression stocking or Garment 30-40 mm/Hg pressure to: - left leg. WOUND #3: - Lower Leg Wound Laterality: Right, Posterior Cleanser: Soap and Water 1 x Per Week/30 Days Discharge Instructions: May shower and wash wound with dial antibacterial soap and water prior to dressing change. Cleanser: Wound Cleanser 1 x Per Week/30 Days Discharge Instructions: Cleanse the wound with wound cleanser prior to applying a clean dressing using gauze sponges, not tissue or cotton balls. Peri-Wound Care: Sween Lotion (Moisturizing lotion) 1 x Per Week/30 Days Discharge Instructions: Apply moisturizing lotion as directed [name], [name] ([id number]) [id number] Page 7 of 9 Topical: Gentamicin 1 x Per Week/30 Days Discharge Instructions: As directed by physician Topical: Mupirocin Ointment 1 x Per Week/30 Days Discharge Instructions: Apply Mupirocin (Bactroban) as instructed Prim Dressing: Sorbalgon AG Dressing, 4x4 (in/in) 1 x Per Week/30 Days ary Discharge Instructions: Apply to wound bed as instructed Secondary Dressing: ABD Pad, 8x10 1 x Per Week/30 Days Discharge Instructions: Apply over primary dressing as directed. Secondary Dressing: Zetuvit Plus 4x8 in 1 x Per Week/30 Days Discharge Instructions: Apply over primary dressing as directed. Com  pression Wrap: FourPress (4 layer compression wrap) 1 x Per Week/30 Days Discharge Instructions: Apply four layer compression as directed. apply first layer unna boot to upper portion of lower leg. Com pression Stockings: Circaid Juxta Lite Compression Wrap (DME) Compression Amount: 30-40 mmHg (right) Discharge Instructions: Apply Circaid Juxta Lite Compression Wrap daily as instructed. Apply first thing in the  morning, remove at night before bed. 1. Silver alginate with antibiotic ointment under 4-layer compression to the right lower extremity 2. Juxta light compression to the left lower extremity 3. Lymphedema pumps 4. Follow-up in 1 week Electronic Signature(s) Signed: 11/30/2022 11:44:41 AM By: [name] DO Entered By: [name] on 11/30/2022 10:36:36 -------------------------------------------------------------------------------- HxROS Details Patient Name: Date of Service: [name] Leyden, [place] RIA N 11/30/2022 9:30 A M Medical Record Number: [id number] Patient Account Number: [id number] Date of Birth/Sex: Treating RN: 1950/03/25 (72 y.o. F) Primary Care Provider: [name] [name] Other Clinician: Referring Provider: Treating Provider/Extender: [name] [name] [name] Weeks in Treatment: 19 Information Obtained From Patient Chart Hematologic/Lymphatic Medical History: Positive for: Anemia - iron Cardiovascular Medical History: Positive for: Arrhythmia - A. Fib 09/10/2022 dx started eliquis; Hypertension Past Medical History Notes: hyperlipidemia Gastrointestinal Medical History: Past Medical History Notes: GERD Diverticulitis Endocrine Medical History: Positive for: Type II Diabetes Time with diabetes: 72 years old Treated with: Insulin, Diet Blood sugar tested every day: Yes Tested : three times a week. Musculoskeletal Medical History: Positive for: Gout; Osteoarthritis [name], [name] ([id number]) [id number]  Page 8 of 9 Past Medical History Notes: carpel tunnel bilateral-surgery to right hand Oncologic Medical History: Positive for: Received Radiation - 2023 Past Medical History Notes: endometrial cancer skin Ca Immunizations Pneumococcal Vaccine: Received Pneumococcal Vaccination: Yes Received Pneumococcal Vaccination On or After 60th Birthday: No Implantable Devices No devices added Hospitalization / Surgery History Type of Hospitalization/Surgery one ovary removed 2020 pilonidal cyst extraction 1969 diverculitis temp colostomy Family and Social History Cancer: Yes - Father; Diabetes: Yes - Siblings; Former smoker - quit 1988; Marital Status - Married; Alcohol Use: Moderate - cocktails x2 a week; Drug Use: No History; Caffeine Use: Never; Financial Concerns: No; Food, Clothing or Shelter Needs: No; Support System Lacking: No; Transportation Concerns: No Electronic Signature(s) Signed: 11/30/2022 11:44:41 AM By: [name] DO Entered By: [name] on 11/30/2022 10:14:28 -------------------------------------------------------------------------------- SuperBill Details Patient Name: Date of Service: [name], [place] 11/30/2022 Medical Record Number: [id number] Patient Account Number: [id number] Date of Birth/Sex: Treating RN: 1950-06-26 (72 y.o. F) Primary Care Provider: [name] [name] Other Clinician: Referring Provider: Treating Provider/Extender: [name] [name] [name] Weeks in Treatment: 19 Diagnosis Coding ICD-10 Codes Code Description [phone] 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d L97.822 Non-pressure chronic ulcer of other part of left lower leg with fat layer exposed I89.0 Lymphedema, not elsewhere classified I87.333 Chronic venous hypertension (idiopathic) with ulcer and inflammation of bilateral lower extremity E11.622 Type 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 C54.1 Malignant  neoplasm of endometrium Facility Procedures : CPT4 Code: [id number] Description: (Facility Use Only) [id number] RT LEG Modifier: Quantity: 1 Physician Procedures : CPT4 Code Description Modifier [id number] - WC PHYS LEVEL 3 - EST PT ICD-10 Diagnosis Description A83.419 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d Bronaugh, Jinny Sanders ([id number])  [id number] Page I89.0 Lymphedema, not elsewhere classified E11.622 Type 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 Quantity: 1 9 of 9 Electronic Signature(s) Signed: 11/30/2022 11:44:41 AM By: [name] DO Signed: 11/30/2022 4:33:25 PM By: [name] RN, BSN Entered By: [name] on 11/30/2022 11:15:23

## 2022-12-03 ENCOUNTER — Ambulatory Visit (HOSPITAL_COMMUNITY)
Admission: RE | Admit: 2022-12-03 | Discharge: 2022-12-03 | Disposition: A | Discharge status: Home / Self Care | Payer: Medicare Other | Source: Ambulatory Visit | Attending: Gynecologic Oncology | Admitting: Gynecologic Oncology

## 2022-12-03 DIAGNOSIS — C541 Malignant neoplasm of endometrium: Secondary | ICD-10-CM | POA: Diagnosis not present

## 2022-12-03 DIAGNOSIS — Z8542 Personal history of malignant neoplasm of other parts of uterus: Secondary | ICD-10-CM | POA: Diagnosis not present

## 2022-12-03 MED ORDER — GADOBUTROL 1 MMOL/ML IV SOLN
10.0000 mL | Freq: Once | INTRAVENOUS | Status: AC | PRN
  Administered 2022-12-03: 10 mL via INTRAVENOUS

## 2022-12-03 NOTE — Progress Notes (Signed)
CATALEAH, STITES ([id number]) [id number] Page 1 of 7 Visit Report for 11/30/2022 Arrival Information Details Patient Name: Date of Service: [name], [name] 11/30/2022 9:30 A M Medical Record Number: [id number] Patient Account Number: [id number] Date of Birth/Sex: Treating RN: 05/20/1950 (72 y.o. Tonita Phoenix, Lauren Primary Care Ching Rabideau: [name] [name] Other Clinician: Referring Jhade Berko: Treating Mabrey Howland/Extender: [name] [name] [name] Weeks in Treatment: 68 Visit Information History Since Last Visit Added or deleted any medications: No Patient Arrived: Wheel Chair Any new allergies or adverse reactions: No Arrival Time: 09:34 Had a fall or experienced change in No Accompanied By: self activities of daily living that may affect Transfer Assistance: Manual risk of falls: Patient Identification Verified: Yes Signs or symptoms of abuse/neglect since last visito No Secondary Verification Process Completed: Yes Hospitalized since last visit: No Patient Requires Transmission-Based No Implantable device outside of the clinic excluding No Precautions: cellular tissue based products placed in the center Patient Has Alerts: Yes since last visit: Patient Alerts: Patient on Blood Thinner Has Dressing in Place as Prescribed: Yes 08/06/2022 TBI L 0.59 R0.65 Pain Present Now: No 08/06/2022 ABI R 0.86 L0.82 Electronic Signature(s) Signed: 12/03/2022 3:46:46 PM By: Rhae Hammock RN Entered By: Rhae Hammock on 11/30/2022 09:37:55 -------------------------------------------------------------------------------- Compression Therapy Details Patient Name: Date of Service: [name] 11/30/2022 9:30 A M Medical Record Number: [id number] Patient Account Number: [id number] Date of Birth/Sex: Treating RN: February 16, 1950 (72 y.o. [name] Primary Care Chadwin Fury: [name] [name] Other Clinician: Referring Ronte Parker: Treating  Refugio Mcconico/Extender: [name] [name] [name] Weeks in Treatment: 19 Compression Therapy Performed for Wound Assessment: Wound #3 Right,Posterior Lower Leg Performed By: Clinician [name], RN Compression Type: Four Layer Post Procedure Diagnosis Same as Pre-procedure Electronic Signature(s) Signed: 11/30/2022 4:33:25 PM By: [name] RN, BSN Entered By: [name] on 11/30/2022 09:57:51 [name] ([id number]) [id number] Page 2 of 7 -------------------------------------------------------------------------------- Encounter Discharge Information Details Patient Name: Date of Service: [name], [name] 11/30/2022 9:30 A M Medical Record Number: [id number] Patient Account Number: [id number] Date of Birth/Sex: Treating RN: Aug 26, 1950 (72 y.o. [name] Primary Care Lior Cartelli: [name] [name] Other Clinician: Referring Dream Harman: Treating Veretta Sabourin/Extender: [name] [name] [name] Weeks in Treatment: 19 Encounter Discharge Information Items Discharge Condition: Stable Ambulatory Status: Wheelchair Discharge Destination: Home Transportation: Private Auto Accompanied By: self Schedule Follow-up Appointment: Yes Clinical Summary of Care: Electronic Signature(s) Signed: 11/30/2022 4:33:25 PM By: [name] RN, BSN Entered By: [name] on 11/30/2022 11:15:44 -------------------------------------------------------------------------------- Lower Extremity Assessment Details Patient Name: Date of Service: [name], [name] 11/30/2022 9:30 A M Medical Record Number: [id number] Patient Account Number: [id number] Date of Birth/Sex: Treating RN: 1950/10/04 (72 y.o. Tonita Phoenix, Lauren Primary Care Khaya Theissen: [name] [name] Other Clinician: Referring Shaterrica Territo: Treating Billyjack Trompeter/Extender: [name] [name] [name] Weeks in Treatment: 19 Edema Assessment Assessed: [Left: Yes]  [Right: Yes] Edema: [Left: Yes] [Right: Yes] Calf Left: Right: Point of Measurement: 39 cm From Medial Instep 43.7 cm 42 cm Ankle Left: Right: Point of Measurement: 10 cm From Medial Instep 27 cm 26.3 cm Knee To Floor Left: Right: From Medial Instep 41 cm 41 cm Vascular Assessment Pulses: Dorsalis Pedis Palpable: [Left:Yes] [Right:Yes] Posterior Tibial Palpable: [Left:Yes] [Right:Yes] Electronic Signature(s) Signed: 11/30/2022 4:33:25 PM By: [name] RN, BSN Signed: 12/03/2022 3:46:46 PM By: Rhae Hammock RN Entered By: [name]  on 11/30/2022 10:00:11 [name], [name] ([id number]) [id number] Page 3 of 7 -------------------------------------------------------------------------------- Multi Wound Chart Details Patient Name: Date of Service: [name], [name] 11/30/2022 9:30 A M Medical Record Number: [id number] Patient Account Number: [id number] Date of Birth/Sex: Treating RN: 03-20-1950 (72 y.o. F) Primary Care Loranzo Desha: [name] [name] Other Clinician: Referring Jenesa Foresta: Treating Tylin Force/Extender: [name] [name] [name] Weeks in Treatment: 19 Vital Signs Height(in): 64 Pulse(bpm): 11 Weight(lbs): 325 Blood Pressure(mmHg): 119/77 Body Mass Index(BMI): 55.8 Temperature(F): 98.7 Respiratory Rate(breaths/min): 17 [3:Photos:] [[name]:[name]] Right, Posterior Lower Leg [name] [name] Wound Location: Gradually Appeared [name] [name] Wounding Event: Venous Leg Ulcer [name] [name] Primary Etiology: Anemia, Arrhythmia, Hypertension, [name] [name] Comorbid History: Type II Diabetes, Gout, Osteoarthritis, Received Radiation 11/21/2022 [name] [name] Date Acquired: 0 [name] [name] Weeks of Treatment: Open [name] [name] Wound Status: No [name] [name] Wound Recurrence: Yes [name] [name] Clustered Wound: 3 [name] [name] Clustered Quantity: 2.5x4x0.1 [name] [name] Measurements L x W x D (cm) 7.854 [name] [name] A (cm) : rea 0.785 [name] [name] Volume (cm) : Full Thickness Without  Exposed [name] [name] Classification: Support Structures Medium [name] [name] Exudate A mount: Serosanguineous [name] [name] Exudate Type: red, brown [name] [name] Exudate Color: Distinct, outline attached [name] [name] Wound Margin: Large (67-100%) [name] [name] Granulation Amount: Red, Pink [name] [name] Granulation Quality: Small (1-33%) [name] [name] Necrotic Amount: Eschar, Adherent Slough [name] [name] Necrotic Tissue: Fascia: No [name] [name] Exposed Structures: Fat Layer (Subcutaneous Tissue): No Tendon: No Muscle: No Joint: No Bone: No Small (1-33%) [name] [name] Epithelialization: Excoriation: No [name] [name] Periwound Skin Texture: Induration: No Callus: No Crepitus: No Rash: No Scarring: No Maceration: No [name] [name] Periwound Skin Moisture: Dry/Scaly: No Atrophie Blanche: No [name] [name] Periwound Skin Color: Cyanosis: No Ecchymosis: No Erythema: No Hemosiderin Staining: No Mottled: No Pallor: No Rubor: No Sennett, Jinny Sanders ([id number]) [id number] Page 4 of 7 No Abnormality [name] [name] Temperature: Yes [name] [name] Tenderness on Palpation: Compression Therapy [name] [name] Procedures Performed: Treatment Notes Electronic Signature(s) Signed: 11/30/2022 11:44:41 AM By: [name] DO Entered By: [name] on 11/30/2022 10:01:39 -------------------------------------------------------------------------------- [place] Details Patient Name: Date of Service: [name], [name] 11/30/2022 9:30 A M Medical Record Number: [id number] Patient Account Number: [id number] Date of Birth/Sex: Treating RN: [birth year] (72 y.o. [name] Primary Care Mithcell Schumpert: [name] [name] Other Clinician: Referring Manreet Kiernan: Treating Elvert Cumpton/Extender: [name] [name] [name] Weeks in Treatment: 24 Active Inactive Abuse / Safety / Falls / Self Care Management Nursing Diagnoses: Potential for injury related to falls Goals: Patient will not experience any injury related to  falls Date Initiated: 11/30/2022 Target Resolution Date: 02/22/2023 Goal Status: Active Interventions: Provide education on fall prevention Notes: Electronic Signature(s) Signed: 11/30/2022 4:33:25 PM By: [name] RN, BSN Entered By: [name] on 11/30/2022 10:16:46 -------------------------------------------------------------------------------- Pain Assessment Details Patient Name: Date of Service: [name], [name] 11/30/2022 9:30 A M Medical Record Number: [id number] Patient Account Number: [id number] Date of Birth/Sex: Treating RN: 05/03/1950 (72 y.o. [name] Primary Care Mackayla Mullins: [name] [name] Other Clinician: Referring Kennedey Digilio: Treating Halia Franey/Extender: [name] [name] [name] Weeks in Treatment: 19 Active Problems Location of Pain Severity and Description of Pain Patient Has Paino No Site Locations [place], [place] ([id number]) [id number] Page 5 of 7 Pain Management and Medication Current Pain Management: Electronic Signature(s) Signed: 12/03/2022 3:46:46 PM By: Rhae Hammock RN Entered By: Rhae Hammock on 11/30/2022 09:38:37 --------------------------------------------------------------------------------  Patient/Caregiver Education Details Patient Name: Date of Service: [place], [name] 12/1/2023andnbsp9:30 A M Medical Record Number: [id number] Patient Account Number: [id number] Date of Birth/Gender: Treating RN: 26-Feb-1950 (72 y.o. [name] Primary Care Physician: [name] [name] Other Clinician: Referring Physician: Treating Physician/Extender: [name] [name] [name] Weeks in Treatment: 39 Education Assessment Education Provided To: Patient Education Topics Provided Wound/Skin Impairment: Handouts: Skin Care Do's and Dont's Methods: Explain/Verbal Responses: Reinforcements needed Electronic Signature(s) Signed: 11/30/2022 4:33:25 PM By: [name] RN, BSN Entered By: [name] on 11/30/2022 10:16:56 -------------------------------------------------------------------------------- Wound Assessment Details Patient Name: Date of Service: [name], [name] 11/30/2022 9:30 A M Medical Record Number: [id number] Patient Account Number: [id number] Date of Birth/Sex: Treating RN: October 16, 1950 (72 y.o. [name] Primary Care Zeke Aker: [facility] [place], [name] Other ClinicianKEAGAN, [name] ([id number]) [id number] Page 6 of 7 Referring Zeb Rawl: Treating Chukwuma Straus/Extender: [name] [name] [name] Weeks in Treatment: 19 Wound Status Wound Number: 3 Primary Venous Leg Ulcer Etiology: Wound Location: Right, Posterior Lower Leg Wound Open Wounding Event: Gradually Appeared Status: Date Acquired: 11/21/2022 Comorbid Anemia, Arrhythmia, Hypertension, Type II Diabetes, Gout, Weeks Of Treatment: 0 History: Osteoarthritis, Received Radiation Clustered Wound: Yes Photos Wound Measurements Length: (cm) Width: (cm) Depth: (cm) Clustered Quantity: Area: (cm) Volume: (cm) 2.5 % Reduction in Area: 4 % Reduction in Volume: 0.1 Epithelialization: Small (1-33%) 3 Tunneling: No 7.854 Undermining: No 0.785 Wound Description Classification: Full Thickness Without Exposed Sup Wound Margin: Distinct, outline attached Exudate Amount: Medium Exudate Type: Serosanguineous Exudate Color: red, brown port Structures Foul Odor After Cleansing: No Slough/Fibrino Yes Wound Bed Granulation Amount: Large (67-100%) Exposed Structure Granulation Quality: Red, Pink Fascia Exposed: No Necrotic Amount: Small (1-33%) Fat Layer (Subcutaneous Tissue) Exposed: No Necrotic Quality: Eschar, Adherent Slough Tendon Exposed: No Muscle Exposed: No Joint Exposed: No Bone Exposed: No Periwound Skin Texture Texture Color No Abnormalities Noted: No No Abnormalities Noted: No Callus: No Atrophie  Blanche: No Crepitus: No Cyanosis: No Excoriation: No Ecchymosis: No Induration: No Erythema: No Rash: No Hemosiderin Staining: No Scarring: No Mottled: No Pallor: No Moisture Rubor: No No Abnormalities Noted: No Dry / Scaly: No Temperature / Pain Maceration: No Temperature: No Abnormality Tenderness on Palpation: Yes Treatment Notes Wound #3 (Lower Leg) Wound Laterality: Right, Posterior Cleanser Soap and Water Discharge Instruction: May shower and wash wound with dial antibacterial soap and water prior to dressing change. Wound Cleanser Discharge Instruction: Cleanse the wound with wound cleanser prior to applying a clean dressing using gauze sponges, not tissue or cotton balls. [name], [name] ([id number]) [id number] Page 7 of 7 Peri-Wound Care Sween Lotion (Moisturizing lotion) Discharge Instruction: Apply moisturizing lotion as directed Topical Gentamicin Discharge Instruction: As directed by physician Mupirocin Ointment Discharge Instruction: Apply Mupirocin (Bactroban) as instructed Primary Dressing Sorbalgon AG Dressing, 4x4 (in/in) Discharge Instruction: Apply to wound bed as instructed Secondary Dressing ABD Pad, 8x10 Discharge Instruction: Apply over primary dressing as directed. Zetuvit Plus 4x8 in Discharge Instruction: Apply over primary dressing as directed. Secured With Compression Wrap FourPress (4 layer compression wrap) Discharge Instruction: Apply four layer compression as directed. apply first layer unna boot to upper portion of lower leg. Compression Stockings Circaid Juxta Lite Compression Wrap Quantity: 1 Right Leg Compression Amount: 30-40 mmHg Discharge Instruction: Apply Circaid Juxta Lite Compression Wrap daily as instructed. Apply first thing in the morning, remove at night before bed. Add-Ons Electronic Signature(s) Signed: 12/03/2022 3:46:46 PM By: Hollie Salk,  Lauren RN Entered By: Rhae Hammock on  11/30/2022 09:53:01 -------------------------------------------------------------------------------- Vitals Details Patient Name: Date of Service: [place], [name] 11/30/2022 9:30 A M Medical Record Number: [id number] Patient Account Number: [id number] Date of Birth/Sex: Treating RN: [birth year] (72 y.o. Tonita Phoenix, Lauren Primary Care Naresh Althaus: [name] [name] Other Clinician: Referring Maximus Hoffert: Treating Marra Fraga/Extender: [name] [name] [name] Weeks in Treatment: 19 Vital Signs Time Taken: 09:37 Temperature (F): 98.7 Height (in): 64 Pulse (bpm): 79 Weight (lbs): 325 Respiratory Rate (breaths/min): 17 Body Mass Index (BMI): 55.8 Blood Pressure (mmHg): 119/77 Reference Range: 80 - 120 mg / dl Electronic Signature(s) Signed: 12/03/2022 3:46:46 PM By: Rhae Hammock RN Entered By: Rhae Hammock on 11/30/2022 09:38:32

## 2022-12-07 ENCOUNTER — Encounter (HOSPITAL_BASED_OUTPATIENT_CLINIC_OR_DEPARTMENT_OTHER): Payer: Medicare Other | Admitting: Internal Medicine

## 2022-12-07 DIAGNOSIS — E11622 Type 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: Secondary | ICD-10-CM | POA: Diagnosis not present

## 2022-12-07 DIAGNOSIS — I87333 Chronic venous hypertension (idiopathic) with ulcer and inflammation of bilateral lower extremity: Secondary | ICD-10-CM | POA: Diagnosis not present

## 2022-12-07 DIAGNOSIS — L97822 Non-pressure chronic ulcer of other part of left lower leg with fat layer exposed: Secondary | ICD-10-CM | POA: Diagnosis not present

## 2022-12-07 DIAGNOSIS — L03116 Cellulitis of left lower limb: Secondary | ICD-10-CM | POA: Diagnosis not present

## 2022-12-07 DIAGNOSIS — I890 Lymphedema, not elsewhere classified: Secondary | ICD-10-CM | POA: Diagnosis not present

## 2022-12-07 DIAGNOSIS — L03115 Cellulitis of right lower limb: Secondary | ICD-10-CM | POA: Diagnosis not present

## 2022-12-07 DIAGNOSIS — L97812 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d: Secondary | ICD-10-CM | POA: Diagnosis not present

## 2022-12-08 NOTE — Progress Notes (Signed)
[name], [name] ([id number]) [id number] Page 1 of 8 Visit Report for 12/07/2022 Chief Complaint Document Details Patient Name: Date of Service: [name], [name] [name] 12/07/2022 10:45 A M Medical Record Number: [id number] Patient Account Number: [id number] Date of Birth/Sex: Treating RN: 02-10-1950 (72 y.o. F) Primary Care Provider: [name], [name] Other Clinician: Referring Provider: Treating Provider/Extender: [name] [name], [name] Weeks in Treatment: 20 Information Obtained from: Patient Chief Complaint 07/19/2022; bilateral lower extremity wounds 11/30/2022; right lower extremity wound Electronic Signature(s) Signed: 12/07/2022 12:38:41 PM By: [name] DO Entered By: [name] on 12/07/2022 12:30:26 -------------------------------------------------------------------------------- HPI Details Patient Name: Date of Service: [name] Leyden, [place] RIA N 12/07/2022 10:45 A M Medical Record Number: [id number] Patient Account Number: [id number] Date of Birth/Sex: Treating RN: November 09, 1950 (72 y.o. F) Primary Care Provider: [name], [name] Other Clinician: Referring Provider: Treating Provider/Extender: [name] [name], [name] Weeks in Treatment: 20 History of Present Illness HPI Description: Admission 07/19/2022 [name] Is a 72 year old female with a past medical history of endometrial cancer, lymphedema, insulin-dependent type 2 diabetes that presents to the clinic for a 4-week history of weeping to her lower extremities bilaterally. She visited the ED for this issue on 07/07/2022 and she was prescribed doxycycline For bilateral lower extremity cellulitis. She has completed this course. She currently denies signs of infection. She does not have compression stockings. She states she recently gained weight over the past couple months. She has never had wounds before. 7/28; patient presents for follow-up. We have  been using silver alginate under Kerlix/Coban. She is scheduled to get her ABIs with TBI's on 8/7. She states that the wraps slid down to her mid shin. She denies signs of infection. 8/7; patient presents for follow-up. We have been using silver alginate under Kerlix/Coban. She had arterial studies done that showed an ABI of 0.86 on the right and 0.82 on the left with multiphasic waveforms throughout the lower extremities. 8/14; patient presents for follow-up. We have been using silver alginate under 3 layer compression. She tolerated the wrap well. She has no issues or complaints today. 8/21; patient presents for follow-up. We have been using silver alginate under 3 layer compression. She has no issues or complaints today. She has her juxta lite compressions with her. 8/28; patient presents for follow-up. We have been using silver alginate under 3 layer compression. She has no issues or complaints today. 9/5; patient presents for follow-up. We have been using silver alginate under 4 layer compression to both extremities bilaterally. She has her juxta lite compression with her today. 9/12; patient presents for follow-up. We have been using silver alginate under 4-layer compression to the right leg. She was supposed to wear her juxta lite compression to the left lower extremity. She states she could not get it on so she has not been wearing it. She also has not been taking her Lasix. 9/19; patient presents for follow-up. We have been using silver alginate under 4-layer compression to her lower extremities bilaterally. She has restarted taking her Lasix on a daily basis. 9/26; patient presents for follow-up. We have been using silver alginate under 4-layer compression to the lower extremities bilaterally. Her left lower extremity [name], [name] ([id number]) [id number] Page 2 of 8 wounds have healed. She has juxta lite compressions. 10/12; patient presents for follow-up.  Patient had COVID last week and missed her appointment. She states she took her compression wrap off on the right lower  extremity. Her wounds were healed on the left lower extremity at last clinic visit but these have since reopened. She has juxta lite compression and states she has been wearing them. Unfortunately she has multiple scattered wounds now to her right lower extremity as well. No signs of infection on exam. We have offered to order her lymphedema pumps however she states she wants to use her husband's. She has not been doing this. Her diuretic is 60 mg of Lasix twice weekly. She has not been contacted for her venous reflux studies. 10/19; patient presents for follow-up. She is now on 80 mg of Lasix twice daily. She reports significant improvement in her leg swelling. Her wounds have improved greatly. We have been using silver alginate under 4-layer compression. The left leg is healed. She does not have her Velcro compression wrap with her. She has 2 open wounds to the right lower extremity limited to skin breakdown with minimal weeping. She denies signs of infection. 10/26; patient presents for follow-up. The left lower extremity wounds Remain closed. Her right lower extremity still has some scattered open wounds limited to skin breakdown. The wrap slid down slightly to the right lower extremity. She is now taking 80 mg of Lasix in the morning and '40mg'$  in the afternoon. 11/2; the patient was healed out on the left last visit. She did not have a JuxtaLite which she did have was a stocking from [employer]. She used this however predictably the leg is broken down in the left anterior and 2 small areas in the left calf. The right leg really does not look too bad. 11/16; patient presents for follow-up. Her wounds have healed. She has her compression garments with her. 11/30/2022 Patient has reopened wounds to the right lower extremity. There is been a decrease in the Lasix recently and patient has  not been using her lymphedema pump since she was discharged from the clinic 2 weeks ago. She has been using her juxta lite compression style lower extremities bilaterally. Her left leg has no open wounds. 12/8; patient presents for follow-up. Her right lower extremity wounds have healed. We had used 4-layer compression with antibiotic ointment and silver alginate at last clinic visit. She has been using Her juxta lite compression to the left lower extremity. She received new juxta lite compression in the mail. Electronic Signature(s) Signed: 12/07/2022 12:38:41 PM By: [name] DO Entered By: [name] on 12/07/2022 12:31:56 -------------------------------------------------------------------------------- Physical Exam Details Patient Name: Date of Service: [place], [name] RIA N 12/07/2022 10:45 A M Medical Record Number: [id number] Patient Account Number: [id number] Date of Birth/Sex: Treating RN: 1950-09-07 (72 y.o. F) Primary Care Provider: [name], [name] Other Clinician: Referring Provider: Treating Provider/Extender: [name] [name], [name] Weeks in Treatment: 20 Constitutional respirations regular, non-labored and within target range for patient.. Cardiovascular 2+ dorsalis pedis/posterior tibialis pulses. Psychiatric pleasant and cooperative. Notes Right lower extremity: Epithelization to the previous wound site. Good edema control. Electronic Signature(s) Signed: 12/07/2022 12:38:41 PM By: [name] DO Entered By: [name] on 12/07/2022 12:32:55 -------------------------------------------------------------------------------- Physician Orders Details Patient Name: Date of Service: [place], [name] RIA N 12/07/2022 10:45 A M Medical Record Number: [id number] Patient Account Number: [id number] Date of Birth/Sex: Treating RN: 06/14/1950 (72 y.o. [name] Primary Care Provider: MEDINA-V A Jeri [place], [name] Other ClinicianMAKAELAH, CRANFIELD  ([id number]) [id number] Page 3 of 8 Referring Provider: Treating Provider/Extender: [name] [name], [name] Weeks in Treatment: 20 Verbal /  Phone Orders: No Diagnosis Coding Discharge From [facility] Services Discharge from [place] - Call if any future wound care needs. Edema Control - Lymphedema / SCD / Other Lymphedema Pumps. Use Lymphedema pumps on leg(s) 2-3 times a day for 45-60 minutes. If wearing any wraps or hose, do not remove them. Continue exercising as instructed. - 2-3 times a day throughout the day. Elevate legs to the level of the heart or above for 30 minutes daily and/or when sitting, a frequency of: - 3-4 times a day throughout the day. Avoid standing for long periods of time. Patient to wear own compression stockings every day. - both legs every day apply in the morning and remove at night. Exercise regularly Compression stocking or Garment 30-40 mm/Hg pressure to: - both legs. Electronic Signature(s) Signed: 12/07/2022 12:38:41 PM By: [name] DO Entered By: [name] on 12/07/2022 12:33:04 -------------------------------------------------------------------------------- Problem List Details Patient Name: Date of Service: [place], [name] RIA N 12/07/2022 10:45 A M Medical Record Number: [id number] Patient Account Number: [id number] Date of Birth/Sex: Treating RN: 1950/06/13 (72 y.o. F) Primary Care Provider: [name], [name] Other Clinician: Referring Provider: Treating Provider/Extender: [name] [name], [name] Weeks in Treatment: 20 Active Problems ICD-10 Encounter Code Description Active Date MDM Diagnosis L97.812 Non-pressure chronic ulcer of other part of right lower leg with fat layer 07/19/2022 No Yes exposed L97.822 Non-pressure chronic ulcer of other part of left lower leg with fat layer exposed7/20/2023 No Yes I89.0 Lymphedema, not elsewhere classified 07/19/2022 No  Yes I87.333 Chronic venous hypertension (idiopathic) with ulcer and inflammation of 07/19/2022 No Yes bilateral lower extremity E11.622 Type 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 07/19/2022 No Yes 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 07/19/2022 No Yes Inactive Problems Resolved Problems AMNA, WELKER ([id number]) [id number] Page 4 of 8 Electronic Signature(s) Signed: 12/07/2022 12:38:41 PM By: [name] DO Entered By: [name] on 12/07/2022 12:30:02 -------------------------------------------------------------------------------- Progress Note Details Patient Name: Date of Service: [name], [name] RIA N 12/07/2022 10:45 A M Medical Record Number: [id number] Patient Account Number: [id number] Date of Birth/Sex: Treating RN: 06-29-1950 (72 y.o. F) Primary Care Provider: [name], [name] Other Clinician: Referring Provider: Treating Provider/Extender: [name] [name], [name] Weeks in Treatment: 20 Subjective Chief Complaint Information obtained from Patient 07/19/2022; bilateral lower extremity wounds 11/30/2022; right lower extremity wound History of Present Illness (HPI) Admission 07/19/2022 Ms. Yuliza Cara Is a 72 year old female with a past medical history of endometrial cancer, lymphedema, insulin-dependent type 2 diabetes that presents to the clinic for a 4-week history of weeping to her lower extremities bilaterally. She visited the ED for this issue on 07/07/2022 and she was prescribed doxycycline For bilateral lower extremity cellulitis. She has completed this course. She currently denies signs of infection. She does not have compression stockings. She states she recently gained weight over the past couple months. She has never had wounds before. 7/28; patient presents for follow-up. We have been using silver alginate under Kerlix/Coban. She is scheduled to get her ABIs with TBI's on 8/7. She states that the wraps slid  down to her mid shin. She denies signs of infection. 8/7; patient presents for follow-up. We have been using silver alginate under Kerlix/Coban. She had arterial studies done that showed an ABI of 0.86 on the right and 0.82 on the left with multiphasic waveforms throughout the lower extremities. 8/14; patient presents for follow-up. We have been using silver alginate under 3 layer  compression. She tolerated the wrap well. She has no issues or complaints today. 8/21; patient presents for follow-up. We have been using silver alginate under 3 layer compression. She has no issues or complaints today. She has her juxta lite compressions with her. 8/28; patient presents for follow-up. We have been using silver alginate under 3 layer compression. She has no issues or complaints today. 9/5; patient presents for follow-up. We have been using silver alginate under 4 layer compression to both extremities bilaterally. She has her juxta lite compression with her today. 9/12; patient presents for follow-up. We have been using silver alginate under 4-layer compression to the right leg. She was supposed to wear her juxta lite compression to the left lower extremity. She states she could not get it on so she has not been wearing it. She also has not been taking her Lasix. 9/19; patient presents for follow-up. We have been using silver alginate under 4-layer compression to her lower extremities bilaterally. She has restarted taking her Lasix on a daily basis. 9/26; patient presents for follow-up. We have been using silver alginate under 4-layer compression to the lower extremities bilaterally. Her left lower extremity wounds have healed. She has juxta lite compressions. 10/12; patient presents for follow-up. Patient had COVID last week and missed her appointment. She states she took her compression wrap off on the right lower extremity. Her wounds were healed on the left lower extremity at last clinic visit but these  have since reopened. She has juxta lite compression and states she has been wearing them. Unfortunately she has multiple scattered wounds now to her right lower extremity as well. No signs of infection on exam. We have offered to order her lymphedema pumps however she states she wants to use her husband's. She has not been doing this. Her diuretic is 60 mg of Lasix twice weekly. She has not been contacted for her venous reflux studies. 10/19; patient presents for follow-up. She is now on 80 mg of Lasix twice daily. She reports significant improvement in her leg swelling. Her wounds have improved greatly. We have been using silver alginate under 4-layer compression. The left leg is healed. She does not have her Velcro compression wrap with her. She has 2 open wounds to the right lower extremity limited to skin breakdown with minimal weeping. She denies signs of infection. 10/26; patient presents for follow-up. The left lower extremity wounds Remain closed. Her right lower extremity still has some scattered open wounds limited to skin breakdown. The wrap slid down slightly to the right lower extremity. She is now taking 80 mg of Lasix in the morning and '40mg'$  in the afternoon. 11/2; the patient was healed out on the left last visit. She did not have a JuxtaLite which she did have was a stocking from [employer]. She used this however predictably the leg is broken down in the left anterior and 2 small areas in the left calf. The right leg really does not look too bad. 11/16; patient presents for follow-up. Her wounds have healed. She has her compression garments with her. 11/30/2022 Patient has reopened wounds to the right lower extremity. There is been a decrease in the Lasix recently and patient has not been using her lymphedema pump since she was discharged from the clinic 2 weeks ago. She has been using her juxta lite compression style lower extremities bilaterally. Her left leg has no open wounds. 12/8;  patient presents for follow-up. Her right lower extremity wounds have healed. We had used  4-layer compression with antibiotic ointment and silver alginate at last clinic visit. She has been using Her juxta lite compression to the left lower extremity. She received new juxta lite compression in the mail. BLANNIE, SHEDLOCK ([id number]) [id number] Page 5 of 8 Patient History Information obtained from Patient, Chart. Family History Cancer - Father, Diabetes - Siblings. Social History Former smoker - quit 1988, Marital Status - Married, Alcohol Use - Moderate - cocktails x2 a week, Drug Use - No History, Caffeine Use - Never. Medical History Hematologic/Lymphatic Patient has history of Anemia - iron Cardiovascular Patient has history of Arrhythmia - A. Fib 09/10/2022 dx started eliquis, Hypertension Endocrine Patient has history of Type II Diabetes Musculoskeletal Patient has history of Gout, Osteoarthritis Oncologic Patient has history of Received Radiation - 2023 Hospitalization/Surgery History - one ovary removed 2020. - pilonidal cyst extraction 1969. - diverculitis temp colostomy. Medical A Surgical History Notes nd Cardiovascular hyperlipidemia Gastrointestinal GERD Diverticulitis Musculoskeletal carpel tunnel bilateral-surgery to right hand Oncologic endometrial cancer skin Ca Objective Constitutional respirations regular, non-labored and within target range for patient.. Vitals Time Taken: 10:52 AM, Height: 64 in, Weight: 325 lbs, BMI: 55.8, Temperature: 97.6 F, Pulse: 102 bpm, Respiratory Rate: 18 breaths/min, Blood Pressure: 111/78 mmHg. Cardiovascular 2+ dorsalis pedis/posterior tibialis pulses. Psychiatric pleasant and cooperative. General Notes: Right lower extremity: Epithelization to the previous wound site. Good edema control. Integumentary (Hair, Skin) Wound #3 status is Healed - Epithelialized. Original cause of wound was Gradually  Appeared. The date acquired was: 11/21/2022. The wound has been in treatment 1 weeks. The wound is located on the Right,Posterior Lower Leg. The wound measures 0cm length x 0cm width x 0cm depth; 0cm^2 area and 0cm^3 volume. There is no tunneling or undermining noted. There is a medium amount of serosanguineous drainage noted. The wound margin is distinct with the outline attached to the wound base. There is large (67-100%) red, pink granulation within the wound bed. There is no necrotic tissue within the wound bed. The periwound skin appearance did not exhibit: Callus, Crepitus, Excoriation, Induration, Rash, Scarring, Dry/Scaly, Maceration, Atrophie Blanche, Cyanosis, Ecchymosis, Hemosiderin Staining, Mottled, Pallor, Rubor, Erythema. Periwound temperature was noted as No Abnormality. The periwound has tenderness on palpation. Assessment Active Problems ICD-10 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d Non-pressure chronic ulcer of other part of left lower leg with fat layer exposed Lymphedema, not elsewhere classified Chronic venous hypertension (idiopathic) with ulcer and inflammation of bilateral lower extremity Type 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 Malignant neoplasm of endometrium ALYNA, STENSLAND ([id number]) [id number] Page 6 of 8 Patient's right lower extremity wounds have healed. I recommended she use her juxta lite compressions daily to her lower extremities bilaterally. She knows to call with any questions or concerns. She is currently on 80 mg of Lasix once daily. Plan Discharge From [facility] Services: Discharge from [place] - Call if any future wound care needs. Edema Control - Lymphedema / SCD / Other: Lymphedema Pumps. Use Lymphedema pumps on leg(s) 2-3 times a day for 45-60 minutes. If wearing any wraps or hose, do not remove them. Continue exercising as instructed. - 2-3 times a day throughout the  day. Elevate legs to the level of the heart or above for 30 minutes daily and/or when sitting, a frequency of: - 3-4 times a day throughout the day. Avoid standing for long periods of time. Patient to wear own compression stockings every day. - both legs every day apply in the  morning and remove at night. Exercise regularly Compression stocking or Garment 30-40 mm/Hg pressure to: - both legs. 1. Follow-up as needed 2. Juxta lite compressions daily to the lower extremities bilaterally Electronic Signature(s) Signed: 12/07/2022 12:38:41 PM By: [name] DO Entered By: [name] on 12/07/2022 12:33:48 -------------------------------------------------------------------------------- HxROS Details Patient Name: Date of Service: [name] Ley[place], [place] RIA N 12/07/2022 10:45 A M Medical Record Number: [id number] Patient Account Number: [id number] Date of Birth/Sex: Treating RN: 1950-03-15 (72 y.o. F) Primary Care Provider: [name], [name] Other Clinician: Referring Provider: Treating Provider/Extender: [name] [name], [name] Weeks in Treatment: 20 Information Obtained From Patient Chart Hematologic/Lymphatic Medical History: Positive for: Anemia - iron Cardiovascular Medical History: Positive for: Arrhythmia - A. Fib 09/10/2022 dx started eliquis; Hypertension Past Medical History Notes: hyperlipidemia Gastrointestinal Medical History: Past Medical History Notes: GERD Diverticulitis Endocrine Medical History: Positive for: Type II Diabetes Time with diabetes: 72 years old Treated with: Insulin, Diet Blood sugar tested every day: Yes Tested : three times a week. Musculoskeletal Medical History: Positive for: Gout; Osteoarthritis GIADA, SCHOPPE ([id number]) [id number] Page 7 of 8 Past Medical History Notes: carpel tunnel bilateral-surgery to right hand Oncologic Medical History: Positive for: Received Radiation - 2023 Past  Medical History Notes: endometrial cancer skin Ca Immunizations Pneumococcal Vaccine: Received Pneumococcal Vaccination: Yes Received Pneumococcal Vaccination On or After 60th Birthday: No Implantable Devices No devices added Hospitalization / Surgery History Type of Hospitalization/Surgery one ovary removed 2020 pilonidal cyst extraction 1969 diverculitis temp colostomy Family and Social History Cancer: Yes - Father; Diabetes: Yes - Siblings; Former smoker - quit 1988; Marital Status - Married; Alcohol Use: Moderate - cocktails x2 a week; Drug Use: No History; Caffeine Use: Never; Financial Concerns: No; Food, Clothing or Shelter Needs: No; Support System Lacking: No; Transportation Concerns: No Electronic Signature(s) Signed: 12/07/2022 12:38:41 PM By: [name] DO Entered By: [name] on 12/07/2022 12:32:01 -------------------------------------------------------------------------------- SuperBill Details Patient Name: Date of Service: [name] 12/07/2022 Medical Record Number: [id number] Patient Account Number: [id number] Date of Birth/Sex: Treating RN: 08/13/1950 (72 y.o. [name] Primary Care Provider: [name], [name] Other Clinician: Referring Provider: Treating Provider/Extender: [name] [name], [name] Weeks in Treatment: 20 Diagnosis Coding ICD-10 Codes Code Description [phone] 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d L97.822 Non-pressure chronic ulcer of other part of left lower leg with fat layer exposed I89.0 Lymphedema, not elsewhere classified I87.333 Chronic venous hypertension (idiopathic) with ulcer and inflammation of bilateral lower extremity E11.622 Type 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 Facility Procedures : CPT4 Code: [id number] Description: 81856 - WOUND CARE VISIT-LEV 2 EST PT Modifier: Quantity:  1 Physician Procedures : CPT4 Code Description Modifier [id number] - WC PHYS LEVEL 3 - EST PT ICD-10 Diagnosis Description Y85.027 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d [place], Jinny Sanders ([id number])  [id number]. E72.094 Chronic venous hypertension (idiopathic) with ulcer and inflammation of bilateral lower extremity I89.0 Lymphedema, not elsewhere classified E11.622 Type 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 Quantity: 1 pdf Page 8 of 8 Electronic Signature(s) Signed: 12/07/2022 12:38:41 PM By: [name] DO Entered By: [name] on 12/07/2022 12:34:10

## 2022-12-12 NOTE — Progress Notes (Signed)
[name], NELMS ([id number]) [id number] Page 1 of 7 Visit Report for 12/07/2022 Arrival Information Details Patient Name: Date of Service: [name], [name] 12/07/2022 10:45 A M Medical Record Number: [id number] Patient Account Number: [id number] Date of Birth/Sex: Treating RN: [birth year] (72 y.o. F) Primary Care Corry Storie: [name] [name] Other Clinician: Referring Donnelle Rubey: Treating Xian Apostol/Extender: [name] [name] Weeks in Treatment: 20 Visit Information History Since Last Visit Added or deleted any medications: No Patient Arrived: Wheel Chair Any new allergies or adverse reactions: No Arrival Time: 10:49 Had a fall or experienced change in No Accompanied By: self activities of daily living that may affect Transfer Assistance: None risk of falls: Patient Identification Verified: Yes Signs or symptoms of abuse/neglect since last visito No Secondary Verification Process Completed: Yes Hospitalized since last visit: No Patient Requires Transmission-Based No Implantable device outside of the clinic excluding No Precautions: cellular tissue based products placed in the center Patient Has Alerts: Yes since last visit: Patient Alerts: Patient on Blood Thinner Has Compression in Place as Prescribed: Yes 08/06/2022 TBI L 0.59 R0.65 Pain Present Now: No 08/06/2022 ABI R 0.86 L0.82 Electronic Signature(s) Signed: 12/12/2022 12:00:46 PM By: [name] Entered By: [name] on 12/07/2022 10:52:13 -------------------------------------------------------------------------------- Clinic Level of Care Assessment Details Patient Name: Date of Service: [name], [name] 12/07/2022 10:45 A M Medical Record Number: [id number] Patient Account Number: [id number] Date of Birth/Sex: Treating RN: 04/10/1950 (72 y.o. [name] Primary Care Hydie Langan: [name] [name] Other Clinician: Referring Ziah Leandro: Treating  Arish Redner/Extender: [name] [name] [name] Weeks in Treatment: 20 Clinic Level of Care Assessment Items TOOL 4 Quantity Score X- 1 0 Use when only an EandM is performed on FOLLOW-UP visit ASSESSMENTS - Nursing Assessment / Reassessment X- 1 10 Reassessment of Co-morbidities (includes updates in patient status) X- 1 5 Reassessment of Adherence to Treatment Plan ASSESSMENTS - Wound and Skin A ssessment / Reassessment X - Simple Wound Assessment / Reassessment - one wound 1 5 '[]'$  - 0 Complex Wound Assessment / Reassessment - multiple wounds X- 1 10 Dermatologic / Skin Assessment (not related to wound area) ASSESSMENTS - Focused Assessment X- 1 5 Circumferential Edema Measurements - multi extremities '[]'$  - 0 Nutritional Assessment / Counseling / Intervention ANTRICE, PAL ([id number]) [id number] Page 2 of 7 '[]'$  - 0 Lower Extremity Assessment (monofilament, tuning fork, pulses) '[]'$  - 0 Peripheral Arterial Disease Assessment (using hand held doppler) ASSESSMENTS - Ostomy and/or Continence Assessment and Care '[]'$  - 0 Incontinence Assessment and Management '[]'$  - 0 Ostomy Care Assessment and Management (repouching, etc.) PROCESS - Coordination of Care X - Simple Patient / Family Education for ongoing care 1 15 '[]'$  - 0 Complex (extensive) Patient / Family Education for ongoing care X- 1 10 Staff obtains Programmer, systems, Records, T Results / Process Orders est '[]'$  - 0 Staff telephones HHA, Nursing Homes / Clarify orders / etc '[]'$  - 0 Routine Transfer to another Facility (non-emergent condition) '[]'$  - 0 Routine Hospital Admission (non-emergent condition) '[]'$  - 0 New Admissions / Biomedical engineer / Ordering NPWT Apligraf, etc. , '[]'$  - 0 Emergency Hospital Admission (emergent condition) X- 1 10 Simple Discharge Coordination '[]'$  - 0 Complex (extensive) Discharge Coordination PROCESS - Special Needs '[]'$  - 0 Pediatric / Minor Patient  Management '[]'$  - 0 Isolation Patient Management '[]'$  - 0 Hearing / Language / Visual special needs '[]'$  - 0 Assessment of Community assistance (transportation, D/C planning, etc.) '[]'$  -  0 Additional assistance / Altered mentation '[]'$  - 0 Support Surface(s) Assessment (bed, cushion, seat, etc.) INTERVENTIONS - Wound Cleansing / Measurement '[]'$  - 0 Simple Wound Cleansing - one wound '[]'$  - 0 Complex Wound Cleansing - multiple wounds '[]'$  - 0 Wound Imaging (photographs - any number of wounds) '[]'$  - 0 Wound Tracing (instead of photographs) '[]'$  - 0 Simple Wound Measurement - one wound '[]'$  - 0 Complex Wound Measurement - multiple wounds INTERVENTIONS - Wound Dressings '[]'$  - 0 Small Wound Dressing one or multiple wounds '[]'$  - 0 Medium Wound Dressing one or multiple wounds '[]'$  - 0 Large Wound Dressing one or multiple wounds '[]'$  - 0 Application of Medications - topical '[]'$  - 0 Application of Medications - injection INTERVENTIONS - Miscellaneous '[]'$  - 0 External ear exam '[]'$  - 0 Specimen Collection (cultures, biopsies, blood, body fluids, etc.) '[]'$  - 0 Specimen(s) / Culture(s) sent or taken to Lab for analysis '[]'$  - 0 Patient Transfer (multiple staff / Civil Service fast streamer / Similar devices) '[]'$  - 0 Simple Staple / Suture removal (25 or less) '[]'$  - 0 Complex Staple / Suture removal (26 or more) '[]'$  - 0 Hypo / Hyperglycemic Management (close monitor of Blood Glucose) Shader, Eupha ([id number]) [id number] Page 3 of 7 '[]'$  - 0 Ankle / Brachial Index (ABI) - do not check if billed separately X- 1 5 Vital Signs Has the patient been seen at the hospital within the last three years: Yes Total Score: 75 Level Of Care: New/Established - Level 2 Electronic Signature(s) Signed: 12/07/2022 3:28:23 PM By: [name] RN, BSN Entered By: [name] on 12/07/2022 11:17:41 -------------------------------------------------------------------------------- Encounter Discharge Information  Details Patient Name: Date of Service: [name] 12/07/2022 10:45 A M Medical Record Number: [id number] Patient Account Number: [id number] Date of Birth/Sex: Treating RN: 17-Apr-1950 (72 y.o. [name] Primary Care Monseratt Ledin: [name] [name] Other Clinician: Referring Lakeva Hollon: Treating Caden Fukushima/Extender: [name] [name] [name] Weeks in Treatment: 20 Encounter Discharge Information Items Discharge Condition: Stable Ambulatory Status: Wheelchair Discharge Destination: Home Transportation: Private Auto Accompanied By: self Schedule Follow-up Appointment: No Clinical Summary of Care: Notes Phoned rep Josh from [facility] that patient only received one juxtalite HD for her right leg and no one called her to offer pricing for the left leg. Explained patient was confused about calling or should have received 2 instead of 1. Josh to call her and offer pricing for the left leg. Electronic Signature(s) Signed: 12/07/2022 3:28:23 PM By: [name] RN, BSN Entered By: [name] on 12/07/2022 11:38:21 -------------------------------------------------------------------------------- Lower Extremity Assessment Details Patient Name: Date of Service: [name], [name] RIA [name] 12/07/2022 10:45 A M Medical Record Number: [id number] Patient Account Number: [id number] Date of Birth/Sex: Treating RN: 11-23-1950 (72 y.o. F) Primary Care Hayden Mabin: [name] Other Clinician: Referring Advika Mclelland: Treating Ainslie Mazurek/Extender: [name] [name] Weeks in Treatment: 20 Edema Assessment Assessed: [Left: No] [Right: No] Edema: [Left: Ye] [Right: s] Calf Left: Right: Point of Measurement: 39 cm From Medial Instep 41 cm Ankle Left: Right: Point of Measurement: 10 cm From Medial Instep 26 cm [name], [name] ([id number]) [id number] Page 4 of 7 Vascular Assessment Pulses: Dorsalis Pedis Palpable:  [Right:Yes] Electronic Signature(s) Signed: 12/12/2022 12:00:46 PM By: [name] Entered By: [name] on 12/07/2022 11:04:24 -------------------------------------------------------------------------------- Multi Wound Chart Details Patient Name: Date of Service: [name] 12/07/2022 10:45 A M Medical Record Number: [id number] Patient Account Number: [id number] Date  of Birth/Sex: Treating RN: [birth year] (72 y.o. F) Primary Care Cianni Manny: [name] [name] Other Clinician: Referring Linda Grimmer: Treating Eyden Dobie/Extender: [name] [name] Weeks in Treatment: 20 Vital Signs Height(in): 64 Pulse(bpm): 102 Weight(lbs): 325 Blood Pressure(mmHg): 111/78 Body Mass Index(BMI): 55.8 Temperature(F): 97.6 Respiratory Rate(breaths/min): 18 [3:Photos:] [[name]:[name]] Right, Posterior Lower Leg [name] [name] Wound Location: Gradually Appeared [name] [name] Wounding Event: Venous Leg Ulcer [name] [name] Primary Etiology: Anemia, Arrhythmia, Hypertension, [name] [name] Comorbid History: Type II Diabetes, Gout, Osteoarthritis, Received Radiation 11/21/2022 [name] [name] Date Acquired: 1 [name] [name] Weeks of Treatment: Healed - Epithelialized [name] [name] Wound Status: No [name] [name] Wound Recurrence: Yes [name] [name] Clustered Wound: 3 [name] [name] Clustered Quantity: 0x0x0 [name] [name] Measurements L x W x D (cm) 0 [name] [name] A (cm) : rea 0 [name] [name] Volume (cm) : 100.00% [name] [name] % Reduction in Area: 100.00% [name] [name] % Reduction in Volume: Full Thickness Without Exposed [name] [name] Classification: Support Structures Medium [name] [name] Exudate Amount: Serosanguineous [name] [name] Exudate Type: red, brown [name] [name] Exudate Color: Distinct, outline attached [name] [name] Wound Margin: Large (67-100%) [name] [name] Granulation Amount: Red, Pink [name] [name] Granulation Quality: None Present (0%) [name] [name] Necrotic Amount: Fascia: No [name] [name] Exposed Structures: Fat Layer (Subcutaneous Tissue): No Tendon:  No Muscle: No Joint: No [name], [name] ([id number]) [id number] Page 5 of 7 Bone: No Small (1-33%) [name] [name] Epithelialization: Excoriation: No [name] [name] Periwound Skin Texture: Induration: No Callus: No Crepitus: No Rash: No Scarring: No Maceration: No [name] [name] Periwound Skin Moisture: Dry/Scaly: No Atrophie Blanche: No [name] [name] Periwound Skin Color: Cyanosis: No Ecchymosis: No Erythema: No Hemosiderin Staining: No Mottled: No Pallor: No Rubor: No No Abnormality [name] [name] Temperature: Yes [name] [name] Tenderness on Palpation: Treatment Notes Electronic Signature(s) Signed: 12/07/2022 12:38:41 PM By: [name] Shan DO Entered By: [name] on 12/07/2022 12:30:08 -------------------------------------------------------------------------------- Multi-Disciplinary Care Plan Details Patient Name: Date of Service: [name], [name] RIA [name] 12/07/2022 10:45 A M Medical Record Number: [id number] Patient Account Number: [id number] Date of Birth/Sex: Treating RN: [birth year] (72 y.o. [name] Primary Care Shiron Whetsel: [facility] [name] Other Clinician: Referring Benigno Check: Treating Adabella Stanis/Extender: [name] [name] Weeks in Treatment: 20 Active Inactive Electronic Signature(s) Signed: 12/07/2022 3:28:23 PM By: [name] RN, BSN Entered By: [name] on 12/07/2022 11:17:02 -------------------------------------------------------------------------------- Pain Assessment Details Patient Name: Date of Service: [name] 12/07/2022 10:45 A M Medical Record Number: [id number] Patient Account Number: [id number] Date of Birth/Sex: Treating RN: 1950/10/27 (72 y.o. F) Primary Care Joshlynn Alfonzo: [name] [name] Other Clinician: Referring Orlanda Lemmerman: Treating Gaspar Fowle/Extender: [name] [name] Weeks in Treatment: 20 Active Problems Location of Pain Severity and Description of Pain Patient Has  Paino No Site Locations [place], [place] ([id number]) [id number] Page 6 of 7 Pain Management and Medication Current Pain Management: Electronic Signature(s) Signed: 12/12/2022 12:00:46 PM By: [name] Entered By: [name] on 12/07/2022 10:52:47 -------------------------------------------------------------------------------- Wound Assessment Details Patient Name: Date of Service: [place], [name] RIA [name] 12/07/2022 10:45 A M Medical Record Number: [id number] Patient Account Number: [id number] Date of Birth/Sex: Treating RN: 01/15/1950 (72 y.o. [name] Primary Care Madissen Wyse: [name] [name] Other Clinician: Referring Daveah Varone: Treating Hope Brandenburger/Extender: [name] [name] [name] Weeks in Treatment: 20 Wound Status Wound Number: 3 Primary Venous Leg Ulcer Etiology: Wound Location: Right, Posterior Lower Leg Wound Healed -  Epithelialized Wounding Event: Gradually Appeared Status: Date Acquired: 11/21/2022 Comorbid Anemia, Arrhythmia, Hypertension, Type II Diabetes, Gout, Weeks Of Treatment: 1 History: Osteoarthritis, Received Radiation Clustered Wound: Yes Photos Wound Measurements Length: (cm) Width: (cm) Depth: (cm) Clustered Quantity: Area: (cm) Volume: (cm) 0 % Reduction in Area: 100% 0 % Reduction in Volume: 100% 0 Epithelialization: Small (1-33%) 3 Tunneling: No 0 Undermining: No 0 Wound Description Hamrick, [name] ([id number]) Classification: Full Thickness Without Exposed Support Structures Wound Margin: Distinct, outline attached Exudate Amount: Medium Exudate Type: Serosanguineous Exudate Color: red, brown [id number] Page 7 of 7 Foul Odor After Cleansing: No Slough/Fibrino Yes Wound Bed Granulation Amount: Large (67-100%) Exposed Structure Granulation Quality: Red, Pink Fascia Exposed: No Necrotic Amount: None Present (0%) Fat Layer (Subcutaneous Tissue) Exposed:  No Tendon Exposed: No Muscle Exposed: No Joint Exposed: No Bone Exposed: No Periwound Skin Texture Texture Color No Abnormalities Noted: No No Abnormalities Noted: No Callus: No Atrophie Blanche: No Crepitus: No Cyanosis: No Excoriation: No Ecchymosis: No Induration: No Erythema: No Rash: No Hemosiderin Staining: No Scarring: No Mottled: No Pallor: No Moisture Rubor: No No Abnormalities Noted: No Dry / Scaly: No Temperature / Pain Maceration: No Temperature: No Abnormality Tenderness on Palpation: Yes Electronic Signature(s) Signed: 12/07/2022 3:28:23 PM By: [name] RN, BSN Entered By: [name] on 12/07/2022 11:16:03 -------------------------------------------------------------------------------- Vitals Details Patient Name: Date of Service: [name] [place], [place] RIA [name] 12/07/2022 10:45 A M Medical Record Number: [id number] Patient Account Number: [id number] Date of Birth/Sex: Treating RN: 1950/04/11 (72 y.o. F) Primary Care Tamar Lipscomb: [name] [name] Other Clinician: Referring Atheena Spano: Treating Nahmir Zeidman/Extender: [name] [name] Weeks in Treatment: 20 Vital Signs Time Taken: 10:52 Temperature (F): 97.6 Height (in): 64 Pulse (bpm): 102 Weight (lbs): 325 Respiratory Rate (breaths/min): 18 Body Mass Index (BMI): 55.8 Blood Pressure (mmHg): 111/78 Reference Range: 80 - 120 mg / dl Electronic Signature(s) Signed: 12/12/2022 12:00:46 PM By: [name] Entered By: [name] on 12/07/2022 10:52:38

## 2022-12-13 ENCOUNTER — Telehealth: Payer: Self-pay | Admitting: Gynecologic Oncology

## 2022-12-13 NOTE — Telephone Encounter (Signed)
Called the patient. Discussed recent MRI. She has not received disc with imaging from prior scans in [place]. I encouraged her to call again. I will also ask my office to call. Being able to compare imaging would be very help in determining whether MRI findings more likely represent post-treatment changes versus concern for recurrent malignancy.  Jeral Pinch MD Gynecologic Oncology

## 2022-12-19 ENCOUNTER — Ambulatory Visit: Payer: Medicare Other | Attending: Internal Medicine

## 2022-12-19 DIAGNOSIS — Z79899 Other long term (current) drug therapy: Secondary | ICD-10-CM

## 2022-12-19 DIAGNOSIS — I4891 Unspecified atrial fibrillation: Secondary | ICD-10-CM

## 2022-12-19 DIAGNOSIS — I10 Essential (primary) hypertension: Secondary | ICD-10-CM

## 2022-12-20 LAB — PRO B NATRIURETIC PEPTIDE: NT-Pro BNP: 1229 pg/mL — ABNORMAL HIGH (ref 0–301)

## 2022-12-20 LAB — BASIC METABOLIC PANEL
BUN/Creatinine Ratio: 26 (ref 12–28)
BUN: 33 mg/dL — ABNORMAL HIGH (ref 8–27)
CO2: 23 mmol/L (ref 20–29)
Calcium: 10 mg/dL (ref 8.7–10.3)
Chloride: 100 mmol/L (ref 96–106)
Creatinine, Ser: 1.29 mg/dL — ABNORMAL HIGH (ref 0.57–1.00)
Glucose: 133 mg/dL — ABNORMAL HIGH (ref 70–99)
Potassium: 4.6 mmol/L (ref 3.5–5.2)
Sodium: 138 mmol/L (ref 134–144)
eGFR: 44 mL/min/{1.73_m2} — ABNORMAL LOW (ref >59)

## 2022-12-28 ENCOUNTER — Encounter: Payer: Self-pay | Admitting: Gynecologic Oncology

## 2022-12-28 ENCOUNTER — Inpatient Hospital Stay: Payer: Medicare Other | Attending: Gynecologic Oncology | Admitting: Gynecologic Oncology

## 2022-12-28 VITALS — BP 115/48 | HR 77 | Temp 97.6°F | Resp 16 | Ht 64.0 in | Wt 309.0 lb

## 2022-12-28 DIAGNOSIS — Z79811 Long term (current) use of aromatase inhibitors: Secondary | ICD-10-CM | POA: Insufficient documentation

## 2022-12-28 DIAGNOSIS — C541 Malignant neoplasm of endometrium: Secondary | ICD-10-CM | POA: Diagnosis not present

## 2022-12-28 DIAGNOSIS — Z923 Personal history of irradiation: Secondary | ICD-10-CM | POA: Insufficient documentation

## 2022-12-28 DIAGNOSIS — Z6841 Body Mass Index (BMI) 40.0 and over, adult: Secondary | ICD-10-CM | POA: Insufficient documentation

## 2022-12-28 DIAGNOSIS — N736 Female pelvic peritoneal adhesions (postinfective): Secondary | ICD-10-CM | POA: Diagnosis not present

## 2022-12-28 NOTE — Progress Notes (Signed)
Gynecologic Oncology Return Clinic Visit  12/28/22  Reason for Visit: Surveillance visit in the setting of endometrial cancer being treated hormonally   Treatment History: Patient reports a history of postmenopausal bleeding since July 2020.  Is been persistent low low level bleeding, spotting but no heavy passage of clots.  She reported this to her gynecologist who ordered a transvaginal ultrasound on June 24, 2019.  This revealed a uterus measuring 6.7 x 3.4 x 5.5 cm with an endometrial thickness of 1.4 cm.  The ovaries were not visualized.   She was taken to the operating room for a curettage and polypectomy on July 17, 2019 which revealed FIGO grade 1 endometrioid adenocarcinoma.   Patient has a complex medical history.  She is morbidly obese with a BMI of 48 kg/m.  She experienced ruptured diverticulitis in 1997 which required a diverting colostomy and Hartman's pouch.  She 6 weeks later she was taken to the operating room for laparotomy and reversal of Hartman's.  She reports that the surgeon described her abdomen is looking like a gallon of superglue had been poured inside her.  It was an extensively long surgery.  She is unsure if additional bowel resections were necessary.  She had a 1 month hospitalization following this reversal of colostomy.  She developed a bowel obstruction during that hospitalization and required reoperation to resolve this.   In 2000 she developed a growth on an ovary (she cannot remember which side).  For this she underwent laparotomy and oophorectomy.  This surgery was performed in [place].  She was planning to have a hysterectomy at that procedure, however the surgeon aborted plans for hysterectomy and performed only oophorectomy after encountering significant adhesive disease.  She states that he told her surgery would have needed to the last 6 to 8 hours in order to perform hysterectomy due to the adhesive disease.   The patient has type 2 diabetes mellitus  and has had this for greater than 30 years.  She reports having no endorgan disease.  Her hemoglobin A1c was 5.4% in May 2020.   Her family history is significant for a father with skin cancer.  She has 3 sisters with cancer including one with endometrial cancer who is obese, a sister with stage II 3 breast cancer, and multiple myeloma.   She has had 2 prior vaginal deliveries.   She lives in [place].   On August 20, 2019 she underwent an attempt at a robotic assisted total hysterectomy BSO and sentinel lymph node biopsy.  The surgery was aborted after laparoscopic entry into the left upper quadrant encountered dense adhesions throughout the entirety of the abdomen which included dense colonic and small bowel adhesions to the anterior abdominal wall.  The density of these adhesions and their comprehensive nature across the entirety of her abdominal wall meant that it was not possible to place additional laparoscopic ports to perform adhesiolysis.  She would have required a laparotomy in order to achieve hysterectomy, and the patient had declined this preoperatively, and had elected to proceed with D&C with progestin releasing IUD placement if hysterectomy was not possible via minimally invasive surgical route.  Therefore we aborted the laparoscopic procedure and performed a D&C procedure with placement of a progestin releasing IUD.   Final pathology from the [facility] specimen showed adenocarcinoma favor endometrial primary.  It was difficult to grade the specimen due to his fragmented nature.   Repeat biopsy (office) sampling in December, 2020 showed complete clinical response with no residual  carcinoma.    The patient then transferred care to Dr. Sheilah Mins in [name].  Repeat endometrial biopsy on 03/23/2021 showed residual endometrioid adenocarcinoma.   Repeat biopsy on 06/22/2021 showed FIGO grade 2 endometrioid adenocarcinoma.  There is a comment that her Megace, which must of been started prior to  this, was increased to 160 mg twice daily.   Endometrial biopsy on 09/22/2021 showed residual endometrioid adenocarcinoma, FIGO grade 1-2, in the background of endometrium with exogenous hormone effect.   Pelvic MRI on 12/05/2021 noted a 3.3 cm simple left ovarian cyst, colonic diverticulosis, anterior endometrial mass in the uterine body with an IUD adjacent, suspicious for endometrial cancer.   Repeat endometrial biopsy on 02/01/2022 showed residual endometrial carcinoma, FIGO grade 1.   Staging CT of the chest, abdomen, and pelvis on 02/15/2022 revealed no adenopathy, no aggressive appearing osseous lesions.  Diastases recti with superimposed shallow ventral wall hernia measuring 1 x 7 cm.  Cholelithiasis noted without acute cholecystitis.   03/27/22: IUD removed.   Patient proceeded with definitive radiation therapy from 03/06/2022 - 04/18/2022.  She was treated with EBRT (4500 cGy in 25 fractions) utilizing IMRT treatment technique.  This was followed by weekly T&O HDR treatment to a total dose of 3000 cGy in 5 fractions.  Total treatment dose was 7500 cGy.  Treatment was tolerated well other than fatigue and mild loose stools.   Posttreatment pelvic MRI on 06/12/2022 showed a slightly less bulky uterus.  Significant decrease in the endometrium compared to previous exam with endometrium now measuring 4 mm in thickness (previously was 2.7 cm).  Slight heterogenous signal in the endometrium.  No adenopathy.   She was last seen for follow-up on 06/20/2022 with radiation oncology.  She was doing well at that time without any vaginal bleeding or cramping.  Interval History: Patient reports doing well.  Denies any vaginal bleeding.  Has baseline discharge, unchanged.  Continues to have frequent diarrhea, uses Imodium with good relief.  Has urinary incontinence, also at baseline.  Denies any pelvic or abdominal pain.  Last colonoscopy in 2023 in [place].  Past Medical/Surgical History: Past Medical History:   Diagnosis Date   Arthritis    Carpal tunnel syndrome    bilateral, had surgery on right hand   Diabetes mellitus without complication ([place])    Diverticulitis    Endometrial cancer ([place])    GERD (gastroesophageal reflux disease)    Gout    History of iron deficiency    Hyperlipidemia    Hypertension    Skin cancer     Past Surgical History:  Procedure Laterality Date   CARPAL TUNNEL RELEASE Right    COLON SURGERY     COLONOSCOPY     x2   COLOSTOMY     COLOSTOMY REVERSAL     DILATATION & CURETTAGE/HYSTEROSCOPY WITH MYOSURE N/A 07/17/2019   Procedure: DILATATION & CURETTAGE/HYSTEROSCOPY WITH MYOSURE;  Surgeon: Louretta Shorten, MD;  Location: [facility];  Service: Gynecology;  Laterality: N/A;   OOPHORECTOMY     unilateral   PILONIDAL CYST EXCISION     ROBOTIC ASSISTED TOTAL HYSTERECTOMY WITH BILATERAL SALPINGO OOPHERECTOMY N/A 08/20/2019   Procedure: DIAGNSOTIC LAPAROSCOPY, DILATION AND CURETTAGE, IUD PLACEMENT ;  Surgeon: Everitt Amber, MD;  Location: [facility];  Service: Gynecology;  Laterality: N/A;   SKIN CANCER EXCISION      Family History  Problem Relation Age of Onset   Diabetes Sister    Skin cancer Father     Social History  Socioeconomic History   Marital status: Married    Spouse name: Not on file   Number of children: Not on file   Years of education: Not on file   Highest education level: Not on file  Occupational History   Not on file  Tobacco Use   Smoking status: Former    Packs/day: 3.00    Years: 22.00    Total pack years: 66.00    Types: Cigarettes    Quit date: 06/08/1987    Years since quitting: 35.5   Smokeless tobacco: Never  Vaping Use   Vaping Use: Never used  Substance and Sexual Activity   Alcohol use: Yes    Comment: twice per week   Drug use: No   Sexual activity: Yes    Birth control/protection: Post-menopausal  Other Topics Concern   Not on file  Social History Narrative   Not on file   Social Determinants of  Health   Financial Resource Strain: Not on file  Food Insecurity: Not on file  Transportation Needs: Not on file  Physical Activity: Not on file  Stress: Not on file  Social Connections: Not on file    Current Medications:  Current Outpatient Medications:    allopurinol (ZYLOPRIM) 300 MG tablet, TAKE 1 TABLET BY MOUTH EVERY DAY, Disp: 90 tablet, Rfl: 1   amLODipine (NORVASC) 5 MG tablet, TAKE 1 TABLET (5 MG TOTAL) BY MOUTH DAILY., Disp: 90 tablet, Rfl: 1   apixaban (ELIQUIS) 5 MG TABS tablet, Take 1 tablet (5 mg total) by mouth 2 (two) times daily., Disp: 180 tablet, Rfl: 3   atorvastatin (LIPITOR) 80 MG tablet, Take 1 tablet (80 mg total) by mouth at bedtime., Disp: 90 tablet, Rfl: 3   Carboxymethylcellul-Glycerin (LUBRICATING EYE DROPS OP), Place 1 drop into both eyes daily as needed (dry eyes)., Disp: , Rfl:    carvedilol (COREG) 12.5 MG tablet, Take 1 tablet (12.5 mg total) by mouth 2 (two) times daily with a meal., Disp: 180 tablet, Rfl: 3   Cholecalciferol (VITAMIN D3 PO), Take 1 capsule by mouth every evening. , Disp: , Rfl:    Coenzyme Q10 (COQ10) 100 MG CAPS, Take 100 mg by mouth every evening., Disp: , Rfl:    famotidine (PEPCID) 20 MG tablet, Take 20 mg by mouth daily. , Disp: , Rfl:    furosemide (LASIX) 40 MG tablet, Take 2 tablets (80 mg) in the morning., Disp: 270 tablet, Rfl: 3   GLUCOSAMINE-CHONDROITIN PO, Take 1 tablet by mouth 2 (two) times daily., Disp: , Rfl:    insulin NPH-regular Human (70-30) 100 UNIT/ML injection, Inject 70 Units into the skin daily. 70 units In the morning and 50 units in the evening, Disp: , Rfl:    JARDIANCE 10 MG TABS tablet, , Disp: , Rfl:    KRILL OIL PO, Take 1 capsule by mouth daily., Disp: , Rfl:    losartan (COZAAR) 50 MG tablet, Take 50 mg by mouth daily., Disp: , Rfl:    meclizine (ANTIVERT) 12.5 MG tablet, Take 1 tablet (12.5 mg total) by mouth 3 (three) times daily as needed for dizziness., Disp: 30 tablet, Rfl: 0   Multiple  Vitamins-Minerals (MULTIVITAMIN WITH MINERALS) tablet, Take 1 tablet by mouth daily., Disp: , Rfl:    oxybutynin (DITROPAN-XL) 10 MG 24 hr tablet, TAKE 1 TABLET BY MOUTH EVERYDAY AT BEDTIME, Disp: 90 tablet, Rfl: 1   potassium chloride (KLOR-CON) 10 MEQ tablet, Take 1 tablet (10 mEq total) by mouth 2 (two) times  daily., Disp: 180 tablet, Rfl: 3   letrozole (FEMARA) 2.5 MG tablet, Take 1 tablet by mouth daily., Disp: , Rfl:    traMADol (ULTRAM) 50 MG tablet, , Disp: , Rfl:   Review of Systems: + incontinence, problem with walking Denies appetite changes, fevers, chills, fatigue, unexplained weight changes. Denies hearing loss, neck lumps or masses, mouth sores, ringing in ears or voice changes. Denies cough or wheezing.  Denies shortness of breath. Denies chest pain or palpitations. Denies leg swelling. Denies abdominal distention, pain, blood in stools, constipation, diarrhea, nausea, vomiting, or early satiety. Denies pain with intercourse, dysuria, frequency, hematuria. Denies hot flashes, pelvic pain, vaginal bleeding or vaginal discharge.   Denies joint pain, back pain or muscle pain/cramps. Denies itching, rash, or wounds. Denies dizziness, headaches, numbness or seizures. Denies swollen lymph nodes or glands, denies easy bruising or bleeding. Denies anxiety, depression, confusion, or decreased concentration.  Physical Exam: BP (!) 115/48 (BP Location: Left Wrist, Patient Position: Sitting)   Pulse 77   Temp 97.6 F (36.4 C) (Oral)   Resp 16   Ht _0  (1.626 m)   Wt (!) 309 lb (140.2 kg)   SpO2 100%   BMI 53.04 kg/m  General: Alert, oriented, no acute distress. HEENT: Normocephalic, atraumatic, sclera anicteric. Chest: Somewhat limited by body habitus but clear to auscultation bilaterally.   Cardiovascular: regular rate, regular rhythm, no murmurs. Abdomen: Obese, soft, nontender.  Normoactive bowel sounds.  No masses or hepatosplenomegaly appreciated.  Subcutaneous edema in  pannus. Extremities: Somewhat limited range of motion.  Warm, well perfused.  Significant lower extremity edema, compression socks on bilateral lower legs with wraps on lower legs. Lymphatics: No cervical, supraclavicular, or inguinal adenopathy. GU: Normal appearing external genitalia without erythema, excoriation, or lesions.  Speculum exam reveals moderately atrophic vaginal mucosa.  Cervix is somewhat difficult to visualize and rather flush with the vaginal apex, atrophic.  No bleeding or discharge.  Bimanual exam reveals no masses or nodularity, cervix flush with the vagina.  Uterine assessment is limited by body habitus.  Rectovaginal exam confirms findings, no parametrial nodularity.  Laboratory & Radiologic Studies: MRI Pelvic 12/03/22: Repeat imaging was performed.   FINDINGS: Loss of normal differentiation between endometrium and zonal anatomy in general in the fundus and mid uterus is as described on previous imaging.   Diffusion properties in this area are nondescript with no focal area of marked restricted diffusion. Enhancement of the uterus is again noted to be diffuse in the fundus and mid uterus.   LEFT adnexal cyst with simple appearance.  Small volume ascites.   Mild perivesical stranding all without change.   In summary:   Loss of normal zonal anatomy in the uterine fundus with heterogeneity and trans mural enhancement of the uterus is nonspecific. No focal area of restricted diffusion. Imaging findings are nonspecific. It would be difficult to exclude any areas of residual disease though findings could reflect baseline post treatment related changes. No priors are available for comparison.   Small volume ascites may be reactive. Mild perivesical stranding could reflect mild cystitis in the appropriate clinical setting.  Assessment & Plan: [name] is a 72 y.o. woman with clinical stage I grade 1 endometrioid endometrial cancer, non-operative candidate s/p D&C  and IUD (progestin releasing) on 08/20/19. Complete clinical response to progestin therapy with negative repeat sampling in December, 2020. Developed recurrence based on biopsy in 02/2021. She was started on Megace which was increased in 05/2022 when biopsy showed FIGO grade 2  endometrial cancer. Given persistent malignancy on biopsy, IUD removed 3/023 and patietn treated with definitive RT completed in 03/2022.   Patient continues to do well without symptoms.  She is NED on exam although her exam is limited by prior radiation and body habitus.  Recent MRI showed some uterine changes that could represent persistent malignancy versus treatment effect.  We have gotten paper records from [name] but are waiting on imaging.  Once we have pre and posttreatment imaging from [name], can compare this to her recent MRI which will help [place] hopefully establish whether changes seen are related solely to treatment effect.  Will call her once we have obtained the images themselves.   We will continue with surveillance visits every 3 months.  We discussed signs and symptoms that would be concerning for cancer recurrence, and I stressed the importance of calling if she develops any of these.  22 minutes of total time was spent for this patient encounter, including preparation, face-to-face counseling with the patient and coordination of care, and documentation of the encounter.  Jeral Pinch, MD  Division of Gynecologic Oncology  Department of Obstetrics and Gynecology  [name]

## 2022-12-28 NOTE — Patient Instructions (Signed)
It was good to see you today.  I do not see or feel any evidence of cancer recurrence on your exam.  I will see you for follow-up in 3 months.  As always, if you develop any new and concerning symptoms before your next visit, please call to see me sooner.  

## 2023-01-16 ENCOUNTER — Other Ambulatory Visit (HOSPITAL_COMMUNITY): Payer: Self-pay | Admitting: Gynecologic Oncology

## 2023-01-16 ENCOUNTER — Inpatient Hospital Stay
Admission: RE | Admit: 2023-01-16 | Discharge: 2023-01-16 | Disposition: A | Discharge status: Home / Self Care | Payer: Self-pay | Source: Ambulatory Visit | Attending: Gynecologic Oncology | Admitting: Gynecologic Oncology

## 2023-01-16 DIAGNOSIS — C801 Malignant (primary) neoplasm, unspecified: Secondary | ICD-10-CM

## 2023-01-22 ENCOUNTER — Other Ambulatory Visit: Payer: Self-pay | Admitting: Gynecologic Oncology

## 2023-01-22 DIAGNOSIS — C541 Malignant neoplasm of endometrium: Secondary | ICD-10-CM

## 2023-01-22 NOTE — Progress Notes (Signed)
Spoke with the patient about review of prior imaging pre and posttreatment in [name].  There is been some continued change since her June MRI, but this was less than 2 months after she finished radiation treatment.  We will plan for close interval MRI 3 months after her December scan.  If there is change from December, will likely need attempted endometrial sampling in the OR to rule out persistent or new disease.  Jeral Pinch MD Gynecologic Oncology

## 2023-01-28 DIAGNOSIS — E785 Hyperlipidemia, unspecified: Secondary | ICD-10-CM | POA: Diagnosis not present

## 2023-01-28 DIAGNOSIS — R600 Localized edema: Secondary | ICD-10-CM | POA: Diagnosis not present

## 2023-01-28 DIAGNOSIS — N1831 Chronic kidney disease, stage 3a: Secondary | ICD-10-CM | POA: Diagnosis not present

## 2023-01-28 DIAGNOSIS — Z794 Long term (current) use of insulin: Secondary | ICD-10-CM | POA: Diagnosis not present

## 2023-01-28 DIAGNOSIS — E1122 Type 2 diabetes mellitus with diabetic chronic kidney disease: Secondary | ICD-10-CM | POA: Diagnosis not present

## 2023-01-29 ENCOUNTER — Telehealth: Payer: Self-pay | Admitting: *Deleted

## 2023-01-29 NOTE — Telephone Encounter (Signed)
Per Dr Berline Lopes patient scheduled for a MRI on 3/4, patient aware

## 2023-01-31 DIAGNOSIS — D2261 Melanocytic nevi of right upper limb, including shoulder: Secondary | ICD-10-CM | POA: Diagnosis not present

## 2023-01-31 DIAGNOSIS — D0439 Carcinoma in situ of skin of other parts of face: Secondary | ICD-10-CM | POA: Diagnosis not present

## 2023-01-31 DIAGNOSIS — D485 Neoplasm of uncertain behavior of skin: Secondary | ICD-10-CM | POA: Diagnosis not present

## 2023-01-31 DIAGNOSIS — L570 Actinic keratosis: Secondary | ICD-10-CM | POA: Diagnosis not present

## 2023-01-31 DIAGNOSIS — D1801 Hemangioma of skin and subcutaneous tissue: Secondary | ICD-10-CM | POA: Diagnosis not present

## 2023-01-31 DIAGNOSIS — L821 Other seborrheic keratosis: Secondary | ICD-10-CM | POA: Diagnosis not present

## 2023-01-31 DIAGNOSIS — D225 Melanocytic nevi of trunk: Secondary | ICD-10-CM | POA: Diagnosis not present

## 2023-02-04 ENCOUNTER — Ambulatory Visit (INDEPENDENT_AMBULATORY_CARE_PROVIDER_SITE_OTHER): Payer: Medicare Other | Admitting: Nurse Practitioner

## 2023-02-04 ENCOUNTER — Encounter: Payer: Self-pay | Admitting: Nurse Practitioner

## 2023-02-04 VITALS — BP 110/70 | HR 53 | Temp 96.9°F | Resp 18 | Ht 64.0 in | Wt 296.6 lb

## 2023-02-04 DIAGNOSIS — M25512 Pain in left shoulder: Secondary | ICD-10-CM | POA: Diagnosis not present

## 2023-02-04 DIAGNOSIS — I10 Essential (primary) hypertension: Secondary | ICD-10-CM | POA: Diagnosis not present

## 2023-02-04 DIAGNOSIS — M1A9XX0 Chronic gout, unspecified, without tophus (tophi): Secondary | ICD-10-CM

## 2023-02-04 DIAGNOSIS — N3281 Overactive bladder: Secondary | ICD-10-CM | POA: Diagnosis not present

## 2023-02-04 DIAGNOSIS — I480 Paroxysmal atrial fibrillation: Secondary | ICD-10-CM

## 2023-02-04 DIAGNOSIS — E118 Type 2 diabetes mellitus with unspecified complications: Secondary | ICD-10-CM | POA: Diagnosis not present

## 2023-02-04 DIAGNOSIS — E782 Mixed hyperlipidemia: Secondary | ICD-10-CM

## 2023-02-04 NOTE — Progress Notes (Unsigned)
Careteam: Patient Care Team: [name], [name], NP as PCP - General (Internal Medicine)  PLACE OF SERVICE:  [place] Directive information Does Patient Have a Medical Advance Directive?: No  Allergies  Allergen Reactions   Lisinopril Cough    Other reaction(s): cough, Not available   Actos [Pioglitazone]     Unknown reaction   Metformin Other (See Comments)   Metformin And Related     Gas     Chief Complaint  Patient presents with   Medical Management of Chronic Issues    3 month follow up. Patient states she has a lot of pain in left arm.     HPI: Patient is a 73 y.o. female for routine follow up.   DM- followed by Dr Buddy Duty, recent A1c at 6.4, she was having lows so insulin was reduced and now without low blood sugar.   Hx of ovarian cancer unable to do laparoscopic hysterectomy therefore chose to do D&C and IUD, continues to follow up with oncology.   Htn- controlled at this time  Chronic gout-uric acid at goal in July 2023  A fib-  no palpitations. Continues on coreg twice daily. She sees Dr Harrington Challenger   Morbid obesity- she is trying to lose weight.   She has had open sores and LE edema but this has healed- no longer going to wound care.  She has been followed by cardiology adjusting lasix, recent echo showing normal RV function.   Reports she reached out with left arm 2 days and heard a snap. Now arm is very painful. She is able to reach up but very painful.   She has dry mouth- feels like this is a side effect from medication.   Review of Systems:  Review of Systems  Constitutional:  Negative for chills, fever and weight loss.  HENT:  Negative for tinnitus.        Dry mouth   Respiratory:  Negative for cough, sputum production and shortness of breath.   Cardiovascular:  Positive for leg swelling. Negative for chest pain and palpitations.  Gastrointestinal:  Negative for abdominal pain, constipation, diarrhea and heartburn.  Genitourinary:   Negative for dysuria, frequency and urgency.  Musculoskeletal:  Positive for joint pain. Negative for back pain, falls and myalgias.  Skin: Negative.   Neurological:  Negative for dizziness and headaches.  Psychiatric/Behavioral:  Negative for depression and memory loss. The patient does not have insomnia.     Past Medical History:  Diagnosis Date   Arthritis    Carpal tunnel syndrome    bilateral, had surgery on right hand   Diabetes mellitus without complication ([place])    Diverticulitis    Endometrial cancer ([place])    GERD (gastroesophageal reflux disease)    Gout    History of iron deficiency    Hyperlipidemia    Hypertension    Skin cancer    Past Surgical History:  Procedure Laterality Date   CARPAL TUNNEL RELEASE Right    COLON SURGERY     COLONOSCOPY     x2   COLOSTOMY     COLOSTOMY REVERSAL     DILATATION & CURETTAGE/HYSTEROSCOPY WITH MYOSURE N/A 07/17/2019   Procedure: DILATATION & CURETTAGE/HYSTEROSCOPY WITH MYOSURE;  Surgeon: Louretta Shorten, MD;  Location: [facility];  Service: Gynecology;  Laterality: N/A;   OOPHORECTOMY     unilateral   PILONIDAL CYST EXCISION     ROBOTIC ASSISTED TOTAL HYSTERECTOMY WITH BILATERAL SALPINGO OOPHERECTOMY N/A 08/20/2019  Procedure: DIAGNSOTIC LAPAROSCOPY, DILATION AND CURETTAGE, IUD PLACEMENT ;  Surgeon: Everitt Amber, MD;  Location: [facility];  Service: Gynecology;  Laterality: N/A;   SKIN CANCER EXCISION     Social History:   reports that she quit smoking about 35 years ago. Her smoking use included cigarettes. She has a 66.00 pack-year smoking history. She has never used smokeless tobacco. She reports current alcohol use. She reports that she does not use drugs.  Family History  Problem Relation Age of Onset   Diabetes Sister    Skin cancer Father     Medications: Patient's Medications  New Prescriptions   No medications on file  Previous Medications   ALLOPURINOL (ZYLOPRIM) 300 MG TABLET    TAKE 1 TABLET BY  MOUTH EVERY DAY   AMLODIPINE (NORVASC) 5 MG TABLET    TAKE 1 TABLET (5 MG TOTAL) BY MOUTH DAILY.   APIXABAN (ELIQUIS) 5 MG TABS TABLET    Take 1 tablet (5 mg total) by mouth 2 (two) times daily.   ATORVASTATIN (LIPITOR) 80 MG TABLET    Take 1 tablet (80 mg total) by mouth at bedtime.   CARBOXYMETHYLCELLUL-GLYCERIN (LUBRICATING EYE DROPS OP)    Place 1 drop into both eyes daily as needed (dry eyes).   CARVEDILOL (COREG) 12.5 MG TABLET    Take 1 tablet (12.5 mg total) by mouth 2 (two) times daily with a meal.   CHOLECALCIFEROL (VITAMIN D3 PO)    Take 1 capsule by mouth every evening.    COENZYME Q10 (COQ10) 100 MG CAPS    Take 100 mg by mouth every evening.   FAMOTIDINE (PEPCID) 20 MG TABLET    Take 20 mg by mouth daily.    FUROSEMIDE (LASIX) 40 MG TABLET    Take 2 tablets (80 mg) in the morning.   GLUCOSAMINE-CHONDROITIN PO    Take 1 tablet by mouth 2 (two) times daily.   INSULIN NPH-REGULAR HUMAN (70-30) 100 UNIT/ML INJECTION    Inject into the skin daily. 60 units In the morning and 40 units in the evening   JARDIANCE 10 MG TABS TABLET       KRILL OIL PO    Take 1 capsule by mouth daily.   LOSARTAN (COZAAR) 50 MG TABLET    Take 50 mg by mouth daily.   MECLIZINE (ANTIVERT) 12.5 MG TABLET    Take 1 tablet (12.5 mg total) by mouth 3 (three) times daily as needed for dizziness.   MULTIPLE VITAMINS-MINERALS (MULTIVITAMIN WITH MINERALS) TABLET    Take 1 tablet by mouth daily.   OXYBUTYNIN (DITROPAN-XL) 10 MG 24 HR TABLET    TAKE 1 TABLET BY MOUTH EVERYDAY AT BEDTIME   POTASSIUM CHLORIDE (KLOR-CON) 10 MEQ TABLET    Take 1 tablet (10 mEq total) by mouth 2 (two) times daily.  Modified Medications   No medications on file  Discontinued Medications   LETROZOLE (FEMARA) 2.5 MG TABLET    Take 1 tablet by mouth daily.   TRAMADOL (ULTRAM) 50 MG TABLET        Physical Exam:  Vitals:   02/04/23 1257  BP: 110/70  Pulse: (!) 53  Resp: 18  Temp: (!) 96.9 F (36.1 C)  SpO2: 96%  Weight: 296 lb 9.6 oz  (134.5 kg)  Height: '5\' 4"'$  (1.626 m)   Body mass index is 50.91 kg/m. Wt Readings from Last 3 Encounters:  02/04/23 296 lb 9.6 oz (134.5 kg)  12/28/22 (!) 309 lb (140.2 kg)  11/26/22 (!) 301  lb (136.5 kg)    Physical Exam***  Labs reviewed: Basic Metabolic Panel: Recent Labs    07/23/22 0000 08/07/22 1103 09/10/22 1601 09/27/22 1205 11/01/22 1126 11/26/22 1128 12/19/22 1354  NA 144   < > 142   < > 139 141 138  K 4.9   < > 4.4   < > 4.9 4.3 4.6  CL 107   < > 103   < > 99 104 100  CO2 31*   < > 22   < > 28 19* 23  GLUCOSE  --    < > 169*   < > 207* 147* 133*  BUN 19   < > 14   < > 31* 38* 33*  CREATININE 0.9   < > 0.99   < > 1.37* 1.35* 1.29*  CALCIUM 9.7   < > 9.6   < > 10.0 9.9 10.0  TSH 6.84*  --  6.020*  --   --   --   --    < > = values in this interval not displayed.   Liver Function Tests: Recent Labs    07/23/22 0000  AST 18  ALT 18  ALBUMIN 3.6   No results for input(s): "LIPASE", "AMYLASE" in the last 8760 hours. No results for input(s): "AMMONIA" in the last 8760 hours. CBC: Recent Labs    07/23/22 0000  WBC 6.0  HGB 9.8*  HCT 30*  PLT 332   Lipid Panel: Recent Labs    07/23/22 0000  CHOL 187  HDL 31*  LDLCALC 124  TRIG 177*   TSH: Recent Labs    07/23/22 0000 09/10/22 1601  TSH 6.84* 6.020*   A1C: Lab Results  Component Value Date   HGBA1C 5.6 07/23/2022     Assessment/Plan 1. Primary hypertension *** - COMPLETE METABOLIC PANEL WITH GFR - CBC with Differential/Platelet  2. PAF (paroxysmal atrial fibrillation) (HCC) *** - COMPLETE METABOLIC PANEL WITH GFR - CBC with Differential/Platelet  3. Chronic gout without tophus, unspecified cause, unspecified site ***  4. OAB (overactive bladder) ***  5. Mixed hyperlipidemia *** - COMPLETE METABOLIC PANEL WITH GFR  6. Diabetes mellitus with complication (HCC) ***  7. Morbid (severe) obesity due to excess calories (HCC) ***  8. Acute pain of left  shoulder ***   No follow-ups on file.: ***  Skye Rodarte K. [place], [facility] [phone]

## 2023-02-04 NOTE — Patient Instructions (Signed)
Stop oxybutynin- this is causing your dry mouth If you have uncontrolled urinary symptoms call us  Ice shoulder- if pain worsens or does not improve after 1 week, worsening weakness to notify.

## 2023-02-05 LAB — COMPLETE METABOLIC PANEL WITH GFR
AG Ratio: 1.7 (calc) (ref 1.0–2.5)
ALT: 10 U/L (ref 6–29)
AST: 15 U/L (ref 10–35)
Albumin: 4.1 g/dL (ref 3.6–5.1)
Alkaline phosphatase (APISO): 110 U/L (ref 37–153)
BUN/Creatinine Ratio: 26 (calc) — ABNORMAL HIGH (ref 6–22)
BUN: 31 mg/dL — ABNORMAL HIGH (ref 7–25)
CO2: 23 mmol/L (ref 20–32)
Calcium: 10 mg/dL (ref 8.6–10.4)
Chloride: 107 mmol/L (ref 98–110)
Creat: 1.19 mg/dL — ABNORMAL HIGH (ref 0.60–1.00)
Globulin: 2.4 g/dL (calc) (ref 1.9–3.7)
Glucose, Bld: 64 mg/dL — ABNORMAL LOW (ref 65–139)
Potassium: 4.2 mmol/L (ref 3.5–5.3)
Sodium: 142 mmol/L (ref 135–146)
Total Bilirubin: 0.4 mg/dL (ref 0.2–1.2)
Total Protein: 6.5 g/dL (ref 6.1–8.1)
eGFR: 48 mL/min/{1.73_m2} — ABNORMAL LOW (ref >=60)

## 2023-02-05 LAB — CBC WITH DIFFERENTIAL/PLATELET
Absolute Monocytes: 431 cells/uL (ref 200–950)
Basophils Absolute: 22 cells/uL (ref 0–200)
Basophils Relative: 0.5 %
Eosinophils Absolute: 150 cells/uL (ref 15–500)
Eosinophils Relative: 3.4 %
HCT: 37.4 % (ref 35.0–45.0)
Hemoglobin: 12.5 g/dL (ref 11.7–15.5)
Lymphs Abs: 634 cells/uL — ABNORMAL LOW (ref 850–3900)
MCH: 31.3 pg (ref 27.0–33.0)
MCHC: 33.4 g/dL (ref 32.0–36.0)
MCV: 93.5 fL (ref 80.0–100.0)
MPV: 9.6 fL (ref 7.5–12.5)
Monocytes Relative: 9.8 %
Neutro Abs: 3164 cells/uL (ref 1500–7800)
Neutrophils Relative %: 71.9 %
Platelets: 236 10*3/uL (ref 140–400)
RBC: 4 10*6/uL (ref 3.80–5.10)
RDW: 15.4 % — ABNORMAL HIGH (ref 11.0–15.0)
Total Lymphocyte: 14.4 %
WBC: 4.4 10*3/uL (ref 3.8–10.8)

## 2023-02-07 ENCOUNTER — Encounter: Payer: Self-pay | Admitting: Gynecologic Oncology

## 2023-02-11 ENCOUNTER — Encounter: Payer: Self-pay | Admitting: Podiatry

## 2023-02-11 ENCOUNTER — Ambulatory Visit (INDEPENDENT_AMBULATORY_CARE_PROVIDER_SITE_OTHER): Payer: Medicare Other | Admitting: Podiatry

## 2023-02-11 DIAGNOSIS — B351 Tinea unguium: Secondary | ICD-10-CM | POA: Diagnosis not present

## 2023-02-11 DIAGNOSIS — E118 Type 2 diabetes mellitus with unspecified complications: Secondary | ICD-10-CM | POA: Diagnosis not present

## 2023-02-11 DIAGNOSIS — M79675 Pain in left toe(s): Secondary | ICD-10-CM

## 2023-02-11 DIAGNOSIS — M79674 Pain in right toe(s): Secondary | ICD-10-CM

## 2023-02-11 NOTE — Progress Notes (Signed)
This patient returns to my office for at risk foot care.  This patient requires this care by a professional since this patient will be at risk due to having diabetes.  She is wearing bandages on her lower legs.  This patient is unable to cut nails herself since the patient cannot reach her nails.These nails are painful walking and wearing shoes.  This patient presents for at risk foot care today.  General Appearance  Alert, conversant and in no acute stress.  Vascular  Dorsalis pedis and posterior tibial  pulses are palpable  bilaterally.  Capillary return is within normal limits  bilaterally. Temperature is within normal limits  bilaterally.  Neurologic  Senn-Weinstein monofilament wire test within normal limits  bilaterally. Muscle power within normal limits bilaterally.  Nails Thick disfigured discolored nails with subungual debris  from hallux to fifth toes bilaterally. No evidence of bacterial infection or drainage bilaterally.  Orthopedic  No limitations of motion  feet .  No crepitus or effusions noted.  No bony pathology or digital deformities noted.  Skin  normotropic skin with no porokeratosis noted bilaterally.  No signs of infections or ulcers noted.     Onychomycosis  Pain in right toes  Pain in left toes  Consent was obtained for treatment procedures.   Mechanical debridement of nails 1-5  bilaterally performed with a nail nipper.  Filed with dremel without incident.    Return office visit      3 months                Told patient to return for periodic foot care and evaluation due to potential at risk complications.   Gardiner Barefoot DPM

## 2023-03-04 ENCOUNTER — Ambulatory Visit (HOSPITAL_COMMUNITY): Admission: RE | Admit: 2023-03-04 | Payer: Medicare Other | Source: Ambulatory Visit

## 2023-03-05 DIAGNOSIS — D225 Melanocytic nevi of trunk: Secondary | ICD-10-CM | POA: Diagnosis not present

## 2023-03-05 DIAGNOSIS — D485 Neoplasm of uncertain behavior of skin: Secondary | ICD-10-CM | POA: Diagnosis not present

## 2023-03-05 DIAGNOSIS — L988 Other specified disorders of the skin and subcutaneous tissue: Secondary | ICD-10-CM | POA: Diagnosis not present

## 2023-03-09 ENCOUNTER — Ambulatory Visit (HOSPITAL_COMMUNITY)
Admission: RE | Admit: 2023-03-09 | Discharge: 2023-03-09 | Disposition: A | Discharge status: Home / Self Care | Payer: Medicare Other | Source: Ambulatory Visit | Attending: Gynecologic Oncology | Admitting: Gynecologic Oncology

## 2023-03-09 DIAGNOSIS — C541 Malignant neoplasm of endometrium: Secondary | ICD-10-CM | POA: Insufficient documentation

## 2023-03-09 DIAGNOSIS — N3289 Other specified disorders of bladder: Secondary | ICD-10-CM | POA: Diagnosis not present

## 2023-03-09 MED ORDER — GADOBUTROL 1 MMOL/ML IV SOLN
10.0000 mL | Freq: Once | INTRAVENOUS | Status: AC | PRN
  Administered 2023-03-09: 10 mL via INTRAVENOUS

## 2023-03-11 ENCOUNTER — Encounter: Payer: Medicare Other | Admitting: Adult Health

## 2023-03-11 ENCOUNTER — Telehealth: Payer: Self-pay

## 2023-03-11 ENCOUNTER — Encounter: Payer: Medicare Other | Admitting: Family

## 2023-03-11 NOTE — Telephone Encounter (Signed)
-----   Message from Dorothyann Gibbs, NP sent at 03/11/2023  8:24 AM EDT ----- Please let the patient know her MRI is back. The radiologist compared this with her last in December. He states the scan and findings as unchanged from her last. No change in the appearance of the uterus. He feels the findings are related to chronic post-treatment/post radiation changes.   Please let her know Dr. Berline Lopes will review as well when she returns later in the week and she will be notified of any further recommendations moving forward if any.

## 2023-03-11 NOTE — Telephone Encounter (Signed)
Pt is aware of MRI results. Aware DR. Berline Lopes will review them, and if any follow up is needed, we will call her at that time. She voiced an understanding

## 2023-03-18 NOTE — Progress Notes (Signed)
  This encounter was created in error - please disregard. No show 

## 2023-03-28 NOTE — Progress Notes (Signed)
Gynecologic Oncology Return Clinic Visit  03/29/23  Reason for Visit: Surveillance visit in the setting of endometrial cancer treated initially with progesterone, most recently with definitive RT   Treatment History: Patient reports a history of postmenopausal bleeding since July 2020.  Is been persistent low low level bleeding, spotting but no heavy passage of clots.  She reported this to her gynecologist who ordered a transvaginal ultrasound on June 24, 2019.  This revealed a uterus measuring 6.7 x 3.4 x 5.5 cm with an endometrial thickness of 1.4 cm.  The ovaries were not visualized.   She was taken to the operating room for a curettage and polypectomy on July 17, 2019 which revealed FIGO grade 1 endometrioid adenocarcinoma.   Patient has a complex medical history.  She is morbidly obese with a BMI of 48 kg/m.  She experienced ruptured diverticulitis in 1997 which required a diverting colostomy and Hartman's pouch.  She 6 weeks later she was taken to the operating room for laparotomy and reversal of Hartman's.  She reports that the surgeon described her abdomen is looking like a gallon of superglue had been poured inside her.  It was an extensively long surgery.  She is unsure if additional bowel resections were necessary.  She had a 1 month hospitalization following this reversal of colostomy.  She developed a bowel obstruction during that hospitalization and required reoperation to resolve this.   In 2000 she developed a growth on an ovary (she cannot remember which side).  For this she underwent laparotomy and oophorectomy.  This surgery was performed in [place].  She was planning to have a hysterectomy at that procedure, however the surgeon aborted plans for hysterectomy and performed only oophorectomy after encountering significant adhesive disease.  She states that he told her surgery would have needed to the last 6 to 8 hours in order to perform hysterectomy due to the adhesive disease.    The patient has type 2 diabetes mellitus and has had this for greater than 30 years.  She reports having no endorgan disease.  Her hemoglobin A1c was 5.4% in May 2020.   Her family history is significant for a father with skin cancer.  She has 3 sisters with cancer including one with endometrial cancer who is obese, a sister with stage II 3 breast cancer, and multiple myeloma.   She has had 2 prior vaginal deliveries.   She lives in [place].   On August 20, 2019 she underwent an attempt at a robotic assisted total hysterectomy BSO and sentinel lymph node biopsy.  The surgery was aborted after laparoscopic entry into the left upper quadrant encountered dense adhesions throughout the entirety of the abdomen which included dense colonic and small bowel adhesions to the anterior abdominal wall.  The density of these adhesions and their comprehensive nature across the entirety of her abdominal wall meant that it was not possible to place additional laparoscopic ports to perform adhesiolysis.  She would have required a laparotomy in order to achieve hysterectomy, and the patient had declined this preoperatively, and had elected to proceed with D&C with progestin releasing IUD placement if hysterectomy was not possible via minimally invasive surgical route.  Therefore we aborted the laparoscopic procedure and performed a D&C procedure with placement of a progestin releasing IUD.   Final pathology from the [facility] specimen showed adenocarcinoma favor endometrial primary.  It was difficult to grade the specimen due to his fragmented nature.   Repeat biopsy (office) sampling in December, 2020 showed  complete clinical response with no residual carcinoma.    The patient then transferred care to Dr. Sheilah Mins in [name].  Repeat endometrial biopsy on 03/23/2021 showed residual endometrioid adenocarcinoma.   Repeat biopsy on 06/22/2021 showed FIGO grade 2 endometrioid adenocarcinoma.  There is a comment that her  Megace, which must of been started prior to this, was increased to 160 mg twice daily.   Endometrial biopsy on 09/22/2021 showed residual endometrioid adenocarcinoma, FIGO grade 1-2, in the background of endometrium with exogenous hormone effect.   Pelvic MRI on 12/05/2021 noted a 3.3 cm simple left ovarian cyst, colonic diverticulosis, anterior endometrial mass in the uterine body with an IUD adjacent, suspicious for endometrial cancer.   Repeat endometrial biopsy on 02/01/2022 showed residual endometrial carcinoma, FIGO grade 1.   Staging CT of the chest, abdomen, and pelvis on 02/15/2022 revealed no adenopathy, no aggressive appearing osseous lesions.  Diastases recti with superimposed shallow ventral wall hernia measuring 1 x 7 cm.  Cholelithiasis noted without acute cholecystitis.   03/27/22: IUD removed.   Patient proceeded with definitive radiation therapy from 03/06/2022 - 04/18/2022.  She was treated with EBRT (4500 cGy in 25 fractions) utilizing IMRT treatment technique.  This was followed by weekly T&O HDR treatment to a total dose of 3000 cGy in 5 fractions.  Total treatment dose was 7500 cGy.  Treatment was tolerated well other than fatigue and mild loose stools.   Posttreatment pelvic MRI on 06/12/2022 showed a slightly less bulky uterus.  Significant decrease in the endometrium compared to previous exam with endometrium now measuring 4 mm in thickness (previously was 2.7 cm).  Slight heterogenous signal in the endometrium.  No adenopathy.   She was last seen for follow-up on 06/20/2022 with radiation oncology.  She was doing well at that time without any vaginal bleeding or cramping.  MRI pelvis on 12/10/2022: Loss of normal differentiation between endometrium and zonal anatomy in general at the fundus and mid uterus.  Diffusion properties in this area are nondescript with no focal area of marked restricted diffusion.  Imaging findings are nonspecific although could reflect baseline treatment  changes.  Small volume ascites may be reactive.  Report was addended after prior imaging was obtained.  Loss of defined endometrium could be seen following radiotherapy.  Imaging findings remain more suggestive of posttreatment related changes from radiation but short interval follow-up suggested.  MRI pelvis on 03/09/2023 reveals unchanged appearance of the uterus, with diffuse loss of endometrial definition and mild T2 hyperintensity of the myometrium.  Normal appearance of the cervix and endocervical canal.  Unchanged left ovarian cyst measuring up to 3.9 cm, similar in size dating back to 2017 with no further follow-up recommended.  Unchanged small volume ascites.  Thought to most likely reflect chronic, posttreatment/postradiation appearance of the endometrium and uterus.  No interval change or other suspicious findings to suggest recurrence or residual disease.  Interval History: Patient reports doing well.  She denies any vaginal bleeding or discharge.  She denies any pelvic or abdominal pain.  She notes continued urinary incontinence which she describes mostly as urge and urgency incontinence.  Notes that this is quite bothersome and makes her not want to leave the house frequently.  She endorses normal bowel function.  Past Medical/Surgical History: Past Medical History:  Diagnosis Date   Arthritis    Carpal tunnel syndrome    bilateral, had surgery on right hand   Diabetes mellitus without complication ([place])    Diverticulitis    Endometrial  cancer ([place])    GERD (gastroesophageal reflux disease)    Gout    History of iron deficiency    Hyperlipidemia    Hypertension    Skin cancer     Past Surgical History:  Procedure Laterality Date   CARPAL TUNNEL RELEASE Right    COLON SURGERY     COLONOSCOPY     x2   COLOSTOMY     COLOSTOMY REVERSAL     DILATATION & CURETTAGE/HYSTEROSCOPY WITH MYOSURE N/A 07/17/2019   Procedure: DILATATION & CURETTAGE/HYSTEROSCOPY WITH MYOSURE;  Surgeon:  Louretta Shorten, MD;  Location: [facility];  Service: Gynecology;  Laterality: N/A;   OOPHORECTOMY     unilateral   PILONIDAL CYST EXCISION     ROBOTIC ASSISTED TOTAL HYSTERECTOMY WITH BILATERAL SALPINGO OOPHERECTOMY N/A 08/20/2019   Procedure: DIAGNSOTIC LAPAROSCOPY, DILATION AND CURETTAGE, IUD PLACEMENT ;  Surgeon: Everitt Amber, MD;  Location: [facility];  Service: Gynecology;  Laterality: N/A;   SKIN CANCER EXCISION      Family History  Problem Relation Age of Onset   Diabetes Sister    Skin cancer Father     Social History   Socioeconomic History   Marital status: Married    Spouse name: Not on file   Number of children: Not on file   Years of education: Not on file   Highest education level: Not on file  Occupational History   Not on file  Tobacco Use   Smoking status: Former    Packs/day: 3.00    Years: 22.00    Additional pack years: 0.00    Total pack years: 66.00    Types: Cigarettes    Quit date: 06/08/1987    Years since quitting: 35.8   Smokeless tobacco: Never  Vaping Use   Vaping Use: Never used  Substance and Sexual Activity   Alcohol use: Yes    Comment: twice per week   Drug use: No   Sexual activity: Yes    Birth control/protection: Post-menopausal  Other Topics Concern   Not on file  Social History Narrative   Not on file   Social Determinants of Health   Financial Resource Strain: Not on file  Food Insecurity: Not on file  Transportation Needs: Not on file  Physical Activity: Not on file  Stress: Not on file  Social Connections: Not on file    Current Medications:  Current Outpatient Medications:    allopurinol (ZYLOPRIM) 300 MG tablet, TAKE 1 TABLET BY MOUTH EVERY DAY, Disp: 90 tablet, Rfl: 1   amLODipine (NORVASC) 5 MG tablet, TAKE 1 TABLET (5 MG TOTAL) BY MOUTH DAILY., Disp: 90 tablet, Rfl: 1   apixaban (ELIQUIS) 5 MG TABS tablet, Take 1 tablet (5 mg total) by mouth 2 (two) times daily., Disp: 180 tablet, Rfl: 3   atorvastatin  (LIPITOR) 80 MG tablet, Take 1 tablet (80 mg total) by mouth at bedtime., Disp: 90 tablet, Rfl: 3   Carboxymethylcellul-Glycerin (LUBRICATING EYE DROPS OP), Place 1 drop into both eyes daily as needed (dry eyes)., Disp: , Rfl:    carvedilol (COREG) 12.5 MG tablet, Take 1 tablet (12.5 mg total) by mouth 2 (two) times daily with a meal., Disp: 180 tablet, Rfl: 3   Cholecalciferol (VITAMIN D3 PO), Take 1 capsule by mouth every evening. , Disp: , Rfl:    Coenzyme Q10 (COQ10) 100 MG CAPS, Take 100 mg by mouth every evening., Disp: , Rfl:    famotidine (PEPCID) 20 MG tablet, Take 20 mg by  mouth daily. , Disp: , Rfl:    furosemide (LASIX) 40 MG tablet, Take 2 tablets (80 mg) in the morning., Disp: 270 tablet, Rfl: 3   GLUCOSAMINE-CHONDROITIN PO, Take 1 tablet by mouth 2 (two) times daily., Disp: , Rfl:    insulin NPH-regular Human (70-30) 100 UNIT/ML injection, Inject into the skin daily. 60 units In the morning and 40 units in the evening, Disp: , Rfl:    JARDIANCE 10 MG TABS tablet, , Disp: , Rfl:    KRILL OIL PO, Take 1 capsule by mouth daily., Disp: , Rfl:    losartan (COZAAR) 50 MG tablet, Take 50 mg by mouth daily., Disp: , Rfl:    meclizine (ANTIVERT) 12.5 MG tablet, Take 1 tablet (12.5 mg total) by mouth 3 (three) times daily as needed for dizziness., Disp: 30 tablet, Rfl: 0   Multiple Vitamins-Minerals (MULTIVITAMIN WITH MINERALS) tablet, Take 1 tablet by mouth daily., Disp: , Rfl:    potassium chloride (KLOR-CON) 10 MEQ tablet, Take 1 tablet (10 mEq total) by mouth 2 (two) times daily., Disp: 180 tablet, Rfl: 3  Review of Systems: + incontinence Denies appetite changes, fevers, chills, fatigue, unexplained weight changes. Denies hearing loss, neck lumps or masses, mouth sores, ringing in ears or voice changes. Denies cough or wheezing.  Denies shortness of breath. Denies chest pain or palpitations. Denies leg swelling. Denies abdominal distention, pain, blood in stools, constipation,  diarrhea, nausea, vomiting, or early satiety. Denies pain with intercourse, dysuria, frequency, hematuria. Denies hot flashes, pelvic pain, vaginal bleeding or vaginal discharge.   Denies joint pain, back pain or muscle pain/cramps. Denies itching, rash, or wounds. Denies dizziness, headaches, numbness or seizures. Denies swollen lymph nodes or glands, denies easy bruising or bleeding. Denies anxiety, depression, confusion, or decreased concentration.  Physical Exam: BP 131/73 (BP Location: Left Arm, Patient Position: Sitting)   Pulse 80   Temp 98.4 F (36.9 C) (Oral)   Resp 16   Ht 5\' 4"  (1.626 m)   Wt (!) 303 lb (137.4 kg)   SpO2 99%   BMI 52.01 kg/m  General: Alert, oriented, no acute distress. HEENT: Normocephalic, atraumatic, sclera anicteric. Chest: Somewhat limited by body habitus but clear to auscultation bilaterally.   Cardiovascular: regular rate, regular rhythm, no murmurs. Abdomen: Obese, soft, nontender.  Normoactive bowel sounds.  No masses or hepatosplenomegaly appreciated.  Subcutaneous edema in pannus. Extremities: Somewhat limited range of motion.  Warm, well perfused.  Compression wraps on both lower extremities with minimal edema in bilateral feet.   Lymphatics: No cervical, supraclavicular, or inguinal adenopathy. GU: Normal appearing external genitalia without erythema, excoriation, or lesions.  Speculum exam reveals moderately atrophic vaginal mucosa.  Cervix is somewhat difficult to visualize and rather flush with the vaginal apex, atrophic.  No bleeding or discharge.  Bimanual exam reveals no masses or nodularity, cervix flush with the vagina.  Uterine assessment is limited by body habitus.  Rectovaginal exam confirms findings, no parametrial nodularity.  Laboratory & Radiologic Studies: See above MRI  Assessment & Plan: [name] is a 73 y.o. woman with clinical stage I grade 1 endometrioid endometrial cancer, non-operative candidate s/p D&C and IUD  (progestin releasing) on 08/20/19. Complete clinical response to progestin therapy with negative repeat sampling in December, 2020. Developed recurrence based on biopsy in 02/2021. She was started on Megace which was increased in 05/2022 when biopsy showed FIGO grade 2 endometrial cancer. Given persistent malignancy on biopsy, IUD removed 3/023 and patietn treated with definitive RT completed  in 03/2022.    Patient continues to do well without symptoms.  She is NED on exam although her exam is limited by prior radiation and body habitus.   We reviewed recent MRI findings, no significant change noted from to prior studies.  We will plan on repeat MRI in 4-6 months given inability to follow her clinically with exam.   We will continue with surveillance visits every 3 months.  We discussed signs and symptoms that would be concerning for cancer recurrence, and I stressed the importance of calling if she develops any of these.  Given increased symptoms related to urgency and urge urinary incontinence, I offered referral to urology.  My office will work on sending referral to [facility].  20 minutes of total time was spent for this patient encounter, including preparation, face-to-face counseling with the patient and coordination of care, and documentation of the encounter.  Jeral Pinch, MD  Division of Gynecologic Oncology  Department of Obstetrics and Gynecology  [facility]

## 2023-03-29 ENCOUNTER — Other Ambulatory Visit: Payer: Self-pay

## 2023-03-29 ENCOUNTER — Encounter: Payer: Self-pay | Admitting: Gynecologic Oncology

## 2023-03-29 ENCOUNTER — Inpatient Hospital Stay: Payer: Medicare Other | Attending: Gynecologic Oncology | Admitting: Gynecologic Oncology

## 2023-03-29 VITALS — BP 131/73 | HR 80 | Temp 98.4°F | Resp 16 | Ht 64.0 in | Wt 303.0 lb

## 2023-03-29 DIAGNOSIS — Z90722 Acquired absence of ovaries, bilateral: Secondary | ICD-10-CM | POA: Diagnosis not present

## 2023-03-29 DIAGNOSIS — Z90710 Acquired absence of both cervix and uterus: Secondary | ICD-10-CM | POA: Insufficient documentation

## 2023-03-29 DIAGNOSIS — Z79818 Long term (current) use of other agents affecting estrogen receptors and estrogen levels: Secondary | ICD-10-CM | POA: Diagnosis not present

## 2023-03-29 DIAGNOSIS — C541 Malignant neoplasm of endometrium: Secondary | ICD-10-CM | POA: Diagnosis not present

## 2023-03-29 DIAGNOSIS — N3941 Urge incontinence: Secondary | ICD-10-CM | POA: Diagnosis not present

## 2023-03-29 DIAGNOSIS — Z923 Personal history of irradiation: Secondary | ICD-10-CM | POA: Diagnosis not present

## 2023-03-29 NOTE — Patient Instructions (Signed)
It was good to see you today.  I do not see or feel any evidence of cancer recurrence on your exam.  I will see you for follow-up in 3 months.  We will get you scheduled for your next MRI about 5 months after your last one.  As always, if you develop any new and concerning symptoms before your next visit, please call to see me sooner.

## 2023-04-01 ENCOUNTER — Telehealth: Payer: Self-pay | Admitting: *Deleted

## 2023-04-01 NOTE — Telephone Encounter (Signed)
Per Dr Berline Lopes fax records and referral from to [facility]

## 2023-04-12 ENCOUNTER — Telehealth: Payer: Self-pay | Admitting: *Deleted

## 2023-04-12 NOTE — Telephone Encounter (Signed)
Received fax from [facility] that the patient declined the appt

## 2023-04-15 NOTE — Telephone Encounter (Signed)
Thank you :)

## 2023-04-26 ENCOUNTER — Ambulatory Visit (HOSPITAL_COMMUNITY): Payer: Medicare Other

## 2023-05-06 ENCOUNTER — Ambulatory Visit: Payer: Medicare Other | Admitting: Adult Health

## 2023-05-07 ENCOUNTER — Other Ambulatory Visit: Payer: Self-pay

## 2023-05-09 ENCOUNTER — Ambulatory Visit (INDEPENDENT_AMBULATORY_CARE_PROVIDER_SITE_OTHER): Payer: Medicare Other

## 2023-05-09 ENCOUNTER — Encounter: Payer: Self-pay | Admitting: Family Medicine

## 2023-05-09 ENCOUNTER — Ambulatory Visit (INDEPENDENT_AMBULATORY_CARE_PROVIDER_SITE_OTHER): Payer: Medicare Other | Admitting: Family Medicine

## 2023-05-09 VITALS — BP 130/70 | HR 73 | Wt 305.6 lb

## 2023-05-09 VITALS — BP 130/70 | HR 73 | Ht 65.0 in | Wt 305.6 lb

## 2023-05-09 DIAGNOSIS — N1831 Chronic kidney disease, stage 3a: Secondary | ICD-10-CM

## 2023-05-09 DIAGNOSIS — Z0000 Encounter for general adult medical examination without abnormal findings: Secondary | ICD-10-CM | POA: Diagnosis not present

## 2023-05-09 DIAGNOSIS — N3941 Urge incontinence: Secondary | ICD-10-CM | POA: Diagnosis not present

## 2023-05-09 DIAGNOSIS — I10 Essential (primary) hypertension: Secondary | ICD-10-CM | POA: Diagnosis not present

## 2023-05-09 DIAGNOSIS — G5602 Carpal tunnel syndrome, left upper limb: Secondary | ICD-10-CM | POA: Diagnosis not present

## 2023-05-09 DIAGNOSIS — R600 Localized edema: Secondary | ICD-10-CM | POA: Diagnosis not present

## 2023-05-09 MED ORDER — GEMTESA 75 MG PO TABS: 75.0000 mg | ORAL_TABLET | Freq: Every day | ORAL | 1 refills | Status: DC

## 2023-05-09 NOTE — Progress Notes (Addendum)
Office Note 05/09/2023  CC:  Chief Complaint  Patient presents with   Establish Care    New pt. Establish care. Wants to discuss Gemtesa for incontinence. She has also been experiencing Dizziness and wants to discuss carpal tunnel surgery.   HPI:  [name] is a 73 y.o. White female who is here to establish care and discuss urinary incontinence. Patient's most recent primary MD: [facility]. Old records were reviewed prior to or during today's visit.  Has longstanding urge incontinence.  Oxybutynin did not help plus it caused too much dry mouth. Her daughter is a Teacher, early years/pre and recommended that she ask about [place]. Denies any history of recurrent UTIs. Currently no burning, no gross hematuria.  Says lower extremity swelling has been doing well lately.  No weeping.  She wraps her legs every morning. She takes 80 mg of Lasix every morning. She gets management  her diabetes by Dr. Sharl Ma and endocrinology.  She currently takes 60 units of 70/30 insulin in the morning and 40 units in the evening.  Diagnosed with persistent A-fib last year, followed by Dr. Tenny Craw.  Is on Coreg 12.5 mg twice daily and Eliquis 5 mg twice daily.  She has approximately 1 year history of progressive left hand numbness, first through third digits and thenar eminence. No wrist or fingers pain.  History of severe right carpal tunnel syndrome, history of right-sided carpal tunnel release surgery.  This helped. She is considering getting the left side done in the future.   Past Medical History:  Diagnosis Date   Atrial fibrillation (HCC)    Bilateral lower extremity edema    Carpal tunnel syndrome    bilateral, had surgery on right hand   Chronic renal insufficiency, stage 3 (moderate) (HCC)    GFR 50s   Diabetes mellitus without complication (HCC)    Dr. Sharl Ma   Diverticulosis    +hx of 'itis   Endometrial cancer (HCC)    RT   GERD (gastroesophageal reflux  disease)    Gout    Heart murmur    History of blood transfusion    History of colon polyps    History of iron deficiency    History of recurrent UTIs    Hyperlipidemia    Hypertension    Osteoarthritis, multiple sites    esp knees   Skin cancer    Urinary incontinence     Past Surgical History:  Procedure Laterality Date   CARPAL TUNNEL RELEASE Right 1991   COLON SURGERY     COLONOSCOPY     x2.  Last was 2018   COLOSTOMY     COLOSTOMY REVERSAL     DILATATION & CURETTAGE/HYSTEROSCOPY WITH MYOSURE N/A 07/17/2019   Procedure: DILATATION & CURETTAGE/HYSTEROSCOPY WITH MYOSURE;  Surgeon: Candice Camp, MD;  Location: [facility];  Service: Gynecology;  Laterality: N/A;   OOPHORECTOMY  2000   unilateral   PILONIDAL CYST EXCISION     ROBOTIC ASSISTED TOTAL HYSTERECTOMY WITH BILATERAL SALPINGO OOPHERECTOMY N/A 08/20/2019   Procedure: DIAGNSOTIC LAPAROSCOPY, DILATION AND CURETTAGE, IUD PLACEMENT ;  Surgeon: Adolphus Birchwood, MD;  Location: [facility];  Service: Gynecology;  Laterality: N/A;   SKIN CANCER EXCISION      Family History  Problem Relation Age of Onset   Hypertension Mother    Skin cancer Father    Diabetes Sister    Cancer Sister    Hypertension Sister    Cancer Sister    Diabetes  Sister    Hearing loss Sister    Hypertension Sister    Kidney disease Sister    Cancer Sister    Diabetes Brother    Diabetes Maternal Grandfather    Stroke Maternal Grandfather    Diabetes Paternal Grandmother    Hearing loss Daughter     Social History   Socioeconomic History   Marital status: Married    Spouse name: Not on file   Number of children: Not on file   Years of education: Not on file   Highest education level: Not on file  Occupational History   Not on file  Tobacco Use   Smoking status: Former    Packs/day: 3.00    Years: 22.00    Additional pack years: 0.00    Total pack years: 66.00    Types: Cigarettes    Quit date: 06/08/1987    Years since  quitting: 35.9   Smokeless tobacco: Never  Vaping Use   Vaping Use: Never used  Substance and Sexual Activity   Alcohol use: Yes    Comment: twice per week   Drug use: No   Sexual activity: Yes    Partners: Male    Birth control/protection: Post-menopausal    Comment: married  Other Topics Concern   Not on file  Social History Narrative   Not on file   Social Determinants of Health   Financial Resource Strain: Not on file  Food Insecurity: Not on file  Transportation Needs: Not on file  Physical Activity: Not on file  Stress: Not on file  Social Connections: Not on file  Intimate Partner Violence: Not on file    Outpatient Encounter Medications as of 05/09/2023  Medication Sig   Acetaminophen (TYLENOL PO) Take 1,000 mg by mouth 3 (three) times daily.   allopurinol (ZYLOPRIM) 300 MG tablet TAKE 1 TABLET BY MOUTH EVERY DAY   amLODipine (NORVASC) 5 MG tablet TAKE 1 TABLET (5 MG TOTAL) BY MOUTH DAILY.   apixaban (ELIQUIS) 5 MG TABS tablet Take 1 tablet (5 mg total) by mouth 2 (two) times daily.   atorvastatin (LIPITOR) 80 MG tablet Take 1 tablet (80 mg total) by mouth at bedtime.   carvedilol (COREG) 12.5 MG tablet Take 1 tablet (12.5 mg total) by mouth 2 (two) times daily with a meal.   Cholecalciferol (VITAMIN D3 PO) Take 1 capsule by mouth every evening.    Coenzyme Q10 (COQ10) 100 MG CAPS Take 100 mg by mouth every evening.   famotidine (PEPCID) 20 MG tablet Take 20 mg by mouth daily.    furosemide (LASIX) 40 MG tablet Take 2 tablets (80 mg) in the morning.   GLUCOSAMINE-CHONDROITIN PO Take 1 tablet by mouth 2 (two) times daily.   insulin NPH-regular Human (70-30) 100 UNIT/ML injection Inject into the skin daily. 60 units In the morning and 40 units in the evening   KRILL OIL PO Take 1 capsule by mouth daily.   losartan (COZAAR) 50 MG tablet Take 50 mg by mouth daily.   meclizine (ANTIVERT) 12.5 MG tablet Take 1 tablet (12.5 mg total) by mouth 3 (three) times daily as needed  for dizziness.   MELATONIN PO Take 10 mg by mouth at bedtime.   Multiple Vitamins-Minerals (MULTIVITAMIN WITH MINERALS) tablet Take 1 tablet by mouth daily.   potassium chloride (KLOR-CON) 10 MEQ tablet Take 1 tablet (10 mEq total) by mouth 2 (two) times daily.   Carboxymethylcellul-Glycerin (LUBRICATING EYE DROPS OP) Place 1 drop into both eyes  daily as needed (dry eyes). (Patient not taking: Reported on 05/09/2023)   No facility-administered encounter medications on file as of 05/09/2023.    Allergies  Allergen Reactions   Lisinopril Cough    Other reaction(s): cough, Not available   Actos [Pioglitazone]     Unknown reaction   Metformin Other (See Comments)   Metformin And Related     Gas     Review of Systems  Constitutional:  Negative for fatigue and fever.  HENT:  Negative for congestion and sore throat.   Eyes:  Negative for visual disturbance.  Respiratory:  Negative for cough.   Cardiovascular:  Negative for chest pain.  Gastrointestinal:  Negative for abdominal pain and nausea.  Genitourinary:  Negative for dysuria.  Musculoskeletal:  Negative for back pain and joint swelling.  Skin:  Negative for rash.  Neurological:  Negative for weakness and headaches.  Hematological:  Negative for adenopathy.    PE; Blood pressure 130/70, pulse 73, height 5\' 5"  (1.651 m), weight (!) 305 lb 9.6 oz (138.6 kg). Body mass index is 50.85 kg/m.  Physical Exam  General: Alert, sitting up in wheelchair.  Does not appear acutely ill. AFFECT: pleasant, lucid thought and speech. CV: irreg irreg, rate 70, soft systolic murmur, no diastolic murmur, no rub Chest is clear, no wheezing or rales. Normal symmetric air entry throughout both lung fields. No chest wall deformities or tenderness. EXT: no cyanosis.  She has both LL's wrapped with gauze/ACE bandage. Left wrist with full range of motion, all fingers with full range of motion.  No wrist or fingers swelling.  Tinel's and Phalen sign  negative bilaterally.  Decreased sensation palmar aspect of digits 1 through 3 on the left hand.  Mild thenar wasting on the left. Intrinsic hand muscle strength 5 out of 5 bilaterally. Wrist and finger flexion 5 out of 5 bilaterally.  Pertinent labs:  Last CBC Lab Results  Component Value Date   WBC 4.4 02/04/2023   HGB 12.5 02/04/2023   HCT 37.4 02/04/2023   MCV 93.5 02/04/2023   MCH 31.3 02/04/2023   RDW 15.4 (H) 02/04/2023   PLT 236 02/04/2023   Last metabolic panel Lab Results  Component Value Date   GLUCOSE 64 (L) 02/04/2023   NA 142 02/04/2023   K 4.2 02/04/2023   CL 107 02/04/2023   CO2 23 02/04/2023   BUN 31 (H) 02/04/2023   CREATININE 1.19 (H) 02/04/2023   EGFR 48 (L) 02/04/2023   CALCIUM 10.0 02/04/2023   PROT 6.5 02/04/2023   ALBUMIN 3.6 07/23/2022   BILITOT 0.4 02/04/2023   ALKPHOS 118 08/17/2019   AST 15 02/04/2023   ALT 10 02/04/2023   ANIONGAP 12 08/27/2019   Last lipids Lab Results  Component Value Date   CHOL 187 07/23/2022   HDL 31 (A) 07/23/2022   LDLCALC 124 07/23/2022   TRIG 177 (A) 07/23/2022   Last hemoglobin A1c Lab Results  Component Value Date   HGBA1C 5.6 07/23/2022   Last thyroid functions Lab Results  Component Value Date   TSH 6.020 (H) 09/10/2022   ASSESSMENT AND PLAN:   New patient, establishing care.  #1 urge incontinence.  Failed oxybutynin trial. Will do trial of Gemtesa 75 mg daily.  #2 hypertension, well-controlled on amlodipine 5 mg a day, losartan 50 mg a day, and Coreg 12.5 mg twice daily.  Electrolytes and creatinine today.  3.  Chronic renal insufficiency stage III. Avoid NSAIDs. Monitor electrolytes and serum creatinine today.  4.  Chronic bilateral lower extremity edema in the setting of diastolic heart disease. Stable on Lasix 80 mg daily, sodium restriction, and compression wrappings daily. Electrolytes and creatinine today.  #5 carpal tunnel syndrome, left. Progressive symptoms. Bedside MSK  ultrasound today showed median nerve area on the right was 0.12 cm and on the left was 0.18 cm.  Waist sign on the left, hypoechoic median nerve changes present on the left. She will call if she needs any assistance with specialist evaluation for this problem.  She is currently thinking about it.  An After Visit Summary was printed and given to the patient.  F/u: 3 mo  Signed:  Santiago Bumpers, MD           05/09/2023

## 2023-05-09 NOTE — Progress Notes (Signed)
Subjective:   [name] is a 73 y.o. female who presents for Medicare Annual (Subsequent) preventive examination.  Review of Systems    Defer to PCP Cardiac Risk Factors include: advanced age (>6men, >13 women);diabetes mellitus;obesity (BMI >30kg/m2);sedentary lifestyle     Objective:    Today's Vitals   05/09/23 1440 05/09/23 1445  BP:  130/70  Pulse:  73  Weight:  (!) 305 lb 9.6 oz (138.6 kg)  PainSc: 4     Body mass index is 50.85 kg/m.     05/09/2023    2:46 PM 02/04/2023   12:56 PM 07/30/2022    9:49 AM 12/11/2019    2:20 PM 08/20/2019    8:50 AM 08/17/2019   11:44 AM 07/24/2019    9:43 AM  Advanced Directives  Does Patient Have a Medical Advance Directive? No No No No No No No  Does patient want to make changes to medical advance directive?     No - Patient declined  No - Patient declined  Would patient like information on creating a medical advance directive? No - Patient declined  No - Patient declined No - Patient declined       Current Medications (verified) Outpatient Encounter Medications as of 05/09/2023  Medication Sig   Acetaminophen (TYLENOL PO) Take 1,000 mg by mouth 3 (three) times daily.   allopurinol (ZYLOPRIM) 300 MG tablet TAKE 1 TABLET BY MOUTH EVERY DAY   amLODipine (NORVASC) 5 MG tablet TAKE 1 TABLET (5 MG TOTAL) BY MOUTH DAILY.   apixaban (ELIQUIS) 5 MG TABS tablet Take 1 tablet (5 mg total) by mouth 2 (two) times daily.   atorvastatin (LIPITOR) 80 MG tablet Take 1 tablet (80 mg total) by mouth at bedtime.   Carboxymethylcellul-Glycerin (LUBRICATING EYE DROPS OP) Place 1 drop into both eyes daily as needed (dry eyes). (Patient not taking: Reported on 05/09/2023)   carvedilol (COREG) 12.5 MG tablet Take 1 tablet (12.5 mg total) by mouth 2 (two) times daily with a meal.   Cholecalciferol (VITAMIN D3 PO) Take 1 capsule by mouth every evening.    Coenzyme Q10 (COQ10) 100 MG CAPS Take 100 mg by mouth every evening.   famotidine (PEPCID) 20 MG tablet  Take 20 mg by mouth daily.    furosemide (LASIX) 40 MG tablet Take 2 tablets (80 mg) in the morning.   GLUCOSAMINE-CHONDROITIN PO Take 1 tablet by mouth 2 (two) times daily.   insulin NPH-regular Human (70-30) 100 UNIT/ML injection Inject into the skin daily. 60 units In the morning and 40 units in the evening   KRILL OIL PO Take 1 capsule by mouth daily.   losartan (COZAAR) 50 MG tablet Take 50 mg by mouth daily.   meclizine (ANTIVERT) 12.5 MG tablet Take 1 tablet (12.5 mg total) by mouth 3 (three) times daily as needed for dizziness.   MELATONIN PO Take 10 mg by mouth at bedtime.   Multiple Vitamins-Minerals (MULTIVITAMIN WITH MINERALS) tablet Take 1 tablet by mouth daily.   potassium chloride (KLOR-CON) 10 MEQ tablet Take 1 tablet (10 mEq total) by mouth 2 (two) times daily.   Vibegron (GEMTESA) 75 MG TABS Take 1 tablet (75 mg total) by mouth daily.   No facility-administered encounter medications on file as of 05/09/2023.    Allergies (verified) Lisinopril, Actos [pioglitazone], Metformin, and Metformin and related   History: Past Medical History:  Diagnosis Date   Atrial fibrillation (HCC)    Bilateral lower extremity edema    Carpal tunnel syndrome  bilateral, had surgery on right hand   Chronic renal insufficiency, stage 3 (moderate) (HCC)    GFR 50s   Diabetes mellitus without complication (HCC)    Dr. Sharl Ma   Diverticulosis    +hx of 'itis   Endometrial cancer (HCC)    RT   GERD (gastroesophageal reflux disease)    Gout    Heart murmur    History of blood transfusion    History of colon polyps    History of iron deficiency    History of recurrent UTIs    Hyperlipidemia    Hypertension    Osteoarthritis, multiple sites    esp knees   Skin cancer    Urinary incontinence    Past Surgical History:  Procedure Laterality Date   CARPAL TUNNEL RELEASE Right 1991   COLON SURGERY     COLONOSCOPY     x2.  Last was 2018   COLOSTOMY     COLOSTOMY REVERSAL      DILATATION & CURETTAGE/HYSTEROSCOPY WITH MYOSURE N/A 07/17/2019   Procedure: DILATATION & CURETTAGE/HYSTEROSCOPY WITH MYOSURE;  Surgeon: Candice Camp, MD;  Location: [facility];  Service: Gynecology;  Laterality: N/A;   OOPHORECTOMY  2000   unilateral   PILONIDAL CYST EXCISION     ROBOTIC ASSISTED TOTAL HYSTERECTOMY WITH BILATERAL SALPINGO OOPHERECTOMY N/A 08/20/2019   Procedure: DIAGNSOTIC LAPAROSCOPY, DILATION AND CURETTAGE, IUD PLACEMENT ;  Surgeon: Adolphus Birchwood, MD;  Location: [facility];  Service: Gynecology;  Laterality: N/A;   SKIN CANCER EXCISION     Family History  Problem Relation Age of Onset   Hypertension Mother    Skin cancer Father    Diabetes Sister    Cancer Sister    Hypertension Sister    Cancer Sister    Diabetes Sister    Hearing loss Sister    Hypertension Sister    Kidney disease Sister    Cancer Sister    Diabetes Brother    Diabetes Maternal Grandfather    Stroke Maternal Grandfather    Diabetes Paternal Grandmother    Hearing loss Daughter    Social History   Socioeconomic History   Marital status: Married    Spouse name: Not on file   Number of children: Not on file   Years of education: Not on file   Highest education level: Not on file  Occupational History   Not on file  Tobacco Use   Smoking status: Former    Packs/day: 3.00    Years: 22.00    Additional pack years: 0.00    Total pack years: 66.00    Types: Cigarettes    Quit date: 06/08/1987    Years since quitting: 35.9   Smokeless tobacco: Never  Vaping Use   Vaping Use: Never used  Substance and Sexual Activity   Alcohol use: Yes    Comment: twice per week   Drug use: No   Sexual activity: Yes    Partners: Male    Birth control/protection: Post-menopausal    Comment: married  Other Topics Concern   Not on file  Social History Narrative   Not on file   Social Determinants of Health   Financial Resource Strain: Low Risk  (05/09/2023)   Overall Financial Resource  Strain (CARDIA)    Difficulty of Paying Living Expenses: Not very hard  Food Insecurity: No Food Insecurity (05/09/2023)   Hunger Vital Sign    Worried About Running Out of Food in the Last Year: Never true  Ran Out of Food in the Last Year: Never true  Transportation Needs: No Transportation Needs (05/09/2023)   PRAPARE - Administrator, Civil Service (Medical): No    Lack of Transportation (Non-Medical): No  Physical Activity: Inactive (05/09/2023)   Exercise Vital Sign    Days of Exercise per Week: 0 days    Minutes of Exercise per Session: 0 min  Stress: No Stress Concern Present (05/09/2023)   [employer] of Occupational Health - Occupational Stress Questionnaire    Feeling of Stress : Only a little  Social Connections: Socially Integrated (05/09/2023)   Social Connection and Isolation Panel [NHANES]    Frequency of Communication with Friends and Family: More than three times a week    Frequency of Social Gatherings with Friends and Family: Three times a week    Attends Religious Services: More than 4 times per year    Active Member of Clubs or Organizations: No    Attends Engineer, structural: More than 4 times per year    Marital Status: Married    Tobacco Counseling Counseling given: Not Answered   Clinical Intake:  Pre-visit preparation completed: No  Pain : 0-10 Pain Score: 4      Nutritional Status: BMI > 30  Obese Nutritional Risks: None Diabetes: Yes CBG done?: No Did pt. bring in CBG monitor from home?: No  How often do you need to have someone help you when you read instructions, pamphlets, or other written materials from your doctor or pharmacy?: 1 - Never  Diabetic?yes  Interpreter Needed?: No      Activities of Daily Living    05/09/2023    2:47 PM  In your present state of health, do you have any difficulty performing the following activities:  Hearing? 0  Vision? 1  Difficulty concentrating or making decisions? 0   Walking or climbing stairs? 1  Dressing or bathing? 1  Doing errands, shopping? 0  Preparing Food and eating ? N  Using the Toilet? N  In the past six months, have you accidently leaked urine? Y  Do you have problems with loss of bowel control? N  Managing your Medications? N  Managing your Finances? N  Housekeeping or managing your Housekeeping? N    Patient Care Team: Jeoffrey Massed, MD as PCP - General (Family Medicine) Carver Fila, MD as Consulting Physician (Gynecologic Oncology) Pricilla Riffle, MD as Consulting Physician (Cardiology) [facility], [facility] Talmage Coin, MD as Consulting Physician (Endocrinology)  Indicate any recent Medical Services you may have received from other than [facility] providers in the past year (date may be approximate).     Assessment:   This is a routine wellness examination for [name].  Hearing/Vision screen No results found.  Dietary issues and exercise activities discussed: Current Exercise Habits: The patient does not participate in regular exercise at present, Exercise limited by: Other - see comments (mobility impairment)   Goals Addressed   None    Depression Screen    05/09/2023    2:42 PM 05/09/2023    1:27 PM 10/02/2022    1:02 PM  PHQ 2/9 Scores  PHQ - 2 Score 0 0 0  PHQ- 9 Score  2 0    Fall Risk    05/09/2023    2:47 PM 05/09/2023    1:27 PM 02/04/2023   12:55 PM 10/02/2022    1:02 PM 07/30/2022    9:49 AM  Fall Risk   Falls in  the past year? 0 0 0 0 0  Number falls in past yr: 0 0 0 0 0  Injury with Fall? 0 0 0 0 0  Risk for fall due to : Impaired mobility;Impaired balance/gait Impaired mobility;Impaired balance/gait No Fall Risks  No Fall Risks  Follow up Falls evaluation completed Falls evaluation completed Falls evaluation completed Falls evaluation completed Falls evaluation completed    FALL RISK PREVENTION PERTAINING TO THE HOME:  Any stairs in or around the home? Yes  If so, are there any  without handrails? No  Home free of loose throw rugs in walkways, pet beds, electrical cords, etc? Yes  Adequate lighting in your home to reduce risk of falls? Yes   ASSISTIVE DEVICES UTILIZED TO PREVENT FALLS:  Life alert? No  Use of a cane, walker or w/c? Yes  Grab bars in the bathroom? Yes  Shower chair or bench in shower? Yes  Elevated toilet seat or a handicapped toilet? Yes   TIMED UP AND GO:  Was the test performed? No . Pt in motorized wheelchair Length of time to ambulate 10 feet: 0 sec.     Cognitive Function:        05/09/2023    2:49 PM  6CIT Screen  What Year? 0 points  What month? 0 points  What time? 0 points  Count back from 20 0 points  Months in reverse 0 points  Repeat phrase 0 points  Total Score 0 points    Immunizations Immunization History  Administered Date(s) Administered   Influenza, High Dose Seasonal PF 11/14/2015, 10/22/2017, 10/06/2018, 12/09/2019   Influenza,inj,Quad PF,6+ Mos 10/19/2016   PFIZER Comirnaty(Gray Top)Covid-19 Tri-Sucrose Vaccine 05/17/2021   PFIZER(Purple Top)SARS-COV-2 Vaccination 02/09/2020, 03/01/2020, 09/27/2020   Pneumococcal Conjugate-13 06/21/2014   Pneumococcal Polysaccharide-23 07/27/2015   Tdap 06/21/2014   Zoster, Live 08/16/2014    TDAP status: Up to date  Flu Vaccine status: Up to date  Pneumococcal vaccine status: Up to date  Covid-19 vaccine status: Completed vaccines  Qualifies for Shingles Vaccine? Yes   Zostavax completed Yes   Shingrix Completed?: No.    Education has been provided regarding the importance of this vaccine. Patient has been advised to call insurance company to determine out of pocket expense if they have not yet received this vaccine. Advised may also receive vaccine at local pharmacy or Health Dept. Verbalized acceptance and understanding.  Screening Tests Health Maintenance  Topic Date Due   Diabetic kidney evaluation - Urine ACR  Never done   Hepatitis C Screening  Never  done   Zoster Vaccines- Shingrix (1 of 2) Never done   COLONOSCOPY (Pts 45-62yrs Insurance coverage will need to be confirmed)  Never done   DEXA SCAN  Never done   COVID-19 Vaccine (5 - 2023-24 season) 08/31/2022   HEMOGLOBIN A1C  01/23/2023   INFLUENZA VACCINE  08/01/2023   OPHTHALMOLOGY EXAM  08/01/2023   MAMMOGRAM  11/01/2023   FOOT EXAM  01/30/2024   Diabetic kidney evaluation - eGFR measurement  02/05/2024   Medicare Annual Wellness (AWV)  05/08/2024   DTaP/Tdap/Td (2 - Td or Tdap) 06/21/2024   Pneumonia Vaccine 90+ Years old  Completed   HPV VACCINES  Aged Out    Health Maintenance  Health Maintenance Due  Topic Date Due   Diabetic kidney evaluation - Urine ACR  Never done   Hepatitis C Screening  Never done   Zoster Vaccines- Shingrix (1 of 2) Never done   COLONOSCOPY (Pts 45-1yrs Insurance coverage  will need to be confirmed)  Never done   DEXA SCAN  Never done   COVID-19 Vaccine (5 - 2023-24 season) 08/31/2022   HEMOGLOBIN A1C  01/23/2023    Colorectal cancer screening: Type of screening: Colonoscopy. Completed 2018. Repeat every n/a years  Mammogram status: Completed 02/08/21. Repeat every year  Bone Density status: Completed 08/31/2018. Results reflect: Bone density results: NORMAL. Repeat every 2 years.  Lung Cancer Screening: (Low Dose CT Chest recommended if Age 24-80 years, 30 pack-year currently smoking OR have quit w/in 15years.) does not qualify.   Lung Cancer Screening Referral: n/a  Additional Screening:  Hepatitis C Screening: does qualify; Completed n/a  Vision Screening: Recommended annual ophthalmology exams for early detection of glaucoma and other disorders of the eye. Is the patient up to date with their annual eye exam?  Yes  Who is the provider or what is the name of the office in which the patient attends annual eye exams? [facility] If pt is not established with a provider, would they like to be referred to a provider to  establish care? No .   Dental Screening: Recommended annual dental exams for proper oral hygiene  Community Resource Referral / Chronic Care Management: CRR required this visit?  No   CCM required this visit?  No      Plan:     I have personally reviewed and noted the following in the patient's chart:   Medical and social history Use of alcohol, tobacco or illicit drugs  Current medications and supplements including opioid prescriptions. Patient is not currently taking opioid prescriptions. Functional ability and status Nutritional status Physical activity Advanced directives List of other physicians Hospitalizations, surgeries, and ER visits in previous 12 months Vitals Screenings to include cognitive, depression, and falls Referrals and appointments  In addition, I have reviewed and discussed with patient certain preventive protocols, quality metrics, and best practice recommendations. A written personalized care plan for preventive services as well as general preventive health recommendations were provided to patient.     Filomena Jungling, CMA   05/09/2023   Nurse Notes: Non-Face to Face or Face to Face 5 minute visit Encounter  Ms. Annice Needy , Thank you for taking time to come for your Medicare Wellness Visit. I appreciate your ongoing commitment to your health goals. Please review the following plan we discussed and let me know if I can assist you in the future.   These are the goals we discussed:  Goals   None     This is a list of the screening recommended for you and due dates:  Health Maintenance  Topic Date Due   Yearly kidney health urinalysis for diabetes  Never done   Hepatitis C Screening: USPSTF Recommendation to screen - Ages 2-79 yo.  Never done   Zoster (Shingles) Vaccine (1 of 2) Never done   Colon Cancer Screening  Never done   DEXA scan (bone density measurement)  Never done   COVID-19 Vaccine (5 - 2023-24 season) 08/31/2022   Hemoglobin A1C   01/23/2023   Flu Shot  08/01/2023   Eye exam for diabetics  08/01/2023   Mammogram  11/01/2023   Complete foot exam   01/30/2024   Yearly kidney function blood test for diabetes  02/05/2024   Medicare Annual Wellness Visit  05/08/2024   DTaP/Tdap/Td vaccine (2 - Td or Tdap) 06/21/2024   Pneumonia Vaccine  Completed   HPV Vaccine  Aged Out

## 2023-05-10 LAB — BASIC METABOLIC PANEL
BUN: 35 mg/dL — ABNORMAL HIGH (ref 6–23)
CO2: 29 mEq/L (ref 19–32)
Calcium: 9.6 mg/dL (ref 8.4–10.5)
Chloride: 101 mEq/L (ref 96–112)
Creatinine, Ser: 1.17 mg/dL (ref 0.40–1.20)
GFR: 45.9 mL/min — ABNORMAL LOW (ref >60.00)
Glucose, Bld: 164 mg/dL — ABNORMAL HIGH (ref 70–99)
Potassium: 4 mEq/L (ref 3.5–5.1)
Sodium: 140 mEq/L (ref 135–145)

## 2023-05-13 ENCOUNTER — Ambulatory Visit (INDEPENDENT_AMBULATORY_CARE_PROVIDER_SITE_OTHER): Payer: Medicare Other | Admitting: Podiatry

## 2023-05-13 ENCOUNTER — Encounter: Payer: Self-pay | Admitting: Podiatry

## 2023-05-13 DIAGNOSIS — E118 Type 2 diabetes mellitus with unspecified complications: Secondary | ICD-10-CM | POA: Diagnosis not present

## 2023-05-13 DIAGNOSIS — M79674 Pain in right toe(s): Secondary | ICD-10-CM | POA: Diagnosis not present

## 2023-05-13 DIAGNOSIS — M79675 Pain in left toe(s): Secondary | ICD-10-CM

## 2023-05-13 DIAGNOSIS — B351 Tinea unguium: Secondary | ICD-10-CM

## 2023-05-13 NOTE — Progress Notes (Signed)
This patient returns to my office for at risk foot care.  This patient requires this care by a professional since this patient will be at risk due to having diabetes.  She is wearing bandages on her lower legs.  This patient is unable to cut nails herself since the patient cannot reach her nails.These nails are painful walking and wearing shoes.  This patient presents for at risk foot care today.  General Appearance  Alert, conversant and in no acute stress.  Vascular  Dorsalis pedis and posterior tibial  pulses are palpable  bilaterally.  Capillary return is within normal limits  bilaterally. Temperature is within normal limits  bilaterally.  Neurologic  Senn-Weinstein monofilament wire test within normal limits  bilaterally. Muscle power within normal limits bilaterally.  Nails Thick disfigured discolored nails with subungual debris  from hallux to fifth toes bilaterally. No evidence of bacterial infection or drainage bilaterally.  Orthopedic  No limitations of motion  feet .  No crepitus or effusions noted.  No bony pathology or digital deformities noted.  Skin  normotropic skin with no porokeratosis noted bilaterally.  No signs of infections or ulcers noted.     Onychomycosis  Pain in right toes  Pain in left toes  Consent was obtained for treatment procedures.   Mechanical debridement of nails 1-5  bilaterally performed with a nail nipper.  Filed with dremel without incident.    Return office visit      10   weeks              Told patient to return for periodic foot care and evaluation due to potential at risk complications.   Helane Gunther DPM

## 2023-05-29 ENCOUNTER — Encounter: Payer: Self-pay | Admitting: Internal Medicine

## 2023-05-29 ENCOUNTER — Encounter: Payer: Self-pay | Admitting: Family Medicine

## 2023-05-29 ENCOUNTER — Other Ambulatory Visit: Payer: Self-pay | Admitting: Adult Health

## 2023-05-29 DIAGNOSIS — G5603 Carpal tunnel syndrome, bilateral upper limbs: Secondary | ICD-10-CM

## 2023-05-29 DIAGNOSIS — I10 Essential (primary) hypertension: Secondary | ICD-10-CM

## 2023-05-29 MED ORDER — LOSARTAN POTASSIUM 50 MG PO TABS: 50.0000 mg | ORAL_TABLET | Freq: Every day | ORAL | 0 refills | Status: DC

## 2023-05-29 NOTE — Telephone Encounter (Signed)
Hi Abbie, so sorry I did not get back with you about this.  I did contact the specialist I was talking about with you but he said he has not been given approval by insurance for doing the procedure yet. However, I have just ordered a referral today to an orthopedic specialist named Dr. Bradly Bienenstock in [place].  He focuses on hand and wrist surgery. You should get a call from their office to set up appointment. If you do not hear anything by this Friday then you can call their office at [phone].

## 2023-06-06 ENCOUNTER — Encounter: Payer: Self-pay | Admitting: Gynecologic Oncology

## 2023-06-06 NOTE — Progress Notes (Signed)
Gynecologic Oncology Return Clinic Visit  06/07/23  Reason for Visit: Surveillance visit in the setting of endometrial cancer treated initially with progesterone, most recently with definitive RT   Treatment History: Patient reports a history of postmenopausal bleeding since July 2020.  Is been persistent low low level bleeding, spotting but no heavy passage of clots.  She reported this to her gynecologist who ordered a transvaginal ultrasound on June 24, 2019.  This revealed a uterus measuring 6.7 x 3.4 x 5.5 cm with an endometrial thickness of 1.4 cm.  The ovaries were not visualized.   She was taken to the operating room for a curettage and polypectomy on July 17, 2019 which revealed FIGO grade 1 endometrioid adenocarcinoma.   Patient has a complex medical history.  She is morbidly obese with a BMI of 48 kg/m.  She experienced ruptured diverticulitis in 1997 which required a diverting colostomy and Hartman's pouch.  She 6 weeks later she was taken to the operating room for laparotomy and reversal of Hartman's.  She reports that the surgeon described her abdomen is looking like a gallon of superglue had been poured inside her.  It was an extensively long surgery.  She is unsure if additional bowel resections were necessary.  She had a 1 month hospitalization following this reversal of colostomy.  She developed a bowel obstruction during that hospitalization and required reoperation to resolve this.   In 2000 she developed a growth on an ovary (she cannot remember which side).  For this she underwent laparotomy and oophorectomy.  This surgery was performed in [place].  She was planning to have a hysterectomy at that procedure, however the surgeon aborted plans for hysterectomy and performed only oophorectomy after encountering significant adhesive disease.  She states that he told her surgery would have needed to the last 6 to 8 hours in order to perform hysterectomy due to the adhesive disease.    The patient has type 2 diabetes mellitus and has had this for greater than 30 years.  She reports having no endorgan disease.  Her hemoglobin A1c was 5.4% in May 2020.   Her family history is significant for a father with skin cancer.  She has 3 sisters with cancer including one with endometrial cancer who is obese, a sister with stage II 3 breast cancer, and multiple myeloma.   She has had 2 prior vaginal deliveries.   She lives in [place].   On August 20, 2019 she underwent an attempt at a robotic assisted total hysterectomy BSO and sentinel lymph node biopsy.  The surgery was aborted after laparoscopic entry into the left upper quadrant encountered dense adhesions throughout the entirety of the abdomen which included dense colonic and small bowel adhesions to the anterior abdominal wall.  The density of these adhesions and their comprehensive nature across the entirety of her abdominal wall meant that it was not possible to place additional laparoscopic ports to perform adhesiolysis.  She would have required a laparotomy in order to achieve hysterectomy, and the patient had declined this preoperatively, and had elected to proceed with D&C with progestin releasing IUD placement if hysterectomy was not possible via minimally invasive surgical route.  Therefore we aborted the laparoscopic procedure and performed a D&C procedure with placement of a progestin releasing IUD.   Final pathology from the [facility] specimen showed adenocarcinoma favor endometrial primary.  It was difficult to grade the specimen due to his fragmented nature.   Repeat biopsy (office) sampling in December, 2020 showed  complete clinical response with no residual carcinoma.    The patient then transferred care to Dr. Donnamarie Rossetti in [place].  Repeat endometrial biopsy on 03/23/2021 showed residual endometrioid adenocarcinoma.   Repeat biopsy on 06/22/2021 showed FIGO grade 2 endometrioid adenocarcinoma.  There is a comment that her  Megace, which must of been started prior to this, was increased to 160 mg twice daily.   Endometrial biopsy on 09/22/2021 showed residual endometrioid adenocarcinoma, FIGO grade 1-2, in the background of endometrium with exogenous hormone effect.   Pelvic MRI on 12/05/2021 noted a 3.3 cm simple left ovarian cyst, colonic diverticulosis, anterior endometrial mass in the uterine body with an IUD adjacent, suspicious for endometrial cancer.   Repeat endometrial biopsy on 02/01/2022 showed residual endometrial carcinoma, FIGO grade 1.   Staging CT of the chest, abdomen, and pelvis on 02/15/2022 revealed no adenopathy, no aggressive appearing osseous lesions.  Diastases recti with superimposed shallow ventral wall hernia measuring 1 x 7 cm.  Cholelithiasis noted without acute cholecystitis.   03/27/22: IUD removed.   Patient proceeded with definitive radiation therapy from 03/06/2022 - 04/18/2022.  She was treated with EBRT (4500 cGy in 25 fractions) utilizing IMRT treatment technique.  This was followed by weekly T&O HDR treatment to a total dose of 3000 cGy in 5 fractions.  Total treatment dose was 7500 cGy.  Treatment was tolerated well other than fatigue and mild loose stools.   Posttreatment pelvic MRI on 06/12/2022 showed a slightly less bulky uterus.  Significant decrease in the endometrium compared to previous exam with endometrium now measuring 4 mm in thickness (previously was 2.7 cm).  Slight heterogenous signal in the endometrium.  No adenopathy.   She was last seen for follow-up on 06/20/2022 with radiation oncology.  She was doing well at that time without any vaginal bleeding or cramping.   MRI pelvis on 12/10/2022: Loss of normal differentiation between endometrium and zonal anatomy in general at the fundus and mid uterus.  Diffusion properties in this area are nondescript with no focal area of marked restricted diffusion.  Imaging findings are nonspecific although could reflect baseline treatment  changes.  Small volume ascites may be reactive.  Report was addended after prior imaging was obtained.  Loss of defined endometrium could be seen following radiotherapy.  Imaging findings remain more suggestive of posttreatment related changes from radiation but short interval follow-up suggested.   MRI pelvis on 03/09/2023 reveals unchanged appearance of the uterus, with diffuse loss of endometrial definition and mild T2 hyperintensity of the myometrium.  Normal appearance of the cervix and endocervical canal.  Unchanged left ovarian cyst measuring up to 3.9 cm, similar in size dating back to 2017 with no further follow-up recommended.  Unchanged small volume ascites.  Thought to most likely reflect chronic, posttreatment/postradiation appearance of the endometrium and uterus.  No interval change or other suspicious findings to suggest recurrence or residual disease.  Interval History: Doing well.  Denies any vaginal bleeding or discharge.  Has started Gemtesa in the last month and noticed some improvement.  Specifically, she is having less urinary incontinence overnight.  Endorses normal bowel function.  Has been experiencing some intermittent dizziness.  She is looking forward to traveling [place] with one of her sisters to visit her other 2 sisters.  Past Medical/Surgical History: Past Medical History:  Diagnosis Date   Atrial fibrillation (HCC)    persistent (outpt monitoring 08/2022)   Bilateral lower extremity edema    Carpal tunnel syndrome    bilateral,  had surgery on right hand   Chronic renal insufficiency, stage 3 (moderate) (HCC)    GFR 50s   Diabetes mellitus without complication (HCC)    Dr. Sharl Ma   Diverticulosis    +hx of 'itis   Endometrial cancer [facility])    RT spring 2024   GERD (gastroesophageal reflux disease)    Gout    No problems after getting on allopurinol   Heart murmur    soft, systolic   History of blood transfusion    History of colon polyps    History of iron  deficiency    Hyperlipidemia    Hypertension    Osteoarthritis, multiple sites    esp knees   Urinary incontinence     Past Surgical History:  Procedure Laterality Date   CARPAL TUNNEL RELEASE Right 1991   COLON SURGERY     COLONOSCOPY     x2.  Last was 2018   COLOSTOMY     COLOSTOMY REVERSAL     DILATATION & CURETTAGE/HYSTEROSCOPY WITH MYOSURE N/A 07/17/2019   Procedure: DILATATION & CURETTAGE/HYSTEROSCOPY WITH MYOSURE;  Surgeon: Candice Camp, MD;  Location: [facility];  Service: Gynecology;  Laterality: N/A;   OOPHORECTOMY  2000   unilateral   PILONIDAL CYST EXCISION     ROBOTIC ASSISTED TOTAL HYSTERECTOMY WITH BILATERAL SALPINGO OOPHERECTOMY N/A 08/20/2019   Procedure: DIAGNSOTIC LAPAROSCOPY, DILATION AND CURETTAGE, IUD PLACEMENT ;  Surgeon: Adolphus Birchwood, MD;  Location: [facility];  Service: Gynecology;  Laterality: N/A;   SKIN CANCER EXCISION     TRANSTHORACIC ECHOCARDIOGRAM     08/2022 EF 50-55%, inc PA pressure, nl RV fxn, valves ok    Family History  Problem Relation Age of Onset   Hypertension Mother    Skin cancer Father    Diabetes Sister    Cancer Sister    Hypertension Sister    Cancer Sister    Diabetes Sister    Hearing loss Sister    Hypertension Sister    Kidney disease Sister    Cancer Sister    Diabetes Brother    Diabetes Maternal Grandfather    Stroke Maternal Grandfather    Diabetes Paternal Grandmother    Hearing loss Daughter     Social History   Socioeconomic History   Marital status: Married    Spouse name: Not on file   Number of children: Not on file   Years of education: Not on file   Highest education level: Not on file  Occupational History   Not on file  Tobacco Use   Smoking status: Former    Packs/day: 3.00    Years: 22.00    Additional pack years: 0.00    Total pack years: 66.00    Types: Cigarettes    Quit date: 06/08/1987    Years since quitting: 36.0   Smokeless tobacco: Never  Vaping Use   Vaping Use:  Never used  Substance and Sexual Activity   Alcohol use: Yes    Comment: twice per week   Drug use: No   Sexual activity: Yes    Partners: Male    Birth control/protection: Post-menopausal    Comment: married  Other Topics Concern   Not on file  Social History Narrative   Married, 2 children, 2 grandchildren.   Originally from [place].   Education: Barista.   Occupation: Retired Airline pilot.   History of smoking.   Social Determinants of Health   Financial Resource Strain: Low Risk  (05/09/2023)  Overall Financial Resource Strain (CARDIA)    Difficulty of Paying Living Expenses: Not very hard  Food Insecurity: No Food Insecurity (05/09/2023)   Hunger Vital Sign    Worried About Running Out of Food in the Last Year: Never true    Ran Out of Food in the Last Year: Never true  Transportation Needs: No Transportation Needs (05/09/2023)   PRAPARE - Administrator, Civil Service (Medical): No    Lack of Transportation (Non-Medical): No  Physical Activity: Inactive (05/09/2023)   Exercise Vital Sign    Days of Exercise per Week: 0 days    Minutes of Exercise per Session: 0 min  Stress: No Stress Concern Present (05/09/2023)   [employer] of Occupational Health - Occupational Stress Questionnaire    Feeling of Stress : Only a little  Social Connections: Socially Integrated (05/09/2023)   Social Connection and Isolation Panel [NHANES]    Frequency of Communication with Friends and Family: More than three times a week    Frequency of Social Gatherings with Friends and Family: Three times a week    Attends Religious Services: More than 4 times per year    Active Member of Clubs or Organizations: No    Attends Engineer, structural: More than 4 times per year    Marital Status: Married    Current Medications:  Current Outpatient Medications:    Acetaminophen (TYLENOL PO), Take 1,000 mg by mouth 3 (three) times daily., Disp: , Rfl:    allopurinol  (ZYLOPRIM) 300 MG tablet, TAKE 1 TABLET BY MOUTH EVERY DAY, Disp: 90 tablet, Rfl: 1   amLODipine (NORVASC) 5 MG tablet, TAKE 1 TABLET (5 MG TOTAL) BY MOUTH DAILY., Disp: 90 tablet, Rfl: 1   apixaban (ELIQUIS) 5 MG TABS tablet, Take 1 tablet (5 mg total) by mouth 2 (two) times daily., Disp: 180 tablet, Rfl: 3   atorvastatin (LIPITOR) 80 MG tablet, Take 1 tablet (80 mg total) by mouth at bedtime., Disp: 90 tablet, Rfl: 3   carvedilol (COREG) 12.5 MG tablet, Take 1 tablet (12.5 mg total) by mouth 2 (two) times daily with a meal., Disp: 180 tablet, Rfl: 3   Cholecalciferol (VITAMIN D3 PO), Take 1 capsule by mouth every evening. , Disp: , Rfl:    Coenzyme Q10 (COQ10) 100 MG CAPS, Take 100 mg by mouth every evening., Disp: , Rfl:    famotidine (PEPCID) 20 MG tablet, Take 20 mg by mouth daily. , Disp: , Rfl:    furosemide (LASIX) 40 MG tablet, Take 2 tablets (80 mg) in the morning., Disp: 270 tablet, Rfl: 3   GLUCOSAMINE-CHONDROITIN PO, Take 1 tablet by mouth 2 (two) times daily., Disp: , Rfl:    insulin NPH-regular Human (70-30) 100 UNIT/ML injection, Inject into the skin daily. 60 units In the morning and 40 units in the evening, Disp: , Rfl:    KRILL OIL PO, Take 1 capsule by mouth daily., Disp: , Rfl:    losartan (COZAAR) 50 MG tablet, Take 1 tablet (50 mg total) by mouth daily., Disp: 90 tablet, Rfl: 0   meclizine (ANTIVERT) 12.5 MG tablet, Take 1 tablet (12.5 mg total) by mouth 3 (three) times daily as needed for dizziness., Disp: 30 tablet, Rfl: 0   MELATONIN PO, Take 10 mg by mouth at bedtime., Disp: , Rfl:    Multiple Vitamins-Minerals (MULTIVITAMIN WITH MINERALS) tablet, Take 1 tablet by mouth daily., Disp: , Rfl:    potassium chloride (KLOR-CON) 10 MEQ tablet, Take 1  tablet (10 mEq total) by mouth 2 (two) times daily., Disp: 180 tablet, Rfl: 3   Vibegron (GEMTESA) 75 MG TABS, Take 1 tablet (75 mg total) by mouth daily., Disp: 30 tablet, Rfl: 1   Carboxymethylcellul-Glycerin (LUBRICATING EYE  DROPS OP), Place 1 drop into both eyes daily as needed (dry eyes). (Patient not taking: Reported on 05/09/2023), Disp: , Rfl:   Review of Systems: + dizziness Denies appetite changes, fevers, chills, fatigue, unexplained weight changes. Denies hearing loss, neck lumps or masses, mouth sores, ringing in ears or voice changes. Denies cough or wheezing.  Denies shortness of breath. Denies chest pain or palpitations. Denies leg swelling. Denies abdominal distention, pain, blood in stools, constipation, diarrhea, nausea, vomiting, or early satiety. Denies pain with intercourse, dysuria, frequency, hematuria or incontinence. Denies hot flashes, pelvic pain, vaginal bleeding or vaginal discharge.   Denies joint pain, back pain or muscle pain/cramps. Denies itching, rash, or wounds. Denies headaches, numbness or seizures. Denies swollen lymph nodes or glands, denies easy bruising or bleeding. Denies anxiety, depression, confusion, or decreased concentration.  Physical Exam: BP (!) 125/56 (BP Location: Left Wrist, Patient Position: Sitting)   Pulse 78   Temp (!) 96.5 F (35.8 C) (Axillary)   Ht 5' 4.96" (1.65 m)   Wt (!) 309 lb 12.8 oz (140.5 kg)   SpO2 98%   BMI 51.62 kg/m  General: Alert, oriented, no acute distress. HEENT: Normocephalic, atraumatic, sclera anicteric. Chest: Somewhat limited by body habitus but clear to auscultation bilaterally.   Cardiovascular: regular rate, regular rhythm, no murmurs.  Abdomen: Obese, soft, nontender.  Normoactive bowel sounds.  No masses or hepatosplenomegaly appreciated.  Subcutaneous edema in pannus. Extremities: Somewhat limited range of motion.  Warm, well perfused.  Compression wraps on both lower extremities with minimal edema in bilateral feet.   Lymphatics: No cervical, supraclavicular, or inguinal adenopathy. GU: Normal appearing external genitalia without erythema, excoriation, or lesions.  Speculum exam reveals moderately atrophic vaginal  mucosa.  Cervix is somewhat difficult to visualize and rather flush with the vaginal apex, atrophic.  No bleeding or discharge.  Bimanual exam reveals no masses or nodularity, cervix flush with the vagina.  Uterine assessment is limited by body habitus.  Rectovaginal exam confirms findings, no parametrial nodularity.  Laboratory & Radiologic Studies: None new  Assessment & Plan: Shaniece Drill is a 74 y.o. woman with clinical stage I grade 1 endometrioid endometrial cancer, non-operative candidate s/p D&C and IUD (progestin releasing) on 08/20/19. Complete clinical response to progestin therapy with negative repeat sampling in December, 2020. Developed recurrence based on biopsy in 02/2021. She was started on Megace which was increased in 05/2022 when biopsy showed FIGO grade 2 endometrial cancer. Given persistent malignancy on biopsy, IUD removed 3/023 and patient treated with definitive RT completed in 03/2022.    Patient continues to do well without symptoms.  She is NED on exam although her exam is limited by prior radiation and body habitus. We reviewed recent MRI findings, no significant change noted from to prior studies.  We will plan on repeat MRI in 4-6 months given inability to follow her clinically with exam. This is scheduled for August.   We will continue with surveillance visits every 3 months.  We discussed signs and symptoms that would be concerning for cancer recurrence, and I stressed the importance of calling if she develops any of these.   She is doing well on Gemtesa for her urinary incontinence.  20 minutes of total time was spent for  this patient encounter, including preparation, face-to-face counseling with the patient and coordination of care, and documentation of the encounter.  Eugene Garnet, MD  Division of Gynecologic Oncology  Department of Obstetrics and Gynecology  [facility]

## 2023-06-07 ENCOUNTER — Encounter: Payer: Self-pay | Admitting: Gynecologic Oncology

## 2023-06-07 ENCOUNTER — Other Ambulatory Visit: Payer: Self-pay

## 2023-06-07 ENCOUNTER — Inpatient Hospital Stay: Payer: Medicare Other | Attending: Gynecologic Oncology | Admitting: Gynecologic Oncology

## 2023-06-07 VITALS — BP 125/56 | HR 78 | Temp 96.5°F | Ht 64.96 in | Wt 309.8 lb

## 2023-06-07 DIAGNOSIS — Z6841 Body Mass Index (BMI) 40.0 and over, adult: Secondary | ICD-10-CM

## 2023-06-07 DIAGNOSIS — Z8049 Family history of malignant neoplasm of other genital organs: Secondary | ICD-10-CM | POA: Diagnosis not present

## 2023-06-07 DIAGNOSIS — Z808 Family history of malignant neoplasm of other organs or systems: Secondary | ICD-10-CM | POA: Diagnosis not present

## 2023-06-07 DIAGNOSIS — C541 Malignant neoplasm of endometrium: Secondary | ICD-10-CM

## 2023-06-07 DIAGNOSIS — Z79899 Other long term (current) drug therapy: Secondary | ICD-10-CM | POA: Diagnosis not present

## 2023-06-07 DIAGNOSIS — Z803 Family history of malignant neoplasm of breast: Secondary | ICD-10-CM

## 2023-06-07 DIAGNOSIS — Z923 Personal history of irradiation: Secondary | ICD-10-CM

## 2023-06-07 DIAGNOSIS — N3941 Urge incontinence: Secondary | ICD-10-CM

## 2023-06-07 DIAGNOSIS — Z8542 Personal history of malignant neoplasm of other parts of uterus: Secondary | ICD-10-CM | POA: Diagnosis not present

## 2023-06-07 DIAGNOSIS — Z87891 Personal history of nicotine dependence: Secondary | ICD-10-CM

## 2023-06-07 DIAGNOSIS — Z90722 Acquired absence of ovaries, bilateral: Secondary | ICD-10-CM

## 2023-06-07 DIAGNOSIS — R32 Unspecified urinary incontinence: Secondary | ICD-10-CM

## 2023-06-07 DIAGNOSIS — E118 Type 2 diabetes mellitus with unspecified complications: Secondary | ICD-10-CM | POA: Diagnosis not present

## 2023-06-07 NOTE — Patient Instructions (Signed)
It was good to see you today.  I do not see or feel any evidence of cancer recurrence on your exam.  I will see you for follow-up in 3 months.  As always, if you develop any new and concerning symptoms before your next visit, please call to see me sooner.  

## 2023-06-15 ENCOUNTER — Encounter: Payer: Self-pay | Admitting: Family Medicine

## 2023-06-15 DIAGNOSIS — I10 Essential (primary) hypertension: Secondary | ICD-10-CM

## 2023-06-15 DIAGNOSIS — M1A9XX0 Chronic gout, unspecified, without tophus (tophi): Secondary | ICD-10-CM

## 2023-06-17 MED ORDER — ALLOPURINOL 300 MG PO TABS: 300.0000 mg | ORAL_TABLET | Freq: Every day | ORAL | 1 refills | Status: DC

## 2023-06-17 MED ORDER — AMLODIPINE BESYLATE 5 MG PO TABS: 5.0000 mg | ORAL_TABLET | Freq: Every day | ORAL | 1 refills | Status: DC

## 2023-06-18 DIAGNOSIS — M25532 Pain in left wrist: Secondary | ICD-10-CM | POA: Diagnosis not present

## 2023-06-27 DIAGNOSIS — G5602 Carpal tunnel syndrome, left upper limb: Secondary | ICD-10-CM | POA: Diagnosis not present

## 2023-07-18 DIAGNOSIS — E119 Type 2 diabetes mellitus without complications: Secondary | ICD-10-CM | POA: Diagnosis not present

## 2023-07-18 DIAGNOSIS — H524 Presbyopia: Secondary | ICD-10-CM | POA: Diagnosis not present

## 2023-07-18 DIAGNOSIS — H2513 Age-related nuclear cataract, bilateral: Secondary | ICD-10-CM | POA: Diagnosis not present

## 2023-07-18 LAB — HM DIABETES EYE EXAM

## 2023-07-22 ENCOUNTER — Ambulatory Visit (INDEPENDENT_AMBULATORY_CARE_PROVIDER_SITE_OTHER): Payer: Medicare Other | Admitting: Podiatry

## 2023-07-22 ENCOUNTER — Encounter: Payer: Self-pay | Admitting: Podiatry

## 2023-07-22 DIAGNOSIS — M79674 Pain in right toe(s): Secondary | ICD-10-CM | POA: Diagnosis not present

## 2023-07-22 DIAGNOSIS — E118 Type 2 diabetes mellitus with unspecified complications: Secondary | ICD-10-CM | POA: Diagnosis not present

## 2023-07-22 DIAGNOSIS — M79675 Pain in left toe(s): Secondary | ICD-10-CM | POA: Diagnosis not present

## 2023-07-22 DIAGNOSIS — B351 Tinea unguium: Secondary | ICD-10-CM | POA: Diagnosis not present

## 2023-07-22 NOTE — Progress Notes (Signed)
This patient returns to my office for at risk foot care.  This patient requires this care by a professional since this patient will be at risk due to having diabetes.  She is wearing bandages on her lower legs.  This patient is unable to cut nails herself since the patient cannot reach her nails.These nails are painful walking and wearing shoes.  This patient presents for at risk foot care today.  General Appearance  Alert, conversant and in no acute stress.  Vascular  Dorsalis pedis and posterior tibial  pulses are palpable  bilaterally.  Capillary return is within normal limits  bilaterally. Temperature is within normal limits  bilaterally.  Neurologic  Senn-Weinstein monofilament wire test within normal limits  bilaterally. Muscle power within normal limits bilaterally.  Nails Thick disfigured discolored nails with subungual debris  from hallux to fifth toes bilaterally. No evidence of bacterial infection or drainage bilaterally.  Orthopedic  No limitations of motion  feet .  No crepitus or effusions noted.  No bony pathology or digital deformities noted.  Skin  normotropic skin with no porokeratosis noted bilaterally.  No signs of infections or ulcers noted.     Onychomycosis  Pain in right toes  Pain in left toes  Consent was obtained for treatment procedures.   Mechanical debridement of nails 1-5  bilaterally performed with a nail nipper.  Filed with dremel without incident.    Return office visit      10   weeks              Told patient to return for periodic foot care and evaluation due to potential at risk complications.   Helane Gunther DPM

## 2023-07-29 DIAGNOSIS — N1831 Chronic kidney disease, stage 3a: Secondary | ICD-10-CM | POA: Diagnosis not present

## 2023-07-29 DIAGNOSIS — E1122 Type 2 diabetes mellitus with diabetic chronic kidney disease: Secondary | ICD-10-CM | POA: Diagnosis not present

## 2023-07-29 DIAGNOSIS — R600 Localized edema: Secondary | ICD-10-CM | POA: Diagnosis not present

## 2023-07-29 DIAGNOSIS — E785 Hyperlipidemia, unspecified: Secondary | ICD-10-CM | POA: Diagnosis not present

## 2023-07-29 DIAGNOSIS — Z794 Long term (current) use of insulin: Secondary | ICD-10-CM | POA: Diagnosis not present

## 2023-07-31 ENCOUNTER — Encounter (HOSPITAL_BASED_OUTPATIENT_CLINIC_OR_DEPARTMENT_OTHER): Payer: Self-pay

## 2023-07-31 ENCOUNTER — Emergency Department (HOSPITAL_BASED_OUTPATIENT_CLINIC_OR_DEPARTMENT_OTHER): Payer: Medicare Other

## 2023-07-31 ENCOUNTER — Other Ambulatory Visit: Payer: Self-pay

## 2023-07-31 ENCOUNTER — Inpatient Hospital Stay (HOSPITAL_BASED_OUTPATIENT_CLINIC_OR_DEPARTMENT_OTHER)
Admission: EM | Admit: 2023-07-31 | Discharge: 2023-08-06 | DRG: 388 | Disposition: A | Discharge status: Home Health | Payer: Medicare Other | Attending: Internal Medicine | Admitting: Internal Medicine

## 2023-07-31 DIAGNOSIS — J9601 Acute respiratory failure with hypoxia: Secondary | ICD-10-CM | POA: Insufficient documentation

## 2023-07-31 DIAGNOSIS — K565 Intestinal adhesions [bands], unspecified as to partial versus complete obstruction: Principal | ICD-10-CM | POA: Diagnosis present

## 2023-07-31 DIAGNOSIS — E118 Type 2 diabetes mellitus with unspecified complications: Secondary | ICD-10-CM | POA: Diagnosis not present

## 2023-07-31 DIAGNOSIS — E1122 Type 2 diabetes mellitus with diabetic chronic kidney disease: Secondary | ICD-10-CM | POA: Diagnosis present

## 2023-07-31 DIAGNOSIS — C541 Malignant neoplasm of endometrium: Secondary | ICD-10-CM | POA: Diagnosis not present

## 2023-07-31 DIAGNOSIS — R5381 Other malaise: Secondary | ICD-10-CM | POA: Diagnosis present

## 2023-07-31 DIAGNOSIS — N183 Chronic kidney disease, stage 3 unspecified: Secondary | ICD-10-CM | POA: Diagnosis present

## 2023-07-31 DIAGNOSIS — I700 Atherosclerosis of aorta: Secondary | ICD-10-CM | POA: Diagnosis not present

## 2023-07-31 DIAGNOSIS — Z90710 Acquired absence of both cervix and uterus: Secondary | ICD-10-CM

## 2023-07-31 DIAGNOSIS — R011 Cardiac murmur, unspecified: Secondary | ICD-10-CM | POA: Diagnosis present

## 2023-07-31 DIAGNOSIS — I480 Paroxysmal atrial fibrillation: Secondary | ICD-10-CM | POA: Diagnosis present

## 2023-07-31 DIAGNOSIS — R0902 Hypoxemia: Secondary | ICD-10-CM

## 2023-07-31 DIAGNOSIS — K219 Gastro-esophageal reflux disease without esophagitis: Secondary | ICD-10-CM | POA: Diagnosis present

## 2023-07-31 DIAGNOSIS — I4891 Unspecified atrial fibrillation: Secondary | ICD-10-CM | POA: Diagnosis present

## 2023-07-31 DIAGNOSIS — R918 Other nonspecific abnormal finding of lung field: Secondary | ICD-10-CM | POA: Diagnosis not present

## 2023-07-31 DIAGNOSIS — R112 Nausea with vomiting, unspecified: Secondary | ICD-10-CM | POA: Diagnosis not present

## 2023-07-31 DIAGNOSIS — N83292 Other ovarian cyst, left side: Secondary | ICD-10-CM | POA: Diagnosis not present

## 2023-07-31 DIAGNOSIS — Z794 Long term (current) use of insulin: Secondary | ICD-10-CM | POA: Diagnosis not present

## 2023-07-31 DIAGNOSIS — Z87891 Personal history of nicotine dependence: Secondary | ICD-10-CM

## 2023-07-31 DIAGNOSIS — G5602 Carpal tunnel syndrome, left upper limb: Secondary | ICD-10-CM | POA: Diagnosis present

## 2023-07-31 DIAGNOSIS — N736 Female pelvic peritoneal adhesions (postinfective): Secondary | ICD-10-CM | POA: Diagnosis present

## 2023-07-31 DIAGNOSIS — I130 Hypertensive heart and chronic kidney disease with heart failure and stage 1 through stage 4 chronic kidney disease, or unspecified chronic kidney disease: Secondary | ICD-10-CM | POA: Diagnosis present

## 2023-07-31 DIAGNOSIS — J168 Pneumonia due to other specified infectious organisms: Secondary | ICD-10-CM | POA: Diagnosis not present

## 2023-07-31 DIAGNOSIS — M109 Gout, unspecified: Secondary | ICD-10-CM | POA: Diagnosis present

## 2023-07-31 DIAGNOSIS — E785 Hyperlipidemia, unspecified: Secondary | ICD-10-CM | POA: Diagnosis present

## 2023-07-31 DIAGNOSIS — Z8249 Family history of ischemic heart disease and other diseases of the circulatory system: Secondary | ICD-10-CM

## 2023-07-31 DIAGNOSIS — Z888 Allergy status to other drugs, medicaments and biological substances status: Secondary | ICD-10-CM

## 2023-07-31 DIAGNOSIS — J690 Pneumonitis due to inhalation of food and vomit: Secondary | ICD-10-CM | POA: Diagnosis present

## 2023-07-31 DIAGNOSIS — K5669 Other partial intestinal obstruction: Secondary | ICD-10-CM | POA: Diagnosis not present

## 2023-07-31 DIAGNOSIS — Z85828 Personal history of other malignant neoplasm of skin: Secondary | ICD-10-CM

## 2023-07-31 DIAGNOSIS — I5033 Acute on chronic diastolic (congestive) heart failure: Secondary | ICD-10-CM | POA: Diagnosis present

## 2023-07-31 DIAGNOSIS — K56609 Unspecified intestinal obstruction, unspecified as to partial versus complete obstruction: Secondary | ICD-10-CM

## 2023-07-31 DIAGNOSIS — E860 Dehydration: Secondary | ICD-10-CM | POA: Diagnosis present

## 2023-07-31 DIAGNOSIS — R1030 Lower abdominal pain, unspecified: Secondary | ICD-10-CM | POA: Diagnosis present

## 2023-07-31 DIAGNOSIS — T17908A Unspecified foreign body in respiratory tract, part unspecified causing other injury, initial encounter: Secondary | ICD-10-CM

## 2023-07-31 DIAGNOSIS — M159 Polyosteoarthritis, unspecified: Secondary | ICD-10-CM | POA: Diagnosis present

## 2023-07-31 DIAGNOSIS — Z6841 Body Mass Index (BMI) 40.0 and over, adult: Secondary | ICD-10-CM

## 2023-07-31 DIAGNOSIS — Z90722 Acquired absence of ovaries, bilateral: Secondary | ICD-10-CM

## 2023-07-31 DIAGNOSIS — I10 Essential (primary) hypertension: Secondary | ICD-10-CM | POA: Diagnosis not present

## 2023-07-31 DIAGNOSIS — T501X5A Adverse effect of loop [high-ceiling] diuretics, initial encounter: Secondary | ICD-10-CM | POA: Diagnosis present

## 2023-07-31 DIAGNOSIS — Z993 Dependence on wheelchair: Secondary | ICD-10-CM | POA: Diagnosis not present

## 2023-07-31 DIAGNOSIS — Z86010 Personal history of colonic polyps: Secondary | ICD-10-CM

## 2023-07-31 DIAGNOSIS — Z833 Family history of diabetes mellitus: Secondary | ICD-10-CM

## 2023-07-31 DIAGNOSIS — R55 Syncope and collapse: Secondary | ICD-10-CM | POA: Diagnosis not present

## 2023-07-31 DIAGNOSIS — Z4682 Encounter for fitting and adjustment of non-vascular catheter: Secondary | ICD-10-CM | POA: Diagnosis not present

## 2023-07-31 DIAGNOSIS — K8020 Calculus of gallbladder without cholecystitis without obstruction: Secondary | ICD-10-CM | POA: Diagnosis not present

## 2023-07-31 DIAGNOSIS — R1110 Vomiting, unspecified: Secondary | ICD-10-CM | POA: Diagnosis not present

## 2023-07-31 DIAGNOSIS — Z7901 Long term (current) use of anticoagulants: Secondary | ICD-10-CM

## 2023-07-31 DIAGNOSIS — E876 Hypokalemia: Secondary | ICD-10-CM | POA: Diagnosis present

## 2023-07-31 DIAGNOSIS — Z8542 Personal history of malignant neoplasm of other parts of uterus: Secondary | ICD-10-CM | POA: Diagnosis not present

## 2023-07-31 DIAGNOSIS — R0602 Shortness of breath: Secondary | ICD-10-CM | POA: Diagnosis not present

## 2023-07-31 DIAGNOSIS — Z841 Family history of disorders of kidney and ureter: Secondary | ICD-10-CM

## 2023-07-31 DIAGNOSIS — N179 Acute kidney failure, unspecified: Secondary | ICD-10-CM | POA: Diagnosis present

## 2023-07-31 DIAGNOSIS — Z9049 Acquired absence of other specified parts of digestive tract: Secondary | ICD-10-CM

## 2023-07-31 DIAGNOSIS — Z79899 Other long term (current) drug therapy: Secondary | ICD-10-CM

## 2023-07-31 DIAGNOSIS — I517 Cardiomegaly: Secondary | ICD-10-CM | POA: Diagnosis not present

## 2023-07-31 DIAGNOSIS — E782 Mixed hyperlipidemia: Secondary | ICD-10-CM | POA: Diagnosis not present

## 2023-07-31 DIAGNOSIS — J189 Pneumonia, unspecified organism: Principal | ICD-10-CM

## 2023-07-31 LAB — COMPREHENSIVE METABOLIC PANEL
ALT: 13 U/L (ref 0–44)
AST: 40 U/L (ref 15–41)
Albumin: 4 g/dL (ref 3.5–5.0)
Alkaline Phosphatase: 85 U/L (ref 38–126)
Anion gap: 13 (ref 5–15)
BUN: 29 mg/dL — ABNORMAL HIGH (ref 8–23)
CO2: 26 mmol/L (ref 22–32)
Calcium: 9.9 mg/dL (ref 8.9–10.3)
Chloride: 102 mmol/L (ref 98–111)
Creatinine, Ser: 1.69 mg/dL — ABNORMAL HIGH (ref 0.44–1.00)
GFR, Estimated: 31 mL/min — ABNORMAL LOW (ref >60)
Glucose, Bld: 181 mg/dL — ABNORMAL HIGH (ref 70–99)
Potassium: 5 mmol/L (ref 3.5–5.1)
Sodium: 141 mmol/L (ref 135–145)
Total Bilirubin: 1.1 mg/dL (ref 0.3–1.2)
Total Protein: 7 g/dL (ref 6.5–8.1)

## 2023-07-31 LAB — CBC WITH DIFFERENTIAL/PLATELET
Abs Immature Granulocytes: 0.05 10*3/uL (ref 0.00–0.07)
Basophils Absolute: 0 10*3/uL (ref 0.0–0.1)
Basophils Relative: 0 %
Eosinophils Absolute: 0 10*3/uL (ref 0.0–0.5)
Eosinophils Relative: 0 %
HCT: 40.3 % (ref 36.0–46.0)
Hemoglobin: 13.3 g/dL (ref 12.0–15.0)
Immature Granulocytes: 1 %
Lymphocytes Relative: 4 %
Lymphs Abs: 0.4 10*3/uL — ABNORMAL LOW (ref 0.7–4.0)
MCH: 33.3 pg (ref 26.0–34.0)
MCHC: 33 g/dL (ref 30.0–36.0)
MCV: 101 fL — ABNORMAL HIGH (ref 80.0–100.0)
Monocytes Absolute: 0.6 10*3/uL (ref 0.1–1.0)
Monocytes Relative: 7 %
Neutro Abs: 8.5 10*3/uL — ABNORMAL HIGH (ref 1.7–7.7)
Neutrophils Relative %: 88 %
Platelets: 242 10*3/uL (ref 150–400)
RBC: 3.99 MIL/uL (ref 3.87–5.11)
RDW: 15.1 % (ref 11.5–15.5)
WBC: 9.5 10*3/uL (ref 4.0–10.5)
nRBC: 0 % (ref 0.0–0.2)

## 2023-07-31 LAB — PROTIME-INR
INR: 1.2 (ref 0.8–1.2)
Prothrombin Time: 15.2 seconds (ref 11.4–15.2)

## 2023-07-31 LAB — LACTIC ACID, PLASMA
Lactic Acid, Venous: 2.8 mmol/L (ref 0.5–1.9)
Lactic Acid, Venous: 2.2 mmol/L (ref 0.5–1.9)

## 2023-07-31 LAB — BRAIN NATRIURETIC PEPTIDE: B Natriuretic Peptide: 379.8 pg/mL — ABNORMAL HIGH (ref 0.0–100.0)

## 2023-07-31 MED ORDER — SODIUM CHLORIDE 0.9 % IV SOLN
500.0000 mg | Freq: Once | INTRAVENOUS | Status: AC
  Administered 2023-08-01: 500 mg via INTRAVENOUS
  Filled 2023-07-31: qty 5

## 2023-07-31 MED ORDER — SODIUM CHLORIDE 0.9 % IV SOLN: 1.0000 g | Freq: Once | INTRAVENOUS | Status: DC

## 2023-07-31 MED ORDER — IOHEXOL 350 MG/ML SOLN
100.0000 mL | Freq: Once | INTRAVENOUS | Status: AC | PRN
  Administered 2023-07-31: 75 mL via INTRAVENOUS

## 2023-07-31 MED ORDER — ONDANSETRON HCL 4 MG/2ML IJ SOLN
4.0000 mg | Freq: Once | INTRAMUSCULAR | Status: AC
  Administered 2023-08-01: 4 mg via INTRAVENOUS
  Filled 2023-07-31: qty 2

## 2023-07-31 MED ORDER — SODIUM CHLORIDE 0.9 % IV SOLN
2.0000 g | Freq: Once | INTRAVENOUS | Status: AC
  Administered 2023-07-31: 2 g via INTRAVENOUS
  Filled 2023-07-31: qty 20

## 2023-07-31 MED ORDER — SODIUM CHLORIDE 0.9 % IV SOLN: Freq: Once | INTRAVENOUS | Status: AC

## 2023-07-31 MED ORDER — SODIUM CHLORIDE 0.9 % IV BOLUS
500.0000 mL | Freq: Once | INTRAVENOUS | Status: AC
  Administered 2023-07-31: 500 mL via INTRAVENOUS

## 2023-07-31 NOTE — ED Triage Notes (Addendum)
POV from home, A&o x 4, GCS 15, bib wheelchair  Emesis since last night. Syncope this morning, possible aspiration per pt due to vomiting and falling this morning. Unable to get good story due to pt acuity at triage.  HR 150 and 81% on RA

## 2023-07-31 NOTE — ED Provider Notes (Signed)
[place] EMERGENCY DEPARTMENT AT [facility] Provider Note   CSN: [id number] Arrival date & time: 07/31/23  2007     History {Add pertinent medical, surgical, social history, OB history to HPI:1} Chief Complaint  Patient presents with   Emesis   Fever    [name] is a 73 y.o. female.  73 year old female with a history of atrial fibrillation on Eliquis, hypertension, hyperlipidemia, diabetes, and endometrial cancer who presents to the emergency department with abdominal pain and nausea and vomiting.  Patient reports that since last night she has had lower abdominal pain that is mild in severity but recurrent nausea and vomiting.  Says that her emesis is dark but is nonbloody nonbilious.  Last bowel movement was several days ago.  Does not think she is passing gas today.  Says that she passed out while throwing up today and had gurgling noises and then has been short of breath and had a cough since then.  Does not wear home oxygen.  No chest pain.  Does have lower extremity swelling at baseline.  Has had a colostomy that has been reversed as well as a total abdominal hysterectomy and history of adhesions.       Home Medications Prior to Admission medications   Medication Sig Start Date End Date Taking? Authorizing Provider  Acetaminophen (TYLENOL PO) Take 1,000 mg by mouth 3 (three) times daily.    [provider]  allopurinol (ZYLOPRIM) 300 MG tablet Take 1 tablet (300 mg total) by mouth daily. 06/17/23   McGowen, Maryjean Morn, MD  amLODipine (NORVASC) 5 MG tablet Take 1 tablet (5 mg total) by mouth daily. 06/17/23   McGowen, Maryjean Morn, MD  apixaban (ELIQUIS) 5 MG TABS tablet Take 1 tablet (5 mg total) by mouth 2 (two) times daily. 09/10/22   Pricilla Riffle, MD  atorvastatin (LIPITOR) 80 MG tablet Take 1 tablet (80 mg total) by mouth at bedtime. 08/07/22   Medina-Vargas, Monina C, NP  Carboxymethylcellul-Glycerin (LUBRICATING EYE DROPS OP) Place 1 drop into both eyes daily  as needed (dry eyes). Patient not taking: Reported on 05/09/2023    [provider]  carvedilol (COREG) 12.5 MG tablet Take 1 tablet (12.5 mg total) by mouth 2 (two) times daily with a meal. 09/28/22   Pricilla Riffle, MD  Cholecalciferol (VITAMIN D3 PO) Take 1 capsule by mouth every evening.     [provider]  Coenzyme Q10 (COQ10) 100 MG CAPS Take 100 mg by mouth every evening.    [provider]  famotidine (PEPCID) 20 MG tablet Take 20 mg by mouth daily.     [provider]  furosemide (LASIX) 40 MG tablet Take 2 tablets (80 mg) in the morning. 11/29/22   Pricilla Riffle, MD  GLUCOSAMINE-CHONDROITIN PO Take 1 tablet by mouth 2 (two) times daily.    [provider]  insulin NPH-regular Human (70-30) 100 UNIT/ML injection Inject into the skin daily. 60 units In the morning and 40 units in the evening    [provider]  KRILL OIL PO Take 1 capsule by mouth daily.    [provider]  losartan (COZAAR) 50 MG tablet Take 1 tablet (50 mg total) by mouth daily. 05/29/23   Pricilla Riffle, MD  meclizine (ANTIVERT) 12.5 MG tablet Take 1 tablet (12.5 mg total) by mouth 3 (three) times daily as needed for dizziness. 07/07/22   Wallis Bamberg, PA-C  MELATONIN PO Take 10 mg by mouth at bedtime.  [provider]  Multiple Vitamins-Minerals (MULTIVITAMIN WITH MINERALS) tablet Take 1 tablet by mouth daily.    [provider]  potassium chloride (KLOR-CON) 10 MEQ tablet Take 1 tablet (10 mEq total) by mouth 2 (two) times daily. 11/13/22   Pricilla Riffle, MD  Vibegron (GEMTESA) 75 MG TABS Take 1 tablet (75 mg total) by mouth daily. 05/09/23   McGowen, Maryjean Morn, MD      Allergies    Lisinopril, Actos [pioglitazone], Metformin, and Metformin and related    Review of Systems   Review of Systems  Physical Exam Updated Vital Signs BP 132/87   Pulse 99   Temp 99.2 F (37.3 C)   Resp (!) 30   Ht 5\' 4"  (1.626 m)   Wt (!) 140.5 kg   SpO2 95%    BMI 53.17 kg/m  Physical Exam Vitals and nursing note reviewed.  Constitutional:      General: She is in acute distress.     Appearance: She is well-developed. She is ill-appearing.  HENT:     Head: Normocephalic and atraumatic.     Right Ear: External ear normal.     Left Ear: External ear normal.     Nose: Nose normal.  Eyes:     Extraocular Movements: Extraocular movements intact.     Conjunctiva/sclera: Conjunctivae normal.     Pupils: Pupils are equal, round, and reactive to light.  Cardiovascular:     Rate and Rhythm: Tachycardia present. Rhythm irregular.     Heart sounds: No murmur heard.    Comments: Atrial fibrillation on the monitor Pulmonary:     Effort: Pulmonary effort is normal. No respiratory distress.     Breath sounds: Normal breath sounds.     Comments: Somewhat limited due to habitus.  On 5 L nasal cannula which is a new oxygen requirement. Abdominal:     General: Abdomen is flat. There is no distension.     Palpations: Abdomen is soft. There is no mass.     Tenderness: There is abdominal tenderness (Lower abdomen). There is no guarding.  Musculoskeletal:     Cervical back: Normal range of motion and neck supple.     Right lower leg: Edema present.     Left lower leg: Edema present.  Skin:    General: Skin is warm and dry.  Neurological:     Mental Status: She is alert and oriented to person, place, and time. Mental status is at baseline.  Psychiatric:        Mood and Affect: Mood normal.     ED Results / Procedures / Treatments   Labs (all labs ordered are listed, but only abnormal results are displayed) Labs Reviewed  CULTURE, BLOOD (ROUTINE X 2)  CULTURE, BLOOD (ROUTINE X 2)  COMPREHENSIVE METABOLIC PANEL  LACTIC ACID, PLASMA  LACTIC ACID, PLASMA  CBC WITH DIFFERENTIAL/PLATELET  PROTIME-INR  URINALYSIS, W/ REFLEX TO CULTURE (INFECTION SUSPECTED)    EKG EKG Interpretation Date/Time:  Wednesday July 31 2023 20:18:05 EDT Ventricular  Rate:  145 PR Interval:    QRS Duration:  134 QT Interval:  327 QTC Calculation: 522 R Axis:   -79  Text Interpretation: Wide-QRS tachycardia Ventricular premature complex Right bundle branch block Confirmed by Vonita Moss [id number] on 07/31/2023 8:29:20 PM  Radiology No results found.  Procedures Procedures  {Document cardiac monitor, telemetry assessment procedure when appropriate:1}  Medications Ordered in ED Medications - No data to display  ED Course/ Medical Decision Making/ A&P   {  Click here for ABCD2, HEART and other calculatorsREFRESH Note before signing :1}                              Medical Decision Making Amount and/or Complexity of Data Reviewed Labs: ordered. Radiology: ordered.   ***  {Document critical care time when appropriate:1} {Document review of labs and clinical decision tools ie heart score, Chads2Vasc2 etc:1}  {Document your independent review of radiology images, and any outside records:1} {Document your discussion with family members, caretakers, and with consultants:1} {Document social determinants of health affecting pt's care:1} {Document your decision making why or why not admission, treatments were needed:1} Final Clinical Impression(s) / ED Diagnoses Final diagnoses:  None    Rx / DC Orders ED Discharge Orders     None

## 2023-07-31 NOTE — ED Notes (Signed)
Temp 99.2 orally in triage, pt feels febrile on palpation, will recheck

## 2023-07-31 NOTE — ED Notes (Signed)
Patient transported to CT, escorted by RN and CT [employer]

## 2023-07-31 NOTE — ED Notes (Signed)
Escorted pt to CT. Pt tolerated scan well. A/ox4 resting in bed eating ice chips. Oriented to call light and family at beside.

## 2023-07-31 NOTE — ED Notes (Signed)
RT called to triage. SpO2 on Room Air 78-81%. Pt promptly roomed and placed on 5LNC. SpO2 94% after application of oxygen.

## 2023-08-01 ENCOUNTER — Emergency Department (HOSPITAL_BASED_OUTPATIENT_CLINIC_OR_DEPARTMENT_OTHER): Payer: Medicare Other

## 2023-08-01 DIAGNOSIS — T17908A Unspecified foreign body in respiratory tract, part unspecified causing other injury, initial encounter: Secondary | ICD-10-CM | POA: Diagnosis not present

## 2023-08-01 DIAGNOSIS — E1122 Type 2 diabetes mellitus with diabetic chronic kidney disease: Secondary | ICD-10-CM | POA: Diagnosis present

## 2023-08-01 DIAGNOSIS — E785 Hyperlipidemia, unspecified: Secondary | ICD-10-CM | POA: Diagnosis present

## 2023-08-01 DIAGNOSIS — K565 Intestinal adhesions [bands], unspecified as to partial versus complete obstruction: Secondary | ICD-10-CM | POA: Diagnosis present

## 2023-08-01 DIAGNOSIS — R1030 Lower abdominal pain, unspecified: Secondary | ICD-10-CM | POA: Diagnosis present

## 2023-08-01 DIAGNOSIS — Z794 Long term (current) use of insulin: Secondary | ICD-10-CM | POA: Diagnosis not present

## 2023-08-01 DIAGNOSIS — I517 Cardiomegaly: Secondary | ICD-10-CM | POA: Diagnosis not present

## 2023-08-01 DIAGNOSIS — J9601 Acute respiratory failure with hypoxia: Secondary | ICD-10-CM | POA: Diagnosis present

## 2023-08-01 DIAGNOSIS — J690 Pneumonitis due to inhalation of food and vomit: Secondary | ICD-10-CM | POA: Diagnosis present

## 2023-08-01 DIAGNOSIS — Z8542 Personal history of malignant neoplasm of other parts of uterus: Secondary | ICD-10-CM | POA: Diagnosis not present

## 2023-08-01 DIAGNOSIS — C541 Malignant neoplasm of endometrium: Secondary | ICD-10-CM | POA: Diagnosis not present

## 2023-08-01 DIAGNOSIS — E782 Mixed hyperlipidemia: Secondary | ICD-10-CM | POA: Diagnosis not present

## 2023-08-01 DIAGNOSIS — I480 Paroxysmal atrial fibrillation: Secondary | ICD-10-CM | POA: Diagnosis present

## 2023-08-01 DIAGNOSIS — K56609 Unspecified intestinal obstruction, unspecified as to partial versus complete obstruction: Secondary | ICD-10-CM | POA: Diagnosis not present

## 2023-08-01 DIAGNOSIS — I10 Essential (primary) hypertension: Secondary | ICD-10-CM

## 2023-08-01 DIAGNOSIS — E118 Type 2 diabetes mellitus with unspecified complications: Secondary | ICD-10-CM

## 2023-08-01 DIAGNOSIS — I4891 Unspecified atrial fibrillation: Secondary | ICD-10-CM | POA: Diagnosis not present

## 2023-08-01 DIAGNOSIS — R918 Other nonspecific abnormal finding of lung field: Secondary | ICD-10-CM | POA: Diagnosis not present

## 2023-08-01 DIAGNOSIS — N736 Female pelvic peritoneal adhesions (postinfective): Secondary | ICD-10-CM | POA: Diagnosis present

## 2023-08-01 DIAGNOSIS — N183 Chronic kidney disease, stage 3 unspecified: Secondary | ICD-10-CM | POA: Diagnosis present

## 2023-08-01 DIAGNOSIS — R5381 Other malaise: Secondary | ICD-10-CM | POA: Diagnosis present

## 2023-08-01 DIAGNOSIS — Z85828 Personal history of other malignant neoplasm of skin: Secondary | ICD-10-CM | POA: Diagnosis not present

## 2023-08-01 DIAGNOSIS — Z87891 Personal history of nicotine dependence: Secondary | ICD-10-CM | POA: Diagnosis not present

## 2023-08-01 DIAGNOSIS — Z4682 Encounter for fitting and adjustment of non-vascular catheter: Secondary | ICD-10-CM | POA: Diagnosis not present

## 2023-08-01 DIAGNOSIS — Z993 Dependence on wheelchair: Secondary | ICD-10-CM | POA: Diagnosis not present

## 2023-08-01 DIAGNOSIS — M109 Gout, unspecified: Secondary | ICD-10-CM | POA: Diagnosis present

## 2023-08-01 DIAGNOSIS — T501X5A Adverse effect of loop [high-ceiling] diuretics, initial encounter: Secondary | ICD-10-CM | POA: Diagnosis present

## 2023-08-01 DIAGNOSIS — Z6841 Body Mass Index (BMI) 40.0 and over, adult: Secondary | ICD-10-CM | POA: Diagnosis not present

## 2023-08-01 DIAGNOSIS — K219 Gastro-esophageal reflux disease without esophagitis: Secondary | ICD-10-CM | POA: Diagnosis present

## 2023-08-01 DIAGNOSIS — I700 Atherosclerosis of aorta: Secondary | ICD-10-CM | POA: Diagnosis not present

## 2023-08-01 DIAGNOSIS — I130 Hypertensive heart and chronic kidney disease with heart failure and stage 1 through stage 4 chronic kidney disease, or unspecified chronic kidney disease: Secondary | ICD-10-CM | POA: Diagnosis present

## 2023-08-01 DIAGNOSIS — I5033 Acute on chronic diastolic (congestive) heart failure: Secondary | ICD-10-CM | POA: Diagnosis present

## 2023-08-01 DIAGNOSIS — N179 Acute kidney failure, unspecified: Secondary | ICD-10-CM | POA: Diagnosis present

## 2023-08-01 DIAGNOSIS — E860 Dehydration: Secondary | ICD-10-CM | POA: Diagnosis present

## 2023-08-01 DIAGNOSIS — E876 Hypokalemia: Secondary | ICD-10-CM | POA: Diagnosis present

## 2023-08-01 DIAGNOSIS — R0602 Shortness of breath: Secondary | ICD-10-CM | POA: Diagnosis not present

## 2023-08-01 DIAGNOSIS — K5669 Other partial intestinal obstruction: Secondary | ICD-10-CM | POA: Diagnosis not present

## 2023-08-01 LAB — COMPREHENSIVE METABOLIC PANEL
ALT: 15 U/L (ref 0–44)
AST: 17 U/L (ref 15–41)
Albumin: 3.1 g/dL — ABNORMAL LOW (ref 3.5–5.0)
Alkaline Phosphatase: 86 U/L (ref 38–126)
Anion gap: 12 (ref 5–15)
BUN: 38 mg/dL — ABNORMAL HIGH (ref 8–23)
CO2: 29 mmol/L (ref 22–32)
Calcium: 8.5 mg/dL — ABNORMAL LOW (ref 8.9–10.3)
Chloride: 101 mmol/L (ref 98–111)
Creatinine, Ser: 1.53 mg/dL — ABNORMAL HIGH (ref 0.44–1.00)
GFR, Estimated: 35 mL/min — ABNORMAL LOW (ref >60)
Glucose, Bld: 195 mg/dL — ABNORMAL HIGH (ref 70–99)
Potassium: 4.1 mmol/L (ref 3.5–5.1)
Sodium: 142 mmol/L (ref 135–145)
Total Bilirubin: 1.1 mg/dL (ref 0.3–1.2)
Total Protein: 6.1 g/dL — ABNORMAL LOW (ref 6.5–8.1)

## 2023-08-01 LAB — CBC
HCT: 36.1 % (ref 36.0–46.0)
Hemoglobin: 11.4 g/dL — ABNORMAL LOW (ref 12.0–15.0)
MCH: 33.4 pg (ref 26.0–34.0)
MCHC: 31.6 g/dL (ref 30.0–36.0)
MCV: 105.9 fL — ABNORMAL HIGH (ref 80.0–100.0)
Platelets: 157 10*3/uL (ref 150–400)
RBC: 3.41 MIL/uL — ABNORMAL LOW (ref 3.87–5.11)
RDW: 15.3 % (ref 11.5–15.5)
WBC: 10.7 10*3/uL — ABNORMAL HIGH (ref 4.0–10.5)
nRBC: 0 % (ref 0.0–0.2)

## 2023-08-01 LAB — LACTIC ACID, PLASMA: Lactic Acid, Venous: 2.3 mmol/L (ref 0.5–1.9)

## 2023-08-01 LAB — HEMOGLOBIN A1C
Hgb A1c MFr Bld: 5.9 % — ABNORMAL HIGH (ref 4.8–5.6)
Mean Plasma Glucose: 122.63 mg/dL

## 2023-08-01 LAB — GLUCOSE, CAPILLARY
Glucose-Capillary: 188 mg/dL — ABNORMAL HIGH (ref 70–99)
Glucose-Capillary: 176 mg/dL — ABNORMAL HIGH (ref 70–99)

## 2023-08-01 MED ORDER — INSULIN ASPART 100 UNIT/ML IJ SOLN
0.0000 [IU] | INTRAMUSCULAR | Status: DC
  Administered 2023-08-01 (×2): 3 [IU] via SUBCUTANEOUS
  Administered 2023-08-02: 5 [IU] via SUBCUTANEOUS
  Administered 2023-08-02 (×2): 3 [IU] via SUBCUTANEOUS
  Administered 2023-08-02: 5 [IU] via SUBCUTANEOUS
  Administered 2023-08-02 – 2023-08-03 (×4): 3 [IU] via SUBCUTANEOUS
  Administered 2023-08-03: 5 [IU] via SUBCUTANEOUS
  Administered 2023-08-03: 8 [IU] via SUBCUTANEOUS
  Administered 2023-08-03 – 2023-08-04 (×4): 3 [IU] via SUBCUTANEOUS
  Administered 2023-08-04 (×2): 5 [IU] via SUBCUTANEOUS
  Administered 2023-08-05: 2 [IU] via SUBCUTANEOUS

## 2023-08-01 MED ORDER — ACETAMINOPHEN 325 MG PO TABS
650.0000 mg | ORAL_TABLET | Freq: Four times a day (QID) | ORAL | Status: DC | PRN
  Administered 2023-08-04: 650 mg
  Filled 2023-08-01: qty 2

## 2023-08-01 MED ORDER — DIATRIZOATE MEGLUMINE & SODIUM 66-10 % PO SOLN
90.0000 mL | Freq: Once | ORAL | Status: AC
  Administered 2023-08-01: 90 mL via NASOGASTRIC
  Filled 2023-08-01: qty 90

## 2023-08-01 MED ORDER — SODIUM CHLORIDE 0.9% FLUSH
3.0000 mL | Freq: Two times a day (BID) | INTRAVENOUS | Status: DC
  Administered 2023-08-01 – 2023-08-06 (×8): 3 mL via INTRAVENOUS

## 2023-08-01 MED ORDER — ACETAMINOPHEN 650 MG RE SUPP: 650.0000 mg | Freq: Four times a day (QID) | RECTAL | Status: DC | PRN

## 2023-08-01 MED ORDER — SODIUM CHLORIDE 0.9 % IV SOLN
3.0000 g | Freq: Three times a day (TID) | INTRAVENOUS | Status: DC
  Administered 2023-08-01 – 2023-08-06 (×16): 3 g via INTRAVENOUS
  Filled 2023-08-01 (×16): qty 8

## 2023-08-01 MED ORDER — INSULIN ASPART PROT & ASPART (70-30 MIX) 100 UNIT/ML ~~LOC~~ SUSP
20.0000 [IU] | Freq: Two times a day (BID) | SUBCUTANEOUS | Status: DC
  Administered 2023-08-03 – 2023-08-06 (×7): 20 [IU] via SUBCUTANEOUS
  Filled 2023-08-01 (×2): qty 10

## 2023-08-01 NOTE — Progress Notes (Signed)
Pharmacy Antibiotic Note  [name] is a 73 y.o. female admitted on 07/31/2023 with aspiration pneumonia.  Pharmacy has been consulted for Unasyn dosing.   CBC stable, afebrile, Scr elevated at 1.69 from BL ~ 0.9. CKD 3.  Plan: Initiate Unasyn 3g Q8H Monitor renal function, clinical status, blood cultures  Height: 5\' 4"  (162.6 cm) Weight: (!) 140.5 kg (309 lb 11.9 oz) IBW/kg (Calculated) : 54.7  Temp (24hrs), Avg:98.8 F (37.1 C), Min:98.4 F (36.9 C), Max:99.2 F (37.3 C)  Recent Labs  Lab 07/31/23 2024 07/31/23 2210 08/01/23 1133  WBC 9.5  --   --   CREATININE 1.69*  --   --   LATICACIDVEN 2.8* 2.2* 2.3*    Estimated Creatinine Clearance: 41 mL/min (A) (by C-G formula based on SCr of 1.69 mg/dL (H)).    Allergies  Allergen Reactions   Lisinopril Cough    Other reaction(s): cough, Not available   Actos [Pioglitazone]     Unknown reaction   Metformin Other (See Comments)   Metformin And Related     Gas     Antimicrobials this admission: CTX x 1 on 7/31 pm Azithro IV x1 on 8/1 Unasyn 8/1 >>  Microbiology results: 7/31 BCx: ng < 12 hrs to date  Thank you for allowing pharmacy to be a part of this patient's care.  Cleopatra Sardo 08/01/2023 2:06 PM

## 2023-08-01 NOTE — ED Notes (Signed)
Handoff report given to RN Arline Asp at [facility]

## 2023-08-01 NOTE — H&P (Signed)
History and Physical   Shaynah Terzian ZOX:[id number] DOB: [birth year] DOA: 07/31/2023  PCP: Jeoffrey Massed, MD   Patient coming from: Home  Chief Complaint: Abdominal pain, nausea, vomiting  HPI: [name] is a 73 y.o. female with medical history significant of hypertension, hyperlipidemia, diabetes, atrial fibrillation, diverticulitis with history of rupture status post colostomy and pouch with reversal, abdominal adhesive disease, SBO, (endometrial cancer status post biopsy, D&C, IUD, radiation now on surveillance) presenting with ongoing abdominal pain, nausea and vomiting.  Patient reports ongoing mild abdominal pain with significant nausea and vomiting which has been recurrent for the past 2 days.  The night prior to presentation was with the abdominal pain started.  Emesis has been dark but nonbloody and nonbilious.  Last bowel movement was 3 to 4 days ago.  Does not believe she is passing gas the day she presented.  During one of her episodes of nausea vomiting the day she presented to the ED she states that she passed out and afterwards noted gurgling noises and has had some shortness of breath and cough since then.  Complicated abdominal surgical history due to the above diverticulitis and rupture status post colectomy and pouch with reversal.  Upon reversal encountered extensive adhesive disease and patient had a 1 month hospital stay following this.  Also due to adhesive disease robotic assisted endometrial carcinoma hysterectomy had to be aborted in favor of IUD placement and I&D (on surveillance for the endometrial cancer now.)  She denies fevers, chills, chest pain.  ED Course: Vital signs in the ED notable for blood pressure in the 100s to 130s systolic, heart rate in the 80s to 120s (did have transient episode of RVR which improved with IV fluids).  In the ED also requiring 3 to 4 L to maintain saturations in the setting of aspiration as above.  Lab workup showed BMP with creatinine  elevated to 1.69 from baseline 1.3, BUN 29, glucose 181.  CBC within normal limits.  PT and INR normal.  Lactic acid improving at 2.8, 2.2, 2.3.  BNP borderline at 379.  Urinalysis and blood culture pending.  Chest x-ray showed moderate interstitial markings consistent with edema.  CTA PE study showed no evidence of PE but did show multifocal groundglass opacities favoring infectious versus inflammatory etiology.  CT abdomen pelvis showed changes consistent with small bowel obstruction, no free air noted, some mesenteric edema was noted, only small pelvic free fluid.  Subsequent abdominal x-ray confirmed NG tube placement.  Patient received ceftriaxone, azithromycin, Zofran, 500 cc IV fluids and started on 100 cc an hour of IV fluids in the ED.  General surgery consulted and will be seeing the patient today.  Review of Systems: As per HPI otherwise all other systems reviewed and are negative.  Past Medical History:  Diagnosis Date   Atrial fibrillation (HCC)    persistent (outpt monitoring 08/2022)   Bilateral lower extremity edema    Carpal tunnel syndrome    bilateral, had surgery on right hand   Chronic renal insufficiency, stage 3 (moderate) (HCC)    GFR 50s   Diabetes mellitus without complication (HCC)    Dr. Sharl Ma   Diverticulosis    +hx of 'itis   Endometrial cancer [facility])    RT spring 2024   GERD (gastroesophageal reflux disease)    Gout    No problems after getting on allopurinol   Heart murmur    soft, systolic   History of blood transfusion    History of colon  polyps    History of iron deficiency    Hyperlipidemia    Hypertension    Osteoarthritis, multiple sites    esp knees   Urinary incontinence     Past Surgical History:  Procedure Laterality Date   CARPAL TUNNEL RELEASE Right 1991   COLON SURGERY     COLONOSCOPY     x2.  Last was 2018   COLOSTOMY     COLOSTOMY REVERSAL     DILATATION & CURETTAGE/HYSTEROSCOPY WITH MYOSURE N/A 07/17/2019   Procedure: DILATATION  & CURETTAGE/HYSTEROSCOPY WITH MYOSURE;  Surgeon: Candice Camp, MD;  Location: [facility];  Service: Gynecology;  Laterality: N/A;   OOPHORECTOMY  2000   unilateral   PILONIDAL CYST EXCISION     ROBOTIC ASSISTED TOTAL HYSTERECTOMY WITH BILATERAL SALPINGO OOPHERECTOMY N/A 08/20/2019   Procedure: DIAGNSOTIC LAPAROSCOPY, DILATION AND CURETTAGE, IUD PLACEMENT ;  Surgeon: Adolphus Birchwood, MD;  Location: [facility];  Service: Gynecology;  Laterality: N/A;   SKIN CANCER EXCISION     TRANSTHORACIC ECHOCARDIOGRAM     08/2022 EF 50-55%, inc PA pressure, nl RV fxn, valves ok    Social History  reports that she quit smoking about 36 years ago. Her smoking use included cigarettes. She started smoking about 58 years ago. She has a 66 pack-year smoking history. She has never used smokeless tobacco. She reports current alcohol use. She reports that she does not use drugs.  Allergies  Allergen Reactions   Lisinopril Cough    Other reaction(s): cough, Not available   Actos [Pioglitazone]     Unknown reaction   Metformin Other (See Comments)   Metformin And Related     Gas     Family History  Problem Relation Age of Onset   Hypertension Mother    Skin cancer Father    Diabetes Sister    Cancer Sister    Hypertension Sister    Cancer Sister    Diabetes Sister    Hearing loss Sister    Hypertension Sister    Kidney disease Sister    Cancer Sister    Diabetes Brother    Diabetes Maternal Grandfather    Stroke Maternal Grandfather    Diabetes Paternal Grandmother    Hearing loss Daughter   Reviewed on admission  Prior to Admission medications   Medication Sig Start Date End Date Taking? Authorizing Provider  Acetaminophen (TYLENOL PO) Take 1,000 mg by mouth 3 (three) times daily.   Yes [provider]  allopurinol (ZYLOPRIM) 300 MG tablet Take 1 tablet (300 mg total) by mouth daily. 06/17/23  Yes McGowen, Maryjean Morn, MD  apixaban (ELIQUIS) 5 MG TABS tablet Take 1 tablet (5 mg  total) by mouth 2 (two) times daily. 09/10/22  Yes Pricilla Riffle, MD  atorvastatin (LIPITOR) 80 MG tablet Take 1 tablet (80 mg total) by mouth at bedtime. 08/07/22  Yes Medina-Vargas, Monina C, NP  carvedilol (COREG) 12.5 MG tablet Take 1 tablet (12.5 mg total) by mouth 2 (two) times daily with a meal. 09/28/22  Yes Pricilla Riffle, MD  Cholecalciferol (VITAMIN D3 PO) Take 1 capsule by mouth every evening.    Yes [provider]  Coenzyme Q10 (COQ10) 100 MG CAPS Take 100 mg by mouth every evening.   Yes [provider]  famotidine (PEPCID) 20 MG tablet Take 20 mg by mouth daily.    Yes [provider]  furosemide (LASIX) 40 MG tablet Take 2 tablets (80 mg) in the morning. 11/29/22  Yes Pricilla Riffle, MD  GLUCOSAMINE-CHONDROITIN PO Take 1 tablet by mouth 2 (two) times daily.   Yes [provider]  insulin NPH-regular Human (70-30) 100 UNIT/ML injection Inject into the skin daily. 60 units In the morning and 40 units in the evening   Yes [provider]  KRILL OIL PO Take 1 capsule by mouth daily.   Yes [provider]  losartan (COZAAR) 50 MG tablet Take 1 tablet (50 mg total) by mouth daily. 05/29/23  Yes Pricilla Riffle, MD  meclizine (ANTIVERT) 12.5 MG tablet Take 1 tablet (12.5 mg total) by mouth 3 (three) times daily as needed for dizziness. 07/07/22  Yes Wallis Bamberg, PA-C  MELATONIN PO Take 10 mg by mouth at bedtime.   Yes [provider]  Multiple Vitamins-Minerals (MULTIVITAMIN WITH MINERALS) tablet Take 1 tablet by mouth daily.   Yes [provider]  potassium chloride (KLOR-CON) 10 MEQ tablet Take 1 tablet (10 mEq total) by mouth 2 (two) times daily. 11/13/22  Yes Pricilla Riffle, MD  Vibegron (GEMTESA) 75 MG TABS Take 1 tablet (75 mg total) by mouth daily. 05/09/23  Yes McGowen, Maryjean Morn, MD  amLODipine (NORVASC) 5 MG tablet Take 1 tablet (5 mg total) by mouth daily. 06/17/23   Jeoffrey Massed, MD    Physical Exam: Vitals:    08/01/23 0800 08/01/23 0900 08/01/23 1000 08/01/23 1110  BP: (!) 107/57 (!) 133/118 (!) 103/57 134/69  Pulse: (!) 128 (!) 102 94 (!) 113  Resp: 16 (!) 22 17 19   Temp:      TempSrc:      SpO2: (!) 86% 95% 95% 96%  Weight:      Height:        Physical Exam Constitutional:      General: She is not in acute distress.    Appearance: Normal appearance. She is obese.  HENT:     Head: Normocephalic and atraumatic.     Mouth/Throat:     Mouth: Mucous membranes are moist.     Pharynx: Oropharynx is clear.  Eyes:     Extraocular Movements: Extraocular movements intact.     Pupils: Pupils are equal, round, and reactive to light.  Cardiovascular:     Rate and Rhythm: Tachycardia present. Rhythm irregular.     Pulses: Normal pulses.     Heart sounds: Normal heart sounds.     Comments: Distant sounds Pulmonary:     Effort: Pulmonary effort is normal. No respiratory distress.     Breath sounds: Normal breath sounds.     Comments: Distant sounds Abdominal:     General: Bowel sounds are normal. There is no distension.     Palpations: Abdomen is soft.     Tenderness: There is abdominal tenderness.  Musculoskeletal:        General: No swelling or deformity.     Right lower leg: Edema (trace) present.     Left lower leg: Edema (trace) present.  Skin:    General: Skin is warm and dry.  Neurological:     General: No focal deficit present.     Mental Status: Mental status is at baseline.     Labs on Admission: I have personally reviewed following labs and imaging studies  CBC: Recent Labs  Lab 07/31/23 2024  WBC 9.5  NEUTROABS 8.5*  HGB 13.3  HCT 40.3  MCV 101.0*  PLT 242    Basic Metabolic Panel: Recent Labs  Lab 07/31/23 2024  NA 141  K 5.0  CL 102  CO2 26  GLUCOSE 181*  BUN 29*  CREATININE 1.69*  CALCIUM 9.9    GFR: Estimated Creatinine Clearance: 41 mL/min (A) (by C-G formula based on SCr of 1.69 mg/dL (H)).  Liver Function Tests: Recent Labs  Lab  07/31/23 2024  AST 40  ALT 13  ALKPHOS 85  BILITOT 1.1  PROT 7.0  ALBUMIN 4.0    Urine analysis:    Component Value Date/Time   COLORURINE YELLOW 08/17/2019 1215   APPEARANCEUR CLEAR 08/17/2019 1215   LABSPEC 1.012 08/17/2019 1215   PHURINE 5.0 08/17/2019 1215   GLUCOSEU NEGATIVE 08/17/2019 1215   HGBUR NEGATIVE 08/17/2019 1215   BILIRUBINUR NEGATIVE 08/17/2019 1215   KETONESUR NEGATIVE 08/17/2019 1215   PROTEINUR NEGATIVE 08/17/2019 1215   NITRITE NEGATIVE 08/17/2019 1215   LEUKOCYTESUR SMALL (A) 08/17/2019 1215    Radiological Exams on Admission: DG Abd Portable 1 View  Result Date: 08/01/2023 CLINICAL DATA:  NG tube placement. EXAM: PORTABLE ABDOMEN - 1 VIEW COMPARISON:  06/08/2016. FINDINGS: The bowel gas pattern is normal field-of-view limited to the upper abdomen. An enteric tube terminates in the stomach. The heart is enlarged and atherosclerotic calcification of the aorta is noted. IMPRESSION: An enteric tube terminates in the stomach. Electronically Signed   By: Thornell Sartorius M.D.   On: 08/01/2023 00:54   CT ABDOMEN PELVIS W CONTRAST  Result Date: 07/31/2023 CLINICAL DATA:  Nausea and vomiting.  History of adhesions. EXAM: CT ABDOMEN AND PELVIS WITH CONTRAST TECHNIQUE: Multidetector CT imaging of the abdomen and pelvis was performed using the standard protocol following bolus administration of intravenous contrast. RADIATION DOSE REDUCTION: This exam was performed according to the departmental dose-optimization program which includes automated exposure control, adjustment of the mA and/or kV according to patient size and/or use of iterative reconstruction technique. CONTRAST:  75mL OMNIPAQUE IOHEXOL 350 MG/ML SOLN COMPARISON:  CT abdomen and pelvis 06/07/2016 FINDINGS: Lower chest: Multifocal airspace disease is present in the lower lungs bilaterally. Hepatobiliary: Single large gallstone present measuring 4.2 cm. No pericholecystic inflammation or biliary ductal dilatation.  The liver is within normal limits. Pancreas: Unremarkable. No pancreatic ductal dilatation or surrounding inflammatory changes. Spleen: Normal in size without focal abnormality. Adrenals/Urinary Tract: There is diffuse bladder wall thickening with surrounding inflammation. Stomach/Bowel: Sigmoid colon anastomosis is present. The colon is nondilated. The appendix is not seen. There are dilated mid and proximal small bowel loops with air-fluid levels measuring up to 5 cm. The stomach is moderately distended with air-fluid level. Transition point is seen at a level of the anastomosis in the right lower quadrant coronal image 5/81. Distal small bowel is decompressed. There is mild mesenteric edema involving dilated small bowel loops. No pneumatosis or free air. Vascular/Lymphatic: Aortic atherosclerosis. No enlarged abdominal or pelvic lymph nodes. Reproductive: There is a 3.6 cm cyst in the left ovary which has minimally increased in size compared to 2017. The uterus and right ovary are within normal limits. Other: There is a small amount of free fluid in the pelvis. Multiple small bowel loops approximate the anterior abdominal wall likely related to adhesive disease similar to the prior study. There is no focal abdominal wall hernia. Musculoskeletal: Multilevel degenerative changes affect the spine. IMPRESSION: 1. Findings compatible with small bowel obstruction with transition point in the right lower quadrant at the level of the anastomosis. There is mild mesenteric edema. No pneumatosis or free air. 2. Diffuse bladder wall thickening with surrounding inflammation worrisome for cystitis. 3. Small  amount of free fluid in the pelvis. 4. Cholelithiasis. 5. 3.6 cm left ovarian simple-appearing cyst. Recommend follow-up ultrasound in 6-12 months to confirm stability. 6. Aortic atherosclerosis. Aortic Atherosclerosis (ICD10-I70.0). These results were called by telephone at the time of interpretation on 07/31/2023 at 11:43  pm to provider Vonita Moss , who verbally acknowledged these results. Electronically Signed   By: Darliss Cheney M.D.   On: 07/31/2023 23:44   CT Angio Chest PE W and/or Wo Contrast  Result Date: 07/31/2023 CLINICAL DATA:  Syncope and emesis. Fall. History of endometrial cancer status post brachytherapy. EXAM: CT ANGIOGRAPHY CHEST WITH CONTRAST TECHNIQUE: Multidetector CT imaging of the chest was performed using the standard protocol during bolus administration of intravenous contrast. Multiplanar CT image reconstructions and MIPs were obtained to evaluate the vascular anatomy. RADIATION DOSE REDUCTION: This exam was performed according to the departmental dose-optimization program which includes automated exposure control, adjustment of the mA and/or kV according to patient size and/or use of iterative reconstruction technique. CONTRAST:  75mL OMNIPAQUE IOHEXOL 350 MG/ML SOLN COMPARISON:  None Available. FINDINGS: Cardiovascular: Satisfactory opacification of the pulmonary arteries to the segmental level. No evidence of pulmonary embolism. Normal heart size. No pericardial effusion. There are atherosclerotic calcifications of the aorta. Mediastinum/Nodes: No enlarged mediastinal, hilar, or axillary lymph nodes. Thyroid gland, trachea, and esophagus demonstrate no significant findings. Lungs/Pleura: Multifocal airspace and ground-glass opacities with slightly nodular component are seen in the bilateral lower lobes right middle lobe and left upper lobe. This is most significant in the left lower lobe. There is no pleural effusion or pneumothorax. Upper Abdomen: No acute abnormality. Musculoskeletal: No chest wall abnormality. No acute or significant osseous findings. Review of the MIP images confirms the above findings. IMPRESSION: 1. No evidence for pulmonary embolism. 2. Multifocal airspace and ground-glass opacities with slightly nodular component are seen in the bilateral lower lobes, right middle lobe and  left upper lobe. This is most significant in the left lower lobe. Findings are favored as infectious/inflammatory. 3. Aortic atherosclerosis. Aortic Atherosclerosis (ICD10-I70.0). Electronically Signed   By: Darliss Cheney M.D.   On: 07/31/2023 22:35   DG Chest Portable 1 View  Result Date: 07/31/2023 CLINICAL DATA:  Vomiting and syncope. EXAM: PORTABLE CHEST 1 VIEW COMPARISON:  July 23, 2022 FINDINGS: The cardiac silhouette is mildly enlarged and unchanged in size. There is marked severity calcification of the aortic arch. Moderate severity diffusely increased interstitial lung markings are seen. Mild atelectasis and/or infiltrate is noted within the bilateral lung bases. No pleural effusion or pneumothorax are identified. Multilevel degenerative changes seen throughout the thoracic spine. IMPRESSION: 1. Moderate severity diffusely increased interstitial lung markings which may represent mild interstitial edema. 2. Mild bibasilar atelectasis and/or infiltrate. Electronically Signed   By: Aram Candela M.D.   On: 07/31/2023 22:01    EKG: Independently reviewed.  A-fib with RVR at 145 bpm, multiple PVCs.  Right bundle branch block.  Assessment/Plan Principal Problem:   SBO (small bowel obstruction) (HCC) Active Problems:   Diabetes mellitus with complication (HCC)   HLD (hyperlipidemia)   Essential hypertension   Endometrial cancer (HCC)   Morbid (severe) obesity due to excess calories (HCC)   A-fib (HCC)   Aspiration into airway   Acute respiratory failure with hypoxia (HCC)   SBO Abdominal adhesive disease > Patient presented with ongoing nausea vomiting and mild abdominal pain.  Found to have SBO on abdominal imaging. > Per HPI his history of his extensive abdominal adhesive disease which is cause complications  and extended time required for prior surgery and extended hospital stays. > General surgery consulted and are following.  NG tube placed in ED. - Monitor on progressive -  Appreciate general surgery recommendations and assistance - Will be on Unasyn for aspiration as below - NG tube in place - N.p.o. - Anticipate small bowel protocol - Hold off on further IV fluids pending repeat labs  Aspiration Hypoxic respiratory failure > Had aspiration event during episode of nausea and vomiting because she passed out during the episode.  Noted gurgling and shortness of breath afterwards. > Was requiring 3 to 4 L to maintain saturation in the ED.  Started on ceftriaxone and azithromycin. > Initial concern for edema on chest x-ray with CT favored inflammation versus infection.  No leukocytosis yet. - Monitoring on progressive as above - Continue supplemental oxygen, wean as tolerated - Transition antibiotics to Unasyn for clear significant aspiration event - Trend fever curve and WBC  AKI > Creatinine elevated to 1.69 from baseline 1.3 in the ED. > Does have mildly elevated BNP but no diagnosis of CHF and previous echocardiogram normal with indeterminant diastolic function.  However is on Lasix at baseline which could be masking some underlying mild CHF. > Did receive 500 cc bolus and then additional IV fluids in ED, will monitor response of this. - Repeat CMP now further IV fluids versus diuresis based on this result - Trend renal function and electrolytes  Hypertension - Holding home amlodipine, Coreg, losartan while n.p.o.  Hyperlipidemia - Holding home atorvastatin while n.p.o.  Diabetes > 70/30 insulin outpatient 16 a.m. 40 in the p.m. - 70/30 insulin 20 units twice daily - SSI  Atrial fibrillation > Some accelerated heart rates in the ED improved with IV fluids. - Holding home carvedilol and Eliquis while n.p.o. and also while monitoring for possible surgical needs - As needed metoprolol for sustained heart rates greater than 120 (especially if workup above prohibits significant fluid administration)  Endometrial cancer > As above has history of  endometrial cancer.  Attempted surgical resection and had to be aborted due to significant adhesions.  D&C performed and IUD placed and biopsies confirmed diagnosis.  Patient later received definitive radiotherapy and has been under surveillance by Gyn Onc.  DVT prophylaxis: SCDs Code Status:   Full Family Communication:  None on admission  Disposition Plan:   Patient is from:  Home  Anticipated DC to:  Home  Anticipated DC date:  2 to 7 days  Anticipated DC barriers: None  Consults called:  General surgery Admission status:  Inpatient, progressive  Severity of Illness: The appropriate patient status for this patient is INPATIENT. Inpatient status is judged to be reasonable and necessary in order to provide the required intensity of service to ensure the patient's safety. [name] presenting symptoms, physical exam findings, and initial radiographic and laboratory data in the context of their chronic comorbidities is felt to place them at high risk for further clinical deterioration. Furthermore, it is not anticipated that the patient will be medically stable for discharge from the hospital within 2 midnights of admission.   * I certify that at the point of admission it is my clinical judgment that the patient will require inpatient hospital care spanning beyond 2 midnights from the point of admission due to high intensity of service, high risk for further deterioration and high frequency of surveillance required.Synetta Fail MD [facility]  How to contact the [facility] Attending or Consulting provider 7A -  7P or covering provider during after hours 7P -7A, for this patient?   Check the care team in [facility] and look for a) attending/consulting [facility] provider listed and b) the [facility] team listed Log into [email or web address] and use [place]'s universal password to access. If you do not have the password, please contact the hospital operator. Locate the [facility] provider you are looking for  under [facility] and page to a number that you can be directly reached. If you still have difficulty reaching the provider, please page the [facility] (Director on Call) for the Hospitalists listed on amion for assistance.  08/01/2023, 2:18 PM

## 2023-08-01 NOTE — Progress Notes (Signed)
Plan of Care Note for accepted transfer   Patient: [name] MRN: [id number]   DOA: 07/31/2023  Facility requesting transfer: [facility] Requesting Provider: Dr. Read Drivers Reason for transfer: Sepsis and acute hypoxemic respiratory failure secondary to suspected aspiration pneumonia, SBO Facility course: 73 year old female with past medical history of A-fib on Eliquis, CKD stage III, insulin-dependent diabetes, endometrial cancer status post TAH-BSO in 2020, GERD, gout, hyperlipidemia, hypertension, osteoarthritis presented to the ED complaining of lower abdominal pain, nausea, and vomiting.  Also concern for possible aspiration due to an episode of syncope this morning and patient complaining of shortness of breath and cough.  Does not use supplemental oxygen at home.  On arrival to the ED, patient was afebrile but tachycardic and tachypneic. Oxygen saturation 78-81% on room air, initially required 5 L [place] but now requiring 3 L [place] to maintain sats in the 90s. Labs showing no leukocytosis, glucose 181, BUN 29, creatinine 1.6 (baseline 1.1-1.4), BNP 379, UA pending, lactic acid 2.8> 2.2, blood cultures drawn.  CTA chest showing: "IMPRESSION: 1. No evidence for pulmonary embolism. 2. Multifocal airspace and ground-glass opacities with slightly nodular component are seen in the bilateral lower lobes, right middle lobe and left upper lobe. This is most significant in the left lower lobe. Findings are favored as infectious/inflammatory. 3. Aortic atherosclerosis."   CT abdomen pelvis with contrast showing: "IMPRESSION: 1. Findings compatible with small bowel obstruction with transition point in the right lower quadrant at the level of the anastomosis. There is mild mesenteric edema. No pneumatosis or free air. 2. Diffuse bladder wall thickening with surrounding inflammation worrisome for cystitis. 3. Small amount of free fluid in the pelvis. 4. Cholelithiasis. 5. 3.6 cm left ovarian  simple-appearing cyst. Recommend follow-up ultrasound in 6-12 months to confirm stability. 6. Aortic atherosclerosis."   ED physician consulted general surgery (Dr. Donell Beers) covering for [facility] who requested admission by hospitalist service.  NG tube was placed in the ED.  Patient received Zofran, azithromycin, ceftriaxone, and 500 cc normal saline bolus.  Plan of care: The patient is accepted for admission to Progressive unit, at [facility]..   [facility] will assume care on arrival to accepting facility. Until arrival, care as per EDP. However, [facility] available 24/7 for questions and assistance.  Nursing staff, please page [facility] Admits and Consults [phone]) as soon as the patient arrives to the hospital.    Author: John Giovanni, MD 08/01/2023  Check [email or web address] for on-call coverage.  Nursing staff, Please call [facility] Admits & Consults System-Wide number on Amion as soon as patient's arrival, so appropriate admitting provider can evaluate the pt.

## 2023-08-01 NOTE — ED Notes (Signed)
Kylie with cl called for transport

## 2023-08-01 NOTE — Plan of Care (Signed)

## 2023-08-01 NOTE — TOC CM/SW Note (Signed)
Transition of Care [facility]) - Inpatient Brief Assessment   Patient Details  Name: [name] MRN: [id number] Date of Birth: [birth year]  Transition of Care [facility]) CM/SW Contact:    Gordy Clement, RN Phone Number: 08/01/2023, 1:21 PM   Clinical Narrative:  Patient from home with Spouse. No TOC needs identified at this time. Please place a TOC consult if needed     Transition of Care Asessment: Insurance and Status: Insurance coverage has been reviewed (Medicare A and B) Patient has primary care physician: Yes (McGowen, Maryjean Morn, MD) Home environment has been reviewed: Home with Spouse Prior level of function:: Independent Prior/Current Home Services: No current home services Social Determinants of Health Reivew: SDOH reviewed no interventions necessary Readmission risk has been reviewed: Yes (12%) Transition of care needs: no transition of care needs at this time

## 2023-08-01 NOTE — Consult Note (Addendum)
Consult Note  [name] 15-Jan-1950  [id number].    Requesting MD: Vonita Moss, MD Chief Complaint/Reason for Consult: SBO HPI:  Patient is a 73 year old female who presented to [facility] with abdominal pain, nausea and vomiting. Symptoms started 7/30 0200 with intractable nausea and vomiting along with mild central abdominal discomfort. Emesis described as non-bloody, non-bilious. Last reported BM [place] 7/29. Reports small amount of flatus yesterday. May have aspirated. Hx of partial colectomy and colostomy in late 1990's s/p reversal as well as attempted robotic TAH with BSO for endometrial cancer. Patient tells me that she had a left oophorectomy but the rest of the procedure was aborted due to dense adhesions. [place] where she was followed by GYN oncology and received radiation therapy for endometrial cancer, last dose 05/2022. She is currently followed by Dr. Pricilla Holm every 6 months. PMH otherwise significant for HTN, HLD, A. Fib on eliquis (LD Monday evening 7/29 ), CKD stage III, T2DM, GERD and Gout. She is wheelchair bound at baseline but performs her own transfers. She is a former smoker who quit in 1989. Reports drinking 2 cocktails a week.  ROS: Negative other than HPI  Family History  Problem Relation Age of Onset   Hypertension Mother    Skin cancer Father    Diabetes Sister    Cancer Sister    Hypertension Sister    Cancer Sister    Diabetes Sister    Hearing loss Sister    Hypertension Sister    Kidney disease Sister    Cancer Sister    Diabetes Brother    Diabetes Maternal Grandfather    Stroke Maternal Grandfather    Diabetes Paternal Grandmother    Hearing loss Daughter     Past Medical History:  Diagnosis Date   Atrial fibrillation (HCC)    persistent (outpt monitoring 08/2022)   Bilateral lower extremity edema    Carpal tunnel syndrome    bilateral, had surgery on right hand   Chronic renal insufficiency, stage 3  (moderate) (HCC)    GFR 50s   Diabetes mellitus without complication (HCC)    Dr. Sharl Ma   Diverticulosis    +hx of 'itis   Endometrial cancer [facility])    RT spring 2024   GERD (gastroesophageal reflux disease)    Gout    No problems after getting on allopurinol   Heart murmur    soft, systolic   History of blood transfusion    History of colon polyps    History of iron deficiency    Hyperlipidemia    Hypertension    Osteoarthritis, multiple sites    esp knees   Urinary incontinence     Past Surgical History:  Procedure Laterality Date   CARPAL TUNNEL RELEASE Right 1991   COLON SURGERY     COLONOSCOPY     x2.  Last was 2018   COLOSTOMY     COLOSTOMY REVERSAL     DILATATION & CURETTAGE/HYSTEROSCOPY WITH MYOSURE N/A 07/17/2019   Procedure: DILATATION & CURETTAGE/HYSTEROSCOPY WITH MYOSURE;  Surgeon: Candice Camp, MD;  Location: [facility];  Service: Gynecology;  Laterality: N/A;   OOPHORECTOMY  2000   unilateral   PILONIDAL CYST EXCISION     ROBOTIC ASSISTED TOTAL HYSTERECTOMY WITH BILATERAL SALPINGO OOPHERECTOMY N/A 08/20/2019   Procedure: DIAGNSOTIC LAPAROSCOPY, DILATION AND CURETTAGE, IUD PLACEMENT ;  Surgeon: Adolphus Birchwood, MD;  Location: [facility];  Service: Gynecology;  Laterality: N/A;  SKIN CANCER EXCISION     TRANSTHORACIC ECHOCARDIOGRAM     08/2022 EF 50-55%, inc PA pressure, nl RV fxn, valves ok    Social History:  reports that she quit smoking about 36 years ago. Her smoking use included cigarettes. She started smoking about 58 years ago. She has a 66 pack-year smoking history. She has never used smokeless tobacco. She reports current alcohol use. She reports that she does not use drugs.  Allergies:  Allergies  Allergen Reactions   Lisinopril Cough    Other reaction(s): cough, Not available   Actos [Pioglitazone]     Unknown reaction   Metformin Other (See Comments)   Metformin And Related     Gas     (Not in a hospital admission)   Blood  pressure (!) 103/57, pulse 94, temperature 98.4 F (36.9 C), temperature source Oral, resp. rate 17, height 5\' 4"  (1.626 m), weight (!) 140.5 kg, SpO2 95%. Physical Exam:  General: pleasant, WD, obese, NAD, HEENT: head is normocephalic, atraumatic.  Sclera are noninjected.  PERRL.  Ears and nose without any masses or lesions.  Mouth is pink and moist Heart: irregularly irregular  Lungs: slightly labored on nasal cannula Abd: obese, soft, NT, there is epigastric fullness with what feels like diastasis vs hernia that is soft, +, no masses; previous laparotomy site note.  MS: all 4 extremities are symmetrical with no cyanosis, clubbing, or edema. Skin: warm and dry with no masses, lesions, or rashes Neuro: non-focal. Psych: A&Ox3 with an appropriate affect.   Results for orders placed or performed during the hospital encounter of 07/31/23 (from the past 48 hour(s))  Brain natriuretic peptide     Status: Abnormal   Collection Time: 07/31/23  8:23 PM  Result Value Ref Range   B Natriuretic Peptide 379.8 (H) 0.0 - 100.0 pg/mL    Comment: Performed at [facility], [address], [place], [place] [address]  Comprehensive metabolic panel     Status: Abnormal   Collection Time: 07/31/23  8:24 PM  Result Value Ref Range   Sodium 141 135 - 145 mmol/L   Potassium 5.0 3.5 - 5.1 mmol/L   Chloride 102 98 - 111 mmol/L   CO2 26 22 - 32 mmol/L   Glucose, Bld 181 (H) 70 - 99 mg/dL    Comment: Glucose reference range applies only to samples taken after fasting for at least 8 hours.   BUN 29 (H) 8 - 23 mg/dL   Creatinine, Ser 6.04 (H) 0.44 - 1.00 mg/dL   Calcium 9.9 8.9 - 54.0 mg/dL   Total Protein 7.0 6.5 - 8.1 g/dL   Albumin 4.0 3.5 - 5.0 g/dL   AST 40 15 - 41 U/L   ALT 13 0 - 44 U/L   Alkaline Phosphatase 85 38 - 126 U/L   Total Bilirubin 1.1 0.3 - 1.2 mg/dL   GFR, Estimated 31 (L) >60 mL/min    Comment: (NOTE) Calculated using the CKD-EPI Creatinine Equation (2021)     Anion gap 13 5 - 15    Comment: Performed at [facility], [address] [place], [place] [address]  Lactic acid, plasma     Status: Abnormal   Collection Time: 07/31/23  8:24 PM  Result Value Ref Range   Lactic Acid, Venous 2.8 (HH) 0.5 - 1.9 mmol/L    Comment: CRITICAL RESULT CALLED TO, READ BACK BY AND VERIFIED WITH: Marylene Land RN, 07/31/2023 @ 2104 BY JYANG Performed at [facility],  [address], [place], [place] [address]   CBC with Differential     Status: Abnormal   Collection Time: 07/31/23  8:24 PM  Result Value Ref Range   WBC 9.5 4.0 - 10.5 K/uL   RBC 3.99 3.87 - 5.11 MIL/uL   Hemoglobin 13.3 12.0 - 15.0 g/dL   HCT 60.4 54.0 - 98.1 %   MCV 101.0 (H) 80.0 - 100.0 fL   MCH 33.3 26.0 - 34.0 pg   MCHC 33.0 30.0 - 36.0 g/dL   RDW 19.1 47.8 - 29.5 %   Platelets 242 150 - 400 K/uL   nRBC 0.0 0.0 - 0.2 %   Neutrophils Relative % 88 %   Neutro Abs 8.5 (H) 1.7 - 7.7 K/uL   Lymphocytes Relative 4 %   Lymphs Abs 0.4 (L) 0.7 - 4.0 K/uL   Monocytes Relative 7 %   Monocytes Absolute 0.6 0.1 - 1.0 K/uL   Eosinophils Relative 0 %   Eosinophils Absolute 0.0 0.0 - 0.5 K/uL   Basophils Relative 0 %   Basophils Absolute 0.0 0.0 - 0.1 K/uL   Immature Granulocytes 1 %   Abs Immature Granulocytes 0.05 0.00 - 0.07 K/uL    Comment: Performed at [facility], [address], [place], [place] [address]  Protime-INR     Status: None   Collection Time: 07/31/23  8:24 PM  Result Value Ref Range   Prothrombin Time 15.2 11.4 - 15.2 seconds   INR 1.2 0.8 - 1.2    Comment: (NOTE) INR goal varies based on device and disease states. Performed at [facility], [address] [place], [place] [address]   Culture, blood (Routine x 2)     Status: None (Preliminary result)   Collection Time: 07/31/23  8:24 PM   Specimen: BLOOD  Result Value Ref Range   Specimen Description      BLOOD LEFT ANTECUBITAL Performed at  [facility], [address], [place], [place] [address]    Special Requests      BOTTLES DRAWN AEROBIC AND ANAEROBIC Blood Culture adequate volume Performed at [facility], [address], [place], [place] [address]    Culture      NO GROWTH < 12 HOURS Performed at [facility], [address] [place], [place] [address]    Report Status PENDING   Culture, blood (Routine x 2)     Status: None (Preliminary result)   Collection Time: 07/31/23  8:53 PM   Specimen: BLOOD  Result Value Ref Range   Specimen Description      BLOOD BLOOD LEFT ARM Performed at [facility], [address], [place], [place] [address]    Special Requests      BOTTLES DRAWN AEROBIC AND ANAEROBIC Blood Culture results may not be optimal due to an excessive volume of blood received in culture bottles Performed at [facility], [address], [place], [place] [address]    Culture      NO GROWTH < 12 HOURS Performed at [facility], [address] [place], [place] [address]    Report Status PENDING   Lactic acid, plasma     Status: Abnormal   Collection Time: 07/31/23 10:10 PM  Result Value Ref Range   Lactic Acid, Venous 2.2 (HH) 0.5 - 1.9 mmol/L    Comment: CRITICAL RESULT CALLED TO, READ BACK BY AND VERIFIED WITH: Maxcine Ham, RN BY RP 07/31/23 2240 Performed at [facility], [address], [place], [place] [address]    DG Abd Portable 1 View  Result Date: 08/01/2023 CLINICAL DATA:  NG tube placement. EXAM: PORTABLE ABDOMEN - 1 VIEW COMPARISON:  06/08/2016. FINDINGS: The bowel gas pattern is normal field-of-view limited to the upper abdomen. An enteric tube terminates in the stomach. The heart is enlarged and atherosclerotic calcification of the aorta is noted. IMPRESSION: An enteric tube terminates in the stomach. Electronically Signed   By: Thornell Sartorius M.D.   On: 08/01/2023 00:54   CT ABDOMEN  PELVIS W CONTRAST  Result Date: 07/31/2023 CLINICAL DATA:  Nausea and vomiting.  History of adhesions. EXAM: CT ABDOMEN AND PELVIS WITH CONTRAST TECHNIQUE: Multidetector CT imaging of the abdomen and pelvis was performed using the standard protocol following bolus administration of intravenous contrast. RADIATION DOSE REDUCTION: This exam was performed according to the departmental dose-optimization program which includes automated exposure control, adjustment of the mA and/or kV according to patient size and/or use of iterative reconstruction technique. CONTRAST:  75mL OMNIPAQUE IOHEXOL 350 MG/ML SOLN COMPARISON:  CT abdomen and pelvis 06/07/2016 FINDINGS: Lower chest: Multifocal airspace disease is present in the lower lungs bilaterally. Hepatobiliary: Single large gallstone present measuring 4.2 cm. No pericholecystic inflammation or biliary ductal dilatation. The liver is within normal limits. Pancreas: Unremarkable. No pancreatic ductal dilatation or surrounding inflammatory changes. Spleen: Normal in size without focal abnormality. Adrenals/Urinary Tract: There is diffuse bladder wall thickening with surrounding inflammation. Stomach/Bowel: Sigmoid colon anastomosis is present. The colon is nondilated. The appendix is not seen. There are dilated mid and proximal small bowel loops with air-fluid levels measuring up to 5 cm. The stomach is moderately distended with air-fluid level. Transition point is seen at a level of the anastomosis in the right lower quadrant coronal image 5/81. Distal small bowel is decompressed. There is mild mesenteric edema involving dilated small bowel loops. No pneumatosis or free air. Vascular/Lymphatic: Aortic atherosclerosis. No enlarged abdominal or pelvic lymph nodes. Reproductive: There is a 3.6 cm cyst in the left ovary which has minimally increased in size compared to 2017. The uterus and right ovary are within normal limits. Other: There is a small amount of free fluid in  the pelvis. Multiple small bowel loops approximate the anterior abdominal wall likely related to adhesive disease similar to the prior study. There is no focal abdominal wall hernia. Musculoskeletal: Multilevel degenerative changes affect the spine. IMPRESSION: 1. Findings compatible with small bowel obstruction with transition point in the right lower quadrant at the level of the anastomosis. There is mild mesenteric edema. No pneumatosis or free air. 2. Diffuse bladder wall thickening with surrounding inflammation worrisome for cystitis. 3. Small amount of free fluid in the pelvis. 4. Cholelithiasis. 5. 3.6 cm left ovarian simple-appearing cyst. Recommend follow-up ultrasound in 6-12 months to confirm stability. 6. Aortic atherosclerosis. Aortic Atherosclerosis (ICD10-I70.0). These results were called by telephone at the time of interpretation on 07/31/2023 at 11:43 pm to provider Vonita Moss , who verbally acknowledged these results. Electronically Signed   By: Darliss Cheney M.D.   On: 07/31/2023 23:44   CT Angio Chest PE W and/or Wo Contrast  Result Date: 07/31/2023 CLINICAL DATA:  Syncope and emesis. Fall. History of endometrial cancer status post brachytherapy. EXAM: CT ANGIOGRAPHY CHEST WITH CONTRAST TECHNIQUE: Multidetector CT imaging of the chest was performed using the standard protocol during bolus administration of intravenous contrast. Multiplanar CT image reconstructions and MIPs were obtained to evaluate the vascular anatomy. RADIATION DOSE REDUCTION: This exam was  performed according to the departmental dose-optimization program which includes automated exposure control, adjustment of the mA and/or kV according to patient size and/or use of iterative reconstruction technique. CONTRAST:  75mL OMNIPAQUE IOHEXOL 350 MG/ML SOLN COMPARISON:  None Available. FINDINGS: Cardiovascular: Satisfactory opacification of the pulmonary arteries to the segmental level. No evidence of pulmonary embolism.  Normal heart size. No pericardial effusion. There are atherosclerotic calcifications of the aorta. Mediastinum/Nodes: No enlarged mediastinal, hilar, or axillary lymph nodes. Thyroid gland, trachea, and esophagus demonstrate no significant findings. Lungs/Pleura: Multifocal airspace and ground-glass opacities with slightly nodular component are seen in the bilateral lower lobes right middle lobe and left upper lobe. This is most significant in the left lower lobe. There is no pleural effusion or pneumothorax. Upper Abdomen: No acute abnormality. Musculoskeletal: No chest wall abnormality. No acute or significant osseous findings. Review of the MIP images confirms the above findings. IMPRESSION: 1. No evidence for pulmonary embolism. 2. Multifocal airspace and ground-glass opacities with slightly nodular component are seen in the bilateral lower lobes, right middle lobe and left upper lobe. This is most significant in the left lower lobe. Findings are favored as infectious/inflammatory. 3. Aortic atherosclerosis. Aortic Atherosclerosis (ICD10-I70.0). Electronically Signed   By: Darliss Cheney M.D.   On: 07/31/2023 22:35   DG Chest Portable 1 View  Result Date: 07/31/2023 CLINICAL DATA:  Vomiting and syncope. EXAM: PORTABLE CHEST 1 VIEW COMPARISON:  July 23, 2022 FINDINGS: The cardiac silhouette is mildly enlarged and unchanged in size. There is marked severity calcification of the aortic arch. Moderate severity diffusely increased interstitial lung markings are seen. Mild atelectasis and/or infiltrate is noted within the bilateral lung bases. No pleural effusion or pneumothorax are identified. Multilevel degenerative changes seen throughout the thoracic spine. IMPRESSION: 1. Moderate severity diffusely increased interstitial lung markings which may represent mild interstitial edema. 2. Mild bibasilar atelectasis and/or infiltrate. Electronically Signed   By: Aram Candela M.D.   On: 07/31/2023 22:01       Assessment/Plan SBO - Hx of endometrial cancer s/p left oophorectomy and aborted TAH, partial colectomy with colostomy, and colostomy reversal - CT yesterday with SBO with transition point in the RLQ at the level of previous anastomosis, no pneumatosis or free air - no leukocytosis, lactic elevated to 2.8> 2.2 > 2.3 but also has a pneumonia and dehydration in the setting of emesis. No clinical signs of bowel ischemia on exam. monitor - NGT placed in ED with over 1700 cc over 24 hours  - No emergent surgical needs, recommend SBO protocol with gastrografin.  - hopefully this will resolve without surgical intervention but we will follow closely   FEN: NPO, IVF, NGT to [facility]  VTE: hold eliquis ID: [email or web address] for likely aspiration   - per [facility] -  Hx of endometrial CA A. Fib on eliquis CKD stage III HTN HLD T2DM GERD Gout   I reviewed nursing notes, ED provider notes, hospitalist notes, last 24 h vitals and pain scores, last 48 h intake and output, last 24 h labs and trends, and last 24 h imaging results.   Hosie Spangle, [facility] 08/01/2023, 11:11 AM Please see Amion for pager number during day hours 7:00am-4:30pm

## 2023-08-02 ENCOUNTER — Inpatient Hospital Stay (HOSPITAL_COMMUNITY): Payer: Medicare Other

## 2023-08-02 DIAGNOSIS — K56609 Unspecified intestinal obstruction, unspecified as to partial versus complete obstruction: Secondary | ICD-10-CM | POA: Diagnosis not present

## 2023-08-02 DIAGNOSIS — T17908A Unspecified foreign body in respiratory tract, part unspecified causing other injury, initial encounter: Secondary | ICD-10-CM | POA: Diagnosis not present

## 2023-08-02 DIAGNOSIS — I4891 Unspecified atrial fibrillation: Secondary | ICD-10-CM | POA: Diagnosis not present

## 2023-08-02 DIAGNOSIS — J9601 Acute respiratory failure with hypoxia: Secondary | ICD-10-CM | POA: Diagnosis not present

## 2023-08-02 LAB — GLUCOSE, CAPILLARY
Glucose-Capillary: 165 mg/dL — ABNORMAL HIGH (ref 70–99)
Glucose-Capillary: 168 mg/dL — ABNORMAL HIGH (ref 70–99)
Glucose-Capillary: 178 mg/dL — ABNORMAL HIGH (ref 70–99)
Glucose-Capillary: 187 mg/dL — ABNORMAL HIGH (ref 70–99)
Glucose-Capillary: 222 mg/dL — ABNORMAL HIGH (ref 70–99)
Glucose-Capillary: 225 mg/dL — ABNORMAL HIGH (ref 70–99)

## 2023-08-02 LAB — APTT: aPTT: 46 seconds — ABNORMAL HIGH (ref 24–36)

## 2023-08-02 LAB — HEPARIN LEVEL (UNFRACTIONATED): Heparin Unfractionated: 0.81 IU/mL — ABNORMAL HIGH (ref 0.30–0.70)

## 2023-08-02 MED ORDER — HEPARIN (PORCINE) 25000 UT/250ML-% IV SOLN
1900.0000 [IU]/h | INTRAVENOUS | Status: DC
  Administered 2023-08-02: 1350 [IU]/h via INTRAVENOUS
  Filled 2023-08-02 (×2): qty 250

## 2023-08-02 MED ORDER — CARVEDILOL 12.5 MG PO TABS
12.5000 mg | ORAL_TABLET | Freq: Two times a day (BID) | ORAL | Status: DC
  Administered 2023-08-02 – 2023-08-06 (×8): 12.5 mg via ORAL
  Filled 2023-08-02 (×8): qty 1

## 2023-08-02 MED ORDER — HEPARIN BOLUS VIA INFUSION
4000.0000 [IU] | Freq: Once | INTRAVENOUS | Status: AC
  Administered 2023-08-02: 4000 [IU] via INTRAVENOUS
  Filled 2023-08-02: qty 4000

## 2023-08-02 NOTE — Progress Notes (Signed)
PROGRESS NOTE        PATIENT DETAILS Name: [name] Age: 73 y.o. Sex: female Date of Birth: 1950-07-23 Admit Date: 07/31/2023 Admitting Physician Synetta Fail, MD MVH:[id number], Maryjean Morn, MD  Brief Summary: Patient is a 73 y.o.  female with history of HTN, HLD, history of prior colectomy/colostomy in the late 1990s-s/p reversal, endometrial cancer-s/p TAH with BSO-presenting with abdominal pain/vomiting-found to have SBO.  While in the ED-during one of the episodes of vomiting-patient sustained a syncopal episode and subsequently aspirated vomitus.  Patient was then admitted to the hospitalist service for further evaluation and treatment.  Significant events: 7/31>> admit to [facility]  Significant studies: 7/31>> CT abdomen/pelvis: SBO with transition point right lower quadrant. 7/31>> CT angio chest: No PE, multifocal PNA 8/02>> x-ray abdomen SBO protocol: Contrast in sigmoid colon.  Significant microbiology data: 7/31>> blood culture: No growth  Procedures: None  Consults: General surgery  Subjective: Lying comfortably in bed-denies any chest pain or shortness of breath.  Passing flatus-had a big BM overnight.  Objective: Vitals: Blood pressure (!) 143/74, pulse (!) 116, temperature 98.4 F (36.9 C), temperature source Oral, resp. rate 19, height 5\' 4"  (1.626 m), weight 130.2 kg, SpO2 93%.   Exam: Gen Exam:Alert awake-not in any distress HEENT:atraumatic, normocephalic Chest: B/L clear to auscultation anteriorly CVS:S1S2 regular Abdomen:soft non tender, non distended Extremities:no edema Neurology: Non focal Skin: no rash  Pertinent Labs/Radiology:    Latest Ref Rng & Units 08/02/2023   12:27 AM 08/01/2023    2:15 PM 07/31/2023    8:24 PM  CBC  WBC 4.0 - 10.5 K/uL 10.4  10.7  9.5   Hemoglobin 12.0 - 15.0 g/dL 52.8  41.3  24.4   Hematocrit 36.0 - 46.0 % 36.5  36.1  40.3   Platelets 150 - 400 K/uL 150  157  242     Lab Results   Component Value Date   NA 144 08/02/2023   K 3.8 08/02/2023   CL 102 08/02/2023   CO2 27 08/02/2023     Assessment/Plan: SBO Likely secondary to adhesions/scar tissue Had BM/passing flatus Overall improved-General surgery planning to clamp NG tube and start clear liquids Follow clinical course.  Acute hypoxic respiratory failure secondary to aspiration pneumonia Still requiring 3-4 L of oxygen-aspiration acute in the setting of vomiting and syncope Continue Unasyn Follow cultures As she gradually improves-will attempt to slowly titrate down FiO2  Syncope This occurred with vomiting-likely vasovagal-furthermore-patient has history of syncope in the past associated with vomiting episodes. Continue telemetry monitoring Recent echo September 2023 with stable EF.  AKI Hemodynamically mediated Improving with supportive care Avoid nephrotoxic agents  PAF Resume low-dose Coreg Continue to hold Eliquis-in the interim-start heparin infusion  HTN BP relatively stable Resume Coreg today now that oral intake has been resumed-will continue to hold other antihypertensives.  HLD Statin on hold-resume when oral intake resumes  DM-2 Follow CBGs Continue insulin 70/30 20 units twice daily (reduced dose)  Recent Labs    08/02/23 0021 08/02/23 0341 08/02/23 0754  GLUCAP 168* 187* 165*    3.6 cm left ovarian simple appearing cyst Radiology recommending follow-up ultrasound in 6 to 12 months  History of endometrial cancer Follow-up with GYN oncology  Obesity: Estimated body mass index is 49.27 kg/m as calculated from the following:   Height as of this encounter: 5\' 4"  (1.626  m).   Weight as of this encounter: 130.2 kg.   Code status:   Code Status: Full Code   DVT Prophylaxis: SCDs Start: 08/01/23 1401   Family Communication: None at bedside   Disposition Plan: Status is: Inpatient Remains inpatient appropriate because: Severity of illness   Planned Discharge  Destination:Home health   Diet: Diet Order             Diet clear liquid Fluid consistency: Thin  Diet effective now                     Antimicrobial agents: Anti-infectives (From admission, onward)    Start     Dose/Rate Route Frequency Ordered Stop   08/01/23 1500  Ampicillin-Sulbactam (UNASYN) 3 g in sodium chloride 0.9 % 100 mL IVPB        3 g 200 mL/hr over 30 Minutes Intravenous Every 8 hours 08/01/23 1420     07/31/23 2315  cefTRIAXone (ROCEPHIN) 1 g in sodium chloride 0.9 % 100 mL IVPB  Status:  Discontinued        1 g 200 mL/hr over 30 Minutes Intravenous  Once 07/31/23 2313 07/31/23 2313   07/31/23 2315  azithromycin (ZITHROMAX) 500 mg in sodium chloride 0.9 % 250 mL IVPB        500 mg 250 mL/hr over 60 Minutes Intravenous  Once 07/31/23 2313 08/01/23 0118   07/31/23 2315  cefTRIAXone (ROCEPHIN) 2 g in sodium chloride 0.9 % 100 mL IVPB        2 g 200 mL/hr over 30 Minutes Intravenous  Once 07/31/23 2313 08/01/23 0005        MEDICATIONS: Scheduled Meds:  insulin aspart  0-15 Units Subcutaneous Q4H   insulin aspart protamine- aspart  20 Units Subcutaneous BID WC   sodium chloride flush  3 mL Intravenous Q12H   Continuous Infusions:  ampicillin-sulbactam (UNASYN) IV 3 g (08/02/23 0506)   PRN Meds:.acetaminophen **OR** acetaminophen   I have personally reviewed following labs and imaging studies  LABORATORY DATA: CBC: Recent Labs  Lab 07/31/23 2024 08/01/23 1415 08/02/23 0027  WBC 9.5 10.7* 10.4  NEUTROABS 8.5*  --   --   HGB 13.3 11.4* 11.5*  HCT 40.3 36.1 36.5  MCV 101.0* 105.9* 106.1*  PLT 242 157 150    Basic Metabolic Panel: Recent Labs  Lab 07/31/23 2024 08/01/23 1415 08/02/23 0027  NA 141 142 144  K 5.0 4.1 3.8  CL 102 101 102  CO2 26 29 27   GLUCOSE 181* 195* 177*  BUN 29* 38* 39*  CREATININE 1.69* 1.53* 1.42*  CALCIUM 9.9 8.5* 8.7*    GFR: Estimated Creatinine Clearance: 46.6 mL/min (A) (by C-G formula based on SCr of  1.42 mg/dL (H)).  Liver Function Tests: Recent Labs  Lab 07/31/23 2024 08/01/23 1415 08/02/23 0027  AST 40 17 21  ALT 13 15 16   ALKPHOS 85 86 88  BILITOT 1.1 1.1 0.8  PROT 7.0 6.1* 6.3*  ALBUMIN 4.0 3.1* 3.1*   No results for input(s): "LIPASE", "AMYLASE" in the last 168 hours. No results for input(s): "AMMONIA" in the last 168 hours.  Coagulation Profile: Recent Labs  Lab 07/31/23 2024  INR 1.2    Cardiac Enzymes: No results for input(s): "CKTOTAL", "CKMB", "CKMBINDEX", "TROPONINI" in the last 168 hours.  BNP (last 3 results) Recent Labs    10/16/22 1316 11/01/22 1126 12/19/22 1354  PROBNP 1,363* 1,748* 1,229*    Lipid Profile: No results for input(s): "  CHOL", "HDL", "LDLCALC", "TRIG", "CHOLHDL", "LDLDIRECT" in the last 72 hours.  Thyroid Function Tests: No results for input(s): "TSH", "T4TOTAL", "FREET4", "T3FREE", "THYROIDAB" in the last 72 hours.  Anemia Panel: No results for input(s): "VITAMINB12", "FOLATE", "FERRITIN", "TIBC", "IRON", "RETICCTPCT" in the last 72 hours.  Urine analysis:    Component Value Date/Time   COLORURINE YELLOW 08/17/2019 1215   APPEARANCEUR CLEAR 08/17/2019 1215   LABSPEC 1.012 08/17/2019 1215   PHURINE 5.0 08/17/2019 1215   GLUCOSEU NEGATIVE 08/17/2019 1215   HGBUR NEGATIVE 08/17/2019 1215   BILIRUBINUR NEGATIVE 08/17/2019 1215   KETONESUR NEGATIVE 08/17/2019 1215   PROTEINUR NEGATIVE 08/17/2019 1215   NITRITE NEGATIVE 08/17/2019 1215   LEUKOCYTESUR SMALL (A) 08/17/2019 1215    Sepsis Labs: Lactic Acid, Venous    Component Value Date/Time   LATICACIDVEN 2.3 (HH) 08/01/2023 1133    MICROBIOLOGY: Recent Results (from the past 240 hour(s))  Culture, blood (Routine x 2)     Status: None (Preliminary result)   Collection Time: 07/31/23  8:24 PM   Specimen: BLOOD  Result Value Ref Range Status   Specimen Description   Final    BLOOD LEFT ANTECUBITAL Performed at [facility], [address], [place], [place] [address]    Special Requests   Final    BOTTLES DRAWN AEROBIC AND ANAEROBIC Blood Culture adequate volume Performed at [facility], [address] [place], [place] [address]    Culture   Final    NO GROWTH 2 DAYS Performed at [facility], [address], [place], [place] [address]    Report Status PENDING  Incomplete  Culture, blood (Routine x 2)     Status: None (Preliminary result)   Collection Time: 07/31/23  8:53 PM   Specimen: BLOOD  Result Value Ref Range Status   Specimen Description   Final    BLOOD BLOOD LEFT ARM Performed at [facility], [address], [place], [place] [address]    Special Requests   Final    BOTTLES DRAWN AEROBIC AND ANAEROBIC Blood Culture results may not be optimal due to an excessive volume of blood received in culture bottles Performed at [facility], [address], [place], [place] [address]    Culture   Final    NO GROWTH 2 DAYS Performed at [facility], [address], [place], [place] [address]    Report Status PENDING  Incomplete    RADIOLOGY STUDIES/RESULTS: DG Abd Portable 1V-Small Bowel Obstruction Protocol-initial, 8 hr delay  Result Date: 08/02/2023 CLINICAL DATA:  Small bowel obstruction. EXAM: PORTABLE ABDOMEN - 1 VIEW COMPARISON:  CT from 07/31/2023 and abdomen radiograph from 08/01/2023. FINDINGS: Significantly diminished exam detail due to patient body habitus. There is a enteric tube with tip and side port in the stomach. Enteric contrast material is identified within a loop of bowel in the left lower quadrant of the abdomen, which appears to conform to the location of the sigmoid colon. Small amount of enteric contrast material is also noted within the right upper quadrant of the abdomen location of which is difficult to confirm but may localized to the hepatic flexure. IMPRESSION: 1. Significantly diminished exam detail due to patient  body habitus. 2. Enteric contrast material is identified within a loop of bowel in the left lower quadrant of the abdomen, which appears to conform to the location of the sigmoid colon. Small amount of enteric contrast material is also noted within the right upper quadrant of  the abdomen location of which is difficult to confirm but may localized to the hepatic flexure. Due to the limitations described above a repeat CT of the abdomen pelvis may be helpful to confirm resolving small-bowel obstruction. Electronically Signed   By: Signa Kell M.D.   On: 08/02/2023 05:47   DG Abd Portable 1 View  Result Date: 08/01/2023 CLINICAL DATA:  NG tube placement. EXAM: PORTABLE ABDOMEN - 1 VIEW COMPARISON:  06/08/2016. FINDINGS: The bowel gas pattern is normal field-of-view limited to the upper abdomen. An enteric tube terminates in the stomach. The heart is enlarged and atherosclerotic calcification of the aorta is noted. IMPRESSION: An enteric tube terminates in the stomach. Electronically Signed   By: Thornell Sartorius M.D.   On: 08/01/2023 00:54   CT ABDOMEN PELVIS W CONTRAST  Result Date: 07/31/2023 CLINICAL DATA:  Nausea and vomiting.  History of adhesions. EXAM: CT ABDOMEN AND PELVIS WITH CONTRAST TECHNIQUE: Multidetector CT imaging of the abdomen and pelvis was performed using the standard protocol following bolus administration of intravenous contrast. RADIATION DOSE REDUCTION: This exam was performed according to the departmental dose-optimization program which includes automated exposure control, adjustment of the mA and/or kV according to patient size and/or use of iterative reconstruction technique. CONTRAST:  75mL OMNIPAQUE IOHEXOL 350 MG/ML SOLN COMPARISON:  CT abdomen and pelvis 06/07/2016 FINDINGS: Lower chest: Multifocal airspace disease is present in the lower lungs bilaterally. Hepatobiliary: Single large gallstone present measuring 4.2 cm. No pericholecystic inflammation or biliary ductal dilatation.  The liver is within normal limits. Pancreas: Unremarkable. No pancreatic ductal dilatation or surrounding inflammatory changes. Spleen: Normal in size without focal abnormality. Adrenals/Urinary Tract: There is diffuse bladder wall thickening with surrounding inflammation. Stomach/Bowel: Sigmoid colon anastomosis is present. The colon is nondilated. The appendix is not seen. There are dilated mid and proximal small bowel loops with air-fluid levels measuring up to 5 cm. The stomach is moderately distended with air-fluid level. Transition point is seen at a level of the anastomosis in the right lower quadrant coronal image 5/81. Distal small bowel is decompressed. There is mild mesenteric edema involving dilated small bowel loops. No pneumatosis or free air. Vascular/Lymphatic: Aortic atherosclerosis. No enlarged abdominal or pelvic lymph nodes. Reproductive: There is a 3.6 cm cyst in the left ovary which has minimally increased in size compared to 2017. The uterus and right ovary are within normal limits. Other: There is a small amount of free fluid in the pelvis. Multiple small bowel loops approximate the anterior abdominal wall likely related to adhesive disease similar to the prior study. There is no focal abdominal wall hernia. Musculoskeletal: Multilevel degenerative changes affect the spine. IMPRESSION: 1. Findings compatible with small bowel obstruction with transition point in the right lower quadrant at the level of the anastomosis. There is mild mesenteric edema. No pneumatosis or free air. 2. Diffuse bladder wall thickening with surrounding inflammation worrisome for cystitis. 3. Small amount of free fluid in the pelvis. 4. Cholelithiasis. 5. 3.6 cm left ovarian simple-appearing cyst. Recommend follow-up ultrasound in 6-12 months to confirm stability. 6. Aortic atherosclerosis. Aortic Atherosclerosis (ICD10-I70.0). These results were called by telephone at the time of interpretation on 07/31/2023 at 11:43  pm to provider Vonita Moss , who verbally acknowledged these results. Electronically Signed   By: Darliss Cheney M.D.   On: 07/31/2023 23:44   CT Angio Chest PE W and/or Wo Contrast  Result Date: 07/31/2023 CLINICAL DATA:  Syncope and emesis. Fall. History of endometrial cancer status post brachytherapy. EXAM:  CT ANGIOGRAPHY CHEST WITH CONTRAST TECHNIQUE: Multidetector CT imaging of the chest was performed using the standard protocol during bolus administration of intravenous contrast. Multiplanar CT image reconstructions and MIPs were obtained to evaluate the vascular anatomy. RADIATION DOSE REDUCTION: This exam was performed according to the departmental dose-optimization program which includes automated exposure control, adjustment of the mA and/or kV according to patient size and/or use of iterative reconstruction technique. CONTRAST:  75mL OMNIPAQUE IOHEXOL 350 MG/ML SOLN COMPARISON:  None Available. FINDINGS: Cardiovascular: Satisfactory opacification of the pulmonary arteries to the segmental level. No evidence of pulmonary embolism. Normal heart size. No pericardial effusion. There are atherosclerotic calcifications of the aorta. Mediastinum/Nodes: No enlarged mediastinal, hilar, or axillary lymph nodes. Thyroid gland, trachea, and esophagus demonstrate no significant findings. Lungs/Pleura: Multifocal airspace and ground-glass opacities with slightly nodular component are seen in the bilateral lower lobes right middle lobe and left upper lobe. This is most significant in the left lower lobe. There is no pleural effusion or pneumothorax. Upper Abdomen: No acute abnormality. Musculoskeletal: No chest wall abnormality. No acute or significant osseous findings. Review of the MIP images confirms the above findings. IMPRESSION: 1. No evidence for pulmonary embolism. 2. Multifocal airspace and ground-glass opacities with slightly nodular component are seen in the bilateral lower lobes, right middle lobe and  left upper lobe. This is most significant in the left lower lobe. Findings are favored as infectious/inflammatory. 3. Aortic atherosclerosis. Aortic Atherosclerosis (ICD10-I70.0). Electronically Signed   By: Darliss Cheney M.D.   On: 07/31/2023 22:35   DG Chest Portable 1 View  Result Date: 07/31/2023 CLINICAL DATA:  Vomiting and syncope. EXAM: PORTABLE CHEST 1 VIEW COMPARISON:  July 23, 2022 FINDINGS: The cardiac silhouette is mildly enlarged and unchanged in size. There is marked severity calcification of the aortic arch. Moderate severity diffusely increased interstitial lung markings are seen. Mild atelectasis and/or infiltrate is noted within the bilateral lung bases. No pleural effusion or pneumothorax are identified. Multilevel degenerative changes seen throughout the thoracic spine. IMPRESSION: 1. Moderate severity diffusely increased interstitial lung markings which may represent mild interstitial edema. 2. Mild bibasilar atelectasis and/or infiltrate. Electronically Signed   By: Aram Candela M.D.   On: 07/31/2023 22:01     LOS: 1 day   Jeoffrey Massed, MD  [facility]    To contact the attending provider between 7A-7P or the covering provider during after hours 7P-7A, please log into the web site [email or web address] and access using universal [place] password for that web site. If you do not have the password, please call the hospital operator.  08/02/2023, 10:30 AM

## 2023-08-02 NOTE — Progress Notes (Signed)
Subjective: Feeling better today.  Had a massive blow out of diarrhea after contrast yesterday.    ROS: See above, otherwise other systems negative  Objective: Vital signs in last 24 hours: Temp:  [98.2 F (36.8 C)-98.5 F (36.9 C)] 98.4 F (36.9 C) (08/02 0800) Pulse Rate:  [107-116] 116 (08/02 0800) Resp:  [19-20] 19 (08/02 0800) BP: (112-153)/(68-78) 143/74 (08/02 0800) SpO2:  [93 %-97 %] 93 % (08/02 0800) Weight:  [130.2 kg] 130.2 kg (08/02 0500) Last BM Date : 08/01/23  Intake/Output from previous day: 08/01 0701 - 08/02 0700 In: 240.2 [IV Piggyback:240.2] Out: 350 [Emesis/NG output:350] Intake/Output this shift: No intake/output data recorded.  PE: Abd: soft, obese, seems nontender, +BS, NGT with only 300cc of output today.  Lab Results:  Recent Labs    08/01/23 1415 08/02/23 0027  WBC 10.7* 10.4  HGB 11.4* 11.5*  HCT 36.1 36.5  PLT 157 150   BMET Recent Labs    08/01/23 1415 08/02/23 0027  NA 142 144  K 4.1 3.8  CL 101 102  CO2 29 27  GLUCOSE 195* 177*  BUN 38* 39*  CREATININE 1.53* 1.42*  CALCIUM 8.5* 8.7*   PT/INR Recent Labs    07/31/23 2024  LABPROT 15.2  INR 1.2   CMP     Component Value Date/Time   NA 144 08/02/2023 0027   NA 138 12/19/2022 1354   K 3.8 08/02/2023 0027   CL 102 08/02/2023 0027   CO2 27 08/02/2023 0027   GLUCOSE 177 (H) 08/02/2023 0027   BUN 39 (H) 08/02/2023 0027   BUN 33 (H) 12/19/2022 1354   CREATININE 1.42 (H) 08/02/2023 0027   CREATININE 1.19 (H) 02/04/2023 1326   CALCIUM 8.7 (L) 08/02/2023 0027   PROT 6.3 (L) 08/02/2023 0027   ALBUMIN 3.1 (L) 08/02/2023 0027   AST 21 08/02/2023 0027   ALT 16 08/02/2023 0027   ALKPHOS 88 08/02/2023 0027   BILITOT 0.8 08/02/2023 0027   GFRNONAA 39 (L) 08/02/2023 0027   GFRAA >60 08/27/2019 1540   Lipase     Component Value Date/Time   LIPASE 22 06/07/2016 0826       Studies/Results: DG Abd Portable 1V-Small Bowel Obstruction Protocol-initial, 8 hr  delay  Result Date: 08/02/2023 CLINICAL DATA:  Small bowel obstruction. EXAM: PORTABLE ABDOMEN - 1 VIEW COMPARISON:  CT from 07/31/2023 and abdomen radiograph from 08/01/2023. FINDINGS: Significantly diminished exam detail due to patient body habitus. There is a enteric tube with tip and side port in the stomach. Enteric contrast material is identified within a loop of bowel in the left lower quadrant of the abdomen, which appears to conform to the location of the sigmoid colon. Small amount of enteric contrast material is also noted within the right upper quadrant of the abdomen location of which is difficult to confirm but may localized to the hepatic flexure. IMPRESSION: 1. Significantly diminished exam detail due to patient body habitus. 2. Enteric contrast material is identified within a loop of bowel in the left lower quadrant of the abdomen, which appears to conform to the location of the sigmoid colon. Small amount of enteric contrast material is also noted within the right upper quadrant of the abdomen location of which is difficult to confirm but may localized to the hepatic flexure. Due to the limitations described above a repeat CT of the abdomen pelvis may be helpful to confirm resolving small-bowel obstruction. Electronically Signed   By: Veronda Prude.D.  On: 08/02/2023 05:47   DG Abd Portable 1 View  Result Date: 08/01/2023 CLINICAL DATA:  NG tube placement. EXAM: PORTABLE ABDOMEN - 1 VIEW COMPARISON:  06/08/2016. FINDINGS: The bowel gas pattern is normal field-of-view limited to the upper abdomen. An enteric tube terminates in the stomach. The heart is enlarged and atherosclerotic calcification of the aorta is noted. IMPRESSION: An enteric tube terminates in the stomach. Electronically Signed   By: Thornell Sartorius M.D.   On: 08/01/2023 00:54   CT ABDOMEN PELVIS W CONTRAST  Result Date: 07/31/2023 CLINICAL DATA:  Nausea and vomiting.  History of adhesions. EXAM: CT ABDOMEN AND PELVIS WITH  CONTRAST TECHNIQUE: Multidetector CT imaging of the abdomen and pelvis was performed using the standard protocol following bolus administration of intravenous contrast. RADIATION DOSE REDUCTION: This exam was performed according to the departmental dose-optimization program which includes automated exposure control, adjustment of the mA and/or kV according to patient size and/or use of iterative reconstruction technique. CONTRAST:  75mL OMNIPAQUE IOHEXOL 350 MG/ML SOLN COMPARISON:  CT abdomen and pelvis 06/07/2016 FINDINGS: Lower chest: Multifocal airspace disease is present in the lower lungs bilaterally. Hepatobiliary: Single large gallstone present measuring 4.2 cm. No pericholecystic inflammation or biliary ductal dilatation. The liver is within normal limits. Pancreas: Unremarkable. No pancreatic ductal dilatation or surrounding inflammatory changes. Spleen: Normal in size without focal abnormality. Adrenals/Urinary Tract: There is diffuse bladder wall thickening with surrounding inflammation. Stomach/Bowel: Sigmoid colon anastomosis is present. The colon is nondilated. The appendix is not seen. There are dilated mid and proximal small bowel loops with air-fluid levels measuring up to 5 cm. The stomach is moderately distended with air-fluid level. Transition point is seen at a level of the anastomosis in the right lower quadrant coronal image 5/81. Distal small bowel is decompressed. There is mild mesenteric edema involving dilated small bowel loops. No pneumatosis or free air. Vascular/Lymphatic: Aortic atherosclerosis. No enlarged abdominal or pelvic lymph nodes. Reproductive: There is a 3.6 cm cyst in the left ovary which has minimally increased in size compared to 2017. The uterus and right ovary are within normal limits. Other: There is a small amount of free fluid in the pelvis. Multiple small bowel loops approximate the anterior abdominal wall likely related to adhesive disease similar to the prior  study. There is no focal abdominal wall hernia. Musculoskeletal: Multilevel degenerative changes affect the spine. IMPRESSION: 1. Findings compatible with small bowel obstruction with transition point in the right lower quadrant at the level of the anastomosis. There is mild mesenteric edema. No pneumatosis or free air. 2. Diffuse bladder wall thickening with surrounding inflammation worrisome for cystitis. 3. Small amount of free fluid in the pelvis. 4. Cholelithiasis. 5. 3.6 cm left ovarian simple-appearing cyst. Recommend follow-up ultrasound in 6-12 months to confirm stability. 6. Aortic atherosclerosis. Aortic Atherosclerosis (ICD10-I70.0). These results were called by telephone at the time of interpretation on 07/31/2023 at 11:43 pm to provider Vonita Moss , who verbally acknowledged these results. Electronically Signed   By: Darliss Cheney M.D.   On: 07/31/2023 23:44   CT Angio Chest PE W and/or Wo Contrast  Result Date: 07/31/2023 CLINICAL DATA:  Syncope and emesis. Fall. History of endometrial cancer status post brachytherapy. EXAM: CT ANGIOGRAPHY CHEST WITH CONTRAST TECHNIQUE: Multidetector CT imaging of the chest was performed using the standard protocol during bolus administration of intravenous contrast. Multiplanar CT image reconstructions and MIPs were obtained to evaluate the vascular anatomy. RADIATION DOSE REDUCTION: This exam was performed according to the departmental  dose-optimization program which includes automated exposure control, adjustment of the mA and/or kV according to patient size and/or use of iterative reconstruction technique. CONTRAST:  75mL OMNIPAQUE IOHEXOL 350 MG/ML SOLN COMPARISON:  None Available. FINDINGS: Cardiovascular: Satisfactory opacification of the pulmonary arteries to the segmental level. No evidence of pulmonary embolism. Normal heart size. No pericardial effusion. There are atherosclerotic calcifications of the aorta. Mediastinum/Nodes: No enlarged  mediastinal, hilar, or axillary lymph nodes. Thyroid gland, trachea, and esophagus demonstrate no significant findings. Lungs/Pleura: Multifocal airspace and ground-glass opacities with slightly nodular component are seen in the bilateral lower lobes right middle lobe and left upper lobe. This is most significant in the left lower lobe. There is no pleural effusion or pneumothorax. Upper Abdomen: No acute abnormality. Musculoskeletal: No chest wall abnormality. No acute or significant osseous findings. Review of the MIP images confirms the above findings. IMPRESSION: 1. No evidence for pulmonary embolism. 2. Multifocal airspace and ground-glass opacities with slightly nodular component are seen in the bilateral lower lobes, right middle lobe and left upper lobe. This is most significant in the left lower lobe. Findings are favored as infectious/inflammatory. 3. Aortic atherosclerosis. Aortic Atherosclerosis (ICD10-I70.0). Electronically Signed   By: Darliss Cheney M.D.   On: 07/31/2023 22:35   DG Chest Portable 1 View  Result Date: 07/31/2023 CLINICAL DATA:  Vomiting and syncope. EXAM: PORTABLE CHEST 1 VIEW COMPARISON:  July 23, 2022 FINDINGS: The cardiac silhouette is mildly enlarged and unchanged in size. There is marked severity calcification of the aortic arch. Moderate severity diffusely increased interstitial lung markings are seen. Mild atelectasis and/or infiltrate is noted within the bilateral lung bases. No pleural effusion or pneumothorax are identified. Multilevel degenerative changes seen throughout the thoracic spine. IMPRESSION: 1. Moderate severity diffusely increased interstitial lung markings which may represent mild interstitial edema. 2. Mild bibasilar atelectasis and/or infiltrate. Electronically Signed   By: Aram Candela M.D.   On: 07/31/2023 22:01    Anti-infectives: Anti-infectives (From admission, onward)    Start     Dose/Rate Route Frequency Ordered Stop   08/01/23 1500   Ampicillin-Sulbactam (UNASYN) 3 g in sodium chloride 0.9 % 100 mL IVPB        3 g 200 mL/hr over 30 Minutes Intravenous Every 8 hours 08/01/23 1420     07/31/23 2315  cefTRIAXone (ROCEPHIN) 1 g in sodium chloride 0.9 % 100 mL IVPB  Status:  Discontinued        1 g 200 mL/hr over 30 Minutes Intravenous  Once 07/31/23 2313 07/31/23 2313   07/31/23 2315  azithromycin (ZITHROMAX) 500 mg in sodium chloride 0.9 % 250 mL IVPB        500 mg 250 mL/hr over 60 Minutes Intravenous  Once 07/31/23 2313 08/01/23 0118   07/31/23 2315  cefTRIAXone (ROCEPHIN) 2 g in sodium chloride 0.9 % 100 mL IVPB        2 g 200 mL/hr over 30 Minutes Intravenous  Once 07/31/23 2313 08/01/23 0005        Assessment/Plan SBO in setting of complex past surgical history with hostile abdomen -film overall looks improved -clinically improved with a massive diarrhea like BM yesterday.  + flatus as well -patient would like to try CLD with NGT in place first and then DC if tolerates.  Plan to DC around 1500 today if tolerates CLD well -d/w primary on ward   FEN - CLD, NGt clamped and to come out later today if tolerates, IVFs per primary VTE - Eliquis on  hold, may have chemical prophylaxis from our standpoint ID - unasyn for aspiration PNA  - per TRH -  Hx of endometrial CA A. Fib on eliquis CKD stage III HTN HLD T2DM GERD Gout   I reviewed hospitalist notes, last 24 h vitals and pain scores, last 48 h intake and output, last 24 h labs and trends, and last 24 h imaging results.   LOS: 1 day    [name] , [facility] 08/02/2023, 10:15 AM Please see Amion for pager number during day hours 7:00am-4:30pm or 7:00am -11:30am on weekends

## 2023-08-02 NOTE — Progress Notes (Cosign Needed Addendum)
ANTICOAGULATION CONSULT NOTE - Initial Consult  Pharmacy Consult for heparin Indication: atrial fibrillation  Allergies  Allergen Reactions   Lisinopril Cough    Other reaction(s): cough, Not available   Actos [Pioglitazone]     Unknown reaction   Metformin Other (See Comments)   Metformin And Related     Gas     Patient Measurements: Height: 5\' 4"  (162.6 cm) Weight: 130.2 kg (287 lb 0.6 oz) IBW/kg (Calculated) : 54.7 Heparin Dosing Weight: 90  Vital Signs: Temp: 98.4 F (36.9 C) (08/02 0800) Temp Source: Oral (08/02 0800) BP: 143/74 (08/02 0800) Pulse Rate: 116 (08/02 0800)  Labs: Recent Labs    07/31/23 2024 08/01/23 1415 08/02/23 0027  HGB 13.3 11.4* 11.5*  HCT 40.3 36.1 36.5  PLT 242 157 150  LABPROT 15.2  --   --   INR 1.2  --   --   CREATININE 1.69* 1.53* 1.42*    Estimated Creatinine Clearance: 46.6 mL/min (A) (by C-G formula based on SCr of 1.42 mg/dL (H)).   Medical History: Past Medical History:  Diagnosis Date   Atrial fibrillation (HCC)    persistent (outpt monitoring 08/2022)   Bilateral lower extremity edema    Carpal tunnel syndrome    bilateral, had surgery on right hand   Chronic renal insufficiency, stage 3 (moderate) (HCC)    GFR 50s   Diabetes mellitus without complication (HCC)    Dr. Sharl Ma   Diverticulosis    +hx of 'itis   Endometrial cancer [facility])    RT spring 2024   GERD (gastroesophageal reflux disease)    Gout    No problems after getting on allopurinol   Heart murmur    soft, systolic   History of blood transfusion    History of colon polyps    History of iron deficiency    Hyperlipidemia    Hypertension    Osteoarthritis, multiple sites    esp knees   Urinary incontinence     Medications:  Medications Prior to Admission  Medication Sig Dispense Refill Last Dose   acetaminophen (TYLENOL) 500 MG tablet Take 1,000 mg by mouth every 6 (six) hours as needed for moderate pain.   07/31/2023   allopurinol (ZYLOPRIM) 300  MG tablet Take 1 tablet (300 mg total) by mouth daily. 90 tablet 1 07/29/2023   amLODipine (NORVASC) 5 MG tablet Take 1 tablet (5 mg total) by mouth daily. (Patient taking differently: Take 5 mg by mouth every evening.) 90 tablet 1 07/29/2023   apixaban (ELIQUIS) 5 MG TABS tablet Take 1 tablet (5 mg total) by mouth 2 (two) times daily. 180 tablet 3 07/29/2023 at 8 pm   atorvastatin (LIPITOR) 80 MG tablet Take 1 tablet (80 mg total) by mouth at bedtime. 90 tablet 3 07/29/2023   carvedilol (COREG) 12.5 MG tablet Take 1 tablet (12.5 mg total) by mouth 2 (two) times daily with a meal. 180 tablet 3 07/29/2023   Cholecalciferol (VITAMIN D3 PO) Take 1 capsule by mouth daily.   07/29/2023   Coenzyme Q10 (COQ10) 100 MG CAPS Take 100 mg by mouth daily.   07/29/2023   famotidine (PEPCID) 20 MG tablet Take 20 mg by mouth daily.    07/29/2023   furosemide (LASIX) 40 MG tablet Take 2 tablets (80 mg) in the morning. (Patient taking differently: Take 80 mg by mouth daily. Take 2 tablets (80 mg) in the morning.) 270 tablet 3 07/29/2023   GLUCOSAMINE-CHONDROITIN PO Take 1 tablet by mouth 2 (two) times  daily.   07/29/2023   insulin NPH-regular Human (70-30) 100 UNIT/ML injection Inject 50-70 Units into the skin See admin instructions. 70 units In the morning and 50 units in the evening   07/29/2023   KRILL OIL PO Take 1 capsule by mouth daily.   07/29/2023   losartan (COZAAR) 50 MG tablet Take 1 tablet (50 mg total) by mouth daily. 90 tablet 0 07/29/2023   meclizine (ANTIVERT) 12.5 MG tablet Take 1 tablet (12.5 mg total) by mouth 3 (three) times daily as needed for dizziness. 30 tablet 0 Past Week   MELATONIN PO Take 10 mg by mouth at bedtime.   07/29/2023   Multiple Vitamins-Minerals (MULTIVITAMIN WITH MINERALS) tablet Take 1 tablet by mouth daily.   07/29/2023   potassium chloride (KLOR-CON) 10 MEQ tablet Take 1 tablet (10 mEq total) by mouth 2 (two) times daily. (Patient taking differently: Take 10 mEq by mouth daily.) 180 tablet  3 Past Week   Vibegron (GEMTESA) 75 MG TABS Take 1 tablet (75 mg total) by mouth daily. 30 tablet 1 Past Week   Scheduled:   carvedilol  12.5 mg Oral BID WC   insulin aspart  0-15 Units Subcutaneous Q4H   insulin aspart protamine- aspart  20 Units Subcutaneous BID WC   sodium chloride flush  3 mL Intravenous Q12H    Assessment: Patient is a 61 yof admitted for abdominal pain and SBO. Patient is on Apixaban at home. Last dose on 07/29/23. Apixaban was placed on hold during admission in the setting of possible surgical intervention. In the interim, will start heparin gtt per MD for her afib.   CBC stable  Goal of Therapy:  Heparin level 0.3-0.7 units/ml Monitor platelets by anticoagulation protocol: Yes   Plan:  Give 4000 units bolus x 1 Start heparin infusion at 1350 units/hr Check APTT and Heparin level in 8 hours and daily while on heparin Continue to monitor H&H and platelets Follow up on surgical plan     08/02/2023,11:01 AM

## 2023-08-02 NOTE — Progress Notes (Signed)
ANTICOAGULATION CONSULT NOTE - Initial Consult  Pharmacy Consult for heparin Indication: atrial fibrillation  Allergies  Allergen Reactions   Lisinopril Cough    Other reaction(s): cough, Not available   Actos [Pioglitazone]     Unknown reaction   Metformin Other (See Comments)   Metformin And Related     Gas     Patient Measurements: Height: 5\' 4"  (162.6 cm) Weight: 130.2 kg (287 lb 0.6 oz) IBW/kg (Calculated) : 54.7 Heparin Dosing Weight: 90  Vital Signs: Temp: 98.3 F (36.8 C) (08/02 2000) Temp Source: Oral (08/02 2000) BP: 131/56 (08/02 2000) Pulse Rate: 96 (08/02 2000)  Labs: Recent Labs    07/31/23 2024 08/01/23 1415 08/02/23 0027 08/02/23 1930  HGB 13.3 11.4* 11.5*  --   HCT 40.3 36.1 36.5  --   PLT 242 157 150  --   APTT  --   --   --  46*  LABPROT 15.2  --   --   --   INR 1.2  --   --   --   HEPARINUNFRC  --   --   --  0.81*  CREATININE 1.69* 1.53* 1.42*  --     Estimated Creatinine Clearance: 46.6 mL/min (A) (by C-G formula based on SCr of 1.42 mg/dL (H)).   Medical History: Past Medical History:  Diagnosis Date   Atrial fibrillation (HCC)    persistent (outpt monitoring 08/2022)   Bilateral lower extremity edema    Carpal tunnel syndrome    bilateral, had surgery on right hand   Chronic renal insufficiency, stage 3 (moderate) (HCC)    GFR 50s   Diabetes mellitus without complication (HCC)    Dr. Sharl Ma   Diverticulosis    +hx of 'itis   Endometrial cancer [facility])    RT spring 2024   GERD (gastroesophageal reflux disease)    Gout    No problems after getting on allopurinol   Heart murmur    soft, systolic   History of blood transfusion    History of colon polyps    History of iron deficiency    Hyperlipidemia    Hypertension    Osteoarthritis, multiple sites    esp knees   Urinary incontinence     Medications:  Medications Prior to Admission  Medication Sig Dispense Refill Last Dose   acetaminophen (TYLENOL) 500 MG tablet Take  1,000 mg by mouth every 6 (six) hours as needed for moderate pain.   07/31/2023   allopurinol (ZYLOPRIM) 300 MG tablet Take 1 tablet (300 mg total) by mouth daily. 90 tablet 1 07/29/2023   amLODipine (NORVASC) 5 MG tablet Take 1 tablet (5 mg total) by mouth daily. (Patient taking differently: Take 5 mg by mouth every evening.) 90 tablet 1 07/29/2023   apixaban (ELIQUIS) 5 MG TABS tablet Take 1 tablet (5 mg total) by mouth 2 (two) times daily. 180 tablet 3 07/29/2023 at 8 pm   atorvastatin (LIPITOR) 80 MG tablet Take 1 tablet (80 mg total) by mouth at bedtime. 90 tablet 3 07/29/2023   carvedilol (COREG) 12.5 MG tablet Take 1 tablet (12.5 mg total) by mouth 2 (two) times daily with a meal. 180 tablet 3 07/29/2023   Cholecalciferol (VITAMIN D3 PO) Take 1 capsule by mouth daily.   07/29/2023   Coenzyme Q10 (COQ10) 100 MG CAPS Take 100 mg by mouth daily.   07/29/2023   famotidine (PEPCID) 20 MG tablet Take 20 mg by mouth daily.    07/29/2023   furosemide (LASIX)  40 MG tablet Take 2 tablets (80 mg) in the morning. (Patient taking differently: Take 80 mg by mouth daily. Take 2 tablets (80 mg) in the morning.) 270 tablet 3 07/29/2023   GLUCOSAMINE-CHONDROITIN PO Take 1 tablet by mouth 2 (two) times daily.   07/29/2023   insulin NPH-regular Human (70-30) 100 UNIT/ML injection Inject 50-70 Units into the skin See admin instructions. 70 units In the morning and 50 units in the evening   07/29/2023   KRILL OIL PO Take 1 capsule by mouth daily.   07/29/2023   losartan (COZAAR) 50 MG tablet Take 1 tablet (50 mg total) by mouth daily. 90 tablet 0 07/29/2023   meclizine (ANTIVERT) 12.5 MG tablet Take 1 tablet (12.5 mg total) by mouth 3 (three) times daily as needed for dizziness. 30 tablet 0 Past Week   MELATONIN PO Take 10 mg by mouth at bedtime.   07/29/2023   Multiple Vitamins-Minerals (MULTIVITAMIN WITH MINERALS) tablet Take 1 tablet by mouth daily.   07/29/2023   potassium chloride (KLOR-CON) 10 MEQ tablet Take 1 tablet (10  mEq total) by mouth 2 (two) times daily. (Patient taking differently: Take 10 mEq by mouth daily.) 180 tablet 3 Past Week   Vibegron (GEMTESA) 75 MG TABS Take 1 tablet (75 mg total) by mouth daily. 30 tablet 1 Past Week   Scheduled:   carvedilol  12.5 mg Oral BID WC   insulin aspart  0-15 Units Subcutaneous Q4H   insulin aspart protamine- aspart  20 Units Subcutaneous BID WC   sodium chloride flush  3 mL Intravenous Q12H    Assessment: Patient is a 68 yoF admitted for abdominal pain and SBO. Patient is on Apixaban at home. Last dose on 07/29/23. Apixaban was placed on hold during admission in the setting of possible surgical intervention. In the interim, will start heparin gtt per MD for her afib.   8/2 evening: aPTT 46, heparin level 0.81, not yet correlating. Currently subtherapeutic based on aPTT.  Goal of Therapy:  Heparin level 0.3-0.7 units/ml Monitor platelets by anticoagulation protocol: Yes   Plan:  Give 4000 units bolus x 1 Increase heparin infusion to 1600 units/hr Check APTT and Heparin level in 8 hours and daily while on heparin Continue to monitor H&H and platelets Follow up on surgical plan  Rutherford Nail, PharmD PGY2 Critical Care Pharmacy Resident 08/02/2023,8:14 PM

## 2023-08-02 NOTE — Evaluation (Signed)
Physical Therapy Evaluation Patient Details Name: [name] MRN: [id number] DOB: [birth year] Today's Date: 08/02/2023  History of Present Illness  Pt is 73 yo female presenting to [facility] ER with abdominal pain/vomiting found to have SBO. Pt sustained a syncopal episode and subsequently aspirated vomit while in ED. PMH: HTN, HLD, HX prior colectomy/colostomy in late 1990's s/p reversal, endometrial cancer s/p TAH with BSO.  Clinical Impression   Pt is presenting slightly below baseline. Currently pt is Min A for supine to sitting and sit to stand. PLOF pt was Mod I for transfers and short distance gait mostly mobilizing with electric scooter and W/C. Due to pt current functional status, home set up, PLOF and available assistance at home no recommended skilled physical therapy services recommended at discharge. Pt will benefit from continued skilled physical therapy services while in acute setting due to diagnosis of SBO and to ensure pt returns home safely to decrease risk for falls, injury and re-hospitalization. Pt HR between 110-117 throughout session.       If plan is discharge home, recommend the following: Help with stairs or ramp for entrance;Assistance with cooking/housework   Can travel by private vehicle        Equipment Recommendations None recommended by PT  Recommendations for Other Services       Functional Status Assessment Patient has had a recent decline in their functional status and demonstrates the ability to make significant improvements in function in a reasonable and predictable amount of time.     Precautions / Restrictions Precautions Precautions: Fall Restrictions Weight Bearing Restrictions: No      Mobility  Bed Mobility Overal bed mobility: Needs Assistance Bed Mobility: Supine to Sit, Sit to Supine, Rolling Rolling: Modified independent (Device/Increase time)   Supine to sit: Min assist Sit to supine: Supervision   General bed mobility comments: Min A  for trunk to mid line due to weakness and poor body mechanics. Patient Response: Cooperative  Transfers Overall transfer level: Needs assistance Equipment used: None (used arm rest to chair) Transfers: Sit to/from Stand Sit to Stand: Min assist           General transfer comment: Min A due to instability with stepping and slighty impulsive, standing quickly wtih foot on sheet. Pt is incontinent in standing and gets upset trying to move quickly    Ambulation/Gait               General Gait Details: Due to pt current functional mobility and nausea did not progress gait this session. Pt was very unstable and slightly impulsive with standing as well as incontinent causing pt to move unsafe. Ambulates only short distances at baseline.  Stairs Stairs:  (not applicable pt has ramp at home.)          Wheelchair Mobility     Tilt Bed Tilt Bed Patient Response: Cooperative  Modified Rankin (Stroke Patients Only)       Balance Overall balance assessment: Needs assistance   Sitting balance-Leahy Scale: Good     Standing balance support: Reliant on assistive device for balance, Bilateral upper extremity supported Standing balance-Leahy Scale: Poor Standing balance comment: pt has poor standing balance                             Pertinent Vitals/Pain Pain Assessment Pain Assessment: No/denies pain    Home Living Family/patient expects to be discharged to:: Private residence Living Arrangements: Spouse/significant other;Children (daughter SIL) Available  Help at Discharge: Family;Available 24 hours/day;Available PRN/intermittently Type of Home: House Home Access: Ramped entrance       Home Layout: Two level;Able to live on main level with bedroom/bathroom Home Equipment: Grab bars - toilet;Grab bars - tub/shower;Shower seat;Other (comment);Electric scooter;Wheelchair - power (adjustable head/legs bed and tri walker)      Prior Function Prior Level  of Function : Independent/Modified Independent             Mobility Comments: uses scooter and power w/c pt is able to ambulate short distances with tri walker ADLs Comments: Mod I     Hand Dominance   Dominant Hand: Right    Extremity/Trunk Assessment   Upper Extremity Assessment Upper Extremity Assessment: Defer to OT evaluation    Lower Extremity Assessment Lower Extremity Assessment: Generalized weakness    Cervical / Trunk Assessment Cervical / Trunk Assessment: Normal  Communication   Communication: No difficulties  Cognition Arousal/Alertness: Awake/alert Behavior During Therapy: WFL for tasks assessed/performed Overall Cognitive Status: Within Functional Limits for tasks assessed        General Comments General comments (skin integrity, edema, etc.): Pt was incontinent in bed on entering room. Assisted with clean up. Pt was then incontinent when standing urinating on the floor.    Exercises     Assessment/Plan    PT Assessment Patient needs continued PT services  PT Problem List Decreased strength;Decreased balance;Decreased activity tolerance       PT Treatment Interventions      PT Goals (Current goals can be found in the Care Plan section)  Acute Rehab PT Goals Patient Stated Goal: to return home with family PT Goal Formulation: With patient Time For Goal Achievement: 08/16/23 Potential to Achieve Goals: Good    Frequency Min 1X/week     AM-PAC PT "6 Clicks" Mobility  Outcome Measure Help needed turning from your back to your side while in a flat bed without using bedrails?: None Help needed moving from lying on your back to sitting on the side of a flat bed without using bedrails?: A Little Help needed moving to and from a bed to a chair (including a wheelchair)?: A Little Help needed standing up from a chair using your arms (e.g., wheelchair or bedside chair)?: A Little Help needed to walk in hospital room?: A Lot Help needed climbing  3-5 steps with a railing? : Total 6 Click Score: 16    End of Session   Activity Tolerance: Patient tolerated treatment well;Other (comment) (limited due to continued incontinence of B and B) Patient left: in bed;with bed alarm set;with call bell/phone within reach Nurse Communication: Mobility status;Other (comment) (pt incontinence) PT Visit Diagnosis: Unsteadiness on feet (R26.81);Other abnormalities of gait and mobility (R26.89)    Time: 1610-9604 PT Time Calculation (min) (ACUTE ONLY): 30 min   Charges:   PT Evaluation $PT Eval Low Complexity: 1 Low PT Treatments $Therapeutic Activity: 8-22 mins PT General Charges $$ ACUTE PT VISIT: 1 Visit         Harrel Carina, DPT, CLT  Acute Rehabilitation Services Office: [phone] (Secure chat preferred)   Claudia Desanctis 08/02/2023, 2:08 PM

## 2023-08-03 DIAGNOSIS — T17908A Unspecified foreign body in respiratory tract, part unspecified causing other injury, initial encounter: Secondary | ICD-10-CM | POA: Diagnosis not present

## 2023-08-03 DIAGNOSIS — I4891 Unspecified atrial fibrillation: Secondary | ICD-10-CM | POA: Diagnosis not present

## 2023-08-03 DIAGNOSIS — K56609 Unspecified intestinal obstruction, unspecified as to partial versus complete obstruction: Secondary | ICD-10-CM | POA: Diagnosis not present

## 2023-08-03 DIAGNOSIS — J9601 Acute respiratory failure with hypoxia: Secondary | ICD-10-CM | POA: Diagnosis not present

## 2023-08-03 LAB — GLUCOSE, CAPILLARY
Glucose-Capillary: 155 mg/dL — ABNORMAL HIGH (ref 70–99)
Glucose-Capillary: 177 mg/dL — ABNORMAL HIGH (ref 70–99)
Glucose-Capillary: 179 mg/dL — ABNORMAL HIGH (ref 70–99)
Glucose-Capillary: 181 mg/dL — ABNORMAL HIGH (ref 70–99)
Glucose-Capillary: 182 mg/dL — ABNORMAL HIGH (ref 70–99)
Glucose-Capillary: 185 mg/dL — ABNORMAL HIGH (ref 70–99)
Glucose-Capillary: 201 mg/dL — ABNORMAL HIGH (ref 70–99)
Glucose-Capillary: 204 mg/dL — ABNORMAL HIGH (ref 70–99)

## 2023-08-03 LAB — PROCALCITONIN: Procalcitonin: 19.28 ng/mL

## 2023-08-03 MED ORDER — ATORVASTATIN CALCIUM 80 MG PO TABS
80.0000 mg | ORAL_TABLET | Freq: Every day | ORAL | Status: DC
  Administered 2023-08-03 – 2023-08-05 (×3): 80 mg via ORAL
  Filled 2023-08-03 (×3): qty 1

## 2023-08-03 MED ORDER — FUROSEMIDE 40 MG PO TABS
80.0000 mg | ORAL_TABLET | Freq: Every day | ORAL | Status: DC
  Administered 2023-08-03: 80 mg via ORAL
  Filled 2023-08-03: qty 2

## 2023-08-03 MED ORDER — APIXABAN 5 MG PO TABS
5.0000 mg | ORAL_TABLET | Freq: Two times a day (BID) | ORAL | Status: DC
  Administered 2023-08-03 – 2023-08-06 (×7): 5 mg via ORAL
  Filled 2023-08-03 (×7): qty 1

## 2023-08-03 MED ORDER — AMLODIPINE BESYLATE 5 MG PO TABS
5.0000 mg | ORAL_TABLET | Freq: Every day | ORAL | Status: DC
  Administered 2023-08-03 – 2023-08-06 (×4): 5 mg via ORAL
  Filled 2023-08-03 (×4): qty 1

## 2023-08-03 MED ORDER — GUAIFENESIN ER 600 MG PO TB12
600.0000 mg | ORAL_TABLET | Freq: Two times a day (BID) | ORAL | Status: DC
  Administered 2023-08-03 – 2023-08-06 (×7): 600 mg via ORAL
  Filled 2023-08-03 (×7): qty 1

## 2023-08-03 MED ORDER — HEPARIN BOLUS VIA INFUSION
4000.0000 [IU] | Freq: Once | INTRAVENOUS | Status: AC
  Administered 2023-08-03: 4000 [IU] via INTRAVENOUS
  Filled 2023-08-03: qty 4000

## 2023-08-03 NOTE — Progress Notes (Signed)
Patient ID: [name], female   DOB: 26-May-1950, 73 y.o.   MRN: [id number]      Subjective: Passing gas and loose stool with it, tol CL ROS negative except as listed above. Objective: Vital signs in last 24 hours: Temp:  [97.3 F (36.3 C)-98.9 F (37.2 C)] 98.9 F (37.2 C) (08/03 0820) Pulse Rate:  [96-118] 118 (08/03 0820) Resp:  [18-24] 20 (08/03 0820) BP: (131-156)/(56-83) 156/78 (08/03 0820) SpO2:  [91 %-95 %] 92 % (08/03 0820) Weight:  [131.1 kg] 131.1 kg (08/03 0500) Last BM Date : 08/02/23  Intake/Output from previous day: 08/02 0701 - 08/03 0700 In: 840 [P.O.:840] Out: 650 [Urine:650] Intake/Output this shift: No intake/output data recorded.  General appearance: alert and cooperative Resp: clear to auscultation bilaterally GI: soft, NT, mild dist  Lab Results: CBC  Recent Labs    08/02/23 0027 08/03/23 0534  WBC 10.4 11.8*  HGB 11.5* 11.0*  HCT 36.5 35.4*  PLT 150 150   BMET Recent Labs    08/02/23 0027 08/03/23 0534  NA 144 142  K 3.8 3.7  CL 102 102  CO2 27 28  GLUCOSE 177* 183*  BUN 39* 30*  CREATININE 1.42* 0.99  CALCIUM 8.7* 8.9   PT/INR Recent Labs    07/31/23 2024  LABPROT 15.2  INR 1.2   ABG No results for input(s): "PHART", "HCO3" in the last 72 hours.  Invalid input(s): "PCO2", "PO2"  Studies/Results: DG Abd Portable 1V-Small Bowel Obstruction Protocol-initial, 8 hr delay  Result Date: 08/02/2023 CLINICAL DATA:  Small bowel obstruction. EXAM: PORTABLE ABDOMEN - 1 VIEW COMPARISON:  CT from 07/31/2023 and abdomen radiograph from 08/01/2023. FINDINGS: Significantly diminished exam detail due to patient body habitus. There is a enteric tube with tip and side port in the stomach. Enteric contrast material is identified within a loop of bowel in the left lower quadrant of the abdomen, which appears to conform to the location of the sigmoid colon. Small amount of enteric contrast material is also noted within the right upper quadrant of  the abdomen location of which is difficult to confirm but may localized to the hepatic flexure. IMPRESSION: 1. Significantly diminished exam detail due to patient body habitus. 2. Enteric contrast material is identified within a loop of bowel in the left lower quadrant of the abdomen, which appears to conform to the location of the sigmoid colon. Small amount of enteric contrast material is also noted within the right upper quadrant of the abdomen location of which is difficult to confirm but may localized to the hepatic flexure. Due to the limitations described above a repeat CT of the abdomen pelvis may be helpful to confirm resolving small-bowel obstruction. Electronically Signed   By: Signa Kell M.D.   On: 08/02/2023 05:47    Anti-infectives: Anti-infectives (From admission, onward)    Start     Dose/Rate Route Frequency Ordered Stop   08/01/23 1500  Ampicillin-Sulbactam (UNASYN) 3 g in sodium chloride 0.9 % 100 mL IVPB        3 g 200 mL/hr over 30 Minutes Intravenous Every 8 hours 08/01/23 1420     07/31/23 2315  cefTRIAXone (ROCEPHIN) 1 g in sodium chloride 0.9 % 100 mL IVPB  Status:  Discontinued        1 g 200 mL/hr over 30 Minutes Intravenous  Once 07/31/23 2313 07/31/23 2313   07/31/23 2315  azithromycin (ZITHROMAX) 500 mg in sodium chloride 0.9 % 250 mL IVPB  500 mg 250 mL/hr over 60 Minutes Intravenous  Once 07/31/23 2313 08/01/23 0118   07/31/23 2315  cefTRIAXone (ROCEPHIN) 2 g in sodium chloride 0.9 % 100 mL IVPB        2 g 200 mL/hr over 30 Minutes Intravenous  Once 07/31/23 2313 08/01/23 0005       Assessment/Plan: SBO in setting of complex past surgical history with hostile abdomen -improving, advance to fulls  FEN - full liquid diet VTE - Eliquis on hold, may have chemical prophylaxis from our standpoint ID - unasyn for aspiration PNA  - per TRH -  Hx of endometrial CA A. Fib on eliquis CKD stage III HTN HLD T2DM GERD Gout    LOS: 2 days    Violeta Gelinas, MD, MPH, FACS Trauma & General Surgery Use [email or web address] to contact on call provider  08/03/2023

## 2023-08-03 NOTE — Discharge Instructions (Signed)

## 2023-08-03 NOTE — Plan of Care (Signed)

## 2023-08-03 NOTE — Progress Notes (Signed)
PROGRESS NOTE        PATIENT DETAILS Name: [name] Age: 73 y.o. Sex: female Date of Birth: [birth year] Admit Date: 07/31/2023 Admitting Physician Synetta Fail, MD [id number], Maryjean Morn, MD  Brief Summary: Patient is a 73 y.o.  female with history of HTN, HLD, history of prior colectomy/colostomy in the late 1990s-s/p reversal, endometrial cancer-s/p TAH with BSO-presenting with abdominal pain/vomiting-found to have SBO.  While in the ED-during one of the episodes of vomiting-patient sustained a syncopal episode and subsequently aspirated vomitus.  Patient was then admitted to the hospitalist service for further evaluation and treatment.  Significant events: 7/31>> admit to [facility]  Significant studies: 7/31>> CT abdomen/pelvis: SBO with transition point right lower quadrant. 7/31>> CT angio chest: No PE, multifocal PNA 8/02>> x-ray abdomen SBO protocol: Contrast in sigmoid colon.  Significant microbiology data: 7/31>> blood culture: No growth  Procedures: None  Consults: General surgery  Subjective: Feels better-had BM overnight.  Tolerating clear liquids.  Objective: Vitals: Blood pressure (!) 156/78, pulse (!) 118, temperature 98.9 F (37.2 C), temperature source Oral, resp. rate 20, height 5\' 4"  (1.626 m), weight 131.1 kg, SpO2 92%.   Exam: Gen Exam:Alert awake-not in any distress HEENT:atraumatic, normocephalic Chest: B/L clear to auscultation anteriorly CVS:S1S2 regular Abdomen:soft non tender, non distended Extremities:no edema Neurology: Non focal Skin: no rash  Pertinent Labs/Radiology:    Latest Ref Rng & Units 08/03/2023    5:34 AM 08/02/2023   12:27 AM 08/01/2023    2:15 PM  CBC  WBC 4.0 - 10.5 K/uL 11.8  10.4  10.7   Hemoglobin 12.0 - 15.0 g/dL 09.8  11.9  14.7   Hematocrit 36.0 - 46.0 % 35.4  36.5  36.1   Platelets 150 - 400 K/uL 150  150  157     Lab Results  Component Value Date   NA 142 08/03/2023   K 3.7  08/03/2023   CL 102 08/03/2023   CO2 28 08/03/2023     Assessment/Plan: SBO Likely secondary to adhesions/scar tissue Improving-passing flatus-had BM last night Diet being advanced-NG tube removed. Await further recommendations from general surgery.  Acute hypoxic respiratory failure secondary to aspiration pneumonia Overall improved-continue Unasyn-discussed with nursing staff-titrate down FiO2 as tolerated. Follow cultures.   Syncope This occurred with vomiting-likely vasovagal-furthermore-patient has history of syncope in the past associated with vomiting episodes. Continue telemetry monitoring Recent echo September 2023 with stable EF.  AKI Hemodynamically mediated Improving with supportive care Avoid nephrotoxic agents  PAF Continue Coreg Stop IV heparin-and resume Eliquis.    Chronic HFpEF Resume furosemide-volume status remains relatively stable.  HTN BP on the higher side Continue Coreg-restarted amlodipine   HLD Statin resumed as patient tolerating advancement in diet.  DM-2 Follow CBGs Continue insulin 70/30 20 units twice daily (reduced dose)-and follow CBG trend.  Recent Labs    08/03/23 0021 08/03/23 0345 08/03/23 0819  GLUCAP 182* 177* 179*    3.6 cm left ovarian simple appearing cyst Radiology recommending follow-up ultrasound in 6 to 12 months  History of endometrial cancer Follow-up with GYN oncology  Obesity: Estimated body mass index is 49.61 kg/m as calculated from the following:   Height as of this encounter: 5\' 4"  (1.626 m).   Weight as of this encounter: 131.1 kg.   Code status:   Code Status: Full Code   DVT Prophylaxis: SCDs  Start: 08/01/23 1401 apixaban (ELIQUIS) tablet 5 mg    Family Communication: Spouse-Robert-[phone] -updated over the phone 8/3   Disposition Plan: Status is: Inpatient Remains inpatient appropriate because: Severity of illness   Planned Discharge Destination:Home health   Diet: Diet  Order             Diet clear liquid Fluid consistency: Thin  Diet effective now                     Antimicrobial agents: Anti-infectives (From admission, onward)    Start     Dose/Rate Route Frequency Ordered Stop   08/01/23 1500  Ampicillin-Sulbactam (UNASYN) 3 g in sodium chloride 0.9 % 100 mL IVPB        3 g 200 mL/hr over 30 Minutes Intravenous Every 8 hours 08/01/23 1420     07/31/23 2315  cefTRIAXone (ROCEPHIN) 1 g in sodium chloride 0.9 % 100 mL IVPB  Status:  Discontinued        1 g 200 mL/hr over 30 Minutes Intravenous  Once 07/31/23 2313 07/31/23 2313   07/31/23 2315  azithromycin (ZITHROMAX) 500 mg in sodium chloride 0.9 % 250 mL IVPB        500 mg 250 mL/hr over 60 Minutes Intravenous  Once 07/31/23 2313 08/01/23 0118   07/31/23 2315  cefTRIAXone (ROCEPHIN) 2 g in sodium chloride 0.9 % 100 mL IVPB        2 g 200 mL/hr over 30 Minutes Intravenous  Once 07/31/23 2313 08/01/23 0005        MEDICATIONS: Scheduled Meds:  amLODipine  5 mg Oral Daily   apixaban  5 mg Oral BID   atorvastatin  80 mg Oral QHS   carvedilol  12.5 mg Oral BID WC   furosemide  80 mg Oral Daily   insulin aspart  0-15 Units Subcutaneous Q4H   insulin aspart protamine- aspart  20 Units Subcutaneous BID WC   sodium chloride flush  3 mL Intravenous Q12H   Continuous Infusions:  ampicillin-sulbactam (UNASYN) IV 3 g (08/03/23 0708)   PRN Meds:.acetaminophen **OR** acetaminophen   I have personally reviewed following labs and imaging studies  LABORATORY DATA: CBC: Recent Labs  Lab 07/31/23 2024 08/01/23 1415 08/02/23 0027 08/03/23 0534  WBC 9.5 10.7* 10.4 11.8*  NEUTROABS 8.5*  --   --   --   HGB 13.3 11.4* 11.5* 11.0*  HCT 40.3 36.1 36.5 35.4*  MCV 101.0* 105.9* 106.1* 106.9*  PLT 242 157 150 150    Basic Metabolic Panel: Recent Labs  Lab 07/31/23 2024 08/01/23 1415 08/02/23 0027 08/03/23 0534  NA 141 142 144 142  K 5.0 4.1 3.8 3.7  CL 102 101 102 102  CO2 26 29  27 28   GLUCOSE 181* 195* 177* 183*  BUN 29* 38* 39* 30*  CREATININE 1.69* 1.53* 1.42* 0.99  CALCIUM 9.9 8.5* 8.7* 8.9    GFR: Estimated Creatinine Clearance: 67.1 mL/min (by C-G formula based on SCr of 0.99 mg/dL).  Liver Function Tests: Recent Labs  Lab 07/31/23 2024 08/01/23 1415 08/02/23 0027 08/03/23 0534  AST 40 17 21 28   ALT 13 15 16 20   ALKPHOS 85 86 88 117  BILITOT 1.1 1.1 0.8 0.9  PROT 7.0 6.1* 6.3* 6.3*  ALBUMIN 4.0 3.1* 3.1* 2.9*   No results for input(s): "LIPASE", "AMYLASE" in the last 168 hours. No results for input(s): "AMMONIA" in the last 168 hours.  Coagulation Profile: Recent Labs  Lab 07/31/23 2024  INR 1.2    Cardiac Enzymes: No results for input(s): "CKTOTAL", "CKMB", "CKMBINDEX", "TROPONINI" in the last 168 hours.  BNP (last 3 results) Recent Labs    10/16/22 1316 11/01/22 1126 12/19/22 1354  PROBNP 1,363* 1,748* 1,229*    Lipid Profile: No results for input(s): "CHOL", "HDL", "LDLCALC", "TRIG", "CHOLHDL", "LDLDIRECT" in the last 72 hours.  Thyroid Function Tests: No results for input(s): "TSH", "T4TOTAL", "FREET4", "T3FREE", "THYROIDAB" in the last 72 hours.  Anemia Panel: No results for input(s): "VITAMINB12", "FOLATE", "FERRITIN", "TIBC", "IRON", "RETICCTPCT" in the last 72 hours.  Urine analysis:    Component Value Date/Time   COLORURINE YELLOW 08/17/2019 1215   APPEARANCEUR CLEAR 08/17/2019 1215   LABSPEC 1.012 08/17/2019 1215   PHURINE 5.0 08/17/2019 1215   GLUCOSEU NEGATIVE 08/17/2019 1215   HGBUR NEGATIVE 08/17/2019 1215   BILIRUBINUR NEGATIVE 08/17/2019 1215   KETONESUR NEGATIVE 08/17/2019 1215   PROTEINUR NEGATIVE 08/17/2019 1215   NITRITE NEGATIVE 08/17/2019 1215   LEUKOCYTESUR SMALL (A) 08/17/2019 1215    Sepsis Labs: Lactic Acid, Venous    Component Value Date/Time   LATICACIDVEN 2.3 (HH) 08/01/2023 1133    MICROBIOLOGY: Recent Results (from the past 240 hour(s))  Culture, blood (Routine x 2)      Status: None (Preliminary result)   Collection Time: 07/31/23  8:24 PM   Specimen: BLOOD  Result Value Ref Range Status   Specimen Description   Final    BLOOD LEFT ANTECUBITAL Performed at [facility], [address] [place], [place] [address]    Special Requests   Final    BOTTLES DRAWN AEROBIC AND ANAEROBIC Blood Culture adequate volume Performed at [facility], [address] [place], [place] [address]    Culture   Final    NO GROWTH 3 DAYS Performed at [facility], [address] [place], [place] [address]    Report Status PENDING  Incomplete  Culture, blood (Routine x 2)     Status: None (Preliminary result)   Collection Time: 07/31/23  8:53 PM   Specimen: BLOOD  Result Value Ref Range Status   Specimen Description   Final    BLOOD BLOOD LEFT ARM Performed at [facility], [address] [place], [place] [address]    Special Requests   Final    BOTTLES DRAWN AEROBIC AND ANAEROBIC Blood Culture results may not be optimal due to an excessive volume of blood received in culture bottles Performed at [facility], [address] [place], [place] [address]    Culture   Final    NO GROWTH 3 DAYS Performed at [facility], [address], [place], [place] [address]    Report Status PENDING  Incomplete    RADIOLOGY STUDIES/RESULTS: DG Abd Portable 1V-Small Bowel Obstruction Protocol-initial, 8 hr delay  Result Date: 08/02/2023 CLINICAL DATA:  Small bowel obstruction. EXAM: PORTABLE ABDOMEN - 1 VIEW COMPARISON:  CT from 07/31/2023 and abdomen radiograph from 08/01/2023. FINDINGS: Significantly diminished exam detail due to patient body habitus. There is a enteric tube with tip and side port in the stomach. Enteric contrast material is identified within a loop of bowel in the left lower quadrant of the abdomen, which appears to conform to the location of the sigmoid colon. Small amount  of enteric contrast material is also noted within the right upper quadrant of the abdomen location of which is difficult to confirm but may localized to the hepatic flexure. IMPRESSION: 1. Significantly diminished exam detail  due to patient body habitus. 2. Enteric contrast material is identified within a loop of bowel in the left lower quadrant of the abdomen, which appears to conform to the location of the sigmoid colon. Small amount of enteric contrast material is also noted within the right upper quadrant of the abdomen location of which is difficult to confirm but may localized to the hepatic flexure. Due to the limitations described above a repeat CT of the abdomen pelvis may be helpful to confirm resolving small-bowel obstruction. Electronically Signed   By: Signa Kell M.D.   On: 08/02/2023 05:47     LOS: 2 days   Jeoffrey Massed, MD  [facility]    To contact the attending provider between 7A-7P or the covering provider during after hours 7P-7A, please log into the web site [email or web address] and access using universal [place] password for that web site. If you do not have the password, please call the hospital operator.  08/03/2023, 11:07 AM

## 2023-08-03 NOTE — Progress Notes (Addendum)
Physical Therapy Treatment Patient Details Name: [name] MRN: [id number] DOB: July 09, 1950 Today's Date: 08/03/2023   History of Present Illness Pt is 73 yo female presenting to [facility] ER with abdominal pain/vomiting found to have SBO. Pt sustained a syncopal episode and subsequently aspirated vomit while in ED. PMH: HTN, HLD, HX prior colectomy/colostomy in late 1990's s/p reversal, endometrial cancer s/p TAH with BSO.    PT Comments  Pt supine in bed on arrival.  She is soiled of bowel and bladder in bed.  Pt required mobilization OOB to bedside commode to complete BM.  Post BM assisted patient with pericare and moved commode to recliner. Pt continues to present with decreased functional capacity, increased work of breathing and decreased strength.  Will update recommendation to home with HHPT to maximize functional gains and improve endurance in the home setting to allow her to rely less on WC for mobility.       If plan is discharge home, recommend the following: Help with stairs or ramp for entrance;Assistance with cooking/housework   Can travel by private vehicle        Equipment Recommendations  None recommended by PT    Recommendations for Other Services       Precautions / Restrictions Precautions Precautions: Fall Restrictions Weight Bearing Restrictions: No     Mobility  Bed Mobility Overal bed mobility: Needs Assistance Bed Mobility: Supine to Sit Rolling: Modified independent (Device/Increase time)         General bed mobility comments: Increased time with heavy use of bedrail.    Transfers Overall transfer level: Needs assistance Equipment used: Rolling walker (2 wheels) Transfers: Sit to/from Stand, Bed to chair/wheelchair/BSC Sit to Stand: Min guard, Min assist (min guard from elevated bed. Min assistance from commode as it was narrow and need assistance with hips.) Stand pivot transfers: Min guard         General transfer comment: Cues for hand  placement to and from seated surface.  Performed from bed x 2, commode and then to recliner chair.    Ambulation/Gait Ambulation/Gait assistance: Min assist Gait Distance (Feet): 5 Feet   Gait Pattern/deviations: Step-to pattern, Trunk flexed, Shuffle Gait velocity: slowed     General Gait Details: pt flexed over RW with cues to keep R elbow off RW and use hand on device.  Pt fatigues quickly as she reports using WC at home and only ambulates short distances.  Cues for posture and RW safety with backing and turning.   Stairs             Wheelchair Mobility     Tilt Bed    Modified Rankin (Stroke Patients Only)       Balance     Sitting balance-Leahy Scale: Good       Standing balance-Leahy Scale: Poor                              Cognition Arousal/Alertness: Awake/alert Behavior During Therapy: WFL for tasks assessed/performed Overall Cognitive Status: Within Functional Limits for tasks assessed                                          Exercises General Exercises - Lower Extremity Ankle Circles/Pumps: AROM, Both, 20 reps, Supine Quad Sets: AROM, Both, 10 reps, Supine Heel Slides: Both, 10 reps, Supine, AROM Hip ABduction/ADduction:  Both, 10 reps, AROM, Supine Straight Leg Raises: AROM, Both, 10 reps, Supine    General Comments        Pertinent Vitals/Pain Pain Assessment Pain Assessment: 0-10 Pain Score: 3  Pain Location: abdomen Pain Descriptors / Indicators:  (bloated) Pain Intervention(s): Monitored during session, Repositioned    Home Living                          Prior Function            PT Goals (current goals can now be found in the care plan section) Acute Rehab PT Goals Patient Stated Goal: to return home with family Potential to Achieve Goals: Good Progress towards PT goals: Progressing toward goals    Frequency    Min 1X/week      PT Plan Discharge plan needs to be updated     Co-evaluation              AM-PAC PT "6 Clicks" Mobility   Outcome Measure  Help needed turning from your back to your side while in a flat bed without using bedrails?: None Help needed moving from lying on your back to sitting on the side of a flat bed without using bedrails?: A Little Help needed moving to and from a bed to a chair (including a wheelchair)?: A Little Help needed standing up from a chair using your arms (e.g., wheelchair or bedside chair)?: A Little Help needed to walk in hospital room?: A Lot Help needed climbing 3-5 steps with a railing? : A Little 6 Click Score: 18    End of Session Equipment Utilized During Treatment: Gait belt Activity Tolerance: Patient tolerated treatment well Patient left: in chair;with call bell/phone within reach;with chair alarm set Nurse Communication: Mobility status;Other (comment) (pt was incontinent on arrival.) PT Visit Diagnosis: Unsteadiness on feet (R26.81);Other abnormalities of gait and mobility (R26.89)     Time: 8119-1478 PT Time Calculation (min) (ACUTE ONLY): 37 min  Charges:    $Therapeutic Exercise: 8-22 mins $Therapeutic Activity: 8-22 mins PT General Charges $$ ACUTE PT VISIT: 1 Visit                     Bonney Leitz , PTA Acute Rehabilitation Services Office [phone]     Artis Delay 08/03/2023, 1:51 PM

## 2023-08-03 NOTE — Evaluation (Signed)
Occupational Therapy Evaluation Patient Details Name: [name] MRN: [id number] DOB: Dec 19, 1950 Today's Date: 08/03/2023   History of Present Illness Pt is 73 yo female presenting to [facility] ER with abdominal pain/vomiting found to have SBO. Pt sustained a syncopal episode and subsequently aspirated vomit while in ED. PMH: HTN, HLD, HX prior colectomy/colostomy in late 1990's s/p reversal, endometrial cancer s/p TAH with BSO. Pt also reports recent Left carpal tunnel surgery.   Clinical Impression   At baseline, pt completes ADLs Independent to Mod I and functional transfers/mobility with a scooter, power wheelchair, or tri walker with Mod I. At baseline, pt performs light meal prep and receives assistance form family for other home management tasks as needed. Pt now presents with decreased activity tolerance, decreased B UE strength, decreased dynamic standing balance during functional tasks, and decreased safety and independence with ADLs and functional transfers/mobility. Pt currently demonstrates ability to complete UB ADLs Independent to Min assist, LB ADLs with Mod to Max assist, and functional transfers with a RW with Min guard-Min assist. Pt will benefit from acute skilled OT services to address deficits outlined below, decrease caregiver burden, and increase safety and independence with ADLs and functional transfers. Post acute discharge, pt will benefit from continued skilled OT services in the home to maximize rehab potential.      Recommendations for follow up therapy are one component of a multi-disciplinary discharge planning process, led by the attending physician.  Recommendations may be updated based on patient status, additional functional criteria and insurance authorization.   Assistance Recommended at Discharge Intermittent Supervision/Assistance  Patient can return home with the following A little help with walking and/or transfers;A lot of help with  bathing/dressing/bathroom;Assistance with cooking/housework;Assist for transportation;Help with stairs or ramp for entrance    Functional Status Assessment  Patient has had a recent decline in their functional status and demonstrates the ability to make significant improvements in function in a reasonable and predictable amount of time.  Equipment Recommendations  None recommended by OT    Recommendations for Other Services       Precautions / Restrictions Precautions Precautions: Fall Restrictions Weight Bearing Restrictions: No      Mobility Bed Mobility               General bed mobility comments: Pt sitting in recliner at beginning and end of session    Transfers Overall transfer level: Needs assistance Equipment used: Rolling walker (2 wheels) Transfers: Sit to/from Stand, Bed to chair/wheelchair/BSC Sit to Stand: Min guard, Min assist Stand pivot transfers: Min guard, Min assist         General transfer comment: Cues for hand placement. Pt with decreased activity tolerance and with brief O2 desat into upper 80s in standing.      Balance Overall balance assessment: Needs assistance Sitting-balance support: No upper extremity supported, Feet supported Sitting balance-Leahy Scale: Good     Standing balance support: Bilateral upper extremity supported, During functional activity, Reliant on assistive device for balance Standing balance-Leahy Scale: Poor                             ADL either performed or assessed with clinical judgement   ADL Overall ADL's : Needs assistance/impaired Eating/Feeding: Independent;Sitting   Grooming: Independent;Sitting   Upper Body Bathing: Minimal assistance;Sitting   Lower Body Bathing: Moderate assistance;Sitting/lateral leans;Sit to/from stand   Upper Body Dressing : Min guard;Sitting Upper Body Dressing Details (indicate cue type and  reason): assist for managing telemetry wires Lower Body Dressing:  Moderate assistance;Sitting/lateral leans;Sit to/from stand   Toilet Transfer: Min guard;Minimal assistance;Stand-pivot;BSC/3in1;Rolling walker (2 wheels) Toilet Transfer Details (indicate cue type and reason): Cues for hand placement. Pt with decreased activity tolerance and with brief O2 desat into upper 80s in standing. Toileting- Clothing Manipulation and Hygiene: Maximal assistance;Sit to/from stand Toileting - Clothing Manipulation Details (indicate cue type and reason): Pt with episode of incontinence upon standing and requiring Max assist for toileting hygeine.       General ADL Comments: Functional mobility deferred this session secondary to pt with significantly decreased activity tolerance and O2 desat into the upper 80s with standing while on 5L continuous O2 through nasal cannula.     Vision Baseline Vision/History: 1 Wears glasses;4 Cataracts (readers; has cataract surgery planned) Ability to See in Adequate Light: 1 Impaired Patient Visual Report: No change from baseline       Perception     Praxis      Pertinent Vitals/Pain Pain Assessment Pain Assessment: 0-10 Pain Score: 2  Pain Location: abdomen Pain Descriptors / Indicators: Aching Pain Intervention(s): Monitored during session, Repositioned, Limited activity within patient's tolerance     Hand Dominance Right   Extremity/Trunk Assessment Upper Extremity Assessment Upper Extremity Assessment: Generalized weakness   Lower Extremity Assessment Lower Extremity Assessment: Defer to PT evaluation   Cervical / Trunk Assessment Cervical / Trunk Assessment: Normal   Communication Communication Communication: No difficulties   Cognition Arousal/Alertness: Awake/alert Behavior During Therapy: WFL for tasks assessed/performed Overall Cognitive Status: Within Functional Limits for tasks assessed                                 General Comments: Pt AAOx4 and pleasant throughout session.      General Comments  Pt with brief O2 destat into upper 80s in standing while on 5L continuous O2 through nasal cannula throughout session. All other VSS throughout session. Pt with episode of urinary incontinance upon standing.    Exercises     Shoulder Instructions      Home Living Family/patient expects to be discharged to:: Private residence Living Arrangements: Spouse/significant other;Children (Daughter, SIL) Available Help at Discharge: Family;Available 24 hours/day;Available PRN/intermittently Type of Home: House Home Access: Ramped entrance     Home Layout: Two level;Able to live on main level with bedroom/bathroom     Bathroom Shower/Tub: Producer, television/film/video: Handicapped height Bathroom Accessibility: Yes How Accessible: Accessible via wheelchair Home Equipment: Grab bars - toilet;Grab bars - tub/shower;Other (comment);Electric scooter;Wheelchair - power;Rollator (4 wheels);Shower seat;Hand held shower head (tri-walker, upright walker)          Prior Functioning/Environment Prior Level of Function : Independent/Modified Independent             Mobility Comments: Pt typically uses scooter and power w/c and is able to ambulate short distances with tri walker. ADLs Comments: Pt is Mod I with bathing, dressing, toileting, and light meal prep. Family assists with other IADLs and transportation. Pt reports she does not wear socks at baseline and wears slip on shoes.        OT Problem List: Decreased strength;Decreased activity tolerance;Impaired balance (sitting and/or standing);Cardiopulmonary status limiting activity      OT Treatment/Interventions: Self-care/ADL training;Therapeutic exercise;Energy conservation;DME and/or AE instruction;Therapeutic activities;Patient/family education;Balance training    OT Goals(Current goals can be found in the care plan section) Acute Rehab OT Goals Patient  Stated Goal: To stay independent and be able to use her  walker more OT Goal Formulation: With patient Potential to Achieve Goals: Good ADL Goals Pt Will Perform Lower Body Bathing: with min guard assist;with adaptive equipment;sit to/from stand;sitting/lateral leans Pt Will Perform Lower Body Dressing: with min guard assist;with adaptive equipment;sit to/from stand;sitting/lateral leans Pt Will Transfer to Toilet: with modified independence;bedside commode;ambulating (with least restrictive AD) Pt Will Perform Toileting - Clothing Manipulation and hygiene: with min guard assist;with adaptive equipment;sit to/from stand;sitting/lateral leans Pt/caregiver will Perform Home Exercise Program: Increased strength;Both right and left upper extremity;With theraband;With theraputty;Independently;With written HEP provided (Increased activity tolerance) Additional ADL Goal #1: Patient will demonstrate ability to Independently state 4 energy conservation strategies to increase safety and independence with functional activities in the home.  OT Frequency: Min 1X/week    Co-evaluation              AM-PAC OT "6 Clicks" Daily Activity     Outcome Measure Help from another person eating meals?: None Help from another person taking care of personal grooming?: None Help from another person toileting, which includes using toliet, bedpan, or urinal?: A Lot Help from another person bathing (including washing, rinsing, drying)?: A Lot Help from another person to put on and taking off regular upper body clothing?: A Little Help from another person to put on and taking off regular lower body clothing?: A Lot 6 Click Score: 17   End of Session Equipment Utilized During Treatment: Rolling walker (2 wheels);Oxygen;Other (comment) [facility]) Nurse Communication: Mobility status;Other (comment) (Pt with brief O2 desat in standing. Pt with episode of incontinence in standing. Pt requires new Purewick.)  Activity Tolerance: Treatment limited secondary to medical complications  (Comment);Patient tolerated treatment well (Pt limited by O2 destat in standing) Patient left: in chair;with call bell/phone within reach;with chair alarm set  OT Visit Diagnosis: Unsteadiness on feet (R26.81);Other abnormalities of gait and mobility (R26.89);Muscle weakness (generalized) (M62.81);Other (comment) (Decreased activity tolerance)                Time: 7425-9563 OT Time Calculation (min): 58 min Charges:  OT General Charges $OT Visit: 1 Visit OT Evaluation $OT Eval Low Complexity: 1 Low OT Treatments $Self Care/Home Management : 23-37 mins   "Orson Eva., OTR/L, MA Acute Rehab [phone]   Lendon Colonel 08/03/2023, 5:30 PM

## 2023-08-03 NOTE — Progress Notes (Addendum)
ANTICOAGULATION CONSULT NOTE - Initial Consult  Pharmacy Consult for heparin>>apixaban Indication: atrial fibrillation  Allergies  Allergen Reactions   Lisinopril Cough    Other reaction(s): cough, Not available   Actos [Pioglitazone]     Unknown reaction   Metformin Other (See Comments)   Metformin And Related     Gas     Patient Measurements: Height: 5\' 4"  (162.6 cm) Weight: 131.1 kg (289 lb 0.4 oz) IBW/kg (Calculated) : 54.7 Heparin Dosing Weight: 90  Vital Signs: Temp: 98.4 F (36.9 C) (08/03 0300) Temp Source: Oral (08/03 0300) BP: 147/78 (08/03 0300) Pulse Rate: 106 (08/03 0000)  Labs: Recent Labs    07/31/23 2024 08/01/23 1415 08/02/23 0027 08/02/23 1930 08/03/23 0534  HGB 13.3 11.4* 11.5*  --  11.0*  HCT 40.3 36.1 36.5  --  35.4*  PLT 242 157 150  --  150  APTT  --   --   --  46* 47*  LABPROT 15.2  --   --   --   --   INR 1.2  --   --   --   --   HEPARINUNFRC  --   --   --  0.81* 0.71*  CREATININE 1.69* 1.53* 1.42*  --  0.99    Estimated Creatinine Clearance: 67.1 mL/min (by C-G formula based on SCr of 0.99 mg/dL).   Medical History: Past Medical History:  Diagnosis Date   Atrial fibrillation (HCC)    persistent (outpt monitoring 08/2022)   Bilateral lower extremity edema    Carpal tunnel syndrome    bilateral, had surgery on right hand   Chronic renal insufficiency, stage 3 (moderate) (HCC)    GFR 50s   Diabetes mellitus without complication (HCC)    Dr. Sharl Ma   Diverticulosis    +hx of 'itis   Endometrial cancer [facility])    RT spring 2024   GERD (gastroesophageal reflux disease)    Gout    No problems after getting on allopurinol   Heart murmur    soft, systolic   History of blood transfusion    History of colon polyps    History of iron deficiency    Hyperlipidemia    Hypertension    Osteoarthritis, multiple sites    esp knees   Urinary incontinence     Medications:  Medications Prior to Admission  Medication Sig Dispense Refill  Last Dose   acetaminophen (TYLENOL) 500 MG tablet Take 1,000 mg by mouth every 6 (six) hours as needed for moderate pain.   07/31/2023   allopurinol (ZYLOPRIM) 300 MG tablet Take 1 tablet (300 mg total) by mouth daily. 90 tablet 1 07/29/2023   amLODipine (NORVASC) 5 MG tablet Take 1 tablet (5 mg total) by mouth daily. (Patient taking differently: Take 5 mg by mouth every evening.) 90 tablet 1 07/29/2023   apixaban (ELIQUIS) 5 MG TABS tablet Take 1 tablet (5 mg total) by mouth 2 (two) times daily. 180 tablet 3 07/29/2023 at 8 pm   atorvastatin (LIPITOR) 80 MG tablet Take 1 tablet (80 mg total) by mouth at bedtime. 90 tablet 3 07/29/2023   carvedilol (COREG) 12.5 MG tablet Take 1 tablet (12.5 mg total) by mouth 2 (two) times daily with a meal. 180 tablet 3 07/29/2023   Cholecalciferol (VITAMIN D3 PO) Take 1 capsule by mouth daily.   07/29/2023   Coenzyme Q10 (COQ10) 100 MG CAPS Take 100 mg by mouth daily.   07/29/2023   famotidine (PEPCID) 20 MG tablet Take 20  mg by mouth daily.    07/29/2023   furosemide (LASIX) 40 MG tablet Take 2 tablets (80 mg) in the morning. (Patient taking differently: Take 80 mg by mouth daily. Take 2 tablets (80 mg) in the morning.) 270 tablet 3 07/29/2023   GLUCOSAMINE-CHONDROITIN PO Take 1 tablet by mouth 2 (two) times daily.   07/29/2023   insulin NPH-regular Human (70-30) 100 UNIT/ML injection Inject 50-70 Units into the skin See admin instructions. 70 units In the morning and 50 units in the evening   07/29/2023   KRILL OIL PO Take 1 capsule by mouth daily.   07/29/2023   losartan (COZAAR) 50 MG tablet Take 1 tablet (50 mg total) by mouth daily. 90 tablet 0 07/29/2023   meclizine (ANTIVERT) 12.5 MG tablet Take 1 tablet (12.5 mg total) by mouth 3 (three) times daily as needed for dizziness. 30 tablet 0 Past Week   MELATONIN PO Take 10 mg by mouth at bedtime.   07/29/2023   Multiple Vitamins-Minerals (MULTIVITAMIN WITH MINERALS) tablet Take 1 tablet by mouth daily.   07/29/2023    potassium chloride (KLOR-CON) 10 MEQ tablet Take 1 tablet (10 mEq total) by mouth 2 (two) times daily. (Patient taking differently: Take 10 mEq by mouth daily.) 180 tablet 3 Past Week   Vibegron (GEMTESA) 75 MG TABS Take 1 tablet (75 mg total) by mouth daily. 30 tablet 1 Past Week   Scheduled:   carvedilol  12.5 mg Oral BID WC   heparin  4,000 Units Intravenous Once   insulin aspart  0-15 Units Subcutaneous Q4H   insulin aspart protamine- aspart  20 Units Subcutaneous BID WC   sodium chloride flush  3 mL Intravenous Q12H    Assessment: Patient is a 8 yoF admitted for abdominal pain and SBO. Patient is on Apixaban at home. Last dose on 07/29/23. Apixaban was placed on hold during admission in the setting of possible surgical intervention. In the interim, will start heparin gtt per MD for her afib.   Heparin level and PTT are not correlating yet. PTT came back subtherapeutic this AM. No issue per Rn. We will re-bolus again and increase rate.   Addendum  Ok to resume apixaban per Dr Jerral Ralph  Goal of Therapy:  Monitor platelets by anticoagulation protocol: Yes   Plan:  Resume apixaban 5mg  BID Rx will follow peripherally  Ulyses Southward, PharmD, BCIDP, AAHIVP, CPP Infectious Disease Pharmacist 08/03/2023 7:06 AM

## 2023-08-04 ENCOUNTER — Inpatient Hospital Stay (HOSPITAL_COMMUNITY): Payer: Medicare Other

## 2023-08-04 DIAGNOSIS — J9601 Acute respiratory failure with hypoxia: Secondary | ICD-10-CM | POA: Diagnosis not present

## 2023-08-04 DIAGNOSIS — T17908A Unspecified foreign body in respiratory tract, part unspecified causing other injury, initial encounter: Secondary | ICD-10-CM | POA: Diagnosis not present

## 2023-08-04 DIAGNOSIS — I4891 Unspecified atrial fibrillation: Secondary | ICD-10-CM | POA: Diagnosis not present

## 2023-08-04 DIAGNOSIS — K56609 Unspecified intestinal obstruction, unspecified as to partial versus complete obstruction: Secondary | ICD-10-CM | POA: Diagnosis not present

## 2023-08-04 LAB — GLUCOSE, CAPILLARY
Glucose-Capillary: 135 mg/dL — ABNORMAL HIGH (ref 70–99)
Glucose-Capillary: 155 mg/dL — ABNORMAL HIGH (ref 70–99)
Glucose-Capillary: 169 mg/dL — ABNORMAL HIGH (ref 70–99)
Glucose-Capillary: 190 mg/dL — ABNORMAL HIGH (ref 70–99)
Glucose-Capillary: 210 mg/dL — ABNORMAL HIGH (ref 70–99)
Glucose-Capillary: 210 mg/dL — ABNORMAL HIGH (ref 70–99)

## 2023-08-04 LAB — CBC
HCT: 34.1 % — ABNORMAL LOW (ref 36.0–46.0)
Hemoglobin: 10.7 g/dL — ABNORMAL LOW (ref 12.0–15.0)
MCH: 32.3 pg (ref 26.0–34.0)
MCHC: 31.4 g/dL (ref 30.0–36.0)
MCV: 103 fL — ABNORMAL HIGH (ref 80.0–100.0)
Platelets: 162 10*3/uL (ref 150–400)
RBC: 3.31 MIL/uL — ABNORMAL LOW (ref 3.87–5.11)
RDW: 14.6 % (ref 11.5–15.5)
WBC: 8.6 10*3/uL (ref 4.0–10.5)
nRBC: 0 % (ref 0.0–0.2)

## 2023-08-04 LAB — BASIC METABOLIC PANEL WITH GFR
Anion gap: 12 (ref 5–15)
BUN: 25 mg/dL — ABNORMAL HIGH (ref 8–23)
CO2: 28 mmol/L (ref 22–32)
Calcium: 9 mg/dL (ref 8.9–10.3)
Chloride: 101 mmol/L (ref 98–111)
Creatinine, Ser: 0.97 mg/dL (ref 0.44–1.00)
GFR, Estimated: >60 mL/min (ref >60)
Glucose, Bld: 157 mg/dL — ABNORMAL HIGH (ref 70–99)
Potassium: 4.1 mmol/L (ref 3.5–5.1)
Sodium: 141 mmol/L (ref 135–145)

## 2023-08-04 LAB — BRAIN NATRIURETIC PEPTIDE: B Natriuretic Peptide: 346.9 pg/mL — ABNORMAL HIGH (ref 0.0–100.0)

## 2023-08-04 MED ORDER — FUROSEMIDE 10 MG/ML IJ SOLN
60.0000 mg | Freq: Once | INTRAMUSCULAR | Status: AC
  Administered 2023-08-04: 60 mg via INTRAVENOUS
  Filled 2023-08-04: qty 6

## 2023-08-04 MED ORDER — LOSARTAN POTASSIUM 50 MG PO TABS
50.0000 mg | ORAL_TABLET | Freq: Every day | ORAL | Status: DC
  Administered 2023-08-04 – 2023-08-06 (×3): 50 mg via ORAL
  Filled 2023-08-04 (×3): qty 1

## 2023-08-04 MED ORDER — ACETAMINOPHEN 650 MG RE SUPP: 650.0000 mg | Freq: Four times a day (QID) | RECTAL | Status: DC | PRN

## 2023-08-04 MED ORDER — ACETAMINOPHEN 325 MG PO TABS: 650.0000 mg | ORAL_TABLET | Freq: Four times a day (QID) | ORAL | Status: DC | PRN

## 2023-08-04 MED ORDER — FUROSEMIDE 40 MG PO TABS: 80.0000 mg | ORAL_TABLET | Freq: Every day | ORAL | Status: DC

## 2023-08-04 NOTE — Plan of Care (Signed)

## 2023-08-04 NOTE — Progress Notes (Signed)
Occupational Therapy Treatment Patient Details Name: [name] MRN: [id number] DOB: 05/06/1950 Today's Date: 08/04/2023   History of present illness Pt is 73 yo female presenting to [facility] ER with abdominal pain/vomiting found to have SBO. Pt sustained a syncopal episode and subsequently aspirated vomit while in ED. PMH: HTN, HLD, HX prior colectomy/colostomy in late 1990's s/p reversal, endometrial cancer s/p TAH with BSO. Pt also reports recent Left carpal tunnel surgery.   OT comments  Pt agreeable to participation in skilled OT session. OT educated pt and her daughter in and provided handout related to energy conservation techniques with pt and daughter verbalizing understanding and pt demonstrating understanding through teach back. Pt will benefit from reinforcement of education. Pt currently performing UB ADLs Independent to Min assist and LB ADLs with Mod to Max assist with pt limited by decreased activity tolerance and current cardiopulmonary condition. Pt participated well in session and is making progress toward OT goals. Pt will benefit from continued acute skilled OT services to address deficits outlined below and increase safety and independence with ADLs and functional transfers/mobility. Post acute discharge, pt will benefit from continued skilled OT services in the home to maximize rehab potential.      Recommendations for follow up therapy are one component of a multi-disciplinary discharge planning process, led by the attending physician.  Recommendations may be updated based on patient status, additional functional criteria and insurance authorization.    Assistance Recommended at Discharge Intermittent Supervision/Assistance  Patient can return home with the following  A little help with walking and/or transfers;A lot of help with bathing/dressing/bathroom;Assistance with cooking/housework;Assist for transportation;Help with stairs or ramp for entrance   Equipment Recommendations   None recommended by OT    Recommendations for Other Services      Precautions / Restrictions Precautions Precautions: Fall Restrictions Weight Bearing Restrictions: No       Mobility Bed Mobility Overal bed mobility: Needs Assistance Bed Mobility: Rolling Rolling: Modified independent (Device/Increase time)              Transfers Overall transfer level: Needs assistance                 General transfer comment: Pt declining OOB activity this session secondary to daughter visiting and plan to perform bath with NT at end of visit with daughter.     Balance                                           ADL either performed or assessed with clinical judgement   ADL Overall ADL's : Needs assistance/impaired                                       General ADL Comments: Pt currently performing UB ADLs Independent to Min assit and LB ADLs with Mod to Max assist with pt limited by decreased activity tolerance and cardiopulmonary status. OT educated pt and her daughter in and provided handout regarding energy conservation techniques for increased safety and independence with functional tasks in the home. Pt and daughter verbalized understanding and pt demonstrated understanding through teach back. Pt will benefit from reinforcement of education.    Extremity/Trunk Assessment Upper Extremity Assessment Upper Extremity Assessment: Generalized weakness   Lower Extremity Assessment Lower Extremity Assessment: Defer to PT evaluation  Vision       Perception     Praxis      Cognition Arousal/Alertness: Awake/alert Behavior During Therapy: WFL for tasks assessed/performed Overall Cognitive Status: Within Functional Limits for tasks assessed                                 General Comments: Pt AAOx4 and pleasant throughout session.        Exercises      Shoulder Instructions       General Comments Pt with  brief feeling of not being able to "catch my breath" during converstation with OT and daughter from bed level with HOB elevated. Pt's O2 sat remained >96% on 3L conitnuous O2 through nasal cannul and remained >94% on 3L throughout session. When pt felt unable to catch her breath, her HR increased to 110 bpm with pt HR quickly recovering in the to upper 80-low 90s. Pt's daughter present througout session. NT present during a portion of session.    Pertinent Vitals/ Pain       Pain Assessment Pain Assessment: Faces Pain Score: 2  Pain Location: abdomen Pain Descriptors / Indicators: Aching Pain Intervention(s): Monitored during session, Limited activity within patient's tolerance  Home Living                                          Prior Functioning/Environment              Frequency  Min 1X/week        Progress Toward Goals  OT Goals(current goals can now be found in the care plan section)  Progress towards OT goals: Progressing toward goals  Acute Rehab OT Goals Patient Stated Goal: To be able to do more without getting tired and to not need O2  Plan Discharge plan remains appropriate;Frequency remains appropriate    Co-evaluation                 AM-PAC OT "6 Clicks" Daily Activity     Outcome Measure   Help from another person eating meals?: None Help from another person taking care of personal grooming?: None Help from another person toileting, which includes using toliet, bedpan, or urinal?: A Lot Help from another person bathing (including washing, rinsing, drying)?: A Lot Help from another person to put on and taking off regular upper body clothing?: A Little Help from another person to put on and taking off regular lower body clothing?: A Lot 6 Click Score: 17    End of Session Equipment Utilized During Treatment: Oxygen  OT Visit Diagnosis: Muscle weakness (generalized) (M62.81);Other (comment) (Decreased activity tolerance)    Activity Tolerance Patient tolerated treatment well   Patient Left in bed;with call bell/phone within reach;with bed alarm set;with family/visitor present   Nurse Communication Mobility status;Other (comment) (Pt with brief episode of tachycardia with pt reproting difficulty catching her breath and O2 sat remaining stable on 3L continuous O2 through nasal cannula.)        Time: 9604-5409 OT Time Calculation (min): 27 min  Charges: OT General Charges $OT Visit: 1 Visit OT Treatments $Self Care/Home Management : 23-37 mins   "Orson Eva., OTR/L, MA Acute Rehab [phone]   Lendon Colonel 08/04/2023, 3:56 PM

## 2023-08-04 NOTE — Progress Notes (Signed)
Patient ID: [name], female   DOB: 1950/11/21, 73 y.o.   MRN: [id number]      Subjective: More BMs, tolerated fulls, no abd pain ROS negative except as listed above. Objective: Vital signs in last 24 hours: Temp:  [98.1 F (36.7 C)-98.7 F (37.1 C)] 98.3 F (36.8 C) (08/04 0829) Pulse Rate:  [92-107] 97 (08/04 0829) Resp:  [18-23] 19 (08/04 0829) BP: (143-158)/(63-86) 150/77 (08/04 0829) SpO2:  [94 %-98 %] 95 % (08/04 0829) Weight:  [131.2 kg] 131.2 kg (08/04 0500) Last BM Date : 08/04/23  Intake/Output from previous day: 08/03 0701 - 08/04 0700 In: -  Out: 1150 [Urine:1150] Intake/Output this shift: No intake/output data recorded.  General appearance: alert and cooperative Resp: clear to auscultation bilaterally GI: soft, less distended, NT  Lab Results: CBC  Recent Labs    08/03/23 0534 08/04/23 0306  WBC 11.8* 8.6  HGB 11.0* 10.7*  HCT 35.4* 34.1*  PLT 150 162   BMET Recent Labs    08/03/23 0534 08/04/23 0306  NA 142 141  K 3.7 4.1  CL 102 101  CO2 28 28  GLUCOSE 183* 157*  BUN 30* 25*  CREATININE 0.99 0.97  CALCIUM 8.9 9.0   PT/INR No results for input(s): "LABPROT", "INR" in the last 72 hours. ABG No results for input(s): "PHART", "HCO3" in the last 72 hours.  Invalid input(s): "PCO2", "PO2"  Studies/Results: DG Chest Port 1 View  Result Date: 08/04/2023 CLINICAL DATA:  141880 SOB (shortness of breath) 141880 EXAM: PORTABLE CHEST - 1 VIEW COMPARISON:  07/31/2023 FINDINGS: Some increase in coarse interstitial and airspace opacities involving bases more than apices. Heart size upper limits normal. Aortic Atherosclerosis (ICD10-170.0). Blunting of left lateral costophrenic angle suggesting small effusion. Visualized bones unremarkable. IMPRESSION: Increasing coarse interstitial and airspace opacities. Electronically Signed   By: Corlis Leak M.D.   On: 08/04/2023 09:58    Anti-infectives: Anti-infectives (From admission, onward)    Start      Dose/Rate Route Frequency Ordered Stop   08/01/23 1500  Ampicillin-Sulbactam (UNASYN) 3 g in sodium chloride 0.9 % 100 mL IVPB        3 g 200 mL/hr over 30 Minutes Intravenous Every 8 hours 08/01/23 1420     07/31/23 2315  cefTRIAXone (ROCEPHIN) 1 g in sodium chloride 0.9 % 100 mL IVPB  Status:  Discontinued        1 g 200 mL/hr over 30 Minutes Intravenous  Once 07/31/23 2313 07/31/23 2313   07/31/23 2315  azithromycin (ZITHROMAX) 500 mg in sodium chloride 0.9 % 250 mL IVPB        500 mg 250 mL/hr over 60 Minutes Intravenous  Once 07/31/23 2313 08/01/23 0118   07/31/23 2315  cefTRIAXone (ROCEPHIN) 2 g in sodium chloride 0.9 % 100 mL IVPB        2 g 200 mL/hr over 30 Minutes Intravenous  Once 07/31/23 2313 08/01/23 0005       Assessment/Plan: SBO in setting of complex past surgical history with hostile abdomen -improving, advance to soft -OK to D/C from surgical standpoint  FEN - soft VTE - OK to resume Eliquis from our standpoint ID - unasyn for aspiration PNA  - per TRH -  Hx of endometrial CA A. Fib on eliquis CKD stage III HTN HLD T2DM GERD Gout    LOS: 3 days    Violeta Gelinas, MD, MPH, FACS Trauma & General Surgery Use [email or web address] to contact on call provider  08/04/2023

## 2023-08-04 NOTE — Progress Notes (Signed)
Pharmacy Antibiotic Note  [name] is a 73 y.o. female admitted on 07/31/2023 with aspiration pneumonia.  Pharmacy has been consulted for Unasyn dosing.   Pt has been on D4 of Unasyn for aspiration PNA. Her wbc is wnl and has remained AF. CXR was ordered this AM and still pending. O2 is around 4-5L. Scr down ~1.  Plan: Cont Unasyn 3g Q8H  Height: 5\' 4"  (162.6 cm) Weight: 131.2 kg (289 lb 3.9 oz) IBW/kg (Calculated) : 54.7  Temp (24hrs), Avg:98.5 F (36.9 C), Min:98.1 F (36.7 C), Max:98.9 F (37.2 C)  Recent Labs  Lab 07/31/23 2024 07/31/23 2210 08/01/23 1133 08/01/23 1415 08/02/23 0027 08/03/23 0534 08/04/23 0306  WBC 9.5  --   --  10.7* 10.4 11.8* 8.6  CREATININE 1.69*  --   --  1.53* 1.42* 0.99 0.97  LATICACIDVEN 2.8* 2.2* 2.3*  --   --   --   --     Estimated Creatinine Clearance: 68.5 mL/min (by C-G formula based on SCr of 0.97 mg/dL).    Allergies  Allergen Reactions   Lisinopril Cough    Other reaction(s): cough, Not available   Actos [Pioglitazone]     Unknown reaction   Metformin Other (See Comments)   Metformin And Related     Gas     Antimicrobials this admission: CTX x 1 on 7/31 pm Azithro IV x1 on 8/1 Unasyn 8/1 >>  Microbiology results: 7/31 BCx: ngtd  Ulyses Southward, PharmD, BCIDP, AAHIVP, CPP Infectious Disease Pharmacist 08/04/2023 7:40 AM

## 2023-08-04 NOTE — Progress Notes (Signed)
Ok to resume losartan for HTN per Dr. Jerral Ralph.  Ulyses Southward, PharmD, BCIDP, AAHIVP, CPP Infectious Disease Pharmacist 08/04/2023 8:53 AM

## 2023-08-04 NOTE — Progress Notes (Signed)
PROGRESS NOTE        PATIENT DETAILS Name: [name] Age: 73 y.o. Sex: female Date of Birth: 1950/11/08 Admit Date: 07/31/2023 Admitting Physician Synetta Fail, MD [id number], Maryjean Morn, MD  Brief Summary: Patient is a 73 y.o.  female with history of HTN, HLD, history of prior colectomy/colostomy in the late 1990s-s/p reversal, endometrial cancer-s/p TAH with BSO-presenting with abdominal pain/vomiting-found to have SBO.  While in the ED-during one of the episodes of vomiting-patient sustained a syncopal episode and subsequently aspirated vomitus.  Patient was then admitted to the hospitalist service for further evaluation and treatment.  Significant events: 7/31>> admit to [facility]  Significant studies: 7/31>> CT abdomen/pelvis: SBO with transition point right lower quadrant. 7/31>> CT angio chest: No PE, multifocal PNA 8/02>> x-ray abdomen SBO protocol: Contrast in sigmoid colon. 8/04>> CXR: Increasing coarse/interstitial/airspace opacities  Significant microbiology data: 7/31>> blood culture: No growth  Procedures: None  Consults: General surgery  Subjective: Some short of breath-titrated down to 3 L.  Passing flatus-had BM again last night.  Objective: Vitals: Blood pressure (!) 150/77, pulse 97, temperature 98.3 F (36.8 C), temperature source Oral, resp. rate 19, height 5\' 4"  (1.626 m), weight 131.2 kg, SpO2 95%.   Exam: Gen Exam:Alert awake-not in any distress HEENT:atraumatic, normocephalic Chest: Bibasilar rales CVS:S1S2 regular Abdomen:soft non tender, non distended Extremities: Trace edema Neurology: Non focal Skin: no rash  Pertinent Labs/Radiology:    Latest Ref Rng & Units 08/04/2023    3:06 AM 08/03/2023    5:34 AM 08/02/2023   12:27 AM  CBC  WBC 4.0 - 10.5 K/uL 8.6  11.8  10.4   Hemoglobin 12.0 - 15.0 g/dL 35.5  73.2  20.2   Hematocrit 36.0 - 46.0 % 34.1  35.4  36.5   Platelets 150 - 400 K/uL 162  150  150     Lab  Results  Component Value Date   NA 141 08/04/2023   K 4.1 08/04/2023   CL 101 08/04/2023   CO2 28 08/04/2023     Assessment/Plan: SBO Likely secondary to adhesions/scar tissue Resolved with supportive care-NG tube discontinued several days back. Diet being advanced  Acute hypoxic respiratory failure secondary to aspiration pneumonia Slowly improving-titrate down FiO2 3 L today Continue Unasyn  Mobilize.  Syncope This occurred with vomiting-likely vasovagal-furthermore-patient has history of syncope in the past associated with vomiting episodes. Continue telemetry monitoring Recent echo September 2023 with stable EF.  AKI Hemodynamically mediated Improving with supportive care Avoid nephrotoxic agents  PAF Continue Coreg Eliquis  Acute on chronic HFpEF Bibasilar rales today-some mild leg edema-given 1 dose of IV Lasix today-resume usual oral Lasix regimen tomorrow.  Continue beta-blocker/resume losartan  HTN BP on the higher side Continue Coreg/amlodipine-losartan resumed today.  HLD Statin.  DM-2 Follow CBGs Continue insulin 70/30 20 units twice daily (reduced dose)-and follow CBG trend.  Recent Labs    08/03/23 2331 08/04/23 0406 08/04/23 0805  GLUCAP 155* 155* 169*    3.6 cm left ovarian simple appearing cyst Radiology recommending follow-up ultrasound in 6 to 12 months  History of endometrial cancer Follow-up with GYN oncology  Obesity: Estimated body mass index is 49.65 kg/m as calculated from the following:   Height as of this encounter: 5\' 4"  (1.626 m).   Weight as of this encounter: 131.2 kg.   Code status:   Code Status:  Full Code   DVT Prophylaxis: SCDs Start: 08/01/23 1401 apixaban (ELIQUIS) tablet 5 mg    Family Communication: Spouse-Robert-[phone] -updated over the phone 8/3   Disposition Plan: Status is: Inpatient Remains inpatient appropriate because: Severity of illness   Planned Discharge Destination:Home  health   Diet: Diet Order             DIET SOFT Room service appropriate? Yes; Fluid consistency: Thin  Diet effective now                     Antimicrobial agents: Anti-infectives (From admission, onward)    Start     Dose/Rate Route Frequency Ordered Stop   08/01/23 1500  Ampicillin-Sulbactam (UNASYN) 3 g in sodium chloride 0.9 % 100 mL IVPB        3 g 200 mL/hr over 30 Minutes Intravenous Every 8 hours 08/01/23 1420     07/31/23 2315  cefTRIAXone (ROCEPHIN) 1 g in sodium chloride 0.9 % 100 mL IVPB  Status:  Discontinued        1 g 200 mL/hr over 30 Minutes Intravenous  Once 07/31/23 2313 07/31/23 2313   07/31/23 2315  azithromycin (ZITHROMAX) 500 mg in sodium chloride 0.9 % 250 mL IVPB        500 mg 250 mL/hr over 60 Minutes Intravenous  Once 07/31/23 2313 08/01/23 0118   07/31/23 2315  cefTRIAXone (ROCEPHIN) 2 g in sodium chloride 0.9 % 100 mL IVPB        2 g 200 mL/hr over 30 Minutes Intravenous  Once 07/31/23 2313 08/01/23 0005        MEDICATIONS: Scheduled Meds:  amLODipine  5 mg Oral Daily   apixaban  5 mg Oral BID   atorvastatin  80 mg Oral QHS   carvedilol  12.5 mg Oral BID WC   [START ON 08/05/2023] furosemide  80 mg Oral Daily   guaiFENesin  600 mg Oral BID   insulin aspart  0-15 Units Subcutaneous Q4H   insulin aspart protamine- aspart  20 Units Subcutaneous BID WC   losartan  50 mg Oral Daily   sodium chloride flush  3 mL Intravenous Q12H   Continuous Infusions:  ampicillin-sulbactam (UNASYN) IV 3 g (08/04/23 0636)   PRN Meds:.acetaminophen **OR** acetaminophen   I have personally reviewed following labs and imaging studies  LABORATORY DATA: CBC: Recent Labs  Lab 07/31/23 2024 08/01/23 1415 08/02/23 0027 08/03/23 0534 08/04/23 0306  WBC 9.5 10.7* 10.4 11.8* 8.6  NEUTROABS 8.5*  --   --   --   --   HGB 13.3 11.4* 11.5* 11.0* 10.7*  HCT 40.3 36.1 36.5 35.4* 34.1*  MCV 101.0* 105.9* 106.1* 106.9* 103.0*  PLT 242 157 150 150 162     Basic Metabolic Panel: Recent Labs  Lab 07/31/23 2024 08/01/23 1415 08/02/23 0027 08/03/23 0534 08/04/23 0306  NA 141 142 144 142 141  K 5.0 4.1 3.8 3.7 4.1  CL 102 101 102 102 101  CO2 26 29 27 28 28   GLUCOSE 181* 195* 177* 183* 157*  BUN 29* 38* 39* 30* 25*  CREATININE 1.69* 1.53* 1.42* 0.99 0.97  CALCIUM 9.9 8.5* 8.7* 8.9 9.0    GFR: Estimated Creatinine Clearance: 68.5 mL/min (by C-G formula based on SCr of 0.97 mg/dL).  Liver Function Tests: Recent Labs  Lab 07/31/23 2024 08/01/23 1415 08/02/23 0027 08/03/23 0534  AST 40 17 21 28   ALT 13 15 16 20   ALKPHOS 85 86 88 117  BILITOT 1.1 1.1 0.8 0.9  PROT 7.0 6.1* 6.3* 6.3*  ALBUMIN 4.0 3.1* 3.1* 2.9*   No results for input(s): "LIPASE", "AMYLASE" in the last 168 hours. No results for input(s): "AMMONIA" in the last 168 hours.  Coagulation Profile: Recent Labs  Lab 07/31/23 2024  INR 1.2    Cardiac Enzymes: No results for input(s): "CKTOTAL", "CKMB", "CKMBINDEX", "TROPONINI" in the last 168 hours.  BNP (last 3 results) Recent Labs    10/16/22 1316 11/01/22 1126 12/19/22 1354  PROBNP 1,363* 1,748* 1,229*    Lipid Profile: No results for input(s): "CHOL", "HDL", "LDLCALC", "TRIG", "CHOLHDL", "LDLDIRECT" in the last 72 hours.  Thyroid Function Tests: No results for input(s): "TSH", "T4TOTAL", "FREET4", "T3FREE", "THYROIDAB" in the last 72 hours.  Anemia Panel: No results for input(s): "VITAMINB12", "FOLATE", "FERRITIN", "TIBC", "IRON", "RETICCTPCT" in the last 72 hours.  Urine analysis:    Component Value Date/Time   COLORURINE YELLOW 08/17/2019 1215   APPEARANCEUR CLEAR 08/17/2019 1215   LABSPEC 1.012 08/17/2019 1215   PHURINE 5.0 08/17/2019 1215   GLUCOSEU NEGATIVE 08/17/2019 1215   HGBUR NEGATIVE 08/17/2019 1215   BILIRUBINUR NEGATIVE 08/17/2019 1215   KETONESUR NEGATIVE 08/17/2019 1215   PROTEINUR NEGATIVE 08/17/2019 1215   NITRITE NEGATIVE 08/17/2019 1215   LEUKOCYTESUR SMALL (A)  08/17/2019 1215    Sepsis Labs: Lactic Acid, Venous    Component Value Date/Time   LATICACIDVEN 2.3 (HH) 08/01/2023 1133    MICROBIOLOGY: Recent Results (from the past 240 hour(s))  Culture, blood (Routine x 2)     Status: None (Preliminary result)   Collection Time: 07/31/23  8:24 PM   Specimen: BLOOD  Result Value Ref Range Status   Specimen Description   Final    BLOOD LEFT ANTECUBITAL Performed at [facility], [address], [place], [place] [address]    Special Requests   Final    BOTTLES DRAWN AEROBIC AND ANAEROBIC Blood Culture adequate volume Performed at [facility], [address], [place], [place] [address]    Culture   Final    NO GROWTH 4 DAYS Performed at [facility], [address] [place], [place] [address]    Report Status PENDING  Incomplete  Culture, blood (Routine x 2)     Status: None (Preliminary result)   Collection Time: 07/31/23  8:53 PM   Specimen: BLOOD  Result Value Ref Range Status   Specimen Description   Final    BLOOD BLOOD LEFT ARM Performed at [facility], [address], [place], [place] [address]    Special Requests   Final    BOTTLES DRAWN AEROBIC AND ANAEROBIC Blood Culture results may not be optimal due to an excessive volume of blood received in culture bottles Performed at [facility], [address], [place], [place] [address]    Culture   Final    NO GROWTH 4 DAYS Performed at [facility], [address] [place], [place] [address]    Report Status PENDING  Incomplete    RADIOLOGY STUDIES/RESULTS: DG Chest Port 1 View  Result Date: 08/04/2023 CLINICAL DATA:  141880 SOB (shortness of breath) 141880 EXAM: PORTABLE CHEST - 1 VIEW COMPARISON:  07/31/2023 FINDINGS: Some increase in coarse interstitial and airspace opacities involving bases more than apices. Heart size upper limits normal. Aortic Atherosclerosis (ICD10-170.0).  Blunting of left lateral costophrenic angle suggesting small effusion. Visualized bones unremarkable. IMPRESSION: Increasing coarse interstitial and airspace opacities. Electronically Signed   By: Algis Downs  Deanne Coffer M.D.   On: 08/04/2023 09:58     LOS: 3 days   Jeoffrey Massed, MD  [facility]    To contact the attending provider between 7A-7P or the covering provider during after hours 7P-7A, please log into the web site [email or web address] and access using universal [place] password for that web site. If you do not have the password, please call the hospital operator.  08/04/2023, 10:55 AM

## 2023-08-05 DIAGNOSIS — I4891 Unspecified atrial fibrillation: Secondary | ICD-10-CM | POA: Diagnosis not present

## 2023-08-05 DIAGNOSIS — T17908A Unspecified foreign body in respiratory tract, part unspecified causing other injury, initial encounter: Secondary | ICD-10-CM | POA: Diagnosis not present

## 2023-08-05 DIAGNOSIS — K56609 Unspecified intestinal obstruction, unspecified as to partial versus complete obstruction: Secondary | ICD-10-CM | POA: Diagnosis not present

## 2023-08-05 DIAGNOSIS — J9601 Acute respiratory failure with hypoxia: Secondary | ICD-10-CM | POA: Diagnosis not present

## 2023-08-05 LAB — GLUCOSE, CAPILLARY
Glucose-Capillary: 173 mg/dL — ABNORMAL HIGH (ref 70–99)
Glucose-Capillary: 229 mg/dL — ABNORMAL HIGH (ref 70–99)
Glucose-Capillary: 217 mg/dL — ABNORMAL HIGH (ref 70–99)
Glucose-Capillary: 203 mg/dL — ABNORMAL HIGH (ref 70–99)

## 2023-08-05 MED ORDER — INSULIN ASPART 100 UNIT/ML IJ SOLN
0.0000 [IU] | Freq: Every day | INTRAMUSCULAR | Status: DC
  Administered 2023-08-05: 2 [IU] via SUBCUTANEOUS

## 2023-08-05 MED ORDER — INSULIN ASPART 100 UNIT/ML IJ SOLN
0.0000 [IU] | Freq: Three times a day (TID) | INTRAMUSCULAR | Status: DC
  Administered 2023-08-05 (×2): 5 [IU] via SUBCUTANEOUS
  Administered 2023-08-05 – 2023-08-06 (×3): 3 [IU] via SUBCUTANEOUS

## 2023-08-05 MED ORDER — FUROSEMIDE 40 MG PO TABS
80.0000 mg | ORAL_TABLET | Freq: Every day | ORAL | Status: DC
  Administered 2023-08-06: 80 mg via ORAL
  Filled 2023-08-05: qty 2

## 2023-08-05 MED ORDER — ALUM & MAG HYDROXIDE-SIMETH 200-200-20 MG/5ML PO SUSP
30.0000 mL | ORAL | Status: DC | PRN
  Administered 2023-08-05: 30 mL via ORAL
  Filled 2023-08-05: qty 30

## 2023-08-05 MED ORDER — POTASSIUM CHLORIDE CRYS ER 20 MEQ PO TBCR
20.0000 meq | EXTENDED_RELEASE_TABLET | Freq: Once | ORAL | Status: AC
  Administered 2023-08-05: 20 meq via ORAL
  Filled 2023-08-05: qty 1

## 2023-08-05 MED ORDER — FUROSEMIDE 10 MG/ML IJ SOLN
60.0000 mg | Freq: Once | INTRAMUSCULAR | Status: AC
  Administered 2023-08-05: 60 mg via INTRAVENOUS
  Filled 2023-08-05: qty 6

## 2023-08-05 NOTE — Progress Notes (Signed)
Patient ID: [name], female   DOB: Jul 17, 1950, 73 y.o.   MRN: [id number]      Subjective: More BMs, tolerated soft diet, no abdominal pain  Objective: Vital signs in last 24 hours: Temp:  [97.7 F (36.5 C)-98.5 F (36.9 C)] 98.5 F (36.9 C) (08/05 0800) Pulse Rate:  [84-103] 100 (08/05 0800) Resp:  [16-20] 20 (08/05 0800) BP: (138-154)/(60-84) 154/65 (08/05 0800) SpO2:  [91 %-98 %] 91 % (08/05 0800) Weight:  [130.9 kg] 130.9 kg (08/05 0500) Last BM Date : 08/04/23  Intake/Output from previous day: 08/04 0701 - 08/05 0700 In: 723 [P.O.:720; I.V.:3] Out: 1750 [Urine:1750] Intake/Output this shift: No intake/output data recorded.  PE: Abd: soft, NT, obese  Lab Results: CBC  Recent Labs    08/03/23 0534 08/04/23 0306  WBC 11.8* 8.6  HGB 11.0* 10.7*  HCT 35.4* 34.1*  PLT 150 162   BMET Recent Labs    08/04/23 0306 08/05/23 0342  NA 141 140  K 4.1 3.8  CL 101 99  CO2 28 30  GLUCOSE 157* 142*  BUN 25* 24*  CREATININE 0.97 0.93  CALCIUM 9.0 9.1   PT/INR No results for input(s): "LABPROT", "INR" in the last 72 hours. ABG No results for input(s): "PHART", "HCO3" in the last 72 hours.  Invalid input(s): "PCO2", "PO2"  Studies/Results: DG Chest Port 1 View  Result Date: 08/04/2023 CLINICAL DATA:  141880 SOB (shortness of breath) 141880 EXAM: PORTABLE CHEST - 1 VIEW COMPARISON:  07/31/2023 FINDINGS: Some increase in coarse interstitial and airspace opacities involving bases more than apices. Heart size upper limits normal. Aortic Atherosclerosis (ICD10-170.0). Blunting of left lateral costophrenic angle suggesting small effusion. Visualized bones unremarkable. IMPRESSION: Increasing coarse interstitial and airspace opacities. Electronically Signed   By: Corlis Leak M.D.   On: 08/04/2023 09:58    Anti-infectives: Anti-infectives (From admission, onward)    Start     Dose/Rate Route Frequency Ordered Stop   08/01/23 1500  Ampicillin-Sulbactam (UNASYN) 3 g in  sodium chloride 0.9 % 100 mL IVPB        3 g 200 mL/hr over 30 Minutes Intravenous Every 8 hours 08/01/23 1420     07/31/23 2315  cefTRIAXone (ROCEPHIN) 1 g in sodium chloride 0.9 % 100 mL IVPB  Status:  Discontinued        1 g 200 mL/hr over 30 Minutes Intravenous  Once 07/31/23 2313 07/31/23 2313   07/31/23 2315  azithromycin (ZITHROMAX) 500 mg in sodium chloride 0.9 % 250 mL IVPB        500 mg 250 mL/hr over 60 Minutes Intravenous  Once 07/31/23 2313 08/01/23 0118   07/31/23 2315  cefTRIAXone (ROCEPHIN) 2 g in sodium chloride 0.9 % 100 mL IVPB        2 g 200 mL/hr over 30 Minutes Intravenous  Once 07/31/23 2313 08/01/23 0005       Assessment/Plan: SBO in setting of complex past surgical history with hostile abdomen -improving, tolerating soft -OK to D/C from surgical standpoint, relayed to primary service.  FEN - soft VTE - OK to resume Eliquis from our standpoint ID - unasyn for aspiration PNA  - per TRH -  Hx of endometrial CA A. Fib on eliquis CKD stage III HTN HLD T2DM GERD Gout    LOS: 4 days    Letha Cape, PA-C  [facility] Use [email or web address] to contact on call provider  08/05/2023

## 2023-08-05 NOTE — Plan of Care (Signed)

## 2023-08-05 NOTE — Plan of Care (Signed)

## 2023-08-05 NOTE — Care Management Important Message (Signed)
Important Message  Patient Details  Name: [name] MRN: [id number] Date of Birth: 1950/10/02   Medicare Important Message Given:  Yes     Dorena Bodo 08/05/2023, 3:36 PM

## 2023-08-05 NOTE — Progress Notes (Signed)
PROGRESS NOTE        PATIENT DETAILS Name: [name] Age: 73 y.o. Sex: female Date of Birth: 06-30-1950 Admit Date: 07/31/2023 Admitting Physician Synetta Fail, MD [id number], Maryjean Morn, MD  Brief Summary: Patient is a 73 y.o.  female with history of HTN, HLD, history of prior colectomy/colostomy in the late 1990s-s/p reversal, endometrial cancer-s/p TAH with BSO-presenting with abdominal pain/vomiting-found to have SBO.  While in the ED-during one of the episodes of vomiting-patient sustained a syncopal episode and subsequently aspirated vomitus.  Patient was then admitted to the hospitalist service for further evaluation and treatment.  Significant events: 7/31>> admit to [facility]  Significant studies: 7/31>> CT abdomen/pelvis: SBO with transition point right lower quadrant. 7/31>> CT angio chest: No PE, multifocal PNA 8/02>> x-ray abdomen SBO protocol: Contrast in sigmoid colon. 8/04>> CXR: Increasing coarse/interstitial/airspace opacities  Significant microbiology data: 7/31>> blood culture: No growth  Procedures: None  Consults: General surgery  Subjective: No major issue overnight-had BM-tolerating advancement in diet-breathing is much better.  I titrated down to room air this morning.  Objective: Vitals: Blood pressure (!) 154/65, pulse 100, temperature 98.5 F (36.9 C), temperature source Oral, resp. rate 20, height 5\' 4"  (1.626 m), weight 130.9 kg, SpO2 91%.   Exam: Gen Exam:Alert awake-not in any distress HEENT:atraumatic, normocephalic Chest: B/L clear to auscultation anteriorly CVS:S1S2 regular Abdomen:soft non tender, non distended Extremities:+ edema Neurology: Non focal Skin: no rash  Pertinent Labs/Radiology:    Latest Ref Rng & Units 08/04/2023    3:06 AM 08/03/2023    5:34 AM 08/02/2023   12:27 AM  CBC  WBC 4.0 - 10.5 K/uL 8.6  11.8  10.4   Hemoglobin 12.0 - 15.0 g/dL 01.0  27.2  53.6   Hematocrit 36.0 - 46.0 % 34.1   35.4  36.5   Platelets 150 - 400 K/uL 162  150  150     Lab Results  Component Value Date   NA 140 08/05/2023   K 3.8 08/05/2023   CL 99 08/05/2023   CO2 30 08/05/2023     Assessment/Plan: SBO Likely secondary to adhesions/scar tissue Resolved with supportive care-NG tube discontinued several days back. Tolerating advancement in diet.  Acute hypoxic respiratory failure secondary to aspiration pneumonia and decompensated HFpEF Much improved-on room air this morning IV Lasix x 1 today Continue Unasyn Mobilize  Syncope This occurred with vomiting-likely vasovagal-furthermore-patient has history of syncope in the past associated with vomiting episodes. Continue telemetry monitoring Recent echo September 2023 with stable EF.  AKI Hemodynamically mediated Resolved with supportive care. Avoid nephrotoxic agents  PAF Continue Coreg Eliquis  Acute on chronic HFpEF Seizure buff  Volume status much improved-improving hypoxia-repeat IV Lasix today Continue beta-blocker/resume losartan  HTN BP better controlled Coreg/amlodipine/losartan.    HLD Statin.  DM-2 Follow CBGs Continue insulin 70/30 20 units twice daily (reduced dose)-and follow CBG trend.  Recent Labs    08/04/23 1959 08/04/23 2356 08/05/23 0818  GLUCAP 210* 135* 173*    3.6 cm left ovarian simple appearing cyst Radiology recommending follow-up ultrasound in 6 to 12 months  History of endometrial cancer Follow-up with GYN oncology  Obesity: Estimated body mass index is 49.53 kg/m as calculated from the following:   Height as of this encounter: 5\' 4"  (1.626 m).   Weight as of this encounter: 130.9 kg.   Code status:  Code Status: Full Code   DVT Prophylaxis: SCDs Start: 08/01/23 1401 apixaban (ELIQUIS) tablet 5 mg    Family Communication: Spouse-Robert-[phone] -updated over the phone 8/3, spoke with daughter on 8/5.   Disposition Plan: Status is: Inpatient Remains inpatient  appropriate because: Severity of illness   Planned Discharge Destination:Home health likely on 8/6 or 8/7 depending on clinical improvement.   Diet: Diet Order             DIET SOFT Room service appropriate? Yes; Fluid consistency: Thin  Diet effective now                     Antimicrobial agents: Anti-infectives (From admission, onward)    Start     Dose/Rate Route Frequency Ordered Stop   08/01/23 1500  Ampicillin-Sulbactam (UNASYN) 3 g in sodium chloride 0.9 % 100 mL IVPB        3 g 200 mL/hr over 30 Minutes Intravenous Every 8 hours 08/01/23 1420     07/31/23 2315  cefTRIAXone (ROCEPHIN) 1 g in sodium chloride 0.9 % 100 mL IVPB  Status:  Discontinued        1 g 200 mL/hr over 30 Minutes Intravenous  Once 07/31/23 2313 07/31/23 2313   07/31/23 2315  azithromycin (ZITHROMAX) 500 mg in sodium chloride 0.9 % 250 mL IVPB        500 mg 250 mL/hr over 60 Minutes Intravenous  Once 07/31/23 2313 08/01/23 0118   07/31/23 2315  cefTRIAXone (ROCEPHIN) 2 g in sodium chloride 0.9 % 100 mL IVPB        2 g 200 mL/hr over 30 Minutes Intravenous  Once 07/31/23 2313 08/01/23 0005        MEDICATIONS: Scheduled Meds:  amLODipine  5 mg Oral Daily   apixaban  5 mg Oral BID   atorvastatin  80 mg Oral QHS   carvedilol  12.5 mg Oral BID WC   [START ON 08/06/2023] furosemide  80 mg Oral Daily   guaiFENesin  600 mg Oral BID   insulin aspart  0-15 Units Subcutaneous TID WC   insulin aspart  0-5 Units Subcutaneous QHS   insulin aspart protamine- aspart  20 Units Subcutaneous BID WC   losartan  50 mg Oral Daily   sodium chloride flush  3 mL Intravenous Q12H   Continuous Infusions:  ampicillin-sulbactam (UNASYN) IV 3 g (08/05/23 0531)   PRN Meds:.acetaminophen **OR** acetaminophen   I have personally reviewed following labs and imaging studies  LABORATORY DATA: CBC: Recent Labs  Lab 07/31/23 2024 08/01/23 1415 08/02/23 0027 08/03/23 0534 08/04/23 0306  WBC 9.5 10.7* 10.4  11.8* 8.6  NEUTROABS 8.5*  --   --   --   --   HGB 13.3 11.4* 11.5* 11.0* 10.7*  HCT 40.3 36.1 36.5 35.4* 34.1*  MCV 101.0* 105.9* 106.1* 106.9* 103.0*  PLT 242 157 150 150 162    Basic Metabolic Panel: Recent Labs  Lab 08/01/23 1415 08/02/23 0027 08/03/23 0534 08/04/23 0306 08/05/23 0342  NA 142 144 142 141 140  K 4.1 3.8 3.7 4.1 3.8  CL 101 102 102 101 99  CO2 29 27 28 28 30   GLUCOSE 195* 177* 183* 157* 142*  BUN 38* 39* 30* 25* 24*  CREATININE 1.53* 1.42* 0.99 0.97 0.93  CALCIUM 8.5* 8.7* 8.9 9.0 9.1    GFR: Estimated Creatinine Clearance: 71.4 mL/min (by C-G formula based on SCr of 0.93 mg/dL).  Liver Function Tests: Recent Labs  Lab 07/31/23  2024 08/01/23 1415 08/02/23 0027 08/03/23 0534  AST 40 17 21 28   ALT 13 15 16 20   ALKPHOS 85 86 88 117  BILITOT 1.1 1.1 0.8 0.9  PROT 7.0 6.1* 6.3* 6.3*  ALBUMIN 4.0 3.1* 3.1* 2.9*   No results for input(s): "LIPASE", "AMYLASE" in the last 168 hours. No results for input(s): "AMMONIA" in the last 168 hours.  Coagulation Profile: Recent Labs  Lab 07/31/23 2024  INR 1.2    Cardiac Enzymes: No results for input(s): "CKTOTAL", "CKMB", "CKMBINDEX", "TROPONINI" in the last 168 hours.  BNP (last 3 results) Recent Labs    10/16/22 1316 11/01/22 1126 12/19/22 1354  PROBNP 1,363* 1,748* 1,229*    Lipid Profile: No results for input(s): "CHOL", "HDL", "LDLCALC", "TRIG", "CHOLHDL", "LDLDIRECT" in the last 72 hours.  Thyroid Function Tests: No results for input(s): "TSH", "T4TOTAL", "FREET4", "T3FREE", "THYROIDAB" in the last 72 hours.  Anemia Panel: No results for input(s): "VITAMINB12", "FOLATE", "FERRITIN", "TIBC", "IRON", "RETICCTPCT" in the last 72 hours.  Urine analysis:    Component Value Date/Time   COLORURINE YELLOW 08/17/2019 1215   APPEARANCEUR CLEAR 08/17/2019 1215   LABSPEC 1.012 08/17/2019 1215   PHURINE 5.0 08/17/2019 1215   GLUCOSEU NEGATIVE 08/17/2019 1215   HGBUR NEGATIVE 08/17/2019 1215    BILIRUBINUR NEGATIVE 08/17/2019 1215   KETONESUR NEGATIVE 08/17/2019 1215   PROTEINUR NEGATIVE 08/17/2019 1215   NITRITE NEGATIVE 08/17/2019 1215   LEUKOCYTESUR SMALL (A) 08/17/2019 1215    Sepsis Labs: Lactic Acid, Venous    Component Value Date/Time   LATICACIDVEN 2.3 (HH) 08/01/2023 1133    MICROBIOLOGY: Recent Results (from the past 240 hour(s))  Culture, blood (Routine x 2)     Status: None   Collection Time: 07/31/23  8:24 PM   Specimen: BLOOD  Result Value Ref Range Status   Specimen Description   Final    BLOOD LEFT ANTECUBITAL Performed at [facility], [address] [place], [place] [address]    Special Requests   Final    BOTTLES DRAWN AEROBIC AND ANAEROBIC Blood Culture adequate volume Performed at [facility], [address], [place], [place] [address]    Culture   Final    NO GROWTH 5 DAYS Performed at [facility], [address], [place], [place] [address]    Report Status 08/05/2023 FINAL  Final  Culture, blood (Routine x 2)     Status: None   Collection Time: 07/31/23  8:53 PM   Specimen: BLOOD  Result Value Ref Range Status   Specimen Description   Final    BLOOD BLOOD LEFT ARM Performed at [facility], [address] [place], [place] [address]    Special Requests   Final    BOTTLES DRAWN AEROBIC AND ANAEROBIC Blood Culture results may not be optimal due to an excessive volume of blood received in culture bottles Performed at [facility], [address] [place], [place] [address]    Culture   Final    NO GROWTH 5 DAYS Performed at [facility], [address], [place], [place] [address]    Report Status 08/05/2023 FINAL  Final    RADIOLOGY STUDIES/RESULTS: DG Chest Port 1 View  Result Date: 08/04/2023 CLINICAL DATA:  141880 SOB (shortness of breath) 141880 EXAM: PORTABLE CHEST - 1 VIEW COMPARISON:  07/31/2023 FINDINGS: Some increase in coarse  interstitial and airspace opacities involving bases more than apices. Heart size upper limits normal. Aortic Atherosclerosis (ICD10-170.0). Blunting of  left lateral costophrenic angle suggesting small effusion. Visualized bones unremarkable. IMPRESSION: Increasing coarse interstitial and airspace opacities. Electronically Signed   By: Corlis Leak M.D.   On: 08/04/2023 09:58     LOS: 4 days   Jeoffrey Massed, MD  [facility]    To contact the attending provider between 7A-7P or the covering provider during after hours 7P-7A, please log into the web site [email or web address] and access using universal [place] password for that web site. If you do not have the password, please call the hospital operator.  08/05/2023, 11:37 AM

## 2023-08-05 NOTE — Progress Notes (Signed)
Physical Therapy Treatment Patient Details Name: [name] MRN: [id number] DOB: 04/16/1950 Today's Date: 08/05/2023   History of Present Illness Pt is 73 yo female presenting to [facility] ER with abdominal pain/vomiting found to have SBO. Pt sustained a syncopal episode and subsequently aspirated vomit while in ED. PMH: HTN, HLD, HX prior colectomy/colostomy in late 1990's s/p reversal, endometrial cancer s/p TAH with BSO. Pt also reports recent Left carpal tunnel surgery.    PT Comments  Patient eager to get up and get moving as she wants to go home soon. Patient up to EOB with use of rail and HOB elevated, but no outside assist. Transfers with minguard assist with RW (guarding due to incontinence of urine while stepping around to [facility]). Patient's mobility is nearly at baseline, however the lack of absorbent undergarment and her urinary incontinence make it unsafe for her to get up to [facility] unassisted. RN aware and will try to get to pt in time for her to use BSC instead of purewick.     If plan is discharge home, recommend the following: Help with stairs or ramp for entrance;Assistance with cooking/housework   Can travel by private vehicle        Equipment Recommendations  None recommended by PT    Recommendations for Other Services       Precautions / Restrictions Precautions Precautions: Fall Restrictions Weight Bearing Restrictions: No     Mobility  Bed Mobility Overal bed mobility: Needs Assistance Bed Mobility: Rolling, Sidelying to Sit Rolling: Modified independent (Device/Increase time) Sidelying to sit: Supervision, HOB elevated (with rail)       General bed mobility comments: incr time and effort, but no physical assist    Transfers Overall transfer level: Needs assistance Equipment used: Rolling walker (2 wheels) Transfers: Sit to/from Stand, Bed to chair/wheelchair/BSC Sit to Stand: Min guard   Step pivot transfers: Min guard       General transfer comment: no  physical assist; guarding for safety as pt incontinent of urine as soon as she stood to go to [facility]    Ambulation/Gait Ambulation/Gait assistance: Min guard Gait Distance (Feet): 5 Feet Assistive device: Rolling walker (2 wheels) Gait Pattern/deviations: Step-to pattern, Trunk flexed, Shuffle Gait velocity: slowed     General Gait Details: pt flexed over RW. Pt fatigues quickly as she reports using WC at home and only ambulates short distances.  Cues for posture   Stairs             Wheelchair Mobility     Tilt Bed    Modified Rankin (Stroke Patients Only)       Balance Overall balance assessment: Needs assistance Sitting-balance support: No upper extremity supported, Feet supported Sitting balance-Leahy Scale: Good     Standing balance support: Bilateral upper extremity supported, During functional activity, Reliant on assistive device for balance Standing balance-Leahy Scale: Poor Standing balance comment: pt has poor standing balance                            Cognition Arousal/Alertness: Awake/alert Behavior During Therapy: WFL for tasks assessed/performed Overall Cognitive Status: Within Functional Limits for tasks assessed                                 General Comments: Pt AAOx4 and pleasant throughout session.        Exercises      General Comments  General comments (skin integrity, edema, etc.): Onto BSC, but incontinent of urine on the way during stand-pivot. Assisted pt with pericare (she could not do it herself bc cannot spread her legs while sitting on BSC and cannot reach her bottom when standing). Assisted with wiping legs down and donning new socks. Discussed transferring to [facility] for elimination, however pt reports nursing typically cannot get to her in time. She wants to get to [facility] alone, however without a pad, she urinates while transferring and then floor is wet and unsafe for her to be up alone. Purewick replaced and  encouraged pt to call nursing when she needs to go so they can assist her to [facility].      Pertinent Vitals/Pain Pain Assessment Pain Assessment: Faces Faces Pain Scale: Hurts even more Pain Location: back Pain Descriptors / Indicators: Aching Pain Intervention(s): Limited activity within patient's tolerance, Monitored during session, Repositioned    Home Living                          Prior Function            PT Goals (current goals can now be found in the care plan section) Acute Rehab PT Goals Patient Stated Goal: to return home with family Time For Goal Achievement: 08/16/23 Potential to Achieve Goals: Good Progress towards PT goals: Progressing toward goals    Frequency    Min 1X/week      PT Plan Current plan remains appropriate    Co-evaluation              AM-PAC PT "6 Clicks" Mobility   Outcome Measure  Help needed turning from your back to your side while in a flat bed without using bedrails?: None Help needed moving from lying on your back to sitting on the side of a flat bed without using bedrails?: A Little Help needed moving to and from a bed to a chair (including a wheelchair)?: A Little Help needed standing up from a chair using your arms (e.g., wheelchair or bedside chair)?: A Little Help needed to walk in hospital room?: A Lot (<20 ft, but her baseline) Help needed climbing 3-5 steps with a railing? : A Lot 6 Click Score: 17    End of Session   Activity Tolerance: Patient limited by fatigue Patient left: in chair;with call bell/phone within reach;with chair alarm set Nurse Communication: Mobility status;Other (comment) (discussed use of BSC if pt able to hold it until nursing arrives to assist) PT Visit Diagnosis: Unsteadiness on feet (R26.81);Other abnormalities of gait and mobility (R26.89)     Time: 2355-7322 PT Time Calculation (min) (ACUTE ONLY): 32 min  Charges:    $Gait Training: 23-37 mins PT General Charges $$  ACUTE PT VISIT: 1 Visit                      Jerolyn Center, PT Acute Rehabilitation Services  Office [phone]    Zena Amos 08/05/2023, 11:26 AM

## 2023-08-06 ENCOUNTER — Other Ambulatory Visit (HOSPITAL_COMMUNITY): Payer: Self-pay

## 2023-08-06 DIAGNOSIS — T17908A Unspecified foreign body in respiratory tract, part unspecified causing other injury, initial encounter: Secondary | ICD-10-CM | POA: Diagnosis not present

## 2023-08-06 DIAGNOSIS — J9601 Acute respiratory failure with hypoxia: Secondary | ICD-10-CM | POA: Diagnosis not present

## 2023-08-06 DIAGNOSIS — K56609 Unspecified intestinal obstruction, unspecified as to partial versus complete obstruction: Secondary | ICD-10-CM | POA: Diagnosis not present

## 2023-08-06 DIAGNOSIS — I4891 Unspecified atrial fibrillation: Secondary | ICD-10-CM | POA: Diagnosis not present

## 2023-08-06 LAB — GLUCOSE, CAPILLARY
Glucose-Capillary: 183 mg/dL — ABNORMAL HIGH (ref 70–99)
Glucose-Capillary: 194 mg/dL — ABNORMAL HIGH (ref 70–99)

## 2023-08-06 MED ORDER — POTASSIUM CHLORIDE CRYS ER 20 MEQ PO TBCR
40.0000 meq | EXTENDED_RELEASE_TABLET | Freq: Once | ORAL | Status: AC
  Administered 2023-08-06: 40 meq via ORAL
  Filled 2023-08-06: qty 2

## 2023-08-06 MED ORDER — POTASSIUM CHLORIDE CRYS ER 20 MEQ PO TBCR
40.0000 meq | EXTENDED_RELEASE_TABLET | Freq: Once | ORAL | Status: DC
  Filled 2023-08-06: qty 2

## 2023-08-06 MED ORDER — AMOXICILLIN-POT CLAVULANATE 875-125 MG PO TABS
1.0000 | ORAL_TABLET | Freq: Two times a day (BID) | ORAL | 0 refills | Status: DC
  Filled 2023-08-06: qty 4, 2d supply, fill #0

## 2023-08-06 MED ORDER — AMOXICILLIN-POT CLAVULANATE 875-125 MG PO TABS: 1.0000 | ORAL_TABLET | Freq: Two times a day (BID) | ORAL | 0 refills | Status: AC

## 2023-08-06 NOTE — Plan of Care (Signed)

## 2023-08-06 NOTE — Progress Notes (Signed)
Occupational Therapy Treatment Patient Details Name: [name] MRN: [id number] DOB: 1950-04-27 Today's Date: 08/06/2023   History of present illness Pt is 73 yo female presenting to [facility] ER with abdominal pain/vomiting found to have SBO. Pt sustained a syncopal episode and subsequently aspirated vomit while in ED. PMH: HTN, HLD, HX prior colectomy/colostomy in late 1990's s/p reversal, endometrial cancer s/p TAH with BSO. Pt also reports recent Left carpal tunnel surgery.   OT comments  Pt in good spirits, eager to return home. Arrived with Pt in recliner dressed and ready to go home. Pt able to list 2-3 energy conservation strategies to improve functional independence at home, educated further on safety awareness and compensatory strategies to complete ADLs/transfers. Pt states B knee pain limits mobility, and mentioned her weight plays a role with decreased mobility, educated on healthy eating strategies and daily routines to improve healthy eating habits/choices. Pt states she has no other concerns and has all DME needed to remain safe in home, HHOT still appropriate to maximize functional independence, strength, and activity tolerance at home.    Recommendations for follow up therapy are one component of a multi-disciplinary discharge planning process, led by the attending physician.  Recommendations may be updated based on patient status, additional functional criteria and insurance authorization.    Assistance Recommended at Discharge Intermittent Supervision/Assistance  Patient can return home with the following  A little help with walking and/or transfers;A lot of help with bathing/dressing/bathroom;Assistance with cooking/housework;Assist for transportation;Help with stairs or ramp for entrance   Equipment Recommendations  None recommended by OT    Recommendations for Other Services      Precautions / Restrictions Precautions Precautions: Fall Restrictions Weight Bearing  Restrictions: No       Mobility Bed Mobility                    Transfers Overall transfer level: Needs assistance Equipment used: Rolling walker (2 wheels) Transfers: Sit to/from Stand, Bed to chair/wheelchair/BSC Sit to Stand: Min guard     Step pivot transfers: Min guard     General transfer comment: able to perform STS and assist to recliner min guard     Balance Overall balance assessment: Needs assistance                                         ADL either performed or assessed with clinical judgement   ADL Overall ADL's : Needs assistance/impaired                                            Extremity/Trunk Assessment              Vision       Perception     Praxis      Cognition Arousal/Alertness: Awake/alert Behavior During Therapy: WFL for tasks assessed/performed Overall Cognitive Status: Within Functional Limits for tasks assessed                                          Exercises      Shoulder Instructions       General Comments      Pertinent Vitals/ Pain  Pain Assessment Pain Assessment: No/denies pain  Home Living                                          Prior Functioning/Environment              Frequency  Min 1X/week        Progress Toward Goals  OT Goals(current goals can now be found in the care plan section)     Acute Rehab OT Goals Patient Stated Goal: to return home OT Goal Formulation: With patient Potential to Achieve Goals: Good ADL Goals Pt Will Perform Lower Body Bathing: with min guard assist;with adaptive equipment;sit to/from stand;sitting/lateral leans Pt Will Perform Lower Body Dressing: with min guard assist;with adaptive equipment;sit to/from stand;sitting/lateral leans Pt Will Transfer to Toilet: with modified independence;bedside commode;ambulating Pt Will Perform Toileting - Clothing Manipulation and  hygiene: with min guard assist;with adaptive equipment;sit to/from stand;sitting/lateral leans Pt/caregiver will Perform Home Exercise Program: Increased strength;Both right and left upper extremity;With theraband;With theraputty;Independently;With written HEP provided Additional ADL Goal #1: Patient will demonstrate ability to Independently state 4 energy conservation strategies to increase safety and independence with functional activities in the home.  Plan Discharge plan remains appropriate;Frequency remains appropriate    Co-evaluation                 AM-PAC OT "6 Clicks" Daily Activity     Outcome Measure   Help from another person eating meals?: None Help from another person taking care of personal grooming?: None Help from another person toileting, which includes using toliet, bedpan, or urinal?: A Lot Help from another person bathing (including washing, rinsing, drying)?: A Lot Help from another person to put on and taking off regular upper body clothing?: A Little Help from another person to put on and taking off regular lower body clothing?: A Lot 6 Click Score: 17    End of Session    OT Visit Diagnosis: Muscle weakness (generalized) (M62.81);Other (comment)   Activity Tolerance Patient tolerated treatment well   Patient Left in chair;with call bell/phone within reach;with chair alarm set   Nurse Communication Mobility status        Time: 3016-0109 OT Time Calculation (min): 16 min  Charges: OT General Charges $OT Visit: 1 Visit OT Treatments $Therapeutic Activity: 8-22 mins  [place], OTR/L   [name] 08/06/2023, 3:41 PM

## 2023-08-06 NOTE — Progress Notes (Signed)
Patient feels better. Pt is a mouth breather while sleeping. Needs to be placed on O2 at 2lpm while sleeping and during ambulation otherwise no issue with her saturation while awake.

## 2023-08-06 NOTE — Progress Notes (Signed)
SATURATION QUALIFICATIONS: (This note is used to comply with regulatory documentation for home oxygen)  Patient Saturations on Room Air at Rest = 96%  Patient Saturations on Room Air while Ambulating = 85%  Patient Saturations on 2 Liters of oxygen while Ambulating = 90%  Please briefly explain why patient needs home oxygen: Dyspena while ambulating w/desaturation on RA. Walked 81ft on RA required 2 stops to rest with an increase in HR to 140s

## 2023-08-06 NOTE — Discharge Summary (Addendum)
PATIENT DETAILS Name: [name] Age: 73 y.o. Sex: female Date of Birth: 1950-02-23 MRN: [id number]. Admitting Physician: Synetta Fail, MD WUJ:[id number], Maryjean Morn, MD  Admit Date: 07/31/2023 Discharge date: 08/06/2023  Recommendations for Outpatient Follow-up:  Follow up with PCP in 1-2 weeks Please obtain CMP/CBC in one week Incidental finding-left ovarian cyst-radiology recommending repeat ultrasound in 6 to 12 months.  Admitted From:  Home  Disposition: Home health   Discharge Condition: good  CODE STATUS:   Code Status: Full Code   Diet recommendation:  Diet Order             Diet - low sodium heart healthy           Diet Carb Modified           DIET SOFT Room service appropriate? Yes; Fluid consistency: Thin  Diet effective now                    Brief Summary: Patient is a 73 y.o.  female with history of HTN, HLD, history of prior colectomy/colostomy in the late 1990s-s/p reversal, endometrial cancer-s/p TAH with BSO-presenting with abdominal pain/vomiting-found to have SBO.  While in the ED-during one of the episodes of vomiting-patient sustained a syncopal episode and subsequently aspirated vomitus.  Patient was then admitted to the hospitalist service for further evaluation and treatment.   Significant events: 7/31>> admit to [facility]   Significant studies: 7/31>> CT abdomen/pelvis: SBO with transition point right lower quadrant. 7/31>> CT angio chest: No PE, multifocal PNA 8/02>> x-ray abdomen SBO protocol: Contrast in sigmoid colon. 8/04>> CXR: Increasing coarse/interstitial/airspace opacities   Significant microbiology data: 7/31>> blood culture: No growth   Procedures: None   Consults: General surgery  Brief Hospital Course: SBO Likely secondary to adhesions/scar tissue Resolved with supportive care-NG tube discontinued several days back. Tolerating advancement in diet.   Acute hypoxic respiratory failure secondary to aspiration  pneumonia and decompensated HFpEF Required up to 6 L of oxygen Treated with Unasyn/Lasix Titrated to room air at rest May require oxygen with ambulation-RN to perform ambulatory O2 sat-if patient qualifies-then will order home O2.  If she does require O2-it will probably be for a short period of time. Transition to Augmentin for 2 more days to complete a 7-day course. PCP to reorder two-view chest x-ray to document resolution of pneumonia.    Syncope This occurred with vomiting-likely vasovagal-furthermore-patient has history of syncope in the past associated with vomiting episodes. Continue telemetry monitoring Recent echo September 2023 with stable EF.   AKI Hemodynamically mediated Resolved with supportive care. Avoid nephrotoxic agents  Hypokalemia Due to furosemide Replete/recheck.   PAF Continue Coreg Eliquis   Acute on chronic HFpEF Much better-now compensated-required several dose of IV Lasix Good transition back to usual home regimen of Lasix starting today. Continue beta-blocker/resume losartan   HTN BP better controlled Coreg/amlodipine/losartan.     HLD Statin.   DM-2 Follow CBGs Resume home regimen of insulin 70/30.    3.6 cm left ovarian simple appearing cyst Radiology recommending follow-up ultrasound in 6 to 12 months   History of endometrial cancer Follow-up with GYN oncology  Debility/deconditioning At baseline minimally ambulatory-uses wheelchair-but able to walk a few steps Worsening debility due to acute illness-PT/OT eval-Home health recommended.   Morbid Obesity: Estimated body mass index is 49.53 kg/m as calculated from the following:   Height as of this encounter: 5\' 4"  (1.626 m).   Weight as of this encounter: 130.9 kg.  Discharge Diagnoses:  Principal Problem:   SBO (small bowel obstruction) (HCC) Active Problems:   Diabetes mellitus with complication (HCC)   HLD (hyperlipidemia)   Essential hypertension   Endometrial  cancer (HCC)   Pelvic adhesive disease   Morbid (severe) obesity due to excess calories (HCC)   A-fib (HCC)   Aspiration into airway   Acute respiratory failure with hypoxia [facility])   Discharge Instructions:  Activity:  As tolerated with Full fall precautions use walker/cane & assistance as needed   Discharge Instructions     (HEART FAILURE PATIENTS) Call MD:  Anytime you have any of the following symptoms: 1) 3 pound weight gain in 24 hours or 5 pounds in 1 week 2) shortness of breath, with or without a dry hacking cough 3) swelling in the hands, feet or stomach 4) if you have to sleep on extra pillows at night in order to breathe.   Complete by: As directed    Call MD for:  difficulty breathing, headache or visual disturbances   Complete by: As directed    Diet - low sodium heart healthy   Complete by: As directed    Diet Carb Modified   Complete by: As directed    Discharge instructions   Complete by: As directed    Follow with Primary MD  McGowen, Maryjean Morn, MD in 1-2 weeks  Repeat two-view chest x-ray in 6 to 8 weeks to document resolution of pneumonia  Repeat pelvic ultrasound to assess for left ovarian cyst and 6 to 11 months.  Please get a complete blood count and chemistry panel checked by your Primary MD at your next visit, and again as instructed by your Primary MD.  Get Medicines reviewed and adjusted: Please take all your medications with you for your next visit with your Primary MD  Laboratory/radiological data: Please request your Primary MD to go over all hospital tests and procedure/radiological results at the follow up, please ask your Primary MD to get all Hospital records sent to his/her office.  In some cases, they will be blood work, cultures and biopsy results pending at the time of your discharge. Please request that your primary care M.D. follows up on these results.  Also Note the following: If you experience worsening of your admission symptoms,  develop shortness of breath, life threatening emergency, suicidal or homicidal thoughts you must seek medical attention immediately by calling 911 or calling your MD immediately  if symptoms less severe.  You must read complete instructions/literature along with all the possible adverse reactions/side effects for all the Medicines you take and that have been prescribed to you. Take any new Medicines after you have completely understood and accpet all the possible adverse reactions/side effects.   Do not drive when taking Pain medications or sleeping medications (Benzodaizepines)  Do not take more than prescribed Pain, Sleep and Anxiety Medications. It is not advisable to combine anxiety,sleep and pain medications without talking with your primary care practitioner  Special Instructions: If you have smoked or chewed Tobacco  in the last 2 yrs please stop smoking, stop any regular Alcohol  and or any Recreational drug use.  Wear Seat belts while driving.  Please note: You were cared for by a hospitalist during your hospital stay. Once you are discharged, your primary care physician will handle any further medical issues. Please note that NO REFILLS for any discharge medications will be authorized once you are discharged, as it is imperative that you return to your primary care  physician (or establish a relationship with a primary care physician if you do not have one) for your post hospital discharge needs so that they can reassess your need for medications and monitor your lab values.   Increase activity slowly   Complete by: As directed       Allergies as of 08/06/2023       Reactions   Lisinopril Cough   Other reaction(s): cough, Not available   Actos [pioglitazone]    Unknown reaction   Metformin Other (See Comments)   Metformin And Related    Gas         Medication List     TAKE these medications    acetaminophen 500 MG tablet Commonly known as: TYLENOL Take 1,000 mg by mouth  every 6 (six) hours as needed for moderate pain.   allopurinol 300 MG tablet Commonly known as: ZYLOPRIM Take 1 tablet (300 mg total) by mouth daily.   amLODipine 5 MG tablet Commonly known as: NORVASC Take 1 tablet (5 mg total) by mouth daily. What changed: when to take this   amoxicillin-clavulanate 875-125 MG tablet Commonly known as: AUGMENTIN Take 1 tablet by mouth 2 (two) times daily for 2 days.   apixaban 5 MG Tabs tablet Commonly known as: ELIQUIS Take 1 tablet (5 mg total) by mouth 2 (two) times daily.   atorvastatin 80 MG tablet Commonly known as: LIPITOR Take 1 tablet (80 mg total) by mouth at bedtime.   carvedilol 12.5 MG tablet Commonly known as: COREG Take 1 tablet (12.5 mg total) by mouth 2 (two) times daily with a meal.   CoQ10 100 MG Caps Take 100 mg by mouth daily.   famotidine 20 MG tablet Commonly known as: PEPCID Take 20 mg by mouth daily.   furosemide 40 MG tablet Commonly known as: LASIX Take 2 tablets (80 mg) in the morning. What changed:  how much to take how to take this when to take this   Gemtesa 75 MG Tabs Generic drug: Vibegron Take 1 tablet (75 mg total) by mouth daily.   GLUCOSAMINE-CHONDROITIN PO Take 1 tablet by mouth 2 (two) times daily.   insulin NPH-regular Human (70-30) 100 UNIT/ML injection Inject 50-70 Units into the skin See admin instructions. 70 units In the morning and 50 units in the evening   KRILL OIL PO Take 1 capsule by mouth daily.   losartan 50 MG tablet Commonly known as: COZAAR Take 1 tablet (50 mg total) by mouth daily.   meclizine 12.5 MG tablet Commonly known as: ANTIVERT Take 1 tablet (12.5 mg total) by mouth 3 (three) times daily as needed for dizziness.   MELATONIN PO Take 10 mg by mouth at bedtime.   multivitamin with minerals tablet Take 1 tablet by mouth daily.   potassium chloride 10 MEQ tablet Commonly known as: KLOR-CON Take 1 tablet (10 mEq total) by mouth 2 (two) times  daily. What changed: when to take this   VITAMIN D3 PO Take 1 capsule by mouth daily.               Durable Medical Equipment  (From admission, onward)           Start     Ordered   08/06/23 1013  DME Oxygen  Once       Question Answer Comment  Length of Need 6 Months   Mode or (Route) Nasal cannula   Liters per Minute 2   Oxygen delivery system Gas  08/06/23 1013            Follow-up Information     McGowen, Maryjean Morn, MD. Schedule an appointment as soon as possible for a visit in 1 week(s).   Specialty: Family Medicine Contact information: [address] [place] [address] [phone]                Allergies  Allergen Reactions   Lisinopril Cough    Other reaction(s): cough, Not available   Actos [Pioglitazone]     Unknown reaction   Metformin Other (See Comments)   Metformin And Related     Gas      Other Procedures/Studies: DG Chest Port 1 View  Result Date: 08/04/2023 CLINICAL DATA:  141880 SOB (shortness of breath) [id number] EXAM: PORTABLE CHEST - 1 VIEW COMPARISON:  07/31/2023 FINDINGS: Some increase in coarse interstitial and airspace opacities involving bases more than apices. Heart size upper limits normal. Aortic Atherosclerosis (ICD10-170.0). Blunting of left lateral costophrenic angle suggesting small effusion. Visualized bones unremarkable. IMPRESSION: Increasing coarse interstitial and airspace opacities. Electronically Signed   By: Corlis Leak M.D.   On: 08/04/2023 09:58   DG Abd Portable 1V-Small Bowel Obstruction Protocol-initial, 8 hr delay  Result Date: 08/02/2023 CLINICAL DATA:  Small bowel obstruction. EXAM: PORTABLE ABDOMEN - 1 VIEW COMPARISON:  CT from 07/31/2023 and abdomen radiograph from 08/01/2023. FINDINGS: Significantly diminished exam detail due to patient body habitus. There is a enteric tube with tip and side port in the stomach. Enteric contrast material is identified within a loop of bowel in the left  lower quadrant of the abdomen, which appears to conform to the location of the sigmoid colon. Small amount of enteric contrast material is also noted within the right upper quadrant of the abdomen location of which is difficult to confirm but may localized to the hepatic flexure. IMPRESSION: 1. Significantly diminished exam detail due to patient body habitus. 2. Enteric contrast material is identified within a loop of bowel in the left lower quadrant of the abdomen, which appears to conform to the location of the sigmoid colon. Small amount of enteric contrast material is also noted within the right upper quadrant of the abdomen location of which is difficult to confirm but may localized to the hepatic flexure. Due to the limitations described above a repeat CT of the abdomen pelvis may be helpful to confirm resolving small-bowel obstruction. Electronically Signed   By: Signa Kell M.D.   On: 08/02/2023 05:47   DG Abd Portable 1 View  Result Date: 08/01/2023 CLINICAL DATA:  NG tube placement. EXAM: PORTABLE ABDOMEN - 1 VIEW COMPARISON:  06/08/2016. FINDINGS: The bowel gas pattern is normal field-of-view limited to the upper abdomen. An enteric tube terminates in the stomach. The heart is enlarged and atherosclerotic calcification of the aorta is noted. IMPRESSION: An enteric tube terminates in the stomach. Electronically Signed   By: Thornell Sartorius M.D.   On: 08/01/2023 00:54   CT ABDOMEN PELVIS W CONTRAST  Result Date: 07/31/2023 CLINICAL DATA:  Nausea and vomiting.  History of adhesions. EXAM: CT ABDOMEN AND PELVIS WITH CONTRAST TECHNIQUE: Multidetector CT imaging of the abdomen and pelvis was performed using the standard protocol following bolus administration of intravenous contrast. RADIATION DOSE REDUCTION: This exam was performed according to the departmental dose-optimization program which includes automated exposure control, adjustment of the mA and/or kV according to patient size and/or use of  iterative reconstruction technique. CONTRAST:  75mL OMNIPAQUE IOHEXOL 350 MG/ML SOLN  COMPARISON:  CT abdomen and pelvis 06/07/2016 FINDINGS: Lower chest: Multifocal airspace disease is present in the lower lungs bilaterally. Hepatobiliary: Single large gallstone present measuring 4.2 cm. No pericholecystic inflammation or biliary ductal dilatation. The liver is within normal limits. Pancreas: Unremarkable. No pancreatic ductal dilatation or surrounding inflammatory changes. Spleen: Normal in size without focal abnormality. Adrenals/Urinary Tract: There is diffuse bladder wall thickening with surrounding inflammation. Stomach/Bowel: Sigmoid colon anastomosis is present. The colon is nondilated. The appendix is not seen. There are dilated mid and proximal small bowel loops with air-fluid levels measuring up to 5 cm. The stomach is moderately distended with air-fluid level. Transition point is seen at a level of the anastomosis in the right lower quadrant coronal image 5/81. Distal small bowel is decompressed. There is mild mesenteric edema involving dilated small bowel loops. No pneumatosis or free air. Vascular/Lymphatic: Aortic atherosclerosis. No enlarged abdominal or pelvic lymph nodes. Reproductive: There is a 3.6 cm cyst in the left ovary which has minimally increased in size compared to 2017. The uterus and right ovary are within normal limits. Other: There is a small amount of free fluid in the pelvis. Multiple small bowel loops approximate the anterior abdominal wall likely related to adhesive disease similar to the prior study. There is no focal abdominal wall hernia. Musculoskeletal: Multilevel degenerative changes affect the spine. IMPRESSION: 1. Findings compatible with small bowel obstruction with transition point in the right lower quadrant at the level of the anastomosis. There is mild mesenteric edema. No pneumatosis or free air. 2. Diffuse bladder wall thickening with surrounding inflammation  worrisome for cystitis. 3. Small amount of free fluid in the pelvis. 4. Cholelithiasis. 5. 3.6 cm left ovarian simple-appearing cyst. Recommend follow-up ultrasound in 6-12 months to confirm stability. 6. Aortic atherosclerosis. Aortic Atherosclerosis (ICD10-I70.0). These results were called by telephone at the time of interpretation on 07/31/2023 at 11:43 pm to provider Vonita Moss , who verbally acknowledged these results. Electronically Signed   By: Darliss Cheney M.D.   On: 07/31/2023 23:44   CT Angio Chest PE W and/or Wo Contrast  Result Date: 07/31/2023 CLINICAL DATA:  Syncope and emesis. Fall. History of endometrial cancer status post brachytherapy. EXAM: CT ANGIOGRAPHY CHEST WITH CONTRAST TECHNIQUE: Multidetector CT imaging of the chest was performed using the standard protocol during bolus administration of intravenous contrast. Multiplanar CT image reconstructions and MIPs were obtained to evaluate the vascular anatomy. RADIATION DOSE REDUCTION: This exam was performed according to the departmental dose-optimization program which includes automated exposure control, adjustment of the mA and/or kV according to patient size and/or use of iterative reconstruction technique. CONTRAST:  75mL OMNIPAQUE IOHEXOL 350 MG/ML SOLN COMPARISON:  None Available. FINDINGS: Cardiovascular: Satisfactory opacification of the pulmonary arteries to the segmental level. No evidence of pulmonary embolism. Normal heart size. No pericardial effusion. There are atherosclerotic calcifications of the aorta. Mediastinum/Nodes: No enlarged mediastinal, hilar, or axillary lymph nodes. Thyroid gland, trachea, and esophagus demonstrate no significant findings. Lungs/Pleura: Multifocal airspace and ground-glass opacities with slightly nodular component are seen in the bilateral lower lobes right middle lobe and left upper lobe. This is most significant in the left lower lobe. There is no pleural effusion or pneumothorax. Upper  Abdomen: No acute abnormality. Musculoskeletal: No chest wall abnormality. No acute or significant osseous findings. Review of the MIP images confirms the above findings. IMPRESSION: 1. No evidence for pulmonary embolism. 2. Multifocal airspace and ground-glass opacities with slightly nodular component are seen in the bilateral lower lobes, right middle  lobe and left upper lobe. This is most significant in the left lower lobe. Findings are favored as infectious/inflammatory. 3. Aortic atherosclerosis. Aortic Atherosclerosis (ICD10-I70.0). Electronically Signed   By: Darliss Cheney M.D.   On: 07/31/2023 22:35   DG Chest Portable 1 View  Result Date: 07/31/2023 CLINICAL DATA:  Vomiting and syncope. EXAM: PORTABLE CHEST 1 VIEW COMPARISON:  July 23, 2022 FINDINGS: The cardiac silhouette is mildly enlarged and unchanged in size. There is marked severity calcification of the aortic arch. Moderate severity diffusely increased interstitial lung markings are seen. Mild atelectasis and/or infiltrate is noted within the bilateral lung bases. No pleural effusion or pneumothorax are identified. Multilevel degenerative changes seen throughout the thoracic spine. IMPRESSION: 1. Moderate severity diffusely increased interstitial lung markings which may represent mild interstitial edema. 2. Mild bibasilar atelectasis and/or infiltrate. Electronically Signed   By: Aram Candela M.D.   On: 07/31/2023 22:01     TODAY-DAY OF DISCHARGE:  Subjective:   Shyna Gillogly today has no headache,no chest abdominal pain,no new weakness tingling or numbness, feels much better wants to go home today.   Objective:   Blood pressure (!) 143/83, pulse (!) 107, temperature 98.5 F (36.9 C), temperature source Oral, resp. rate 20, height 5\' 4"  (1.626 m), weight 129.5 kg, SpO2 91%.  Intake/Output Summary (Last 24 hours) at 08/06/2023 1057 Last data filed at 08/06/2023 0300 Gross per 24 hour  Intake 3 ml  Output 1650 ml  Net -1647 ml    Filed Weights   08/04/23 0500 08/05/23 0500 08/06/23 0300  Weight: 131.2 kg 130.9 kg 129.5 kg    Exam: Awake Alert, Oriented *3, No new F.N deficits, Normal affect [place].AT,PERRAL Supple Neck,No JVD, No cervical lymphadenopathy appriciated.  Symmetrical Chest wall movement, Good air movement bilaterally, CTAB RRR,No Gallops,Rubs or new Murmurs, No Parasternal Heave +ve B.Sounds, Abd Soft, Non tender, No organomegaly appriciated, No rebound -guarding or rigidity. No Cyanosis, Clubbing or edema, No new Rash or bruise   PERTINENT RADIOLOGIC STUDIES: No results found.   PERTINENT LAB RESULTS: CBC: Recent Labs    08/04/23 0306  WBC 8.6  HGB 10.7*  HCT 34.1*  PLT 162   CMET CMP     Component Value Date/Time   NA 140 08/06/2023 0155   NA 138 12/19/2022 1354   K 3.3 (L) 08/06/2023 0155   CL 98 08/06/2023 0155   CO2 30 08/06/2023 0155   GLUCOSE 168 (H) 08/06/2023 0155   BUN 26 (H) 08/06/2023 0155   BUN 33 (H) 12/19/2022 1354   CREATININE 1.14 (H) 08/06/2023 0155   CREATININE 1.19 (H) 02/04/2023 1326   CALCIUM 8.8 (L) 08/06/2023 0155   PROT 6.3 (L) 08/03/2023 0534   ALBUMIN 2.9 (L) 08/03/2023 0534   AST 28 08/03/2023 0534   ALT 20 08/03/2023 0534   ALKPHOS 117 08/03/2023 0534   BILITOT 0.9 08/03/2023 0534   GFR 45.90 (L) 05/09/2023 1410   EGFR 48 (L) 02/04/2023 1326   EGFR 44 (L) 12/19/2022 1354   GFRNONAA 51 (L) 08/06/2023 0155    GFR Estimated Creatinine Clearance: 57.8 mL/min (A) (by C-G formula based on SCr of 1.14 mg/dL (H)). No results for input(s): "LIPASE", "AMYLASE" in the last 72 hours. No results for input(s): "CKTOTAL", "CKMB", "CKMBINDEX", "TROPONINI" in the last 72 hours. Invalid input(s): "POCBNP" No results for input(s): "DDIMER" in the last 72 hours. No results for input(s): "HGBA1C" in the last 72 hours. No results for input(s): "CHOL", "HDL", "LDLCALC", "TRIG", "CHOLHDL", "LDLDIRECT" in the  last 72 hours. No results for input(s): "TSH",  "T4TOTAL", "T3FREE", "THYROIDAB" in the last 72 hours.  Invalid input(s): "FREET3" No results for input(s): "VITAMINB12", "FOLATE", "FERRITIN", "TIBC", "IRON", "RETICCTPCT" in the last 72 hours. Coags: No results for input(s): "INR" in the last 72 hours.  Invalid input(s): "PT" Microbiology: Recent Results (from the past 240 hour(s))  Culture, blood (Routine x 2)     Status: None   Collection Time: 07/31/23  8:24 PM   Specimen: BLOOD  Result Value Ref Range Status   Specimen Description   Final    BLOOD LEFT ANTECUBITAL Performed at [facility], [address], [place], [place] [address]    Special Requests   Final    BOTTLES DRAWN AEROBIC AND ANAEROBIC Blood Culture adequate volume Performed at [facility], [address] [place], [place] [address]    Culture   Final    NO GROWTH 5 DAYS Performed at [facility], [address], [place], [place] [address]    Report Status 08/05/2023 FINAL  Final  Culture, blood (Routine x 2)     Status: None   Collection Time: 07/31/23  8:53 PM   Specimen: BLOOD  Result Value Ref Range Status   Specimen Description   Final    BLOOD BLOOD LEFT ARM Performed at [facility], [address] [place], [place] [address]    Special Requests   Final    BOTTLES DRAWN AEROBIC AND ANAEROBIC Blood Culture results may not be optimal due to an excessive volume of blood received in culture bottles Performed at [facility], [address] [place], [place] [address]    Culture   Final    NO GROWTH 5 DAYS Performed at [facility], [address], [place], [place] [address]    Report Status 08/05/2023 FINAL  Final    FURTHER DISCHARGE INSTRUCTIONS:  Get Medicines reviewed and adjusted: Please take all your medications with you for your next visit with your Primary MD  Laboratory/radiological data: Please request your Primary MD to go over all hospital  tests and procedure/radiological results at the follow up, please ask your Primary MD to get all Hospital records sent to his/her office.  In some cases, they will be blood work, cultures and biopsy results pending at the time of your discharge. Please request that your primary care M.D. goes through all the records of your hospital data and follows up on these results.  Also Note the following: If you experience worsening of your admission symptoms, develop shortness of breath, life threatening emergency, suicidal or homicidal thoughts you must seek medical attention immediately by calling 911 or calling your MD immediately  if symptoms less severe.  You must read complete instructions/literature along with all the possible adverse reactions/side effects for all the Medicines you take and that have been prescribed to you. Take any new Medicines after you have completely understood and accpet all the possible adverse reactions/side effects.   Do not drive when taking Pain medications or sleeping medications (Benzodaizepines)  Do not take more than prescribed Pain, Sleep and Anxiety Medications. It is not advisable to combine anxiety,sleep and pain medications without talking with your primary care practitioner  Special Instructions: If you have smoked or chewed Tobacco  in the last 2 yrs please stop smoking, stop any regular Alcohol  and or any Recreational drug use.  Wear Seat belts while driving.  Please note: You were cared for by a hospitalist  during your hospital stay. Once you are discharged, your primary care physician will handle any further medical issues. Please note that NO REFILLS for any discharge medications will be authorized once you are discharged, as it is imperative that you return to your primary care physician (or establish a relationship with a primary care physician if you do not have one) for your post hospital discharge needs so that they can reassess your need for  medications and monitor your lab values.  Total Time spent coordinating discharge including counseling, education and face to face time equals greater than 30 minutes.  SignedJeoffrey Massed 08/06/2023 10:57 AM

## 2023-08-06 NOTE — TOC Transition Note (Signed)
Transition of Care [facility]) - CM/SW Discharge Note   Patient Details  Name: [name] MRN: [id number] Date of Birth: 04-26-1950  Transition of Care [facility]) CM/SW Contact:  Gordy Clement, RN Phone Number: 08/06/2023, 12:12 PM   Clinical Narrative:     Patient to DC to home.  Husband and Son-In-Law to transport . Patient does not require any DME for DC . She states she has a wheelchair at home.  She will be going home on new O2 to be provided by [employer]. Portable to be delivered to room. Concentrator to home after dc.   No additional TOC needs          Patient Goals and CMS Choice      Discharge Placement                         Discharge Plan and Services Additional resources added to the After Visit Summary for                                       Social Determinants of Health (SDOH) Interventions SDOH Screenings   Food Insecurity: No Food Insecurity (08/01/2023)  Housing: Low Risk  (08/01/2023)  Transportation Needs: No Transportation Needs (08/01/2023)  Utilities: Not At Risk (08/01/2023)  Alcohol Screen: Low Risk  (05/09/2023)  Depression (PHQ2-9): Low Risk  (05/09/2023)  Financial Resource Strain: Low Risk  (05/09/2023)  Physical Activity: Inactive (05/09/2023)  Social Connections: Socially Integrated (05/09/2023)  Stress: No Stress Concern Present (05/09/2023)  Tobacco Use: Medium Risk (07/31/2023)     Readmission Risk Interventions     No data to display

## 2023-08-07 ENCOUNTER — Telehealth: Payer: Self-pay

## 2023-08-07 ENCOUNTER — Ambulatory Visit: Payer: Medicare Other | Admitting: Family Medicine

## 2023-08-07 NOTE — Transitions of Care (Post Inpatient/ED Visit) (Signed)
08/07/2023  Name: [name] MRN: [id number] DOB: Apr 22, 1950  Today's TOC FU Call Status: Today's TOC FU Call Status:: Successful TOC FU Call Completed TOC FU Call Complete Date: 08/07/23  Transition Care Management Follow-up Telephone Call Date of Discharge: 08/06/23 Discharge Facility: [facility]) Type of Discharge: Inpatient Admission Primary Inpatient Discharge Diagnosis:: "PNA due to infectious organism" How have you been since you were released from the hospital?: Better (Pt states things going well so far-using oxygen as needed 2L/min-sats 95-96%. She gets a litttle SOB with exertion. Occassional dry cough-taking abxs as ordered. Appetite good. LBM yest. Slept well last night.) Any questions or concerns?: No  Items Reviewed: Did you receive and understand the discharge instructions provided?: Yes Medications obtained,verified, and reconciled?: Yes (Medications Reviewed) Any new allergies since your discharge?: No Dietary orders reviewed?: Yes Type of Diet Ordered:: low salt/heart heallthy/carb modified Do you have support at home?: Yes People in Home: spouse, child(ren), adult Name of Support/Comfort Primary Source: spouse and son assisting her as needed  Medications Reviewed Today: Medications Reviewed Today     Reviewed by Charlyn Minerva, RN (Registered Nurse) on 08/07/23 at 1330  Med List Status: <None>   Medication Order Taking? Sig Documenting Provider Last Dose Status Informant  acetaminophen (TYLENOL) 500 MG tablet [id number] Yes Take 1,000 mg by mouth every 6 (six) hours as needed for moderate pain. [provider] Taking Active Self  allopurinol (ZYLOPRIM) 300 MG tablet [id number] Yes Take 1 tablet (300 mg total) by mouth daily. Jeoffrey Massed, MD Taking Active Self  amLODipine (NORVASC) 5 MG tablet [id number] Yes Take 1 tablet (5 mg total) by mouth daily.  Patient taking differently: Take 5 mg by mouth every evening.   Jeoffrey Massed,  MD Taking Active Self  amoxicillin-clavulanate (AUGMENTIN) 875-125 MG tablet [id number] Yes Take 1 tablet by mouth 2 (two) times daily for 2 days. Ghimire, Werner Lean, MD Taking Active   apixaban (ELIQUIS) 5 MG TABS tablet [id number] Yes Take 1 tablet (5 mg total) by mouth 2 (two) times daily. Pricilla Riffle, MD Taking Active Self  atorvastatin (LIPITOR) 80 MG tablet [id number] Yes Take 1 tablet (80 mg total) by mouth at bedtime. Medina-Vargas, Monina C, NP Taking Active Self  carvedilol (COREG) 12.5 MG tablet [id number] Yes Take 1 tablet (12.5 mg total) by mouth 2 (two) times daily with a meal. Pricilla Riffle, MD Taking Active Self  Cholecalciferol (VITAMIN D3 PO) [id number] Yes Take 1 capsule by mouth daily. [provider] Taking Active Self  Coenzyme Q10 (COQ10) 100 MG CAPS [id number] Yes Take 100 mg by mouth daily. [provider] Taking Active Self  famotidine (PEPCID) 20 MG tablet [id number] Yes Take 20 mg by mouth daily.  [provider] Taking Active Self  furosemide (LASIX) 40 MG tablet [id number] Yes Take 2 tablets (80 mg) in the morning.  Patient taking differently: Take 80 mg by mouth daily. Take 2 tablets (80 mg) in the morning.   Pricilla Riffle, MD Taking Active Self  GLUCOSAMINE-CHONDROITIN PO [id number] Yes Take 1 tablet by mouth 2 (two) times daily. [provider] Taking Active Self  insulin NPH-regular Human (70-30) 100 UNIT/ML injection [id number] Yes Inject 50-70 Units into the skin See admin instructions. 70 units In the morning and 50 units in the evening [provider] Taking Active Self           Med Note Yetta Barre, EVIE   Mon Feb 04, 2023 12:53 PM)  KRILL OIL PO [id number] Yes Take 1 capsule by mouth daily. [provider] Taking Active Self           Med Note ([place], [facility]   Wed Dec 26, 2022 11:49 AM)    losartan (COZAAR) 50 MG tablet [id number] Yes Take 1 tablet (50 mg total) by mouth daily. Pricilla Riffle, MD Taking Active Self   meclizine (ANTIVERT) 12.5 MG tablet [id number] Yes Take 1 tablet (12.5 mg total) by mouth 3 (three) times daily as needed for dizziness. Wallis Bamberg, PA-C Taking Active Self  MELATONIN PO [id number] Yes Take 10 mg by mouth at bedtime. [provider] Taking Active Self  Multiple Vitamins-Minerals (MULTIVITAMIN WITH MINERALS) tablet [id number] Yes Take 1 tablet by mouth daily. [provider] Taking Active Self  potassium chloride (KLOR-CON) 10 MEQ tablet [id number] Yes Take 1 tablet (10 mEq total) by mouth 2 (two) times daily.  Patient taking differently: Take 10 mEq by mouth daily.   Pricilla Riffle, MD Taking Active Self           Med Note Jomarie Longs, [place]   Thu Aug 01, 2023  3:47 PM) Patient taking only 1 tablet daily  Vibegron (GEMTESA) 75 MG TABS [id number] Yes Take 1 tablet (75 mg total) by mouth daily. Jeoffrey Massed, MD Taking Active Self            Home Care and Equipment/Supplies: Were Home Health Services Ordered?: Yes [facility] of [facility] Agency[facility] Has Agency set up a time to come to your home?: Yes First Home Health Visit Date: 08/09/23 (Pt confirms she spoke with therapist today and home visit scheduled) Any new equipment or medical supplies ordered?: Yes Name of Medical supply agency?: [employer] Were you able to get the equipment/medical supplies?: Yes Do you have any questions related to the use of the equipment/supplies?: No  Functional Questionnaire: Do you need assistance with bathing/showering or dressing?: No Do you need assistance with meal preparation?: No Do you need assistance with eating?: No Do you have difficulty maintaining continence: No Do you need assistance with getting out of bed/getting out of a chair/moving?: No Do you have difficulty managing or taking your medications?: No  Follow up appointments reviewed: PCP Follow-up appointment confirmed?: Yes Date of PCP follow-up appointment?: 08/14/23 Follow-up Provider: Dr.  Milinda Cave Specialist [facility] Follow-up appointment confirmed?: NA Do you need transportation to your follow-up appointment?: No Do you understand care options if your condition(s) worsen?: Yes-patient verbalized understanding  SDOH Interventions Today    Flowsheet Row Most Recent Value  SDOH Interventions   Food Insecurity Interventions Intervention Not Indicated  Transportation Interventions Intervention Not Indicated      TOC Interventions Today    Flowsheet Row Most Recent Value  TOC Interventions   TOC Interventions Discussed/Reviewed TOC Interventions Discussed, S/S of infection, Arranged PCP follow up within 7 days/Care Guide scheduled      Interventions Today    Flowsheet Row Most Recent Value  Chronic Disease   Chronic disease during today's visit Hypertension (HTN), Diabetes  General Interventions   General Interventions Discussed/Reviewed General Interventions Discussed, Doctor Visits, Durable Medical Equipment (DME)  Doctor Visits Discussed/Reviewed Doctor Visits Discussed, PCP, Specialist  Durable Medical Equipment (DME) BP Cuff, Glucomoter, Oxygen, Other  [pulse ox-sats 95-96% with oxygen, pt confirms she has BP & cbg meter in home-hasn't checked since coming home-but will do so]  PCP/Specialist Visits Compliance with follow-up visit  Education Interventions   Education Provided Provided Education  Provided  Verbal Education On Nutrition, When to see the doctor, Medication, Other  [resp sx mgmt]  Nutrition Interventions   Nutrition Discussed/Reviewed Nutrition Discussed, Adding fruits and vegetables, Increasing proteins, Decreasing fats, Fluid intake, Decreasing salt, Decreasing sugar intake  Pharmacy Interventions   Pharmacy Dicussed/Reviewed Pharmacy Topics Discussed, Medications and their functions  Safety Interventions   Safety Discussed/Reviewed Safety Discussed       Alessandra Grout [facility] Care Management Care Management Community  Coordinator Direct Phone: [phone] Toll Free: [phone] Fax: [phone]

## 2023-08-09 ENCOUNTER — Ambulatory Visit: Payer: Medicare Other | Admitting: Family Medicine

## 2023-08-09 ENCOUNTER — Encounter: Payer: Self-pay | Admitting: Family Medicine

## 2023-08-09 ENCOUNTER — Telehealth: Payer: Self-pay

## 2023-08-09 DIAGNOSIS — E119 Type 2 diabetes mellitus without complications: Secondary | ICD-10-CM | POA: Diagnosis not present

## 2023-08-09 DIAGNOSIS — I700 Atherosclerosis of aorta: Secondary | ICD-10-CM | POA: Diagnosis not present

## 2023-08-09 DIAGNOSIS — N83202 Unspecified ovarian cyst, left side: Secondary | ICD-10-CM | POA: Diagnosis not present

## 2023-08-09 DIAGNOSIS — E876 Hypokalemia: Secondary | ICD-10-CM | POA: Diagnosis not present

## 2023-08-09 DIAGNOSIS — I5033 Acute on chronic diastolic (congestive) heart failure: Secondary | ICD-10-CM | POA: Diagnosis not present

## 2023-08-09 DIAGNOSIS — Z7901 Long term (current) use of anticoagulants: Secondary | ICD-10-CM | POA: Diagnosis not present

## 2023-08-09 DIAGNOSIS — Z9181 History of falling: Secondary | ICD-10-CM | POA: Diagnosis not present

## 2023-08-09 DIAGNOSIS — J9601 Acute respiratory failure with hypoxia: Secondary | ICD-10-CM | POA: Diagnosis not present

## 2023-08-09 DIAGNOSIS — C541 Malignant neoplasm of endometrium: Secondary | ICD-10-CM | POA: Diagnosis not present

## 2023-08-09 DIAGNOSIS — J690 Pneumonitis due to inhalation of food and vomit: Secondary | ICD-10-CM | POA: Diagnosis not present

## 2023-08-09 DIAGNOSIS — I480 Paroxysmal atrial fibrillation: Secondary | ICD-10-CM | POA: Diagnosis not present

## 2023-08-09 DIAGNOSIS — K8020 Calculus of gallbladder without cholecystitis without obstruction: Secondary | ICD-10-CM | POA: Diagnosis not present

## 2023-08-09 DIAGNOSIS — E785 Hyperlipidemia, unspecified: Secondary | ICD-10-CM | POA: Diagnosis not present

## 2023-08-09 DIAGNOSIS — I110 Hypertensive heart disease with heart failure: Secondary | ICD-10-CM | POA: Diagnosis not present

## 2023-08-09 DIAGNOSIS — Z794 Long term (current) use of insulin: Secondary | ICD-10-CM | POA: Diagnosis not present

## 2023-08-09 DIAGNOSIS — Z6841 Body Mass Index (BMI) 40.0 and over, adult: Secondary | ICD-10-CM | POA: Diagnosis not present

## 2023-08-09 DIAGNOSIS — Z9981 Dependence on supplemental oxygen: Secondary | ICD-10-CM | POA: Diagnosis not present

## 2023-08-09 NOTE — Telephone Encounter (Signed)
Yes okay 

## 2023-08-09 NOTE — Telephone Encounter (Signed)
[name] from [name] called for Pt approval with frequency of 1 day a week for 1 week, 2 days a week for 3 weeks, 2 times a week for 1 week. He also stated there may be a possible drug interaction with potassium chloride and Meclizine which pt states she only takes prn.

## 2023-08-12 DIAGNOSIS — J9601 Acute respiratory failure with hypoxia: Secondary | ICD-10-CM | POA: Diagnosis not present

## 2023-08-12 DIAGNOSIS — I110 Hypertensive heart disease with heart failure: Secondary | ICD-10-CM | POA: Diagnosis not present

## 2023-08-12 DIAGNOSIS — E785 Hyperlipidemia, unspecified: Secondary | ICD-10-CM | POA: Diagnosis not present

## 2023-08-12 DIAGNOSIS — J690 Pneumonitis due to inhalation of food and vomit: Secondary | ICD-10-CM | POA: Diagnosis not present

## 2023-08-12 DIAGNOSIS — I480 Paroxysmal atrial fibrillation: Secondary | ICD-10-CM | POA: Diagnosis not present

## 2023-08-12 DIAGNOSIS — I5033 Acute on chronic diastolic (congestive) heart failure: Secondary | ICD-10-CM | POA: Diagnosis not present

## 2023-08-12 NOTE — Telephone Encounter (Signed)
No call back number available.

## 2023-08-13 DIAGNOSIS — J690 Pneumonitis due to inhalation of food and vomit: Secondary | ICD-10-CM | POA: Diagnosis not present

## 2023-08-13 DIAGNOSIS — I110 Hypertensive heart disease with heart failure: Secondary | ICD-10-CM | POA: Diagnosis not present

## 2023-08-13 DIAGNOSIS — J9601 Acute respiratory failure with hypoxia: Secondary | ICD-10-CM | POA: Diagnosis not present

## 2023-08-13 DIAGNOSIS — E785 Hyperlipidemia, unspecified: Secondary | ICD-10-CM | POA: Diagnosis not present

## 2023-08-13 DIAGNOSIS — I480 Paroxysmal atrial fibrillation: Secondary | ICD-10-CM | POA: Diagnosis not present

## 2023-08-13 DIAGNOSIS — I5033 Acute on chronic diastolic (congestive) heart failure: Secondary | ICD-10-CM | POA: Diagnosis not present

## 2023-08-13 NOTE — Patient Instructions (Signed)
 It was very nice to see you today!   PLEASE NOTE:    Please give ample time to the testing facility, and our office to run, receive and review results. Please do not call inquiring of results, even if you can see them in your chart. We will contact you as soon as we are able. If it has been over 1 week since the test was completed, and you have not yet heard from [place], then please call us.   If we ordered any referrals today, please let us know if you have not heard from their office within the next 2 weeks. You should receive a letter via MyChart confirming if the referral was approved and their office contact information to schedule.

## 2023-08-14 ENCOUNTER — Ambulatory Visit (INDEPENDENT_AMBULATORY_CARE_PROVIDER_SITE_OTHER): Payer: Medicare Other | Admitting: Family Medicine

## 2023-08-14 ENCOUNTER — Encounter: Payer: Self-pay | Admitting: Family Medicine

## 2023-08-14 VITALS — BP 128/78 | HR 91 | Temp 98.0°F | Ht 64.0 in | Wt 302.6 lb

## 2023-08-14 DIAGNOSIS — J9601 Acute respiratory failure with hypoxia: Secondary | ICD-10-CM | POA: Diagnosis not present

## 2023-08-14 DIAGNOSIS — D649 Anemia, unspecified: Secondary | ICD-10-CM | POA: Diagnosis not present

## 2023-08-14 DIAGNOSIS — Z8719 Personal history of other diseases of the digestive system: Secondary | ICD-10-CM | POA: Diagnosis not present

## 2023-08-14 DIAGNOSIS — I5032 Chronic diastolic (congestive) heart failure: Secondary | ICD-10-CM | POA: Diagnosis not present

## 2023-08-14 DIAGNOSIS — N183 Chronic kidney disease, stage 3 unspecified: Secondary | ICD-10-CM | POA: Diagnosis not present

## 2023-08-14 DIAGNOSIS — K56609 Unspecified intestinal obstruction, unspecified as to partial versus complete obstruction: Secondary | ICD-10-CM

## 2023-08-14 DIAGNOSIS — R918 Other nonspecific abnormal finding of lung field: Secondary | ICD-10-CM

## 2023-08-14 DIAGNOSIS — J690 Pneumonitis due to inhalation of food and vomit: Secondary | ICD-10-CM

## 2023-08-14 NOTE — Progress Notes (Unsigned)
08/14/2023  CC:  Chief Complaint  Patient presents with   Hospitalization Follow-up    Patient is a 73 y.o. female who presents for  hospital follow up, specifically Transitional Care Services face-to-face visit. Dates hospitalized: 07/31/2023 to 08/06/2023. Days since d/c from hospital: 8 days Patient was discharged from hospital to home with home health planned.. Reason for admission to hospital: Small bowel obstruction, acute hypoxic respiratory failure secondary to aspiration pneumonia and decompensated diastolic heart failure. Date of interactive (phone) contact with patient and/or caregiver: 08/07/2023.  I have reviewed patient's discharge summary plus pertinent specific notes, labs, and imaging from the hospitalization.   She presented with nausea, vomiting, and abdominal pain.  She had syncopal episode during vomiting and describes aspiration. She was markedly short of breath and hypoxic.  Imaging confirmed small bowel obstruction and conservative management was started.  Chest imaging showed patchy interstitial and airspace opacities in multiple lobes consistent with inflammation and possible pulmonary edema.  Respiratory support was given, including 5 L nasal cannula oxygen, Unasyn, and IV Lasix. She did have acute kidney injury that returned to baseline function with IV fluid.  Imaging did incidentally detected 3.6 cm left ovarian simple appearing cyst.  Radiology recommended follow-up ultrasound in 6 to 12 months.  .   CURRENTLY: Doing very well, feels back to her usual self. Not requiring any oxygen.  No SOB at rest.  She has some cough when she takes deep breath, nonproductive. No fevers.   Eating and drinking well, no abd pain at all. Normal Bms.   Medication reconciliation was done today and patient is taking meds as recommended by discharging hospitalist/specialist.   Discharge medications included allopurinol 300 mg daily, amlodipine 5 mg daily, Augmentin twice daily x 2  days, Eliquis 5 mg twice daily, atorvastatin 80 mg/day, carvedilol 12.5 mg twice daily, Pepcid 20 mg a day, furosemide 80 mg daily, Gemtesa 75 mg daily, insulin 70/30, 70 units every morning and 50 units every afternoon.  Losartan 50 mg a day, potassium chloride 10 mEq twice a day.   ROS as above, plus--> no fevers, no CP, no SOB, no wheezing, no cough, no dizziness, no HAs, no rashes, no melena/hematochezia.  No polyuria or polydipsia.  No myalgias or arthralgias.  No focal weakness, paresthesias, or tremors.  No acute vision or hearing abnormalities.  No dysuria or unusual/new urinary urgency or frequency.  No recent changes in lower legs. No n/v/d or abd pain.  No palpitations.    PMH:  Past Medical History:  Diagnosis Date   Aspiration pneumonitis (HCC)    in hosp for SBO 08/2023   Atrial fibrillation (HCC)    persistent (outpt monitoring 08/2022)   Bilateral lower extremity edema    Carpal tunnel syndrome    bilateral, had surgery on right hand   Chronic renal insufficiency, stage 3 (moderate) (HCC)    GFR 50s   Diabetes mellitus without complication (HCC)    Dr. Sharl Ma   Diverticulosis    +hx of 'itis   Endometrial cancer [facility])    RT spring 2024   GERD (gastroesophageal reflux disease)    Gout    No problems after getting on allopurinol   Heart murmur    soft, systolic   History of blood transfusion    History of colon polyps    History of iron deficiency    Hyperlipidemia    Hypertension    Osteoarthritis, multiple sites    esp knees   SBO (small bowel obstruction) (  HCC)    adhesions (admission 07/2023)   Urinary incontinence     PSH:  Past Surgical History:  Procedure Laterality Date   CARPAL TUNNEL RELEASE Right 1991   COLON SURGERY     COLONOSCOPY     x2.  Last was 2018   COLOSTOMY     COLOSTOMY REVERSAL     DILATATION & CURETTAGE/HYSTEROSCOPY WITH MYOSURE N/A 07/17/2019   Procedure: DILATATION & CURETTAGE/HYSTEROSCOPY WITH MYOSURE;  Surgeon: Candice Camp, MD;   Location: [facility];  Service: Gynecology;  Laterality: N/A;   OOPHORECTOMY  2000   unilateral   PILONIDAL CYST EXCISION     ROBOTIC ASSISTED TOTAL HYSTERECTOMY WITH BILATERAL SALPINGO OOPHERECTOMY N/A 08/20/2019   Procedure: DIAGNSOTIC LAPAROSCOPY, DILATION AND CURETTAGE, IUD PLACEMENT ;  Surgeon: Adolphus Birchwood, MD;  Location: [facility];  Service: Gynecology;  Laterality: N/A;   SKIN CANCER EXCISION     TRANSTHORACIC ECHOCARDIOGRAM     08/2022 EF 50-55%, inc PA pressure, nl RV fxn, valves ok    MEDS:  Outpatient Medications Prior to Visit  Medication Sig Dispense Refill   acetaminophen (TYLENOL) 500 MG tablet Take 1,000 mg by mouth every 6 (six) hours as needed for moderate pain.     allopurinol (ZYLOPRIM) 300 MG tablet Take 1 tablet (300 mg total) by mouth daily. 90 tablet 1   amLODipine (NORVASC) 5 MG tablet Take 1 tablet (5 mg total) by mouth daily. (Patient taking differently: Take 5 mg by mouth every evening.) 90 tablet 1   apixaban (ELIQUIS) 5 MG TABS tablet Take 1 tablet (5 mg total) by mouth 2 (two) times daily. 180 tablet 3   atorvastatin (LIPITOR) 80 MG tablet Take 1 tablet (80 mg total) by mouth at bedtime. 90 tablet 3   carvedilol (COREG) 12.5 MG tablet Take 1 tablet (12.5 mg total) by mouth 2 (two) times daily with a meal. 180 tablet 3   Cholecalciferol (VITAMIN D3 PO) Take 1 capsule by mouth daily.     Coenzyme Q10 (COQ10) 100 MG CAPS Take 100 mg by mouth daily.     famotidine (PEPCID) 20 MG tablet Take 20 mg by mouth daily.      furosemide (LASIX) 40 MG tablet Take 2 tablets (80 mg) in the morning. (Patient taking differently: Take 80 mg by mouth daily. Take 2 tablets (80 mg) in the morning.) 270 tablet 3   GLUCOSAMINE-CHONDROITIN PO Take 1 tablet by mouth 2 (two) times daily.     insulin NPH-regular Human (70-30) 100 UNIT/ML injection Inject 50-70 Units into the skin See admin instructions. 70 units In the morning and 50 units in the evening     KRILL OIL PO Take  1 capsule by mouth daily.     losartan (COZAAR) 50 MG tablet Take 1 tablet (50 mg total) by mouth daily. 90 tablet 0   meclizine (ANTIVERT) 12.5 MG tablet Take 1 tablet (12.5 mg total) by mouth 3 (three) times daily as needed for dizziness. 30 tablet 0   MELATONIN PO Take 10 mg by mouth at bedtime.     Multiple Vitamins-Minerals (MULTIVITAMIN WITH MINERALS) tablet Take 1 tablet by mouth daily.     potassium chloride (KLOR-CON) 10 MEQ tablet Take 1 tablet (10 mEq total) by mouth 2 (two) times daily. (Patient taking differently: Take 10 mEq by mouth daily.) 180 tablet 3   Vibegron (GEMTESA) 75 MG TABS Take 1 tablet (75 mg total) by mouth daily. (Patient not taking: Reported on 08/14/2023) 30 tablet 1  No facility-administered medications prior to visit.    Physical Exam    08/14/2023    3:06 PM 08/06/2023   11:46 AM 08/06/2023    7:41 AM  Vitals with BMI  Height 5\' 4"     Weight 302 lbs 10 oz    BMI 51.92    Systolic 128 146 725  Diastolic 78 72 83  Pulse 91 89 107    Gen: Alert, well appearing.  Patient is oriented to person, place, time, and situation. Cardiovascular: Irregular irregular rhythm, rate 80s.  No audible murmur. Lungs show soft inspiratory crackles in the left base.  No egophony.  Otherwise lungs are clear and breathing is nonlabored.  Good aeration. Extremities show moderate amount of nonpitting edema.  She has trace bilateral pitting edema. No erythema.    Pertinent labs/imaging Last CBC Lab Results  Component Value Date   WBC 8.6 08/04/2023   HGB 10.7 (L) 08/04/2023   HCT 34.1 (L) 08/04/2023   MCV 103.0 (H) 08/04/2023   MCH 32.3 08/04/2023   RDW 14.6 08/04/2023   PLT 162 08/04/2023   Lab Results  Component Value Date   FERRITIN 19.9 07/23/2022   Last metabolic panel Lab Results  Component Value Date   GLUCOSE 168 (H) 08/06/2023   NA 140 08/06/2023   K 3.3 (L) 08/06/2023   CL 98 08/06/2023   CO2 30 08/06/2023   BUN 26 (H) 08/06/2023   CREATININE  1.14 (H) 08/06/2023   GFRNONAA 51 (L) 08/06/2023   CALCIUM 8.8 (L) 08/06/2023   PROT 6.3 (L) 08/03/2023   ALBUMIN 2.9 (L) 08/03/2023   BILITOT 0.9 08/03/2023   ALKPHOS 117 08/03/2023   AST 28 08/03/2023   ALT 20 08/03/2023   ANIONGAP 12 08/06/2023     ASSESSMENT/PLAN:  ***  CBC with differential and CMP today. Will need chest x-ray in 4 to 6 weeks to document resolution of infiltrates.  {Medical decision making of moderate complexity was utilized today} 99495  {Medical decision making of high complexity was utilized today} 99496  FOLLOW UP:  ***  Signed:  Santiago Bumpers, MD           08/14/2023

## 2023-08-15 ENCOUNTER — Encounter (INDEPENDENT_AMBULATORY_CARE_PROVIDER_SITE_OTHER): Payer: Self-pay

## 2023-08-15 ENCOUNTER — Other Ambulatory Visit: Payer: Self-pay | Admitting: Family Medicine

## 2023-08-15 DIAGNOSIS — R7989 Other specified abnormal findings of blood chemistry: Secondary | ICD-10-CM

## 2023-08-15 LAB — CBC WITH DIFFERENTIAL/PLATELET
Basophils Absolute: 0.1 10*3/uL (ref 0.0–0.1)
Basophils Relative: 1.3 % (ref 0.0–3.0)
Eosinophils Absolute: 0.1 10*3/uL (ref 0.0–0.7)
Eosinophils Relative: 1.3 % (ref 0.0–5.0)
HCT: 35.7 % — ABNORMAL LOW (ref 36.0–46.0)
Hemoglobin: 11.5 g/dL — ABNORMAL LOW (ref 12.0–15.0)
Lymphocytes Relative: 12.1 % (ref 12.0–46.0)
Lymphs Abs: 0.7 10*3/uL (ref 0.7–4.0)
MCHC: 32.1 g/dL (ref 30.0–36.0)
MCV: 100.5 fl — ABNORMAL HIGH (ref 78.0–100.0)
Monocytes Absolute: 0.4 10*3/uL (ref 0.1–1.0)
Monocytes Relative: 7.8 % (ref 3.0–12.0)
Neutro Abs: 4.3 10*3/uL (ref 1.4–7.7)
Neutrophils Relative %: 77.5 % — ABNORMAL HIGH (ref 43.0–77.0)
Platelets: 350 10*3/uL (ref 150.0–400.0)
RBC: 3.55 Mil/uL — ABNORMAL LOW (ref 3.87–5.11)
RDW: 15.7 % — ABNORMAL HIGH (ref 11.5–15.5)
WBC: 5.5 10*3/uL (ref 4.0–10.5)

## 2023-08-15 LAB — COMPREHENSIVE METABOLIC PANEL
ALT: 21 U/L (ref 0–35)
AST: 16 U/L (ref 0–37)
Albumin: 3.7 g/dL (ref 3.5–5.2)
Alkaline Phosphatase: 111 U/L (ref 39–117)
BUN: 36 mg/dL — ABNORMAL HIGH (ref 6–23)
CO2: 27 meq/L (ref 19–32)
Calcium: 9.3 mg/dL (ref 8.4–10.5)
Chloride: 100 meq/L (ref 96–112)
Creatinine, Ser: 1.7 mg/dL — ABNORMAL HIGH (ref 0.40–1.20)
GFR: 29.26 mL/min — ABNORMAL LOW (ref >60.00)
Glucose, Bld: 264 mg/dL — ABNORMAL HIGH (ref 70–99)
Potassium: 4.8 meq/L (ref 3.5–5.1)
Sodium: 136 meq/L (ref 135–145)
Total Bilirubin: 0.5 mg/dL (ref 0.2–1.2)
Total Protein: 6.1 g/dL (ref 6.0–8.3)

## 2023-08-16 ENCOUNTER — Ambulatory Visit (HOSPITAL_COMMUNITY): Payer: Medicare Other

## 2023-08-16 DIAGNOSIS — J690 Pneumonitis due to inhalation of food and vomit: Secondary | ICD-10-CM | POA: Diagnosis not present

## 2023-08-16 DIAGNOSIS — I110 Hypertensive heart disease with heart failure: Secondary | ICD-10-CM | POA: Diagnosis not present

## 2023-08-16 DIAGNOSIS — E785 Hyperlipidemia, unspecified: Secondary | ICD-10-CM | POA: Diagnosis not present

## 2023-08-16 DIAGNOSIS — I5033 Acute on chronic diastolic (congestive) heart failure: Secondary | ICD-10-CM | POA: Diagnosis not present

## 2023-08-16 DIAGNOSIS — I480 Paroxysmal atrial fibrillation: Secondary | ICD-10-CM | POA: Diagnosis not present

## 2023-08-16 DIAGNOSIS — J9601 Acute respiratory failure with hypoxia: Secondary | ICD-10-CM | POA: Diagnosis not present

## 2023-08-20 ENCOUNTER — Telehealth: Payer: Self-pay

## 2023-08-20 ENCOUNTER — Other Ambulatory Visit (INDEPENDENT_AMBULATORY_CARE_PROVIDER_SITE_OTHER): Payer: Medicare Other

## 2023-08-20 DIAGNOSIS — J690 Pneumonitis due to inhalation of food and vomit: Secondary | ICD-10-CM | POA: Diagnosis not present

## 2023-08-20 DIAGNOSIS — N83202 Unspecified ovarian cyst, left side: Secondary | ICD-10-CM | POA: Diagnosis not present

## 2023-08-20 DIAGNOSIS — J9601 Acute respiratory failure with hypoxia: Secondary | ICD-10-CM | POA: Diagnosis not present

## 2023-08-20 DIAGNOSIS — R7989 Other specified abnormal findings of blood chemistry: Secondary | ICD-10-CM | POA: Diagnosis not present

## 2023-08-20 DIAGNOSIS — E876 Hypokalemia: Secondary | ICD-10-CM | POA: Diagnosis not present

## 2023-08-20 DIAGNOSIS — E785 Hyperlipidemia, unspecified: Secondary | ICD-10-CM | POA: Diagnosis not present

## 2023-08-20 DIAGNOSIS — C541 Malignant neoplasm of endometrium: Secondary | ICD-10-CM | POA: Diagnosis not present

## 2023-08-20 DIAGNOSIS — I700 Atherosclerosis of aorta: Secondary | ICD-10-CM | POA: Diagnosis not present

## 2023-08-20 DIAGNOSIS — I480 Paroxysmal atrial fibrillation: Secondary | ICD-10-CM | POA: Diagnosis not present

## 2023-08-20 DIAGNOSIS — K8020 Calculus of gallbladder without cholecystitis without obstruction: Secondary | ICD-10-CM | POA: Diagnosis not present

## 2023-08-20 DIAGNOSIS — I5033 Acute on chronic diastolic (congestive) heart failure: Secondary | ICD-10-CM | POA: Diagnosis not present

## 2023-08-20 DIAGNOSIS — I110 Hypertensive heart disease with heart failure: Secondary | ICD-10-CM | POA: Diagnosis not present

## 2023-08-20 DIAGNOSIS — E119 Type 2 diabetes mellitus without complications: Secondary | ICD-10-CM | POA: Diagnosis not present

## 2023-08-20 LAB — BASIC METABOLIC PANEL
BUN: 17 mg/dL (ref 6–23)
CO2: 27 mEq/L (ref 19–32)
Calcium: 9.2 mg/dL (ref 8.4–10.5)
Chloride: 107 mEq/L (ref 96–112)
Creatinine, Ser: 0.9 mg/dL (ref 0.40–1.20)
GFR: 62.76 mL/min (ref >60.00)
Glucose, Bld: 153 mg/dL — ABNORMAL HIGH (ref 70–99)
Potassium: 4.7 mEq/L (ref 3.5–5.1)
Sodium: 141 mEq/L (ref 135–145)

## 2023-08-20 NOTE — Telephone Encounter (Signed)
Home health orders received 08/20/23 for [facility] health initiation orders: Yes.  Home health re-certification orders: No. Patient last seen by ordering physician for this condition: 08/14/23. Must be less than 90 days for re-certification and less than 30 days prior for initiation. Visit must have been for the condition the orders are being placed.  Patient meets criteria for Physician to sign orders: Yes.        Current med list has been attached: Yes        Orders placed on physicians desk for signature: 08/20/23 (date) If patient does not meet criteria for orders to be signed: pt was called to schedule appt. Appt is scheduled for 09/11/23.   [name] D Ramonia Mcclaran   Placed on PCP desk to review and sign, if appropriate.

## 2023-08-21 ENCOUNTER — Ambulatory Visit (HOSPITAL_COMMUNITY)
Admission: RE | Admit: 2023-08-21 | Discharge: 2023-08-21 | Disposition: A | Discharge status: Home / Self Care | Payer: Medicare Other | Source: Ambulatory Visit | Attending: Gynecologic Oncology | Admitting: Gynecologic Oncology

## 2023-08-21 ENCOUNTER — Other Ambulatory Visit: Payer: Self-pay | Admitting: Family Medicine

## 2023-08-21 DIAGNOSIS — N854 Malposition of uterus: Secondary | ICD-10-CM | POA: Diagnosis not present

## 2023-08-21 DIAGNOSIS — C541 Malignant neoplasm of endometrium: Secondary | ICD-10-CM | POA: Diagnosis not present

## 2023-08-21 DIAGNOSIS — C801 Malignant (primary) neoplasm, unspecified: Secondary | ICD-10-CM | POA: Diagnosis not present

## 2023-08-21 DIAGNOSIS — K5730 Diverticulosis of large intestine without perforation or abscess without bleeding: Secondary | ICD-10-CM | POA: Diagnosis not present

## 2023-08-21 MED ORDER — GADOBUTROL 1 MMOL/ML IV SOLN
10.0000 mL | Freq: Once | INTRAVENOUS | Status: AC | PRN
  Administered 2023-08-21: 10 mL via INTRAVENOUS

## 2023-08-21 MED ORDER — FUROSEMIDE 40 MG PO TABS: ORAL_TABLET | ORAL | 1 refills | Status: DC

## 2023-08-21 NOTE — Telephone Encounter (Signed)
Signed and put in box to go up front. Signed:  Santiago Bumpers, MD           08/21/2023

## 2023-08-22 DIAGNOSIS — J9601 Acute respiratory failure with hypoxia: Secondary | ICD-10-CM | POA: Diagnosis not present

## 2023-08-22 DIAGNOSIS — E785 Hyperlipidemia, unspecified: Secondary | ICD-10-CM | POA: Diagnosis not present

## 2023-08-22 DIAGNOSIS — I110 Hypertensive heart disease with heart failure: Secondary | ICD-10-CM | POA: Diagnosis not present

## 2023-08-22 DIAGNOSIS — I5033 Acute on chronic diastolic (congestive) heart failure: Secondary | ICD-10-CM | POA: Diagnosis not present

## 2023-08-22 DIAGNOSIS — I480 Paroxysmal atrial fibrillation: Secondary | ICD-10-CM | POA: Diagnosis not present

## 2023-08-22 DIAGNOSIS — J690 Pneumonitis due to inhalation of food and vomit: Secondary | ICD-10-CM | POA: Diagnosis not present

## 2023-08-27 DIAGNOSIS — I5033 Acute on chronic diastolic (congestive) heart failure: Secondary | ICD-10-CM | POA: Diagnosis not present

## 2023-08-27 DIAGNOSIS — J690 Pneumonitis due to inhalation of food and vomit: Secondary | ICD-10-CM | POA: Diagnosis not present

## 2023-08-27 DIAGNOSIS — E785 Hyperlipidemia, unspecified: Secondary | ICD-10-CM | POA: Diagnosis not present

## 2023-08-27 DIAGNOSIS — I110 Hypertensive heart disease with heart failure: Secondary | ICD-10-CM | POA: Diagnosis not present

## 2023-08-27 DIAGNOSIS — I480 Paroxysmal atrial fibrillation: Secondary | ICD-10-CM | POA: Diagnosis not present

## 2023-08-27 DIAGNOSIS — J9601 Acute respiratory failure with hypoxia: Secondary | ICD-10-CM | POA: Diagnosis not present

## 2023-08-29 DIAGNOSIS — I5033 Acute on chronic diastolic (congestive) heart failure: Secondary | ICD-10-CM | POA: Diagnosis not present

## 2023-08-29 DIAGNOSIS — J9601 Acute respiratory failure with hypoxia: Secondary | ICD-10-CM | POA: Diagnosis not present

## 2023-08-29 DIAGNOSIS — J690 Pneumonitis due to inhalation of food and vomit: Secondary | ICD-10-CM | POA: Diagnosis not present

## 2023-08-29 DIAGNOSIS — I110 Hypertensive heart disease with heart failure: Secondary | ICD-10-CM | POA: Diagnosis not present

## 2023-08-29 DIAGNOSIS — I480 Paroxysmal atrial fibrillation: Secondary | ICD-10-CM | POA: Diagnosis not present

## 2023-08-29 DIAGNOSIS — E785 Hyperlipidemia, unspecified: Secondary | ICD-10-CM | POA: Diagnosis not present

## 2023-09-03 ENCOUNTER — Other Ambulatory Visit: Payer: Self-pay | Admitting: Medical Genetics

## 2023-09-03 DIAGNOSIS — I5033 Acute on chronic diastolic (congestive) heart failure: Secondary | ICD-10-CM | POA: Diagnosis not present

## 2023-09-03 DIAGNOSIS — Z006 Encounter for examination for normal comparison and control in clinical research program: Secondary | ICD-10-CM

## 2023-09-03 DIAGNOSIS — J9601 Acute respiratory failure with hypoxia: Secondary | ICD-10-CM | POA: Diagnosis not present

## 2023-09-03 DIAGNOSIS — I480 Paroxysmal atrial fibrillation: Secondary | ICD-10-CM | POA: Diagnosis not present

## 2023-09-03 DIAGNOSIS — J690 Pneumonitis due to inhalation of food and vomit: Secondary | ICD-10-CM | POA: Diagnosis not present

## 2023-09-03 DIAGNOSIS — E785 Hyperlipidemia, unspecified: Secondary | ICD-10-CM | POA: Diagnosis not present

## 2023-09-03 DIAGNOSIS — I110 Hypertensive heart disease with heart failure: Secondary | ICD-10-CM | POA: Diagnosis not present

## 2023-09-05 ENCOUNTER — Encounter: Payer: Self-pay | Admitting: Family Medicine

## 2023-09-05 ENCOUNTER — Other Ambulatory Visit (HOSPITAL_COMMUNITY): Payer: Self-pay

## 2023-09-05 ENCOUNTER — Encounter (HOSPITAL_COMMUNITY): Payer: Self-pay | Admitting: Pharmacist

## 2023-09-05 ENCOUNTER — Other Ambulatory Visit: Payer: Self-pay | Admitting: Internal Medicine

## 2023-09-05 DIAGNOSIS — E782 Mixed hyperlipidemia: Secondary | ICD-10-CM

## 2023-09-05 MED ORDER — LOSARTAN POTASSIUM 50 MG PO TABS
50.0000 mg | ORAL_TABLET | Freq: Every day | ORAL | 0 refills | Status: DC
  Filled 2023-09-05 – 2023-10-03 (×3): qty 30, 30d supply, fill #0

## 2023-09-05 MED ORDER — ATORVASTATIN CALCIUM 80 MG PO TABS
80.0000 mg | ORAL_TABLET | Freq: Every day | ORAL | 1 refills | Status: DC
Start: 2023-09-05 — End: 2023-09-11

## 2023-09-05 NOTE — Telephone Encounter (Signed)
Yes okay 

## 2023-09-06 ENCOUNTER — Other Ambulatory Visit (HOSPITAL_COMMUNITY): Payer: Self-pay

## 2023-09-08 DIAGNOSIS — E876 Hypokalemia: Secondary | ICD-10-CM | POA: Diagnosis not present

## 2023-09-08 DIAGNOSIS — C541 Malignant neoplasm of endometrium: Secondary | ICD-10-CM | POA: Diagnosis not present

## 2023-09-08 DIAGNOSIS — Z9981 Dependence on supplemental oxygen: Secondary | ICD-10-CM | POA: Diagnosis not present

## 2023-09-08 DIAGNOSIS — I700 Atherosclerosis of aorta: Secondary | ICD-10-CM | POA: Diagnosis not present

## 2023-09-08 DIAGNOSIS — I5033 Acute on chronic diastolic (congestive) heart failure: Secondary | ICD-10-CM | POA: Diagnosis not present

## 2023-09-08 DIAGNOSIS — K8020 Calculus of gallbladder without cholecystitis without obstruction: Secondary | ICD-10-CM | POA: Diagnosis not present

## 2023-09-08 DIAGNOSIS — N83202 Unspecified ovarian cyst, left side: Secondary | ICD-10-CM | POA: Diagnosis not present

## 2023-09-08 DIAGNOSIS — E785 Hyperlipidemia, unspecified: Secondary | ICD-10-CM | POA: Diagnosis not present

## 2023-09-08 DIAGNOSIS — Z794 Long term (current) use of insulin: Secondary | ICD-10-CM | POA: Diagnosis not present

## 2023-09-08 DIAGNOSIS — Z6841 Body Mass Index (BMI) 40.0 and over, adult: Secondary | ICD-10-CM | POA: Diagnosis not present

## 2023-09-08 DIAGNOSIS — Z7901 Long term (current) use of anticoagulants: Secondary | ICD-10-CM | POA: Diagnosis not present

## 2023-09-08 DIAGNOSIS — E119 Type 2 diabetes mellitus without complications: Secondary | ICD-10-CM | POA: Diagnosis not present

## 2023-09-08 DIAGNOSIS — J690 Pneumonitis due to inhalation of food and vomit: Secondary | ICD-10-CM | POA: Diagnosis not present

## 2023-09-08 DIAGNOSIS — J9601 Acute respiratory failure with hypoxia: Secondary | ICD-10-CM | POA: Diagnosis not present

## 2023-09-08 DIAGNOSIS — I480 Paroxysmal atrial fibrillation: Secondary | ICD-10-CM | POA: Diagnosis not present

## 2023-09-08 DIAGNOSIS — Z9181 History of falling: Secondary | ICD-10-CM | POA: Diagnosis not present

## 2023-09-08 DIAGNOSIS — I110 Hypertensive heart disease with heart failure: Secondary | ICD-10-CM | POA: Diagnosis not present

## 2023-09-09 ENCOUNTER — Ambulatory Visit (HOSPITAL_BASED_OUTPATIENT_CLINIC_OR_DEPARTMENT_OTHER)
Admission: RE | Admit: 2023-09-09 | Discharge: 2023-09-09 | Disposition: A | Discharge status: Home / Self Care | Payer: Medicare Other | Source: Ambulatory Visit | Attending: Family Medicine | Admitting: Family Medicine

## 2023-09-09 DIAGNOSIS — R918 Other nonspecific abnormal finding of lung field: Secondary | ICD-10-CM | POA: Insufficient documentation

## 2023-09-10 DIAGNOSIS — I5033 Acute on chronic diastolic (congestive) heart failure: Secondary | ICD-10-CM | POA: Diagnosis not present

## 2023-09-10 DIAGNOSIS — I110 Hypertensive heart disease with heart failure: Secondary | ICD-10-CM | POA: Diagnosis not present

## 2023-09-10 DIAGNOSIS — E785 Hyperlipidemia, unspecified: Secondary | ICD-10-CM | POA: Diagnosis not present

## 2023-09-10 DIAGNOSIS — I480 Paroxysmal atrial fibrillation: Secondary | ICD-10-CM | POA: Diagnosis not present

## 2023-09-10 DIAGNOSIS — J690 Pneumonitis due to inhalation of food and vomit: Secondary | ICD-10-CM | POA: Diagnosis not present

## 2023-09-10 DIAGNOSIS — J9601 Acute respiratory failure with hypoxia: Secondary | ICD-10-CM | POA: Diagnosis not present

## 2023-09-10 NOTE — Patient Instructions (Signed)

## 2023-09-11 ENCOUNTER — Ambulatory Visit (INDEPENDENT_AMBULATORY_CARE_PROVIDER_SITE_OTHER): Payer: Medicare Other | Admitting: Family Medicine

## 2023-09-11 ENCOUNTER — Encounter: Payer: Self-pay | Admitting: Family Medicine

## 2023-09-11 VITALS — BP 110/72 | HR 72 | Temp 97.9°F | Wt 307.0 lb

## 2023-09-11 DIAGNOSIS — I10 Essential (primary) hypertension: Secondary | ICD-10-CM

## 2023-09-11 DIAGNOSIS — Z23 Encounter for immunization: Secondary | ICD-10-CM

## 2023-09-11 DIAGNOSIS — E782 Mixed hyperlipidemia: Secondary | ICD-10-CM

## 2023-09-11 DIAGNOSIS — M1A9XX0 Chronic gout, unspecified, without tophus (tophi): Secondary | ICD-10-CM

## 2023-09-11 DIAGNOSIS — J9601 Acute respiratory failure with hypoxia: Secondary | ICD-10-CM

## 2023-09-11 DIAGNOSIS — J690 Pneumonitis due to inhalation of food and vomit: Secondary | ICD-10-CM | POA: Diagnosis not present

## 2023-09-11 MED ORDER — AMLODIPINE BESYLATE 5 MG PO TABS
5.0000 mg | ORAL_TABLET | Freq: Every day | ORAL | 1 refills | Status: DC
Start: 2023-09-11 — End: 2024-03-12

## 2023-09-11 MED ORDER — ALLOPURINOL 300 MG PO TABS
300.0000 mg | ORAL_TABLET | Freq: Every day | ORAL | 1 refills | Status: DC
Start: 2023-09-11 — End: 2024-03-12

## 2023-09-11 MED ORDER — ATORVASTATIN CALCIUM 80 MG PO TABS
80.0000 mg | ORAL_TABLET | Freq: Every day | ORAL | 1 refills | Status: DC
Start: 2023-09-11 — End: 2024-03-12

## 2023-09-11 NOTE — Progress Notes (Signed)
OFFICE VISIT  09/11/2023  CC:  Chief Complaint  Patient presents with   Follow-up    4 week follow up. She had a chest X ray on Monday but is not sure of the results.    Patient is a 73 y.o. female who presents for 1 month follow-up pneumonia, hypoxic respiratory failure, and small bowel obstruction. A/P as of last visit: "#1 small bowel obstruction. Resolved with nonsurgical treatment.   #2 acute hypoxic respiratory failure due to aspiration pneumonia. Resolved. She has no oxygen requirements at this time.   #3 aspiration pneumonia with left sided effusion. She has left base inspiratory crackles today. She is asymptomatic. She has finished all antibiotics. Will give this longer to resolve, no new medications at this time. Plan repeat chest x-ray in 4 to 6 weeks. CBC with differential and CMP today.   #4 chronic diastolic heart failure. No signs of fluid overload. Continue Lasix 80 mg every morning, carvedilol 12.5 mg twice daily, and losartan 50 mg a day. Electrolytes and creatinine today.   5.  Chronic renal insufficiency stage III. Monitor electrolytes and creatinine today."  INTERIM HX: Labs were great last visit. She had a chest x-ray 2 days ago but it has not been read by the radiologist yet. My reading: borderline cardiomegaly, no infiltrate or effusion or pulm edema.  She feels well.  She has no cough.  No shortness of breath.  No fever. She is eating and drinking normally.  Past Medical History:  Diagnosis Date   Aspiration pneumonitis (HCC)    in hosp for SBO 08/2023   Atrial fibrillation (HCC)    persistent (outpt monitoring 08/2022)   Bilateral lower extremity edema    Carpal tunnel syndrome    bilateral, had surgery on right hand   Chronic renal insufficiency, stage 3 (moderate) (HCC)    GFR 50s   Diabetes mellitus without complication (HCC)    Dr. Sharl Ma   Diverticulosis    +hx of 'itis   Endometrial cancer [facility])    RT spring 2024   GERD  (gastroesophageal reflux disease)    Gout    No problems after getting on allopurinol   Heart murmur    soft, systolic   History of blood transfusion    History of colon polyps    History of iron deficiency    Hyperlipidemia    Hypertension    Osteoarthritis, multiple sites    esp knees   SBO (small bowel obstruction) (HCC)    adhesions (admission 07/2023)   Urinary incontinence     Past Surgical History:  Procedure Laterality Date   CARPAL TUNNEL RELEASE Bilateral    R 1991.  L 2024   COLON SURGERY     COLONOSCOPY     x2.  Last was 2018   COLOSTOMY     COLOSTOMY REVERSAL     DILATATION & CURETTAGE/HYSTEROSCOPY WITH MYOSURE N/A 07/17/2019   Procedure: DILATATION & CURETTAGE/HYSTEROSCOPY WITH MYOSURE;  Surgeon: Candice Camp, MD;  Location: [facility];  Service: Gynecology;  Laterality: N/A;   OOPHORECTOMY  2000   unilateral   PILONIDAL CYST EXCISION     ROBOTIC ASSISTED TOTAL HYSTERECTOMY WITH BILATERAL SALPINGO OOPHERECTOMY N/A 08/20/2019   Procedure: DIAGNSOTIC LAPAROSCOPY, DILATION AND CURETTAGE, IUD PLACEMENT ;  Surgeon: Adolphus Birchwood, MD;  Location: [facility];  Service: Gynecology;  Laterality: N/A;   SKIN CANCER EXCISION     TRANSTHORACIC ECHOCARDIOGRAM     08/2022 EF 50-55%, inc PA pressure, nl RV  fxn, valves ok    Outpatient Medications Prior to Visit  Medication Sig Dispense Refill   acetaminophen (TYLENOL) 500 MG tablet Take 1,000 mg by mouth every 6 (six) hours as needed for moderate pain.     apixaban (ELIQUIS) 5 MG TABS tablet Take 1 tablet (5 mg total) by mouth 2 (two) times daily. 180 tablet 3   carvedilol (COREG) 12.5 MG tablet Take 1 tablet (12.5 mg total) by mouth 2 (two) times daily with a meal. 180 tablet 3   Cholecalciferol (VITAMIN D3 PO) Take 1 capsule by mouth daily.     Coenzyme Q10 (COQ10) 100 MG CAPS Take 100 mg by mouth daily.     famotidine (PEPCID) 20 MG tablet Take 20 mg by mouth daily.      furosemide (LASIX) 40 MG tablet Take 1  tablet in the morning. 90 tablet 1   GLUCOSAMINE-CHONDROITIN PO Take 1 tablet by mouth 2 (two) times daily.     insulin NPH-regular Human (70-30) 100 UNIT/ML injection Inject 50-70 Units into the skin See admin instructions. 70 units In the morning and 50 units in the evening     KRILL OIL PO Take 1 capsule by mouth daily.     losartan (COZAAR) 50 MG tablet Take 1 tablet (50 mg total) by mouth daily. 30 tablet 0   meclizine (ANTIVERT) 12.5 MG tablet Take 1 tablet (12.5 mg total) by mouth 3 (three) times daily as needed for dizziness. 30 tablet 0   MELATONIN PO Take 10 mg by mouth at bedtime.     Multiple Vitamins-Minerals (MULTIVITAMIN WITH MINERALS) tablet Take 1 tablet by mouth daily.     potassium chloride (KLOR-CON) 10 MEQ tablet Take 1 tablet (10 mEq total) by mouth 2 (two) times daily. (Patient taking differently: Take 10 mEq by mouth daily.) 180 tablet 3   allopurinol (ZYLOPRIM) 300 MG tablet Take 1 tablet (300 mg total) by mouth daily. 90 tablet 1   amLODipine (NORVASC) 5 MG tablet Take 1 tablet (5 mg total) by mouth daily. (Patient taking differently: Take 5 mg by mouth every evening.) 90 tablet 1   atorvastatin (LIPITOR) 80 MG tablet Take 1 tablet (80 mg total) by mouth at bedtime. 90 tablet 3   atorvastatin (LIPITOR) 80 MG tablet Take 1 tablet (80 mg total) by mouth daily. 90 tablet 1   No facility-administered medications prior to visit.    Allergies  Allergen Reactions   Lisinopril Cough    Other reaction(s): cough, Not available   Actos [Pioglitazone]     Unknown reaction   Metformin Other (See Comments)   Metformin And Related     Gas     Review of Systems As per HPI  PE:    09/11/2023   10:18 AM 08/14/2023    3:06 PM 08/06/2023   11:46 AM  Vitals with BMI  Height  5\' 4"    Weight 307 lbs 302 lbs 10 oz   BMI 52.67 51.92   Systolic 110 128 409  Diastolic 72 78 72  Pulse 72 91 89     Physical Exam  Gen: Alert, well appearing.  Patient is oriented to person,  place, time, and situation. AFFECT: pleasant, lucid thought and speech. CV: Irregularly irregular, rate in the 70s, fairly distant S1 and S2, trace systolic murmur. Lungs are clear bilaterally, breathing is nonlabored.   LABS:  Last CBC Lab Results  Component Value Date   WBC 5.5 08/14/2023   HGB 11.5 (L) 08/14/2023  HCT 35.7 (L) 08/14/2023   MCV 100.5 (H) 08/14/2023   MCH 32.3 08/04/2023   RDW 15.7 (H) 08/14/2023   PLT 350.0 08/14/2023   Lab Results  Component Value Date   FERRITIN 19.9 07/23/2022   Last metabolic panel Lab Results  Component Value Date   GLUCOSE 153 (H) 08/20/2023   NA 141 08/20/2023   K 4.7 08/20/2023   CL 107 08/20/2023   CO2 27 08/20/2023   BUN 17 08/20/2023   CREATININE 0.90 08/20/2023   GFR 62.76 08/20/2023   CALCIUM 9.2 08/20/2023   PROT 6.1 08/14/2023   ALBUMIN 3.7 08/14/2023   BILITOT 0.5 08/14/2023   ALKPHOS 111 08/14/2023   AST 16 08/14/2023   ALT 21 08/14/2023   ANIONGAP 12 08/06/2023   Lab Results  Component Value Date   HGBA1C 5.9 (H) 08/01/2023   IMPRESSION AND PLAN:  #1 acute hypoxic respiratory failure secondary to aspiration pneumonia. Lung exam normal today. Chest x-ray 2 days ago normal to my reading other than stable mild cardiomegaly. Awaiting radiologist overread. No changes today.  An After Visit Summary was printed and given to the patient.  FOLLOW UP: Return in about 3 months (around 12/11/2023) for routine chronic illness f/u.  Signed:  Santiago Bumpers, MD           09/11/2023

## 2023-09-12 ENCOUNTER — Other Ambulatory Visit (HOSPITAL_COMMUNITY): Payer: Self-pay

## 2023-09-12 ENCOUNTER — Other Ambulatory Visit: Payer: Self-pay

## 2023-09-12 DIAGNOSIS — I110 Hypertensive heart disease with heart failure: Secondary | ICD-10-CM | POA: Diagnosis not present

## 2023-09-12 DIAGNOSIS — E785 Hyperlipidemia, unspecified: Secondary | ICD-10-CM | POA: Diagnosis not present

## 2023-09-12 DIAGNOSIS — I5033 Acute on chronic diastolic (congestive) heart failure: Secondary | ICD-10-CM | POA: Diagnosis not present

## 2023-09-12 DIAGNOSIS — I480 Paroxysmal atrial fibrillation: Secondary | ICD-10-CM | POA: Diagnosis not present

## 2023-09-12 DIAGNOSIS — J9601 Acute respiratory failure with hypoxia: Secondary | ICD-10-CM | POA: Diagnosis not present

## 2023-09-12 DIAGNOSIS — J690 Pneumonitis due to inhalation of food and vomit: Secondary | ICD-10-CM | POA: Diagnosis not present

## 2023-09-12 NOTE — Progress Notes (Signed)
Gynecologic Oncology Return Clinic Visit  09/13/23  Reason for Visit: surveillance  Treatment History: Patient reports a history of postmenopausal bleeding since July 2020.  Is been persistent low low level bleeding, spotting but no heavy passage of clots.  She reported this to her gynecologist who ordered a transvaginal ultrasound on June 24, 2019.  This revealed a uterus measuring 6.7 x 3.4 x 5.5 cm with an endometrial thickness of 1.4 cm.  The ovaries were not visualized.   She was taken to the operating room for a curettage and polypectomy on July 17, 2019 which revealed FIGO grade 1 endometrioid adenocarcinoma.   Patient has a complex medical history.  She is morbidly obese with a BMI of 48 kg/m.  She experienced ruptured diverticulitis in 1997 which required a diverting colostomy and Hartman's pouch.  She 6 weeks later she was taken to the operating room for laparotomy and reversal of Hartman's.  She reports that the surgeon described her abdomen is looking like a gallon of superglue had been poured inside her.  It was an extensively long surgery.  She is unsure if additional bowel resections were necessary.  She had a 1 month hospitalization following this reversal of colostomy.  She developed a bowel obstruction during that hospitalization and required reoperation to resolve this.   In 2000 she developed a growth on an ovary (she cannot remember which side).  For this she underwent laparotomy and oophorectomy.  This surgery was performed in [place].  She was planning to have a hysterectomy at that procedure, however the surgeon aborted plans for hysterectomy and performed only oophorectomy after encountering significant adhesive disease.  She states that he told her surgery would have needed to the last 6 to 8 hours in order to perform hysterectomy due to the adhesive disease.   The patient has type 2 diabetes mellitus and has had this for greater than 30 years.  She reports having no  endorgan disease.  Her hemoglobin A1c was 5.4% in May 2020.   Her family history is significant for a father with skin cancer.  She has 3 sisters with cancer including one with endometrial cancer who is obese, a sister with stage II 3 breast cancer, and multiple myeloma.   She has had 2 prior vaginal deliveries.   She lives in [place].   On August 20, 2019 she underwent an attempt at a robotic assisted total hysterectomy BSO and sentinel lymph node biopsy.  The surgery was aborted after laparoscopic entry into the left upper quadrant encountered dense adhesions throughout the entirety of the abdomen which included dense colonic and small bowel adhesions to the anterior abdominal wall.  The density of these adhesions and their comprehensive nature across the entirety of her abdominal wall meant that it was not possible to place additional laparoscopic ports to perform adhesiolysis.  She would have required a laparotomy in order to achieve hysterectomy, and the patient had declined this preoperatively, and had elected to proceed with D&C with progestin releasing IUD placement if hysterectomy was not possible via minimally invasive surgical route.  Therefore we aborted the laparoscopic procedure and performed a D&C procedure with placement of a progestin releasing IUD.   Final pathology from the [facility] specimen showed adenocarcinoma favor endometrial primary.  It was difficult to grade the specimen due to his fragmented nature.   Repeat biopsy (office) sampling in December, 2020 showed complete clinical response with no residual carcinoma.    The patient then transferred care to Dr.  Pineda in [place].  Repeat endometrial biopsy on 03/23/2021 showed residual endometrioid adenocarcinoma.   Repeat biopsy on 06/22/2021 showed FIGO grade 2 endometrioid adenocarcinoma.  There is a comment that her Megace, which must of been started prior to this, was increased to 160 mg twice daily.   Endometrial biopsy on  09/22/2021 showed residual endometrioid adenocarcinoma, FIGO grade 1-2, in the background of endometrium with exogenous hormone effect.   Pelvic MRI on 12/05/2021 noted a 3.3 cm simple left ovarian cyst, colonic diverticulosis, anterior endometrial mass in the uterine body with an IUD adjacent, suspicious for endometrial cancer.   Repeat endometrial biopsy on 02/01/2022 showed residual endometrial carcinoma, FIGO grade 1.   Staging CT of the chest, abdomen, and pelvis on 02/15/2022 revealed no adenopathy, no aggressive appearing osseous lesions.  Diastases recti with superimposed shallow ventral wall hernia measuring 1 x 7 cm.  Cholelithiasis noted without acute cholecystitis.   03/27/22: IUD removed.   Patient proceeded with definitive radiation therapy from 03/06/2022 - 04/18/2022.  She was treated with EBRT (4500 cGy in 25 fractions) utilizing IMRT treatment technique.  This was followed by weekly T&O HDR treatment to a total dose of 3000 cGy in 5 fractions.  Total treatment dose was 7500 cGy.  Treatment was tolerated well other than fatigue and mild loose stools.   Posttreatment pelvic MRI on 06/12/2022 showed a slightly less bulky uterus.  Significant decrease in the endometrium compared to previous exam with endometrium now measuring 4 mm in thickness (previously was 2.7 cm).  Slight heterogenous signal in the endometrium.  No adenopathy.   She was last seen for follow-up on 06/20/2022 with radiation oncology.  She was doing well at that time without any vaginal bleeding or cramping.   MRI pelvis on 12/10/2022: Loss of normal differentiation between endometrium and zonal anatomy in general at the fundus and mid uterus.  Diffusion properties in this area are nondescript with no focal area of marked restricted diffusion.  Imaging findings are nonspecific although could reflect baseline treatment changes.  Small volume ascites may be reactive.  Report was addended after prior imaging was obtained.  Loss of  defined endometrium could be seen following radiotherapy.  Imaging findings remain more suggestive of posttreatment related changes from radiation but short interval follow-up suggested.   MRI pelvis on 03/09/2023 reveals unchanged appearance of the uterus, with diffuse loss of endometrial definition and mild T2 hyperintensity of the myometrium.  Normal appearance of the cervix and endocervical canal.  Unchanged left ovarian cyst measuring up to 3.9 cm, similar in size dating back to 2017 with no further follow-up recommended.  Unchanged small volume ascites.  Thought to most likely reflect chronic, posttreatment/postradiation appearance of the endometrium and uterus.  No interval change or other suspicious findings to suggest recurrence or residual disease.  Interval History: The patient was admitted in July with small bowel obstruction with transition point in the right lower quadrant at the level of her prior anastomosis.  She aspirated during one of her episodes of emesis.  She was ultimately treated for acute respiratory failure secondary to aspiration pneumonia.  Her small bowel obstruction was managed conservatively.  Today, patient notes doing well.  She has been off oxygen and breathing is back to baseline.  Saw her primary care provider earlier this week.  Bowels are moving normally.  Has occasional cramping related to bowel function, denies abdominal or pelvic pain otherwise.  Denies any vaginal bleeding or discharge.  Needs to have urinary incontinence.  Has dizziness with position changes and when she throws up.  Past Medical/Surgical History: Past Medical History:  Diagnosis Date   Aspiration pneumonitis (HCC)    in hosp for SBO 08/2023   Atrial fibrillation (HCC)    persistent (outpt monitoring 08/2022)   Bilateral lower extremity edema    Carpal tunnel syndrome    bilateral, had surgery on right hand   Chronic renal insufficiency, stage 3 (moderate) (HCC)    GFR 50s   Diabetes  mellitus without complication (HCC)    Dr. Sharl Ma   Diverticulosis    +hx of 'itis   Endometrial cancer [facility])    RT spring 2024   GERD (gastroesophageal reflux disease)    Gout    No problems after getting on allopurinol   Heart murmur    soft, systolic   History of blood transfusion    History of colon polyps    History of iron deficiency    Hyperlipidemia    Hypertension    Osteoarthritis, multiple sites    esp knees   SBO (small bowel obstruction) (HCC)    adhesions (admission 07/2023)   Urinary incontinence     Past Surgical History:  Procedure Laterality Date   CARPAL TUNNEL RELEASE Bilateral    R 1991.  L 2024   COLON SURGERY     COLONOSCOPY     x2.  Last was 2018   COLOSTOMY     COLOSTOMY REVERSAL     DILATATION & CURETTAGE/HYSTEROSCOPY WITH MYOSURE N/A 07/17/2019   Procedure: DILATATION & CURETTAGE/HYSTEROSCOPY WITH MYOSURE;  Surgeon: Candice Camp, MD;  Location: [facility];  Service: Gynecology;  Laterality: N/A;   OOPHORECTOMY  2000   unilateral   PILONIDAL CYST EXCISION     ROBOTIC ASSISTED TOTAL HYSTERECTOMY WITH BILATERAL SALPINGO OOPHERECTOMY N/A 08/20/2019   Procedure: DIAGNSOTIC LAPAROSCOPY, DILATION AND CURETTAGE, IUD PLACEMENT ;  Surgeon: Adolphus Birchwood, MD;  Location: [facility];  Service: Gynecology;  Laterality: N/A;   SKIN CANCER EXCISION     TRANSTHORACIC ECHOCARDIOGRAM     08/2022 EF 50-55%, inc PA pressure, nl RV fxn, valves ok    Family History  Problem Relation Age of Onset   Hypertension Mother    Skin cancer Father    Diabetes Sister    Cancer Sister    Hypertension Sister    Cancer Sister    Diabetes Sister    Hearing loss Sister    Hypertension Sister    Kidney disease Sister    Cancer Sister    Diabetes Brother    Diabetes Maternal Grandfather    Stroke Maternal Grandfather    Diabetes Paternal Grandmother    Hearing loss Daughter     Social History   Socioeconomic History   Marital status: Married    Spouse name:  Not on file   Number of children: Not on file   Years of education: Not on file   Highest education level: Bachelor's degree (e.g., BA, AB, BS)  Occupational History   Not on file  Tobacco Use   Smoking status: Former    Current packs/day: 0.00    Average packs/day: 3.0 packs/day for 22.0 years (66.0 ttl pk-yrs)    Types: Cigarettes    Start date: 06/07/1965    Quit date: 06/08/1987    Years since quitting: 36.2   Smokeless tobacco: Never  Vaping Use   Vaping status: Never Used  Substance and Sexual Activity   Alcohol use: Yes    Comment: twice  per week   Drug use: No   Sexual activity: Yes    Partners: Male    Birth control/protection: Post-menopausal    Comment: married  Other Topics Concern   Not on file  Social History Narrative   Married, 2 children, 2 grandchildren.   Originally from [place].   Education: Barista.   Occupation: Retired Airline pilot.   History of smoking.   Social Determinants of Health   Financial Resource Strain: Low Risk  (09/10/2023)   Overall Financial Resource Strain (CARDIA)    Difficulty of Paying Living Expenses: Not very hard  Food Insecurity: No Food Insecurity (09/10/2023)   Hunger Vital Sign    Worried About Running Out of Food in the Last Year: Never true    Ran Out of Food in the Last Year: Never true  Transportation Needs: No Transportation Needs (09/10/2023)   PRAPARE - Administrator, Civil Service (Medical): No    Lack of Transportation (Non-Medical): No  Physical Activity: Inactive (09/10/2023)   Exercise Vital Sign    Days of Exercise per Week: 0 days    Minutes of Exercise per Session: 0 min  Stress: No Stress Concern Present (09/10/2023)   [employer] of Occupational Health - Occupational Stress Questionnaire    Feeling of Stress : Only a little  Social Connections: Moderately Integrated (09/10/2023)   Social Connection and Isolation Panel [NHANES]    Frequency of Communication with Friends and  Family: Three times a week    Frequency of Social Gatherings with Friends and Family: Once a week    Attends Religious Services: Never    Database administrator or Organizations: Yes    Attends Engineer, structural: More than 4 times per year    Marital Status: Married    Current Medications:  Current Outpatient Medications:    acetaminophen (TYLENOL) 500 MG tablet, Take 1,000 mg by mouth every 6 (six) hours as needed for moderate pain., Disp: , Rfl:    allopurinol (ZYLOPRIM) 300 MG tablet, Take 1 tablet (300 mg total) by mouth daily., Disp: 90 tablet, Rfl: 1   amLODipine (NORVASC) 5 MG tablet, Take 1 tablet (5 mg total) by mouth daily., Disp: 90 tablet, Rfl: 1   apixaban (ELIQUIS) 5 MG TABS tablet, Take 1 tablet (5 mg total) by mouth 2 (two) times daily., Disp: 180 tablet, Rfl: 3   atorvastatin (LIPITOR) 80 MG tablet, Take 1 tablet (80 mg total) by mouth at bedtime., Disp: 90 tablet, Rfl: 1   carvedilol (COREG) 12.5 MG tablet, Take 1 tablet (12.5 mg total) by mouth 2 (two) times daily with a meal., Disp: 180 tablet, Rfl: 3   Cholecalciferol (VITAMIN D3 PO), Take 1 capsule by mouth daily., Disp: , Rfl:    Coenzyme Q10 (COQ10) 100 MG CAPS, Take 100 mg by mouth daily., Disp: , Rfl:    famotidine (PEPCID) 20 MG tablet, Take 20 mg by mouth daily. , Disp: , Rfl:    furosemide (LASIX) 40 MG tablet, Take 1 tablet in the morning., Disp: 90 tablet, Rfl: 1   GLUCOSAMINE-CHONDROITIN PO, Take 1 tablet by mouth 2 (two) times daily., Disp: , Rfl:    insulin NPH-regular Human (70-30) 100 UNIT/ML injection, Inject 50-70 Units into the skin See admin instructions. 70 units In the morning and 50 units in the evening, Disp: , Rfl:    KRILL OIL PO, Take 1 capsule by mouth daily., Disp: , Rfl:    losartan (COZAAR) 50  MG tablet, Take 1 tablet (50 mg total) by mouth daily., Disp: 30 tablet, Rfl: 0   meclizine (ANTIVERT) 12.5 MG tablet, Take 1 tablet (12.5 mg total) by mouth 3 (three) times daily as needed  for dizziness., Disp: 30 tablet, Rfl: 0   MELATONIN PO, Take 10 mg by mouth at bedtime., Disp: , Rfl:    Multiple Vitamins-Minerals (MULTIVITAMIN WITH MINERALS) tablet, Take 1 tablet by mouth daily., Disp: , Rfl:    potassium chloride (KLOR-CON) 10 MEQ tablet, Take 1 tablet (10 mEq total) by mouth 2 (two) times daily. (Patient taking differently: Take 10 mEq by mouth daily.), Disp: 180 tablet, Rfl: 3  Review of Systems: + incontinence, dizziness Denies appetite changes, fevers, chills, fatigue, unexplained weight changes. Denies hearing loss, neck lumps or masses, mouth sores, ringing in ears or voice changes. Denies cough or wheezing.  Denies shortness of breath. Denies chest pain or palpitations. Denies leg swelling. Denies abdominal distention, pain, blood in stools, constipation, diarrhea, nausea, vomiting, or early satiety. Denies pain with intercourse, dysuria, frequency, hematuria. Denies hot flashes, pelvic pain, vaginal bleeding or vaginal discharge.   Denies joint pain, back pain or muscle pain/cramps. Denies itching, rash, or wounds. Denies headaches, numbness or seizures. Denies swollen lymph nodes or glands, denies easy bruising or bleeding. Denies anxiety, depression, confusion, or decreased concentration.  Physical Exam: BP 132/83 (BP Location: Right Arm, Patient Position: Sitting)   Pulse 68   Temp 98.6 F (37 C) (Oral)   Resp 16   Wt (!) 308 lb (139.7 kg)   SpO2 96%   BMI 52.87 kg/m  General: Alert, oriented, no acute distress. HEENT: Normocephalic, atraumatic, sclera anicteric. Chest: Somewhat limited by body habitus but clear to auscultation bilaterally.   Cardiovascular: regular rate, regular rhythm, no murmurs.  Abdomen: Obese, soft, nontender.  Normoactive bowel sounds.  No masses or hepatosplenomegaly appreciated. Extremities: Somewhat limited range of motion.  Warm, well perfused.  1-2+ LE edema. Lymphatics: No cervical, supraclavicular, or inguinal  adenopathy. GU: Normal appearing external genitalia without erythema, excoriation, or lesions.  Speculum exam reveals moderately atrophic vaginal mucosa.  Cervix is somewhat difficult to visualize and rather flush with the vaginal apex, atrophic.  No bleeding or discharge.  Bimanual exam reveals no masses or nodularity, cervix flush with the vagina.  Uterine assessment is limited by body habitus.  Rectovaginal exam confirms findings, no parametrial nodularity.  Laboratory & Radiologic Studies: MRI pelvis 08/21/23: 1. Essentially stable exam. No evidence of local recurrence or metastatic disease within the pelvis. 2. Stable appearance of the uterus with loss of endometrial/myometrial interface. This may be secondary to posttreatment changes. 3. Stable 4.2 cm nonaggressive cystic structure in the left adnexa, possibly a senescent ovarian cyst versus  ara ovarian/paratubal cyst. 4. Multiple other nonacute observations (such as cholelithiasis without acute cholecystitis, mild splenomegaly, sigmoid diverticulosis, etc.), As described above.  Assessment & Plan: [name] is a 73 y.o. woman with clinical stage I grade 1 endometrioid endometrial cancer, non-operative candidate s/p D&C and IUD (progestin releasing) on 08/20/19. Complete clinical response to progestin therapy with negative repeat sampling in December, 2020. Developed recurrence based on biopsy in 02/2021. She was started on Megace which was increased in 05/2022 when biopsy showed FIGO grade 2 endometrial cancer. Given persistent malignancy on biopsy, IUD removed 3/023 and patient treated with definitive RT completed in 03/2022.    Patient continues to do well without symptoms.  She is NED on exam although her exam is limited by prior radiation  and body habitus. We reviewed recent MRI findings, no significant change noted from to prior studies.  We will plan on repeat MRI in 4-6 months given inability to follow her clinically with exam.    We  will continue with surveillance visits every 3 months.  We discussed signs and symptoms that would be concerning for cancer recurrence, and I stressed the importance of calling if she develops any of these.   20 minutes of total time was spent for this patient encounter, including preparation, face-to-face counseling with the patient and coordination of care, and documentation of the encounter.  Eugene Garnet, MD  Division of Gynecologic Oncology  Department of Obstetrics and Gynecology  [facility]

## 2023-09-13 ENCOUNTER — Inpatient Hospital Stay: Payer: Medicare Other | Attending: Gynecologic Oncology | Admitting: Gynecologic Oncology

## 2023-09-13 ENCOUNTER — Telehealth: Payer: Self-pay | Admitting: *Deleted

## 2023-09-13 ENCOUNTER — Encounter: Payer: Self-pay | Admitting: Gynecologic Oncology

## 2023-09-13 VITALS — BP 132/83 | HR 68 | Temp 98.6°F | Resp 16 | Wt 308.0 lb

## 2023-09-13 DIAGNOSIS — Z90722 Acquired absence of ovaries, bilateral: Secondary | ICD-10-CM | POA: Diagnosis not present

## 2023-09-13 DIAGNOSIS — N736 Female pelvic peritoneal adhesions (postinfective): Secondary | ICD-10-CM | POA: Insufficient documentation

## 2023-09-13 DIAGNOSIS — Z8542 Personal history of malignant neoplasm of other parts of uterus: Secondary | ICD-10-CM | POA: Insufficient documentation

## 2023-09-13 DIAGNOSIS — Z923 Personal history of irradiation: Secondary | ICD-10-CM | POA: Insufficient documentation

## 2023-09-13 DIAGNOSIS — Z90710 Acquired absence of both cervix and uterus: Secondary | ICD-10-CM | POA: Diagnosis not present

## 2023-09-13 DIAGNOSIS — C541 Malignant neoplasm of endometrium: Secondary | ICD-10-CM

## 2023-09-13 NOTE — Telephone Encounter (Signed)
Spoke with [name] and notified her of scheduled MRI at [facility] on Monday, February 10 at 1230. Pt agreed to date and time and thanked the office for calling.

## 2023-09-13 NOTE — Patient Instructions (Signed)
It was good to see you today.  I do not see or feel any evidence of cancer recurrence on your exam.  I will see you for follow-up in 3 months.  As always, if you develop any new and concerning symptoms before your next visit, please call to see me sooner.

## 2023-09-18 DIAGNOSIS — E785 Hyperlipidemia, unspecified: Secondary | ICD-10-CM | POA: Diagnosis not present

## 2023-09-18 DIAGNOSIS — I110 Hypertensive heart disease with heart failure: Secondary | ICD-10-CM | POA: Diagnosis not present

## 2023-09-18 DIAGNOSIS — I480 Paroxysmal atrial fibrillation: Secondary | ICD-10-CM | POA: Diagnosis not present

## 2023-09-18 DIAGNOSIS — J690 Pneumonitis due to inhalation of food and vomit: Secondary | ICD-10-CM | POA: Diagnosis not present

## 2023-09-18 DIAGNOSIS — I5033 Acute on chronic diastolic (congestive) heart failure: Secondary | ICD-10-CM | POA: Diagnosis not present

## 2023-09-18 DIAGNOSIS — J9601 Acute respiratory failure with hypoxia: Secondary | ICD-10-CM | POA: Diagnosis not present

## 2023-09-30 ENCOUNTER — Ambulatory Visit (INDEPENDENT_AMBULATORY_CARE_PROVIDER_SITE_OTHER): Payer: Medicare Other | Admitting: Podiatry

## 2023-09-30 ENCOUNTER — Encounter: Payer: Self-pay | Admitting: Podiatry

## 2023-09-30 DIAGNOSIS — M79674 Pain in right toe(s): Secondary | ICD-10-CM | POA: Diagnosis not present

## 2023-09-30 DIAGNOSIS — M79675 Pain in left toe(s): Secondary | ICD-10-CM

## 2023-09-30 DIAGNOSIS — B351 Tinea unguium: Secondary | ICD-10-CM | POA: Diagnosis not present

## 2023-09-30 DIAGNOSIS — E118 Type 2 diabetes mellitus with unspecified complications: Secondary | ICD-10-CM | POA: Diagnosis not present

## 2023-09-30 NOTE — Progress Notes (Signed)
This patient returns to my office for at risk foot care.  This patient requires this care by a professional since this patient will be at risk due to having diabetes.  She is wearing bandages on her lower legs.  This patient is unable to cut nails herself since the patient cannot reach her nails.These nails are painful walking and wearing shoes.  This patient presents for at risk foot care today.  General Appearance  Alert, conversant and in no acute stress.  Vascular  Dorsalis pedis and posterior tibial  pulses are palpable  bilaterally.  Capillary return is within normal limits  bilaterally. Temperature is within normal limits  bilaterally.  Neurologic  Senn-Weinstein monofilament wire test within normal limits  bilaterally. Muscle power within normal limits bilaterally.  Nails Thick disfigured discolored nails with subungual debris  from hallux to fifth toes bilaterally. No evidence of bacterial infection or drainage bilaterally.  Orthopedic  No limitations of motion  feet .  No crepitus or effusions noted.  No bony pathology or digital deformities noted.  Skin  normotropic skin with no porokeratosis noted bilaterally.  No signs of infections or ulcers noted.     Onychomycosis  Pain in right toes  Pain in left toes  Consent was obtained for treatment procedures.   Mechanical debridement of nails 1-5  bilaterally performed with a nail nipper.  Filed with dremel without incident.    Return office visit      10   weeks              Told patient to return for periodic foot care and evaluation due to potential at risk complications.   Helane Gunther DPM

## 2023-10-03 ENCOUNTER — Other Ambulatory Visit (HOSPITAL_COMMUNITY): Payer: Self-pay

## 2023-10-07 ENCOUNTER — Other Ambulatory Visit: Payer: Self-pay

## 2023-10-07 MED ORDER — LOSARTAN POTASSIUM 50 MG PO TABS: 50.0000 mg | ORAL_TABLET | Freq: Every day | ORAL | 0 refills | Status: DC

## 2023-11-08 ENCOUNTER — Other Ambulatory Visit: Payer: Self-pay | Admitting: Internal Medicine

## 2023-11-08 DIAGNOSIS — I10 Essential (primary) hypertension: Secondary | ICD-10-CM

## 2023-11-08 MED ORDER — CARVEDILOL 12.5 MG PO TABS: 12.5000 mg | ORAL_TABLET | Freq: Two times a day (BID) | ORAL | 0 refills | Status: DC

## 2023-11-08 NOTE — Telephone Encounter (Signed)
Prescription refill request for Eliquis received. Indication:AFIB Last office visit:UPCOMING Scr:0.90  8/24 Age: 73 Weight:139.7  KG  Prescription refilled

## 2023-11-10 NOTE — Progress Notes (Unsigned)
Cardiology Office Note   Date:  11/12/2023   ID:  [name], DOB 1950-06-14, MRN [id number]  PCP:  Jeoffrey Massed, MD  Cardiologist:   Dietrich Pates, MD   PT presents for follow up of HTN     History of Present Illness: [name] is a 73 y.o. female with a history of T2DM (dx age 32), endometrial CA, HL, HTN, atrial fibrillation and gout In [place] echo 2023 :  LVEF 60%, mild aortic insuff, mild LVH March 2023:  Normal perfusion  LVEF normal     I saw the pt in Sept 2023  Echo after showed LVEF normal  IVC dilated   Lasix dose increased after this  Monitor showed average HR 98 bpm  Afib  Recomm she increase carvedilol to 12.5 mg bid  Since seen she denies CP   Breathing is stable   She is not active     Denies palpitations     Still with edema, better than previous   Current Meds  Medication Sig   acetaminophen (TYLENOL) 500 MG tablet Take 1,000 mg by mouth every 6 (six) hours as needed for moderate pain.   allopurinol (ZYLOPRIM) 300 MG tablet Take 1 tablet (300 mg total) by mouth daily.   amLODipine (NORVASC) 5 MG tablet Take 1 tablet (5 mg total) by mouth daily.   atorvastatin (LIPITOR) 80 MG tablet Take 1 tablet (80 mg total) by mouth at bedtime.   carvedilol (COREG) 12.5 MG tablet Take 1 tablet (12.5 mg total) by mouth 2 (two) times daily with a meal.   Cholecalciferol (VITAMIN D3 PO) Take 1 capsule by mouth daily.   Coenzyme Q10 (COQ10) 100 MG CAPS Take 100 mg by mouth daily.   ELIQUIS 5 MG TABS tablet TAKE 1 TABLET BY MOUTH TWICE A DAY   famotidine (PEPCID) 20 MG tablet Take 20 mg by mouth daily.    furosemide (LASIX) 40 MG tablet Take 1 tablet in the morning.   GLUCOSAMINE-CHONDROITIN PO Take 1 tablet by mouth 2 (two) times daily.   insulin NPH-regular Human (70-30) 100 UNIT/ML injection Inject 50-70 Units into the skin See admin instructions. 70 units In the morning and 50 units in the evening   KRILL OIL PO Take 1 capsule by mouth daily.   losartan (COZAAR) 50 MG  tablet Take 1 tablet (50 mg total) by mouth daily.   meclizine (ANTIVERT) 12.5 MG tablet Take 1 tablet (12.5 mg total) by mouth 3 (three) times daily as needed for dizziness.   MELATONIN PO Take 10 mg by mouth at bedtime.   Multiple Vitamins-Minerals (MULTIVITAMIN WITH MINERALS) tablet Take 1 tablet by mouth daily.   potassium chloride (KLOR-CON) 10 MEQ tablet Take 1 tablet (10 mEq total) by mouth 2 (two) times daily. (Patient taking differently: Take 10 mEq by mouth daily.)     Allergies:   Lisinopril, Actos [pioglitazone], Metformin, and Metformin and related   Past Medical History:  Diagnosis Date   Aspiration pneumonitis (HCC)    in hosp for SBO 08/2023   Atrial fibrillation (HCC)    persistent (outpt monitoring 08/2022)   Bilateral lower extremity edema    Carpal tunnel syndrome    bilateral, had surgery on right hand   Chronic renal insufficiency, stage 3 (moderate) (HCC)    GFR 50s   Diabetes mellitus without complication (HCC)    Dr. Sharl Ma   Diverticulosis    +hx of 'itis   Endometrial cancer [facility])    RT spring  2024   GERD (gastroesophageal reflux disease)    Gout    No problems after getting on allopurinol   Heart murmur    soft, systolic   History of blood transfusion    History of colon polyps    History of iron deficiency    Hyperlipidemia    Hypertension    Osteoarthritis, multiple sites    esp knees   SBO (small bowel obstruction) (HCC)    adhesions (admission 07/2023)   Urinary incontinence     Past Surgical History:  Procedure Laterality Date   CARPAL TUNNEL RELEASE Bilateral    R 1991.  L 2024   COLON SURGERY     COLONOSCOPY     x2.  Last was 2018   COLOSTOMY     COLOSTOMY REVERSAL     DILATATION & CURETTAGE/HYSTEROSCOPY WITH MYOSURE N/A 07/17/2019   Procedure: DILATATION & CURETTAGE/HYSTEROSCOPY WITH MYOSURE;  Surgeon: Candice Camp, MD;  Location: [facility];  Service: Gynecology;  Laterality: N/A;   OOPHORECTOMY  2000   unilateral    PILONIDAL CYST EXCISION     ROBOTIC ASSISTED TOTAL HYSTERECTOMY WITH BILATERAL SALPINGO OOPHERECTOMY N/A 08/20/2019   Procedure: DIAGNSOTIC LAPAROSCOPY, DILATION AND CURETTAGE, IUD PLACEMENT ;  Surgeon: Adolphus Birchwood, MD;  Location: [facility];  Service: Gynecology;  Laterality: N/A;   SKIN CANCER EXCISION     TRANSTHORACIC ECHOCARDIOGRAM     08/2022 EF 50-55%, inc PA pressure, nl RV fxn, valves ok     Social History:  The patient  reports that she quit smoking about 36 years ago. Her smoking use included cigarettes. She started smoking about 58 years ago. She has a 66 pack-year smoking history. She has never used smokeless tobacco. She reports current alcohol use. She reports that she does not use drugs.   Family History:  [name] family history includes Cancer in her sister, sister, and sister; Diabetes in her brother, maternal grandfather, paternal grandmother, sister, and sister; Hearing loss in her daughter and sister; Hypertension in her mother, sister, and sister; Kidney disease in her sister; Skin cancer in her father; Stroke in her maternal grandfather.    ROS:  Please see the history of present illness. All other systems are reviewed and  Negative to the above problem except as noted.    PHYSICAL EXAM: VS:  BP 130/80 (BP Location: Left Arm, Patient Position: Sitting, Cuff Size: Large)   Pulse 82   Resp 12   Ht 5\' 4"  (1.626 m)   Wt (!) 309 lb 3.2 oz (140.3 kg)   SpO2 97%   BMI 53.07 kg/m   GEN: Morbidly obese 73 yo in NAD  Examined in chair HEENT: normal  Neck: no JVD, no carotid bruit Cardiac: RRR; no murmur   1 + edema  Respiratory:  clear to auscultation bilaterally GI: soft, nontender, obese    EKG:  EKG is ordered today.  Atrial fibrillaton  RBBB 75 bpm    Monitor  Sept 2023  Impression:   Rhythm:   Atrial fibrillation   Rates 45 to 174 bpm  Average HR 98 bpm   Rare PVC, PAC.   3 episodes of VT, longest lasting 11 beats with maximal rate 154 bpm    Echo  2023   1. Left ventricular ejection fraction, by estimation, is 50 to 55% with  wide variability beat to beat. The left ventricle has low normal function.  The left ventricle has no regional wall motion abnormalities. Left  ventricular diastolic  parameters are  indeterminate.   2. Right ventricular systolic function is normal. The right ventricular  size is normal. There is moderately elevated pulmonary artery systolic  pressure. The estimated right ventricular systolic pressure is 50.0 mmHg.   3. Left atrial size was mildly dilated.   4. The mitral valve is normal in structure. Trivial mitral valve  regurgitation. No evidence of mitral stenosis.   5. The aortic valve is grossly normal. There is mild calcification of the  aortic valve. Aortic valve regurgitation is trivial. No aortic stenosis is  present.   6. The inferior vena cava is dilated in size with <50% respiratory  variability, suggesting right atrial pressure of 15 mmHg.    Lipid Panel    Component Value Date/Time   CHOL 187 07/23/2022 0000   TRIG 177 (A) 07/23/2022 0000   HDL 31 (A) 07/23/2022 0000   LDLCALC 124 07/23/2022 0000      Wt Readings from Last 3 Encounters:  11/12/23 (!) 309 lb 3.2 oz (140.3 kg)  09/13/23 (!) 308 lb (139.7 kg)  09/11/23 (!) 307 lb (139.3 kg)      ASSESSMENT AND PLAN:  1  Atrial fibrillation  Pt remains in afib  Rates controlled Continue Eliquis            2  HTN  BP controlled   Follow   3 Dyspnea.   Breathing is stable   Volume is up some but better than previous  Follow   4  HL   LDL 56  HDL 39 in July 2024     3 DM   A1C 5.9  in Aug 2024    Current medicines are reviewed at length with the patient today.  The patient does not have concerns regarding medicines.  Signed, Dietrich Pates, MD  11/12/2023 11:19 PM    [facility] [address] [place], [place], [place]  [address] Phone: [phone]; Fax: [phone]

## 2023-11-12 ENCOUNTER — Encounter: Payer: Self-pay | Admitting: Family Medicine

## 2023-11-12 ENCOUNTER — Encounter: Payer: Self-pay | Admitting: Internal Medicine

## 2023-11-12 ENCOUNTER — Ambulatory Visit: Payer: Medicare Other | Attending: Internal Medicine | Admitting: Internal Medicine

## 2023-11-12 VITALS — BP 130/80 | HR 82 | Resp 12 | Ht 64.0 in | Wt 309.2 lb

## 2023-11-12 DIAGNOSIS — R42 Dizziness and giddiness: Secondary | ICD-10-CM

## 2023-11-12 DIAGNOSIS — I4891 Unspecified atrial fibrillation: Secondary | ICD-10-CM

## 2023-11-12 NOTE — Patient Instructions (Signed)
Medication Instructions:   *If you need a refill on your cardiac medications before your next appointment, please call your pharmacy*   Lab Work:  If you have labs (blood work) drawn today and your tests are completely normal, you will receive your results only by: MyChart Message (if you have MyChart) OR A paper copy in the mail If you have any lab test that is abnormal or we need to change your treatment, we will call you to review the results.   Testing/Procedures:    Follow-Up: At [facility], you and your health needs are our priority.  As part of our continuing mission to provide you with exceptional heart care, we have created designated Provider Care Teams.  These Care Teams include your primary Cardiologist (physician) and Advanced Practice Providers (APPs -  Physician Assistants and Nurse Practitioners) who all work together to provide you with the care you need, when you need it.  We recommend signing up for the patient portal called "MyChart".  Sign up information is provided on this After Visit Summary.  MyChart is used to connect with patients for Virtual Visits (Telemedicine).  Patients are able to view lab/test results, encounter notes, upcoming appointments, etc.  Non-urgent messages can be sent to your provider as well.   To learn more about what you can do with MyChart, go to [email or web address].    [facility]

## 2023-11-13 NOTE — Telephone Encounter (Signed)
Please call the company and give a verbal to discontinue her oxygen. If they need some sort of a written order then they will need to fax me something to sign. Thanks.

## 2023-11-14 NOTE — Telephone Encounter (Signed)
Yes pls do letter

## 2023-11-22 ENCOUNTER — Other Ambulatory Visit: Payer: Self-pay | Admitting: Family Medicine

## 2023-11-22 ENCOUNTER — Other Ambulatory Visit: Payer: Self-pay

## 2023-11-22 DIAGNOSIS — I10 Essential (primary) hypertension: Secondary | ICD-10-CM

## 2023-11-22 MED ORDER — CARVEDILOL 12.5 MG PO TABS: 12.5000 mg | ORAL_TABLET | Freq: Two times a day (BID) | ORAL | 3 refills | Status: DC

## 2023-11-26 DIAGNOSIS — Z124 Encounter for screening for malignant neoplasm of cervix: Secondary | ICD-10-CM | POA: Diagnosis not present

## 2023-11-26 DIAGNOSIS — Z1151 Encounter for screening for human papillomavirus (HPV): Secondary | ICD-10-CM | POA: Diagnosis not present

## 2023-11-26 DIAGNOSIS — Z1231 Encounter for screening mammogram for malignant neoplasm of breast: Secondary | ICD-10-CM | POA: Diagnosis not present

## 2023-11-26 DIAGNOSIS — Z6841 Body Mass Index (BMI) 40.0 and over, adult: Secondary | ICD-10-CM | POA: Diagnosis not present

## 2023-11-30 ENCOUNTER — Encounter: Payer: Self-pay | Admitting: Family Medicine

## 2023-12-09 ENCOUNTER — Encounter: Payer: Self-pay | Admitting: Podiatry

## 2023-12-09 ENCOUNTER — Ambulatory Visit (INDEPENDENT_AMBULATORY_CARE_PROVIDER_SITE_OTHER): Payer: Medicare Other | Admitting: Podiatry

## 2023-12-09 DIAGNOSIS — B351 Tinea unguium: Secondary | ICD-10-CM

## 2023-12-09 DIAGNOSIS — E118 Type 2 diabetes mellitus with unspecified complications: Secondary | ICD-10-CM | POA: Diagnosis not present

## 2023-12-09 DIAGNOSIS — M79674 Pain in right toe(s): Secondary | ICD-10-CM

## 2023-12-09 DIAGNOSIS — M79675 Pain in left toe(s): Secondary | ICD-10-CM | POA: Diagnosis not present

## 2023-12-09 NOTE — Progress Notes (Signed)
This patient returns to my office for at risk foot care.  This patient requires this care by a professional since this patient will be at risk due to having diabetes.  Her right lower leg is draining. This patient is unable to cut nails herself since the patient cannot reach her nails.These nails are painful walking and wearing shoes.  This patient presents for at risk foot care today.  General Appearance  Alert, conversant and in no acute stress.  Vascular  Dorsalis pedis and posterior tibial  pulses are palpable  bilaterally.  Capillary return is within normal limits  bilaterally. Temperature is within normal limits  bilaterally.  Neurologic  Senn-Weinstein monofilament wire test within normal limits  bilaterally. Muscle power within normal limits bilaterally.  Nails Thick disfigured discolored nails with subungual debris  from hallux to fifth toes bilaterally. No evidence of bacterial infection or drainage bilaterally.  Orthopedic  No limitations of motion  feet .  No crepitus or effusions noted.  No bony pathology or digital deformities noted.  Skin  normotropic skin with no porokeratosis noted bilaterally.  No signs of infections or ulcers noted.     Onychomycosis  Pain in right toes  Pain in left toes  Consent was obtained for treatment procedures.   Mechanical debridement of nails 1-5  bilaterally performed with a nail nipper.  Filed with dremel without incident.    Return office visit      12     weeks              Told patient to return for periodic foot care and evaluation due to potential at risk complications.   Helane Gunther DPM

## 2023-12-11 ENCOUNTER — Ambulatory Visit: Payer: Medicare Other | Admitting: Family Medicine

## 2023-12-12 ENCOUNTER — Ambulatory Visit (INDEPENDENT_AMBULATORY_CARE_PROVIDER_SITE_OTHER): Payer: Medicare Other | Admitting: Family Medicine

## 2023-12-12 ENCOUNTER — Encounter: Payer: Self-pay | Admitting: Family Medicine

## 2023-12-12 VITALS — BP 106/70 | HR 63 | Wt 315.0 lb

## 2023-12-12 DIAGNOSIS — D539 Nutritional anemia, unspecified: Secondary | ICD-10-CM | POA: Diagnosis not present

## 2023-12-12 DIAGNOSIS — N1831 Chronic kidney disease, stage 3a: Secondary | ICD-10-CM | POA: Diagnosis not present

## 2023-12-12 DIAGNOSIS — E7800 Pure hypercholesterolemia, unspecified: Secondary | ICD-10-CM

## 2023-12-12 DIAGNOSIS — I5032 Chronic diastolic (congestive) heart failure: Secondary | ICD-10-CM | POA: Diagnosis not present

## 2023-12-12 LAB — BASIC METABOLIC PANEL
BUN: 24 mg/dL — ABNORMAL HIGH (ref 6–23)
CO2: 26 meq/L (ref 19–32)
Calcium: 9 mg/dL (ref 8.4–10.5)
Chloride: 106 meq/L (ref 96–112)
Creatinine, Ser: 0.99 mg/dL (ref 0.40–1.20)
GFR: 55.85 mL/min — ABNORMAL LOW (ref >60.00)
Glucose, Bld: 83 mg/dL (ref 70–99)
Potassium: 4.2 meq/L (ref 3.5–5.1)
Sodium: 143 meq/L (ref 135–145)

## 2023-12-12 NOTE — Progress Notes (Signed)
OFFICE VISIT  12/12/2023  CC:  Chief Complaint  Patient presents with   Medical Management of Chronic Issues    Pt is not fasting.     Patient is a 73 y.o. female who presents for 31-month follow-up chronic diastolic heart failure, chronic renal insufficiency stage III, and hypertension.  INTERIM HX: Feeling well other than chronic positional vertigo lately. She is currently awaiting cardiology's plan to get her to vestibular rehab.  Pt followed by Dr. Sharl Ma for DM, thyroid.  Pt states all has been good with these things.  No SOB, CP, or LE changes. No fevers, no Has, no vision changes.  Past Medical History:  Diagnosis Date   Aspiration pneumonitis (HCC)    in hosp for SBO 08/2023   Atrial fibrillation (HCC)    persistent (outpt monitoring 08/2022)   Bilateral lower extremity edema    Carpal tunnel syndrome    bilateral, had surgery on right hand   Chronic renal insufficiency, stage 3 (moderate) (HCC)    GFR 50s   Diabetes mellitus without complication (HCC)    Dr. Sharl Ma   Diverticulosis    +hx of 'itis   Endometrial cancer [facility])    RT spring 2024   GERD (gastroesophageal reflux disease)    Gout    No problems after getting on allopurinol   Heart murmur    soft, systolic   History of blood transfusion    History of colon polyps    History of iron deficiency    Hyperlipidemia    Hypertension    Osteoarthritis, multiple sites    esp knees   SBO (small bowel obstruction) (HCC)    adhesions (admission 07/2023)   Urinary incontinence     Past Surgical History:  Procedure Laterality Date   CARPAL TUNNEL RELEASE Bilateral    R 1991.  L 2024   COLON SURGERY     COLONOSCOPY     x2.  Last was 2018   COLOSTOMY     COLOSTOMY REVERSAL     DILATATION & CURETTAGE/HYSTEROSCOPY WITH MYOSURE N/A 07/17/2019   Procedure: DILATATION & CURETTAGE/HYSTEROSCOPY WITH MYOSURE;  Surgeon: Candice Camp, MD;  Location: [facility];  Service: Gynecology;  Laterality: N/A;    OOPHORECTOMY  2000   unilateral   PILONIDAL CYST EXCISION     ROBOTIC ASSISTED TOTAL HYSTERECTOMY WITH BILATERAL SALPINGO OOPHERECTOMY N/A 08/20/2019   Procedure: DIAGNSOTIC LAPAROSCOPY, DILATION AND CURETTAGE, IUD PLACEMENT ;  Surgeon: Adolphus Birchwood, MD;  Location: [facility];  Service: Gynecology;  Laterality: N/A;   SKIN CANCER EXCISION     TRANSTHORACIC ECHOCARDIOGRAM     08/2022 EF 50-55%, inc PA pressure, nl RV fxn, valves ok    Outpatient Medications Prior to Visit  Medication Sig Dispense Refill   acetaminophen (TYLENOL) 500 MG tablet Take 1,000 mg by mouth every 6 (six) hours as needed for moderate pain.     allopurinol (ZYLOPRIM) 300 MG tablet Take 1 tablet (300 mg total) by mouth daily. 90 tablet 1   amLODipine (NORVASC) 5 MG tablet Take 1 tablet (5 mg total) by mouth daily. 90 tablet 1   atorvastatin (LIPITOR) 80 MG tablet Take 1 tablet (80 mg total) by mouth at bedtime. 90 tablet 1   carvedilol (COREG) 12.5 MG tablet Take 1 tablet (12.5 mg total) by mouth 2 (two) times daily with a meal. 180 tablet 3   Cholecalciferol (VITAMIN D3 PO) Take 1 capsule by mouth daily.     Coenzyme Q10 (COQ10) 100 MG  CAPS Take 100 mg by mouth daily.     ELIQUIS 5 MG TABS tablet TAKE 1 TABLET BY MOUTH TWICE A DAY 180 tablet 3   famotidine (PEPCID) 20 MG tablet Take 20 mg by mouth daily.      furosemide (LASIX) 40 MG tablet Take 1 tablet in the morning. 90 tablet 1   GLUCOSAMINE-CHONDROITIN PO Take 1 tablet by mouth 2 (two) times daily.     insulin NPH-regular Human (70-30) 100 UNIT/ML injection Inject 50-70 Units into the skin See admin instructions. 70 units In the morning and 50 units in the evening     KRILL OIL PO Take 1 capsule by mouth daily.     losartan (COZAAR) 50 MG tablet Take 1 tablet (50 mg total) by mouth daily. 90 tablet 0   meclizine (ANTIVERT) 12.5 MG tablet Take 1 tablet (12.5 mg total) by mouth 3 (three) times daily as needed for dizziness. 30 tablet 0   MELATONIN PO Take 10 mg by mouth  at bedtime.     Multiple Vitamins-Minerals (MULTIVITAMIN WITH MINERALS) tablet Take 1 tablet by mouth daily.     potassium chloride (KLOR-CON) 10 MEQ tablet Take 1 tablet (10 mEq total) by mouth 2 (two) times daily. (Patient taking differently: Take 10 mEq by mouth daily.) 180 tablet 3   No facility-administered medications prior to visit.    Allergies  Allergen Reactions   Lisinopril Cough    Other reaction(s): cough, Not available   Actos [Pioglitazone]     Unknown reaction   Metformin Other (See Comments)   Metformin And Related     Gas     Review of Systems As per HPI  PE:    12/12/2023   10:02 AM 11/12/2023   11:27 AM 09/13/2023    1:19 PM  Vitals with BMI  Height  5\' 4"    Weight 315 lbs 309 lbs 3 oz 308 lbs  BMI  53.05   Systolic 106 130 161  Diastolic 70 80 83  Pulse 63 82 68     Physical Exam  Gen: Alert, well appearing.  Patient is oriented to person, place, time, and situation. AFFECT: pleasant, lucid thought and speech. CV: irreg irreg, rate 60s, soft systolic murmur EXT: general lymphedema skin changes.  No pitting edema.  LABS:  Last CBC Lab Results  Component Value Date   WBC 5.5 08/14/2023   HGB 11.5 (L) 08/14/2023   HCT 35.7 (L) 08/14/2023   MCV 100.5 (H) 08/14/2023   MCH 32.3 08/04/2023   RDW 15.7 (H) 08/14/2023   PLT 350.0 08/14/2023   Lab Results  Component Value Date   FERRITIN 19.9 07/23/2022   Last metabolic panel Lab Results  Component Value Date   GLUCOSE 153 (H) 08/20/2023   NA 141 08/20/2023   K 4.7 08/20/2023   CL 107 08/20/2023   CO2 27 08/20/2023   BUN 17 08/20/2023   CREATININE 0.90 08/20/2023   GFR 62.76 08/20/2023   CALCIUM 9.2 08/20/2023   PROT 6.1 08/14/2023   ALBUMIN 3.7 08/14/2023   BILITOT 0.5 08/14/2023   ALKPHOS 111 08/14/2023   AST 16 08/14/2023   ALT 21 08/14/2023   ANIONGAP 12 08/06/2023   Last lipids Lab Results  Component Value Date   CHOL 187 07/23/2022   HDL 31 (A) 07/23/2022   LDLCALC  124 07/23/2022   TRIG 177 (A) 07/23/2022   Last hemoglobin A1c Lab Results  Component Value Date   HGBA1C 5.9 (H) 08/01/2023  Last thyroid functions Lab Results  Component Value Date   TSH 6.020 (H) 09/10/2022   IMPRESSION AND PLAN:  1) Chronic diastolic HF, asymptomatic but activity very limited by her osteoarthritis.  2) CRI III. Avoid NSAIDs. Lytes/cr today.  3) HTN, stable on amlod 5mg  every day, coreg 12.5 bid, and losartan 50 every day. Lytes/cr today.  4) BPPV, cardiology arranging vestib rehab.  5) DM per Dr. Sharl Ma.  An After Visit Summary was printed and given to the patient.  FOLLOW UP: 3 mo RCI  Signed:  Santiago Bumpers, MD           12/12/2023

## 2023-12-23 ENCOUNTER — Encounter: Payer: Self-pay | Admitting: Gynecologic Oncology

## 2023-12-26 ENCOUNTER — Telehealth: Payer: Self-pay | Admitting: Family Medicine

## 2023-12-26 ENCOUNTER — Ambulatory Visit: Payer: Medicare Other | Admitting: Gynecologic Oncology

## 2023-12-26 NOTE — Telephone Encounter (Signed)
[employer] faxed  Physician Order , to be filled out by provider. Patient requested to send it back via Fax within 7-days. Document is located in providers tray at front office.Please advise at Mobile [phone] (mobile)

## 2023-12-26 NOTE — Telephone Encounter (Signed)
Form received  and placed on Dr. Verdis Frederickson desk

## 2023-12-27 ENCOUNTER — Inpatient Hospital Stay: Payer: Medicare Other | Attending: Gynecologic Oncology | Admitting: Gynecologic Oncology

## 2023-12-27 ENCOUNTER — Encounter: Payer: Self-pay | Admitting: Gynecologic Oncology

## 2023-12-27 VITALS — BP 148/72 | HR 86 | Temp 98.4°F | Resp 18 | Ht 64.0 in | Wt 320.0 lb

## 2023-12-27 DIAGNOSIS — Z923 Personal history of irradiation: Secondary | ICD-10-CM | POA: Diagnosis not present

## 2023-12-27 DIAGNOSIS — Z8049 Family history of malignant neoplasm of other genital organs: Secondary | ICD-10-CM

## 2023-12-27 DIAGNOSIS — Z8542 Personal history of malignant neoplasm of other parts of uterus: Secondary | ICD-10-CM | POA: Diagnosis not present

## 2023-12-27 DIAGNOSIS — R32 Unspecified urinary incontinence: Secondary | ICD-10-CM | POA: Diagnosis not present

## 2023-12-27 DIAGNOSIS — Z808 Family history of malignant neoplasm of other organs or systems: Secondary | ICD-10-CM

## 2023-12-27 DIAGNOSIS — Z6841 Body Mass Index (BMI) 40.0 and over, adult: Secondary | ICD-10-CM | POA: Diagnosis not present

## 2023-12-27 DIAGNOSIS — C541 Malignant neoplasm of endometrium: Secondary | ICD-10-CM

## 2023-12-27 DIAGNOSIS — N3941 Urge incontinence: Secondary | ICD-10-CM

## 2023-12-27 DIAGNOSIS — Z803 Family history of malignant neoplasm of breast: Secondary | ICD-10-CM

## 2023-12-27 DIAGNOSIS — E119 Type 2 diabetes mellitus without complications: Secondary | ICD-10-CM | POA: Diagnosis not present

## 2023-12-27 DIAGNOSIS — Z87891 Personal history of nicotine dependence: Secondary | ICD-10-CM

## 2023-12-27 NOTE — Patient Instructions (Signed)
It was good to see you today.  I do not see or feel any evidence of cancer recurrence on your exam.  I will see you for follow-up in 3 months.  As always, if you develop any new and concerning symptoms before your next visit, please call to see me sooner.  

## 2023-12-27 NOTE — Progress Notes (Signed)
[name], [name], [name] 5.5 cm with an endometrial thickness of 1.4 cm.  The ovaries were not visualized.   She was taken to the operating room for a curettage and polypectomy on July 17, 2019 which revealed FIGO grade 1 endometrioid adenocarcinoma.   Patient has a complex medical history.  She is morbidly obese with a BMI of 48 kg/m.  She experienced ruptured diverticulitis in 1997 which required a diverting colostomy and Hartman's pouch.  She 6 weeks later she was taken to the operating room for laparotomy and reversal of Hartman's.  She reports that the surgeon described her abdomen is looking like a gallon of superglue had been poured inside her.  It was an extensively long surgery.  She is unsure if additional bowel resections were necessary.  She had a 1 month hospitalization following this reversal of colostomy.  She developed a bowel obstruction during that hospitalization and required reoperation to resolve this.   In 2000 she developed a growth on an ovary (she cannot remember which side).  For this she underwent laparotomy and oophorectomy.  This surgery was performed in [place].  She was planning to have a hysterectomy at that procedure, however the surgeon aborted plans for hysterectomy and performed only oophorectomy after encountering significant adhesive disease.  She states that he told her surgery would have needed to the last 6 to 8 hours in order to perform hysterectomy due to the adhesive disease.   The patient has type 2 diabetes mellitus and has had this for greater than 30 years.  She reports having no  endorgan disease.  Her hemoglobin A1c was 5.4% in May 2020.   Her family history is significant for a father with skin cancer.  She has 3 sisters with cancer including one with endometrial cancer who is obese, a sister with stage II 3 breast cancer, and multiple myeloma.   She has had 2 prior vaginal deliveries.   She lives in [place].   On August 20, 2019 she underwent an attempt at a robotic assisted total hysterectomy BSO and sentinel lymph node biopsy.  The surgery was aborted after laparoscopic entry into the left upper quadrant encountered dense adhesions throughout the entirety of the abdomen which included dense colonic and small bowel adhesions to the anterior abdominal wall.  The density of these adhesions and their comprehensive nature across the entirety of her abdominal wall meant that it was not possible to place additional laparoscopic ports to perform adhesiolysis.  She would have required a laparotomy in order to achieve hysterectomy, and the patient had declined this preoperatively, and had elected to proceed with D&C with progestin releasing IUD placement if hysterectomy was not possible via minimally invasive surgical route.  Therefore we aborted the laparoscopic procedure and performed a D&C procedure with placement of a progestin releasing IUD.   Final pathology from the [facility] specimen showed adenocarcinoma favor endometrial primary.  It was difficult to grade the specimen due to his fragmented nature.   Repeat biopsy (office) sampling in December, 2020 showed complete clinical response with no residual carcinoma.    The patient then transferred care to  Dr. Donnamarie Rossetti in [place].  Repeat endometrial biopsy on 3/[name]/2022 showed residual endometrioid adenocarcinoma.   Repeat biopsy on 06/22/2021 showed FIGO grade 2 endometrioid adenocarcinoma.  There is a comment that her Megace, which must of been started prior to this, was increased to 160 mg twice daily.   Endometrial biopsy on  09/22/2021 showed residual endometrioid adenocarcinoma, FIGO grade 1-2, in the background of endometrium with exogenous hormone effect.   Pelvic MRI on 12/05/2021 noted a 3.3 cm simple left ovarian cyst, colonic diverticulosis, anterior endometrial mass in the uterine body with an IUD adjacent, suspicious for endometrial cancer.   Repeat endometrial biopsy on 02/01/2022 showed residual endometrial carcinoma, FIGO grade 1.   Staging CT of the chest, abdomen, and pelvis on 02/15/2022 revealed no adenopathy, no aggressive appearing osseous lesions.  Diastases recti with superimposed shallow ventral wall hernia measuring 1 [name] 7 cm.  Cholelithiasis noted without acute cholecystitis.   03/27/22: IUD removed.   Patient proceeded with definitive radiation therapy from 03/06/2022 - 04/18/2022.  She was treated with EBRT (4500 cGy in 25 fractions) utilizing IMRT treatment technique.  This was followed by weekly T&O HDR treatment to a total dose of 3000 cGy in 5 fractions.  Total treatment dose was 7500 cGy.  Treatment was tolerated well other than fatigue and mild loose stools.   Posttreatment pelvic MRI on 06/12/2022 showed a slightly less bulky uterus.  Significant decrease in the endometrium compared to previous exam with endometrium now measuring 4 mm in thickness (previously was 2.7 cm).  Slight heterogenous signal in the endometrium.  No adenopathy.   She was last seen for follow-up on 06/20/2022 with radiation oncology.  She was doing well at that time without any vaginal [name] or cramping.   MRI pelvis on 12/10/2022: Loss of normal differentiation between endometrium and zonal anatomy in general at the fundus and mid uterus.  Diffusion properties in this area are nondescript with no focal area of marked restricted diffusion.  Imaging findings are nonspecific although could reflect baseline treatment changes.  Small volume ascites may be reactive.  Report was addended after prior imaging was obtained.  Loss of  defined endometrium could be seen following radiotherapy.  Imaging findings remain more suggestive of posttreatment related changes from radiation but short interval follow-up suggested.   MRI pelvis on 03/09/2023 reveals unchanged appearance of the uterus, with diffuse loss of endometrial definition and mild T2 hyperintensity of the myometrium.  Normal appearance of the cervix and endocervical canal.  Unchanged left ovarian cyst measuring up to 3.9 cm, similar in size dating back to 2017 with no further follow-up recommended.  Unchanged small volume ascites.  Thought to most likely reflect chronic, posttreatment/postradiation appearance of the endometrium and uterus.  No interval change or other suspicious findings to suggest recurrence or residual disease.  Admitted in 07/2023 with SBO, resolved with conservative management.  Interval History: Doing well.  Denies any vaginal [name] or discharge.  Reports good bowel function.  Has had resolution of diarrhea since I saw her last, which had been present since radiation.  Continues to struggle with urinary urge and stress incontinence.  Has baseline dizziness/vertigo, unchanged, follows with her PCP for this.  Past Medical/Surgical History: Past Medical History:  Diagnosis Date   Aspiration pneumonitis (HCC)    in hosp for SBO 08/2023   Atrial fibrillation (HCC)    persistent (outpt monitoring 08/2022)   Bilateral lower extremity edema    Carpal tunnel syndrome    bilateral, had surgery  on right hand   Chronic renal insufficiency, stage 3 (moderate) (HCC)    GFR 50s   Diabetes mellitus without complication (HCC)    Dr. Sharl Ma   Diverticulosis    +hx of 'itis   Endometrial cancer [facility])    RT spring 2024   GERD (gastroesophageal reflux disease)    Gout    No problems after getting on allopurinol   Heart murmur    soft, systolic   History of blood transfusion    History of colon polyps    History of iron deficiency    Hyperlipidemia     Hypertension    Osteoarthritis, multiple sites    esp knees   SBO (small bowel obstruction) (HCC)    adhesions (admission 07/2023)   Urinary incontinence     Past Surgical History:  Procedure Laterality Date   CARPAL TUNNEL RELEASE Bilateral    R 1991.  L 2024   COLON SURGERY     COLONOSCOPY     x2.  Last was 2018   COLOSTOMY     COLOSTOMY REVERSAL     DILATATION & CURETTAGE/HYSTEROSCOPY WITH MYOSURE N/A 07/17/2019   Procedure: DILATATION & CURETTAGE/HYSTEROSCOPY WITH MYOSURE;  Surgeon: Candice Camp, MD;  Location: [facility];  Service: Gynecology;  Laterality: N/A;   OOPHORECTOMY  2000   unilateral   PILONIDAL CYST EXCISION     ROBOTIC ASSISTED TOTAL HYSTERECTOMY WITH BILATERAL SALPINGO OOPHERECTOMY N/A 08/20/2019   Procedure: DIAGNSOTIC LAPAROSCOPY, DILATION AND CURETTAGE, IUD PLACEMENT ;  Surgeon: Adolphus Birchwood, MD;  Location: [facility];  Service: Gynecology;  Laterality: N/A;   SKIN CANCER EXCISION     TRANSTHORACIC ECHOCARDIOGRAM     08/2022 EF 50-55%, inc PA pressure, nl RV fxn, valves ok    Family History  Problem Relation Age of Onset   Hypertension Mother    Skin cancer Father    Diabetes Sister    Cancer Sister    Hypertension Sister    Cancer Sister    Diabetes Sister    Hearing loss Sister    Hypertension Sister    Kidney disease Sister    Cancer Sister    Diabetes Brother    Diabetes Maternal Grandfather    Stroke Maternal Grandfather    Diabetes Paternal Grandmother    Hearing loss Daughter     Social History   Socioeconomic History   Marital status: Married    Spouse name: Not on file   Number of children: Not on file   Years of education: Not on file   Highest education level: Bachelor's degree (e.g., BA, AB, BS)  Occupational History   Not on file  Tobacco Use   Smoking status: Former    Current packs/day: 0.00    Average packs/day: 3.0 packs/day for 22.0 years (66.0 ttl pk-yrs)    Types: Cigarettes    Start date: 06/07/1965     Quit date: 06/08/1987    Years since quitting: 36.5   Smokeless tobacco: Never  Vaping Use   Vaping status: Never Used  Substance and Sexual Activity   Alcohol use: Yes    Comment: twice per week   Drug use: No   Sexual activity: Yes    Partners: Male    Birth control/protection: Post-menopausal    Comment: married  Other Topics Concern   Not on file  Social History Narrative   Married, 2 children, 2 grandchildren.   Originally from [place].   Education: Barista.   Occupation:  Retired Airline pilot.   History of smoking.   Social Drivers of Corporate investment banker Strain: Low Risk  (09/10/2023)   Overall Financial Resource Strain (CARDIA)    Difficulty of Paying Living Expenses: Not very hard  Food Insecurity: No Food Insecurity (09/10/2023)   Hunger Vital Sign    Worried About Running Out of Food in the Last Year: Never true    Ran Out of Food in the Last Year: Never true  Transportation Needs: No Transportation Needs (09/10/2023)   PRAPARE - Administrator, Civil Service (Medical): No    Lack of Transportation (Non-Medical): No  Physical Activity: Inactive (09/10/2023)   Exercise Vital Sign    Days of Exercise per Week: 0 days    Minutes of Exercise per Session: 0 min  Stress: No Stress Concern Present (09/10/2023)   [employer] of Occupational Health - Occupational Stress Questionnaire    Feeling of Stress : Only a little  Social Connections: Moderately Integrated (09/10/2023)   Social Connection and Isolation Panel [NHANES]    Frequency of Communication with Friends and Family: Three times a week    Frequency of Social Gatherings with Friends and Family: Once a week    Attends Religious Services: Never    Database administrator or Organizations: Yes    Attends Engineer, structural: More than 4 times per year    Marital Status: Married    Current Medications:  Current Outpatient Medications:    acetaminophen (TYLENOL) 500 MG  tablet, Take 1,000 mg by mouth every 6 (six) hours as needed for moderate pain., Disp: , Rfl:    allopurinol (ZYLOPRIM) 300 MG tablet, Take 1 tablet (300 mg total) by mouth daily., Disp: 90 tablet, Rfl: 1   amLODipine (NORVASC) 5 MG tablet, Take 1 tablet (5 mg total) by mouth daily., Disp: 90 tablet, Rfl: 1   atorvastatin (LIPITOR) 80 MG tablet, Take 1 tablet (80 mg total) by mouth at bedtime., Disp: 90 tablet, Rfl: 1   carvedilol (COREG) 12.5 MG tablet, Take 1 tablet (12.5 mg total) by mouth 2 (two) times daily with a meal., Disp: 180 tablet, Rfl: 3   Cholecalciferol (VITAMIN D3 PO), Take 1 capsule by mouth daily., Disp: , Rfl:    Coenzyme Q10 (COQ10) 100 MG CAPS, Take 100 mg by mouth daily., Disp: , Rfl:    ELIQUIS 5 MG TABS tablet, TAKE 1 TABLET BY MOUTH TWICE A DAY, Disp: 180 tablet, Rfl: 3   famotidine (PEPCID) 20 MG tablet, Take 20 mg by mouth daily. , Disp: , Rfl:    furosemide (LASIX) 40 MG tablet, Take 1 tablet in the morning., Disp: 90 tablet, Rfl: 1   GLUCOSAMINE-CHONDROITIN PO, Take 1 tablet by mouth 2 (two) times daily., Disp: , Rfl:    insulin NPH-regular Human (70-30) 100 UNIT/ML injection, Inject 50-70 Units into the skin See admin instructions. 70 units In the morning and 50 units in the evening, Disp: , Rfl:    KRILL OIL PO, Take 1 capsule by mouth daily., Disp: , Rfl:    losartan (COZAAR) 50 MG tablet, Take 1 tablet (50 mg total) by mouth daily., Disp: 90 tablet, Rfl: 0   meclizine (ANTIVERT) 12.5 MG tablet, Take 1 tablet (12.5 mg total) by mouth 3 (three) times daily as needed for dizziness., Disp: 30 tablet, Rfl: 0   MELATONIN PO, Take 10 mg by mouth at bedtime., Disp: , Rfl:    Multiple Vitamins-Minerals (MULTIVITAMIN WITH MINERALS) tablet, Take  1 tablet by mouth daily., Disp: , Rfl:    potassium chloride (KLOR-CON) 10 MEQ tablet, Take 1 tablet (10 mEq total) by mouth 2 (two) times daily. (Patient taking differently: Take 10 mEq by mouth daily.), Disp: 180 tablet, Rfl:  3  Review of Systems: + dizziness Denies appetite changes, fevers, chills, fatigue, unexplained weight changes. Denies hearing loss, neck lumps or masses, mouth sores, ringing in ears or voice changes. Denies cough or wheezing.  Denies shortness of breath. Denies chest pain or palpitations. Denies leg swelling. Denies abdominal distention, pain, blood in stools, constipation, diarrhea, nausea, vomiting, or early satiety. Denies pain with intercourse, dysuria, frequency, hematuria or incontinence. Denies hot flashes, pelvic pain, vaginal [name] or vaginal discharge.   Denies joint pain, back pain or muscle pain/cramps. Denies itching, rash, or wounds. Denies headaches, numbness or seizures. Denies swollen lymph nodes or glands, denies easy bruising or [name]. Denies anxiety, depression, confusion, or decreased concentration.  Physical Exam: BP (!) 152/74 (BP Location: Left Arm, Patient Position: Sitting, Cuff Size: Large)   Pulse 86   Temp 98.4 F (36.9 C) (Temporal)   Resp 18   Ht 5\' 4"  (1.626 m)   Wt (!) 320 lb (145.2 kg)   SpO2 97%   BMI 54.93 kg/m  General: Alert, oriented, no acute distress. HEENT: Normocephalic, atraumatic, sclera anicteric. Chest: Somewhat limited by body habitus but clear to auscultation bilaterally.   Cardiovascular: regular rate, regular rhythm, no murmurs.  Abdomen: Obese, soft, nontender.  Normoactive bowel sounds.  No masses or hepatosplenomegaly appreciated. Extremities: Somewhat limited range of motion.  Warm, well perfused.  1-2+ LE edema. Lymphatics: No cervical, supraclavicular, or inguinal adenopathy. GU: Normal appearing external genitalia without erythema, excoriation, or lesions.  Speculum exam reveals moderately atrophic vaginal mucosa.  Cervix is somewhat difficult to visualize and rather flush with the vaginal apex, atrophic.  No [name] or discharge.  Bimanual exam reveals no masses or nodularity, cervix flush with the vagina.  Uterine  assessment is limited by body habitus.  Rectovaginal exam confirms findings, no parametrial nodularity.  Laboratory & Radiologic Studies: None new  Assessment & Plan: Raylena Cornwall is a 73 y.o. woman with clinical stage I grade 1 endometrioid endometrial cancer, non-operative candidate s/p D&C and IUD (progestin releasing) on 08/20/19. Complete clinical response to progestin therapy with negative repeat sampling in December, 2020. Developed recurrence based on biopsy in 02/2021. She was started on Megace which was increased in 05/2022 when biopsy showed FIGO grade 2 endometrial cancer. Given persistent malignancy on biopsy, IUD removed 3/023 and patient treated with definitive RT completed in 03/2022.    Patient continues to do well without symptoms.  She is NED on exam although her exam is limited by prior radiation and body habitus.   Most recent MRI in August showed no significant change noted from to prior studies.  We will plan on repeat MRI in 4-6 months given inability to follow her clinically with exam, already scheduled in mid-February.    We will continue with surveillance visits every 3 months until 2-3 after completion of RT.  We discussed signs and symptoms that would be concerning for cancer recurrence, and I stressed the importance of calling if she develops any of these.  Discussed her urinary incontinence symptoms again.  She has previously been on medication for incontinence without much relief.  Discussed possible referral to urogynecology and asked her to reach out to me if she would like me to place this in the future.  20 minutes of total time was spent for this patient encounter, including preparation, face-to-face counseling with the patient and coordination of care, and documentation of the encounter.  Eugene Garnet, MD  Division of [name]  Department of Obstetrics and Gynecology  [facility]

## 2024-01-02 ENCOUNTER — Other Ambulatory Visit: Payer: Self-pay | Admitting: Internal Medicine

## 2024-01-02 MED ORDER — LOSARTAN POTASSIUM 50 MG PO TABS: 50.0000 mg | ORAL_TABLET | Freq: Every day | ORAL | 3 refills | Status: DC

## 2024-01-09 ENCOUNTER — Encounter: Payer: Self-pay | Admitting: Internal Medicine

## 2024-01-09 MED ORDER — LOSARTAN POTASSIUM 50 MG PO TABS: 50.0000 mg | ORAL_TABLET | Freq: Every day | ORAL | 3 refills | Status: DC

## 2024-01-17 ENCOUNTER — Encounter: Payer: Self-pay | Admitting: Family Medicine

## 2024-01-27 DIAGNOSIS — E1122 Type 2 diabetes mellitus with diabetic chronic kidney disease: Secondary | ICD-10-CM | POA: Diagnosis not present

## 2024-01-27 DIAGNOSIS — E785 Hyperlipidemia, unspecified: Secondary | ICD-10-CM | POA: Diagnosis not present

## 2024-01-27 DIAGNOSIS — D7589 Other specified diseases of blood and blood-forming organs: Secondary | ICD-10-CM | POA: Diagnosis not present

## 2024-01-27 DIAGNOSIS — D509 Iron deficiency anemia, unspecified: Secondary | ICD-10-CM | POA: Diagnosis not present

## 2024-01-27 DIAGNOSIS — N1831 Chronic kidney disease, stage 3a: Secondary | ICD-10-CM | POA: Diagnosis not present

## 2024-01-27 DIAGNOSIS — Z794 Long term (current) use of insulin: Secondary | ICD-10-CM | POA: Diagnosis not present

## 2024-02-02 ENCOUNTER — Other Ambulatory Visit: Payer: Self-pay | Admitting: Internal Medicine

## 2024-02-02 DIAGNOSIS — I10 Essential (primary) hypertension: Secondary | ICD-10-CM

## 2024-02-03 DIAGNOSIS — I8312 Varicose veins of left lower extremity with inflammation: Secondary | ICD-10-CM | POA: Diagnosis not present

## 2024-02-03 DIAGNOSIS — L821 Other seborrheic keratosis: Secondary | ICD-10-CM | POA: Diagnosis not present

## 2024-02-03 DIAGNOSIS — D225 Melanocytic nevi of trunk: Secondary | ICD-10-CM | POA: Diagnosis not present

## 2024-02-03 DIAGNOSIS — D2262 Melanocytic nevi of left upper limb, including shoulder: Secondary | ICD-10-CM | POA: Diagnosis not present

## 2024-02-03 DIAGNOSIS — I8311 Varicose veins of right lower extremity with inflammation: Secondary | ICD-10-CM | POA: Diagnosis not present

## 2024-02-03 DIAGNOSIS — D485 Neoplasm of uncertain behavior of skin: Secondary | ICD-10-CM | POA: Diagnosis not present

## 2024-02-03 DIAGNOSIS — I872 Venous insufficiency (chronic) (peripheral): Secondary | ICD-10-CM | POA: Diagnosis not present

## 2024-02-03 DIAGNOSIS — L304 Erythema intertrigo: Secondary | ICD-10-CM | POA: Diagnosis not present

## 2024-02-03 DIAGNOSIS — D1801 Hemangioma of skin and subcutaneous tissue: Secondary | ICD-10-CM | POA: Diagnosis not present

## 2024-02-10 ENCOUNTER — Ambulatory Visit (HOSPITAL_COMMUNITY)
Admission: RE | Admit: 2024-02-10 | Discharge: 2024-02-10 | Disposition: A | Discharge status: Home / Self Care | Payer: Medicare Other | Source: Ambulatory Visit | Attending: Gynecologic Oncology | Admitting: Gynecologic Oncology

## 2024-02-10 ENCOUNTER — Encounter: Payer: Self-pay | Admitting: Internal Medicine

## 2024-02-10 DIAGNOSIS — N83292 Other ovarian cyst, left side: Secondary | ICD-10-CM | POA: Diagnosis not present

## 2024-02-10 DIAGNOSIS — N888 Other specified noninflammatory disorders of cervix uteri: Secondary | ICD-10-CM | POA: Diagnosis not present

## 2024-02-10 DIAGNOSIS — K5730 Diverticulosis of large intestine without perforation or abscess without bleeding: Secondary | ICD-10-CM | POA: Diagnosis not present

## 2024-02-10 DIAGNOSIS — C541 Malignant neoplasm of endometrium: Secondary | ICD-10-CM | POA: Diagnosis not present

## 2024-02-10 DIAGNOSIS — I10 Essential (primary) hypertension: Secondary | ICD-10-CM

## 2024-02-10 MED ORDER — GADOBUTROL 1 MMOL/ML IV SOLN
10.0000 mL | Freq: Once | INTRAVENOUS | Status: AC | PRN
  Administered 2024-02-10: 10 mL via INTRAVENOUS

## 2024-02-11 MED ORDER — CARVEDILOL 12.5 MG PO TABS: 12.5000 mg | ORAL_TABLET | Freq: Two times a day (BID) | ORAL | 3 refills | Status: DC

## 2024-02-11 NOTE — Addendum Note (Signed)
Addended by: Margaret Pyle D on: 02/11/2024 10:53 AM   Modules accepted: Orders

## 2024-02-20 ENCOUNTER — Encounter: Payer: Self-pay | Admitting: Gynecologic Oncology

## 2024-03-09 ENCOUNTER — Encounter: Payer: Self-pay | Admitting: Podiatry

## 2024-03-09 ENCOUNTER — Ambulatory Visit (INDEPENDENT_AMBULATORY_CARE_PROVIDER_SITE_OTHER): Payer: Medicare Other | Admitting: Podiatry

## 2024-03-09 DIAGNOSIS — M79674 Pain in right toe(s): Secondary | ICD-10-CM | POA: Diagnosis not present

## 2024-03-09 DIAGNOSIS — B351 Tinea unguium: Secondary | ICD-10-CM | POA: Diagnosis not present

## 2024-03-09 DIAGNOSIS — E118 Type 2 diabetes mellitus with unspecified complications: Secondary | ICD-10-CM

## 2024-03-09 DIAGNOSIS — M79675 Pain in left toe(s): Secondary | ICD-10-CM

## 2024-03-09 NOTE — Progress Notes (Signed)
 This patient returns to my office for at risk foot care.  This patient requires this care by a professional since this patient will be at risk due to having diabetes.  Her right lower leg is draining. This patient is unable to cut nails herself since the patient cannot reach her nails.These nails are painful walking and wearing shoes.  This patient presents for at risk foot care today.  General Appearance  Alert, conversant and in no acute stress.  Vascular  Dorsalis pedis and posterior tibial  pulses are palpable  bilaterally.  Capillary return is within normal limits  bilaterally. Temperature is within normal limits  bilaterally.  Neurologic  Senn-Weinstein monofilament wire test within normal limits  bilaterally. Muscle power within normal limits bilaterally.  Nails Thick disfigured discolored nails with subungual debris  from hallux to fifth toes bilaterally. No evidence of bacterial infection or drainage bilaterally.  Orthopedic  No limitations of motion  feet .  No crepitus or effusions noted.  No bony pathology or digital deformities noted.  Skin  normotropic skin with no porokeratosis noted bilaterally.  No signs of infections or ulcers noted.     Onychomycosis  Pain in right toes  Pain in left toes  Consent was obtained for treatment procedures.   Mechanical debridement of nails 1-5  bilaterally performed with a nail nipper.  Filed with dremel without incident.    Return office visit      12     weeks              Told patient to return for periodic foot care and evaluation due to potential at risk complications.   Helane Gunther DPM

## 2024-03-11 ENCOUNTER — Ambulatory Visit: Payer: Medicare Other | Admitting: Family Medicine

## 2024-03-11 ENCOUNTER — Encounter: Payer: Self-pay | Admitting: Internal Medicine

## 2024-03-11 NOTE — Telephone Encounter (Signed)
 Go ahead and rewrite lasix

## 2024-03-12 ENCOUNTER — Other Ambulatory Visit: Payer: Self-pay

## 2024-03-12 MED ORDER — FUROSEMIDE 40 MG PO TABS: ORAL_TABLET | ORAL | 3 refills | Status: DC

## 2024-03-12 NOTE — Patient Instructions (Signed)
 Marland Kitchen

## 2024-03-15 ENCOUNTER — Encounter: Payer: Self-pay | Admitting: Family Medicine

## 2024-03-15 NOTE — Progress Notes (Signed)
 OFFICE VISIT  04/19/2024  CC:  Chief Complaint  Patient presents with   Medical Management of Chronic Issues   Patient is a 74 y.o. female who presents for 3 mo f/u chronic diastolic HF, CRI III, and HTN. A/P as of last visit: "1) Chronic diastolic HF, asymptomatic but activity very limited by her osteoarthritis.   2) CRI III. Avoid NSAIDs. Lytes/cr today.   3) HTN, stable on amlod 5mg  every day, coreg  12.5 bid, and losartan  50 every day. Lytes/cr today.   4) BPPV, cardiology arranging vestib rehab.   5) DM per Dr. Kathyanne Parkers."  INTERIM HX: Doing good.  No acute concerns.  Past Medical History:  Diagnosis Date   Aspiration pneumonitis (HCC)    in hosp for SBO 08/2023   Asymptomatic cholelithiasis    Atrial fibrillation (HCC)    persistent (outpt monitoring 08/2022)   Bilateral lower extremity edema    Carpal tunnel syndrome    bilateral, had surgery on right hand   Chronic renal insufficiency, stage 3 (moderate) (HCC)    GFR 50s   Diabetes mellitus without complication (HCC)    Dr. Kathyanne Parkers   Diverticulosis    +hx of 'itis   Endometrial cancer [facility])    RT spring 2024   GERD (gastroesophageal reflux disease)    Gout    No problems after getting on allopurinol    Heart murmur    soft, systolic   History of blood transfusion    History of colon polyps    History of iron deficiency    Hyperlipidemia    Hypertension    Osteoarthritis, multiple sites    esp knees   SBO (small bowel obstruction) (HCC)    adhesions (admission 07/2023)   Urinary incontinence     Past Surgical History:  Procedure Laterality Date   CARPAL TUNNEL RELEASE Bilateral    R 1991.  L 2024   Cataract  04/01/2024   Right   COLON SURGERY     COLONOSCOPY     x2.  Last was 2018   COLOSTOMY     COLOSTOMY REVERSAL     DILATATION & CURETTAGE/HYSTEROSCOPY WITH MYOSURE N/A 07/17/2019   Procedure: DILATATION & CURETTAGE/HYSTEROSCOPY WITH MYOSURE;  Surgeon: Belle Box, MD;  Location: [facility];  Service: Gynecology;  Laterality: N/A;   OOPHORECTOMY  2000   unilateral   PILONIDAL CYST EXCISION     ROBOTIC ASSISTED TOTAL HYSTERECTOMY WITH BILATERAL SALPINGO OOPHERECTOMY N/A 08/20/2019   Procedure: DIAGNSOTIC LAPAROSCOPY, DILATION AND CURETTAGE, IUD PLACEMENT ;  Surgeon: Alphonso Aschoff, MD;  Location: [facility];  Service: Gynecology;  Laterality: N/A;   SKIN CANCER EXCISION     TRANSTHORACIC ECHOCARDIOGRAM     08/2022 EF 50-55%, inc PA pressure, nl RV fxn, valves ok    Outpatient Medications Prior to Visit  Medication Sig Dispense Refill   acetaminophen  (TYLENOL ) 500 MG tablet Take 1,000 mg by mouth every 6 (six) hours as needed for moderate pain.     carvedilol  (COREG ) 12.5 MG tablet Take 1 tablet (12.5 mg total) by mouth 2 (two) times daily with a meal. 180 tablet 3   Cholecalciferol (VITAMIN D3 PO) Take 1 capsule by mouth daily.     Coenzyme Q10 (COQ10) 100 MG CAPS Take 100 mg by mouth daily.     ELIQUIS  5 MG TABS tablet TAKE 1 TABLET BY MOUTH TWICE A DAY 180 tablet 3   famotidine (PEPCID) 20 MG tablet Take 20 mg by mouth daily.  GLUCOSAMINE-CHONDROITIN PO Take 1 tablet by mouth 2 (two) times daily.     insulin  NPH-regular Human (70-30) 100 UNIT/ML injection Inject 50-70 Units into the skin See admin instructions. 70 units In the morning and 50 units in the evening     KRILL OIL PO Take 1 capsule by mouth daily.     losartan  (COZAAR ) 50 MG tablet Take 1 tablet (50 mg total) by mouth daily. 90 tablet 3   meclizine  (ANTIVERT ) 12.5 MG tablet Take 1 tablet (12.5 mg total) by mouth 3 (three) times daily as needed for dizziness. 30 tablet 0   MELATONIN PO Take 10 mg by mouth at bedtime.     Multiple Vitamins-Minerals (MULTIVITAMIN WITH MINERALS) tablet Take 1 tablet by mouth daily.     potassium chloride  (KLOR-CON ) 10 MEQ tablet Take 1 tablet (10 mEq total) by mouth 2 (two) times daily. (Patient taking differently: Take 10 mEq by mouth daily.) 180 tablet 3   furosemide   (LASIX ) 40 MG tablet Take 1 tablet in the morning. (Patient taking differently: Take 2 tablet in the morning.) 90 tablet 3   allopurinol  (ZYLOPRIM ) 300 MG tablet Take 1 tablet (300 mg total) by mouth daily. 90 tablet 1   amLODipine  (NORVASC ) 5 MG tablet Take 1 tablet (5 mg total) by mouth daily. 90 tablet 1   atorvastatin  (LIPITOR ) 80 MG tablet Take 1 tablet (80 mg total) by mouth at bedtime. 90 tablet 1   No facility-administered medications prior to visit.    Allergies  Allergen Reactions   Lisinopril Cough    Other reaction(s): cough, Not available   Actos [Pioglitazone]     Unknown reaction   Metformin Other (See Comments)   Metformin And Related     Gas     Review of Systems As per HPI  PE:    04/03/2024    3:43 PM 04/03/2024    3:25 PM 03/16/2024    1:06 PM  Vitals with BMI  Height   5\' 4"   Weight  324 lbs 10 oz 318 lbs  BMI  55.69 54.56  Systolic 152 154 284  Diastolic 74 71 72  Pulse  68 80     Physical Exam  General: Alert and well-appearing, sitting in wheelchair. Cardiovascular irregularly irregular rhythm, rate approximately 80, no murmur. Lungs are clear bilaterally, breathing is nonlabored. Extremities show fibrotic skin changes and mild hyperpigmentation but no pitting edema.  LABS:  Last CBC Lab Results  Component Value Date   WBC 5.5 03/16/2024   HGB 13.3 03/16/2024   HCT 39.1 03/16/2024   MCV 99.7 03/16/2024   MCH 32.3 08/04/2023   RDW 15.4 03/16/2024   PLT 204.0 03/16/2024   Lab Results  Component Value Date   IRON 89 03/16/2024   TIBC 344 03/16/2024   FERRITIN 38 03/16/2024   Last metabolic panel Lab Results  Component Value Date   GLUCOSE 185 (H) 03/16/2024   NA 142 03/16/2024   K 4.1 03/16/2024   CL 105 03/16/2024   CO2 28 03/16/2024   BUN 27 (H) 03/16/2024   CREATININE 1.10 03/16/2024   GFR 49.13 (L) 03/16/2024   CALCIUM  9.9 03/16/2024   PROT 6.4 03/16/2024   ALBUMIN 4.3 03/16/2024   BILITOT 0.5 03/16/2024   ALKPHOS 138  (H) 03/16/2024   AST 17 03/16/2024   ALT 14 03/16/2024   ANIONGAP 12 08/06/2023   Last lipids Lab Results  Component Value Date   CHOL 148 03/16/2024   HDL 37.90 (L) 03/16/2024  LDLCALC 61 03/16/2024   TRIG 244.0 (H) 03/16/2024   CHOLHDL 4 03/16/2024   Last hemoglobin A1c Lab Results  Component Value Date   HGBA1C 5.9 (H) 08/01/2023   Last thyroid  functions Lab Results  Component Value Date   TSH 6.020 (H) 09/10/2022   Lab Results  Component Value Date   LABURIC 6.0 08/07/2022    IMPRESSION AND PLAN:  1) Chronic diastolic HF, asymptomatic but activity very limited by her osteoarthritis. Stable.  Continue losartan  50 mg a day, Coreg  12.5 mg twice a day, and Lasix  40 mg daily.  2) CRI III. Avoid NSAIDs. Lytes/cr today.   3) HTN, stable on amlod 5mg  every day, coreg  12.5 bid, and losartan  50 every day. Lytes/cr today.  #4 hypercholesterolemia. Stable on a atorvastatin  80 mg a day. Lipid panel and hepatic panel today.  5.  DM per endo Dr. Kathyanne Parkers.  An After Visit Summary was printed and given to the patient.  FOLLOW UP: Return in about 3 months (around 06/16/2024) for routine chronic illness f/u.  Signed:  Arletha Lady, MD           04/19/2024

## 2024-03-16 ENCOUNTER — Ambulatory Visit (INDEPENDENT_AMBULATORY_CARE_PROVIDER_SITE_OTHER): Payer: Medicare Other | Admitting: Family Medicine

## 2024-03-16 ENCOUNTER — Encounter: Payer: Self-pay | Admitting: Family Medicine

## 2024-03-16 VITALS — BP 136/72 | HR 80 | Temp 98.4°F | Ht 64.0 in | Wt 318.0 lb

## 2024-03-16 DIAGNOSIS — N1831 Chronic kidney disease, stage 3a: Secondary | ICD-10-CM

## 2024-03-16 DIAGNOSIS — I10 Essential (primary) hypertension: Secondary | ICD-10-CM

## 2024-03-16 DIAGNOSIS — D631 Anemia in chronic kidney disease: Secondary | ICD-10-CM

## 2024-03-16 DIAGNOSIS — Z23 Encounter for immunization: Secondary | ICD-10-CM

## 2024-03-16 DIAGNOSIS — N183 Chronic kidney disease, stage 3 unspecified: Secondary | ICD-10-CM

## 2024-03-16 DIAGNOSIS — E7800 Pure hypercholesterolemia, unspecified: Secondary | ICD-10-CM | POA: Diagnosis not present

## 2024-03-16 LAB — COMPREHENSIVE METABOLIC PANEL
ALT: 14 U/L (ref 0–35)
AST: 17 U/L (ref 0–37)
Albumin: 4.3 g/dL (ref 3.5–5.2)
Alkaline Phosphatase: 138 U/L — ABNORMAL HIGH (ref 39–117)
BUN: 27 mg/dL — ABNORMAL HIGH (ref 6–23)
CO2: 28 meq/L (ref 19–32)
Calcium: 9.9 mg/dL (ref 8.4–10.5)
Chloride: 105 meq/L (ref 96–112)
Creatinine, Ser: 1.1 mg/dL (ref 0.40–1.20)
GFR: 49.13 mL/min — ABNORMAL LOW (ref >60.00)
Glucose, Bld: 185 mg/dL — ABNORMAL HIGH (ref 70–99)
Potassium: 4.1 meq/L (ref 3.5–5.1)
Sodium: 142 meq/L (ref 135–145)
Total Bilirubin: 0.5 mg/dL (ref 0.2–1.2)
Total Protein: 6.4 g/dL (ref 6.0–8.3)

## 2024-03-16 LAB — CBC
HCT: 39.1 % (ref 36.0–46.0)
Hemoglobin: 13.3 g/dL (ref 12.0–15.0)
MCHC: 34 g/dL (ref 30.0–36.0)
MCV: 99.7 fl (ref 78.0–100.0)
Platelets: 204 10*3/uL (ref 150.0–400.0)
RBC: 3.92 Mil/uL (ref 3.87–5.11)
RDW: 15.4 % (ref 11.5–15.5)
WBC: 5.5 10*3/uL (ref 4.0–10.5)

## 2024-03-16 LAB — LIPID PANEL
Cholesterol: 148 mg/dL (ref 0–200)
HDL: 37.9 mg/dL — ABNORMAL LOW (ref >39.00)
LDL Cholesterol: 61 mg/dL (ref 0–99)
NonHDL: 110.08
Total CHOL/HDL Ratio: 4
Triglycerides: 244 mg/dL — ABNORMAL HIGH (ref 0.0–149.0)
VLDL: 48.8 mg/dL — ABNORMAL HIGH (ref 0.0–40.0)

## 2024-03-16 MED ORDER — AMLODIPINE BESYLATE 5 MG PO TABS: 5.0000 mg | ORAL_TABLET | Freq: Every day | ORAL | 1 refills | Status: DC

## 2024-03-16 MED ORDER — ATORVASTATIN CALCIUM 80 MG PO TABS: 80.0000 mg | ORAL_TABLET | Freq: Every day | ORAL | 1 refills | Status: DC

## 2024-03-16 MED ORDER — FUROSEMIDE 40 MG PO TABS: ORAL_TABLET | ORAL | 1 refills | Status: DC

## 2024-03-16 MED ORDER — ALLOPURINOL 300 MG PO TABS: 300.0000 mg | ORAL_TABLET | Freq: Every day | ORAL | 1 refills | Status: DC

## 2024-03-17 LAB — IRON,TIBC AND FERRITIN PANEL
%SAT: 26 % (ref 16–45)
Ferritin: 38 ng/mL (ref 16–288)
Iron: 89 ug/dL (ref 45–160)
TIBC: 344 ug/dL (ref 250–450)

## 2024-03-27 DIAGNOSIS — H52203 Unspecified astigmatism, bilateral: Secondary | ICD-10-CM | POA: Diagnosis not present

## 2024-03-27 DIAGNOSIS — H2513 Age-related nuclear cataract, bilateral: Secondary | ICD-10-CM | POA: Diagnosis not present

## 2024-04-01 ENCOUNTER — Encounter: Payer: Self-pay | Admitting: Gynecologic Oncology

## 2024-04-01 DIAGNOSIS — H2511 Age-related nuclear cataract, right eye: Secondary | ICD-10-CM | POA: Diagnosis not present

## 2024-04-01 DIAGNOSIS — H25811 Combined forms of age-related cataract, right eye: Secondary | ICD-10-CM | POA: Diagnosis not present

## 2024-04-01 HISTORY — PX: OTHER SURGICAL HISTORY: SHX169

## 2024-04-03 ENCOUNTER — Inpatient Hospital Stay: Payer: Medicare Other | Attending: Gynecologic Oncology | Admitting: Gynecologic Oncology

## 2024-04-03 ENCOUNTER — Other Ambulatory Visit: Payer: Self-pay

## 2024-04-03 ENCOUNTER — Encounter: Payer: Self-pay | Admitting: Gynecologic Oncology

## 2024-04-03 VITALS — BP 152/74 | HR 68 | Temp 98.7°F | Resp 20 | Wt 324.6 lb

## 2024-04-03 DIAGNOSIS — Z923 Personal history of irradiation: Secondary | ICD-10-CM | POA: Diagnosis not present

## 2024-04-03 DIAGNOSIS — C541 Malignant neoplasm of endometrium: Secondary | ICD-10-CM | POA: Diagnosis not present

## 2024-04-03 DIAGNOSIS — Z6841 Body Mass Index (BMI) 40.0 and over, adult: Secondary | ICD-10-CM | POA: Insufficient documentation

## 2024-04-03 NOTE — Progress Notes (Signed)
 Gynecologic Oncology Return Clinic Visit  04/03/24  Reason for Visit: surveillance   Treatment History: Patient reports a history of postmenopausal bleeding since July 2020.  Is been persistent low low level bleeding, spotting but no heavy passage of clots.  She reported this to her gynecologist who ordered a transvaginal ultrasound on June 24, 2019.  This revealed a uterus measuring 6.7 x 3.4 x 5.5 cm with an endometrial thickness of 1.4 cm.  The ovaries were not visualized.   She was taken to the operating room for a curettage and polypectomy on July 17, 2019 which revealed FIGO grade 1 endometrioid adenocarcinoma.   Patient has a complex medical history.  She is morbidly obese with a BMI of 48 kg/m.  She experienced ruptured diverticulitis in 1997 which required a diverting colostomy and Hartman's pouch.  She 6 weeks later she was taken to the operating room for laparotomy and reversal of Hartman's.  She reports that the surgeon described her abdomen is looking like a gallon of superglue had been poured inside her.  It was an extensively long surgery.  She is unsure if additional bowel resections were necessary.  She had a 1 month hospitalization following this reversal of colostomy.  She developed a bowel obstruction during that hospitalization and required reoperation to resolve this.   In 2000 she developed a growth on an ovary (she cannot remember which side).  For this she underwent laparotomy and oophorectomy.  This surgery was performed in [place].  She was planning to have a hysterectomy at that procedure, however the surgeon aborted plans for hysterectomy and performed only oophorectomy after encountering significant adhesive disease.  She states that he told her surgery would have needed to the last 6 to 8 hours in order to perform hysterectomy due to the adhesive disease.   The patient has type 2 diabetes mellitus and has had this for greater than 30 years.  She reports having no  endorgan disease.  Her hemoglobin A1c was 5.4% in May 2020.   Her family history is significant for a father with skin cancer.  She has 3 sisters with cancer including one with endometrial cancer who is obese, a sister with stage II 3 breast cancer, and multiple myeloma.   She has had 2 prior vaginal deliveries.   She lives in [place].   On August 20, 2019 she underwent an attempt at a robotic assisted total hysterectomy BSO and sentinel lymph node biopsy.  The surgery was aborted after laparoscopic entry into the left upper quadrant encountered dense adhesions throughout the entirety of the abdomen which included dense colonic and small bowel adhesions to the anterior abdominal wall.  The density of these adhesions and their comprehensive nature across the entirety of her abdominal wall meant that it was not possible to place additional laparoscopic ports to perform adhesiolysis.  She would have required a laparotomy in order to achieve hysterectomy, and the patient had declined this preoperatively, and had elected to proceed with D&C with progestin releasing IUD placement if hysterectomy was not possible via minimally invasive surgical route.  Therefore we aborted the laparoscopic procedure and performed a D&C procedure with placement of a progestin releasing IUD.   Final pathology from the [facility] specimen showed adenocarcinoma favor endometrial primary.  It was difficult to grade the specimen due to his fragmented nature.   Repeat biopsy (office) sampling in December, 2020 showed complete clinical response with no residual carcinoma.    The patient then transferred care to  Dr. Donnamarie Rossetti in [place].  Repeat endometrial biopsy on 03/23/2021 showed residual endometrioid adenocarcinoma.   Repeat biopsy on 06/22/2021 showed FIGO grade 2 endometrioid adenocarcinoma.  There is a comment that her Megace, which must of been started prior to this, was increased to 160 mg twice daily.   Endometrial biopsy on  09/22/2021 showed residual endometrioid adenocarcinoma, FIGO grade 1-2, in the background of endometrium with exogenous hormone effect.   Pelvic MRI on 12/05/2021 noted a 3.3 cm simple left ovarian cyst, colonic diverticulosis, anterior endometrial mass in the uterine body with an IUD adjacent, suspicious for endometrial cancer.   Repeat endometrial biopsy on 02/01/2022 showed residual endometrial carcinoma, FIGO grade 1.   Staging CT of the chest, abdomen, and pelvis on 02/15/2022 revealed no adenopathy, no aggressive appearing osseous lesions.  Diastases recti with superimposed shallow ventral wall hernia measuring 1 x 7 cm.  Cholelithiasis noted without acute cholecystitis.   03/27/22: IUD removed.   Patient proceeded with definitive radiation therapy from 03/06/2022 - 04/18/2022.  She was treated with EBRT (4500 cGy in 25 fractions) utilizing IMRT treatment technique.  This was followed by weekly T&O HDR treatment to a total dose of 3000 cGy in 5 fractions.  Total treatment dose was 7500 cGy.  Treatment was tolerated well other than fatigue and mild loose stools.   Posttreatment pelvic MRI on 06/12/2022 showed a slightly less bulky uterus.  Significant decrease in the endometrium compared to previous exam with endometrium now measuring 4 mm in thickness (previously was 2.7 cm).  Slight heterogenous signal in the endometrium.  No adenopathy.   She was last seen for follow-up on 06/20/2022 with radiation oncology.  She was doing well at that time without any vaginal bleeding or cramping.   MRI pelvis on 12/10/2022: Loss of normal differentiation between endometrium and zonal anatomy in general at the fundus and mid uterus.  Diffusion properties in this area are nondescript with no focal area of marked restricted diffusion.  Imaging findings are nonspecific although could reflect baseline treatment changes.  Small volume ascites may be reactive.  Report was addended after prior imaging was obtained.  Loss of  defined endometrium could be seen following radiotherapy.  Imaging findings remain more suggestive of posttreatment related changes from radiation but short interval follow-up suggested.   MRI pelvis on 03/09/2023 reveals unchanged appearance of the uterus, with diffuse loss of endometrial definition and mild T2 hyperintensity of the myometrium.  Normal appearance of the cervix and endocervical canal.  Unchanged left ovarian cyst measuring up to 3.9 cm, similar in size dating back to 2017 with no further follow-up recommended.  Unchanged small volume ascites.  Thought to most likely reflect chronic, posttreatment/postradiation appearance of the endometrium and uterus.  No interval change or other suspicious findings to suggest recurrence or residual disease.   Admitted in 07/2023 with SBO, resolved with conservative management.  MRI pelvis 08/2023 with stable exam, no evidence of local recurrence or metastatic disease.  Continued visualization of loss of endometrial/myometrial interface, likely secondary to treatment.  Stable 4.2 cm cystic structure in the left adnexa.  MRI pelvis 02/2024 with unchanged posttreatment appearance of the uterus, no evidence of internal endometrial structure mass.  No adenopathy or metastatic disease in the pelvis.  Unchanged simple appearing fluid signal cyst in the left ovary.  Diffuse thickening of urinary bladder wall, similar to prior exam.  Small volume free fluid in the pelvis.  Interval History: Patient reports she is doing well.  Had cataract surgery  earlier this week.  Denies any vaginal bleeding or discharge.  Reports baseline bowel bladder function.  Past Medical/Surgical History: Past Medical History:  Diagnosis Date   Aspiration pneumonitis (HCC)    in hosp for SBO 08/2023   Asymptomatic cholelithiasis    Atrial fibrillation (HCC)    persistent (outpt monitoring 08/2022)   Bilateral lower extremity edema    Carpal tunnel syndrome    bilateral, had surgery  on right hand   Chronic renal insufficiency, stage 3 (moderate) (HCC)    GFR 50s   Diabetes mellitus without complication (HCC)    Dr. Sharl Ma   Diverticulosis    +hx of 'itis   Endometrial cancer [facility])    RT spring 2024   GERD (gastroesophageal reflux disease)    Gout    No problems after getting on allopurinol   Heart murmur    soft, systolic   History of blood transfusion    History of colon polyps    History of iron deficiency    Hyperlipidemia    Hypertension    Osteoarthritis, multiple sites    esp knees   SBO (small bowel obstruction) (HCC)    adhesions (admission 07/2023)   Urinary incontinence     Past Surgical History:  Procedure Laterality Date   CARPAL TUNNEL RELEASE Bilateral    R 1991.  L 2024   Cataract  04/01/2024   Right   COLON SURGERY     COLONOSCOPY     x2.  Last was 2018   COLOSTOMY     COLOSTOMY REVERSAL     DILATATION & CURETTAGE/HYSTEROSCOPY WITH MYOSURE N/A 07/17/2019   Procedure: DILATATION & CURETTAGE/HYSTEROSCOPY WITH MYOSURE;  Surgeon: Candice Camp, MD;  Location: [facility];  Service: Gynecology;  Laterality: N/A;   OOPHORECTOMY  2000   unilateral   PILONIDAL CYST EXCISION     ROBOTIC ASSISTED TOTAL HYSTERECTOMY WITH BILATERAL SALPINGO OOPHERECTOMY N/A 08/20/2019   Procedure: DIAGNSOTIC LAPAROSCOPY, DILATION AND CURETTAGE, IUD PLACEMENT ;  Surgeon: Adolphus Birchwood, MD;  Location: [facility];  Service: Gynecology;  Laterality: N/A;   SKIN CANCER EXCISION     TRANSTHORACIC ECHOCARDIOGRAM     08/2022 EF 50-55%, inc PA pressure, nl RV fxn, valves ok    Family History  Problem Relation Age of Onset   Hypertension Mother    Skin cancer Father    Diabetes Sister    Cancer Sister    Hypertension Sister    Cancer Sister    Diabetes Sister    Hearing loss Sister    Hypertension Sister    Kidney disease Sister    Cancer Sister    Diabetes Brother    Diabetes Maternal Grandfather    Stroke Maternal Grandfather    Diabetes Paternal  Grandmother    Hearing loss Daughter     Social History   Socioeconomic History   Marital status: Married    Spouse name: Not on file   Number of children: Not on file   Years of education: Not on file   Highest education level: Bachelor's degree (e.g., BA, AB, BS)  Occupational History   Not on file  Tobacco Use   Smoking status: Former    Current packs/day: 0.00    Average packs/day: 3.0 packs/day for 22.0 years (66.0 ttl pk-yrs)    Types: Cigarettes    Start date: 06/07/1965    Quit date: 06/08/1987    Years since quitting: 36.8   Smokeless tobacco: Never  Vaping Use  Vaping status: Never Used  Substance and Sexual Activity   Alcohol use: Yes    Comment: twice per week   Drug use: No   Sexual activity: Yes    Partners: Male    Birth control/protection: Post-menopausal    Comment: married  Other Topics Concern   Not on file  Social History Narrative   Married, 2 children, 2 grandchildren.   Originally from [place].   Education: Barista.   Occupation: Retired Airline pilot.   History of smoking.   Social Drivers of Corporate investment banker Strain: Low Risk  (09/10/2023)   Overall Financial Resource Strain (CARDIA)    Difficulty of Paying Living Expenses: Not very hard  Food Insecurity: No Food Insecurity (09/10/2023)   Hunger Vital Sign    Worried About Running Out of Food in the Last Year: Never true    Ran Out of Food in the Last Year: Never true  Transportation Needs: No Transportation Needs (09/10/2023)   PRAPARE - Administrator, Civil Service (Medical): No    Lack of Transportation (Non-Medical): No  Physical Activity: Inactive (09/10/2023)   Exercise Vital Sign    Days of Exercise per Week: 0 days    Minutes of Exercise per Session: 0 min  Stress: No Stress Concern Present (09/10/2023)   [employer] of Occupational Health - Occupational Stress Questionnaire    Feeling of Stress : Only a little  Social Connections:  Moderately Integrated (09/10/2023)   Social Connection and Isolation Panel [NHANES]    Frequency of Communication with Friends and Family: Three times a week    Frequency of Social Gatherings with Friends and Family: Once a week    Attends Religious Services: Never    Database administrator or Organizations: Yes    Attends Engineer, structural: More than 4 times per year    Marital Status: Married    Current Medications:  Current Outpatient Medications:    acetaminophen (TYLENOL) 500 MG tablet, Take 1,000 mg by mouth every 6 (six) hours as needed for moderate pain., Disp: , Rfl:    allopurinol (ZYLOPRIM) 300 MG tablet, Take 1 tablet (300 mg total) by mouth daily., Disp: 90 tablet, Rfl: 1   amLODipine (NORVASC) 5 MG tablet, Take 1 tablet (5 mg total) by mouth daily., Disp: 90 tablet, Rfl: 1   atorvastatin (LIPITOR) 80 MG tablet, Take 1 tablet (80 mg total) by mouth at bedtime., Disp: 90 tablet, Rfl: 1   carvedilol (COREG) 12.5 MG tablet, Take 1 tablet (12.5 mg total) by mouth 2 (two) times daily with a meal., Disp: 180 tablet, Rfl: 3   Cholecalciferol (VITAMIN D3 PO), Take 1 capsule by mouth daily., Disp: , Rfl:    Coenzyme Q10 (COQ10) 100 MG CAPS, Take 100 mg by mouth daily., Disp: , Rfl:    ELIQUIS 5 MG TABS tablet, TAKE 1 TABLET BY MOUTH TWICE A DAY, Disp: 180 tablet, Rfl: 3   famotidine (PEPCID) 20 MG tablet, Take 20 mg by mouth daily. , Disp: , Rfl:    furosemide (LASIX) 40 MG tablet, Take 2 tablet in the morning., Disp: 180 tablet, Rfl: 1   GLUCOSAMINE-CHONDROITIN PO, Take 1 tablet by mouth 2 (two) times daily., Disp: , Rfl:    insulin NPH-regular Human (70-30) 100 UNIT/ML injection, Inject 50-70 Units into the skin See admin instructions. 70 units In the morning and 50 units in the evening, Disp: , Rfl:    KRILL OIL PO, Take  1 capsule by mouth daily., Disp: , Rfl:    losartan (COZAAR) 50 MG tablet, Take 1 tablet (50 mg total) by mouth daily., Disp: 90 tablet, Rfl: 3    meclizine (ANTIVERT) 12.5 MG tablet, Take 1 tablet (12.5 mg total) by mouth 3 (three) times daily as needed for dizziness., Disp: 30 tablet, Rfl: 0   MELATONIN PO, Take 10 mg by mouth at bedtime., Disp: , Rfl:    Multiple Vitamins-Minerals (MULTIVITAMIN WITH MINERALS) tablet, Take 1 tablet by mouth daily., Disp: , Rfl:    potassium chloride (KLOR-CON) 10 MEQ tablet, Take 1 tablet (10 mEq total) by mouth 2 (two) times daily. (Patient taking differently: Take 10 mEq by mouth daily.), Disp: 180 tablet, Rfl: 3  Review of Systems: + incontinence, problem with walking Denies appetite changes, fevers, chills, fatigue, unexplained weight changes. Denies hearing loss, neck lumps or masses, mouth sores, ringing in ears or voice changes. Denies cough or wheezing.  Denies shortness of breath. Denies chest pain or palpitations. Denies leg swelling. Denies abdominal distention, pain, blood in stools, constipation, diarrhea, nausea, vomiting, or early satiety. Denies pain with intercourse, dysuria, frequency, hematuria. Denies hot flashes, pelvic pain, vaginal bleeding or vaginal discharge.   Denies joint pain, back pain or muscle pain/cramps. Denies itching, rash, or wounds. Denies dizziness, headaches, numbness or seizures. Denies swollen lymph nodes or glands, denies easy bruising or bleeding. Denies anxiety, depression, confusion, or decreased concentration.  Physical Exam: BP (!) 154/71 (BP Location: Left Arm, Patient Position: Sitting) Comment: Notified RN  Pulse 68   Temp 98.7 F (37.1 C) (Oral)   Resp 20   Wt (!) 324 lb 9.6 oz (147.2 kg)   SpO2 96%   BMI 55.72 kg/m  General: Alert, oriented, no acute distress. HEENT: Normocephalic, atraumatic, sclera anicteric. Chest: Somewhat limited by body habitus but clear to auscultation bilaterally.   Cardiovascular: regular rate, regular rhythm, no murmurs.  Abdomen: Obese, soft, nontender.  Normoactive bowel sounds.  No masses or hepatosplenomegaly  appreciated. Extremities: Somewhat limited range of motion.  Warm, well perfused.  1-2+ LE edema. Lymphatics: No cervical, supraclavicular, or inguinal adenopathy. GU: Normal appearing external genitalia without erythema, excoriation, or lesions. Bimanual exam reveals no masses or nodularity, cervix flush with the vagina.  Uterine assessment is limited by body habitus.  Rectovaginal exam confirms findings, no parametrial nodularity.  Laboratory & Radiologic Studies: None new  Assessment & Plan: [name] is a 74 y.o. woman with clinical stage I grade 1 endometrioid endometrial cancer, non-operative candidate s/p D&C and IUD (progestin releasing) on 08/20/19. Complete clinical response to progestin therapy with negative repeat sampling in December, 2020. Developed recurrence based on biopsy in 02/2021. She was started on Megace which was increased in 05/2022 when biopsy showed FIGO grade 2 endometrial cancer. Given persistent malignancy on biopsy, IUD removed 3/023 and patient treated with definitive RT completed in 03/2022.    Patient continues to do well without symptoms.  She is NED on exam although her exam is limited by prior radiation and body habitus.    Most recent MRI in February showed no significant change noted from to prior studies.  We will plan on repeat MRI in 6 months given inability to follow her clinically with exam.    We will continue with surveillance visits every 3 months until 2-3 after completion of RT.  We discussed signs and symptoms that would be concerning for cancer recurrence, and I stressed the importance of calling if she develops any of  these.   20 minutes of total time was spent for this patient encounter, including preparation, face-to-face counseling with the patient and coordination of care, and documentation of the encounter.  Eugene Garnet, MD  Division of Gynecologic Oncology  Department of Obstetrics and Gynecology  [facility]

## 2024-04-03 NOTE — Patient Instructions (Signed)
 It was good to see you today.  I do not see or feel any evidence of cancer recurrence on your exam.  I will see you for follow-up in 3 months. We will get your next MRI in August.  As always, if you develop any new and concerning symptoms before your next visit, please call to see me sooner.

## 2024-05-05 ENCOUNTER — Other Ambulatory Visit: Payer: Self-pay

## 2024-05-05 MED ORDER — ATORVASTATIN CALCIUM 80 MG PO TABS: 80.0000 mg | ORAL_TABLET | Freq: Every day | ORAL | 1 refills | Status: DC

## 2024-05-05 MED ORDER — AMLODIPINE BESYLATE 5 MG PO TABS: 5.0000 mg | ORAL_TABLET | Freq: Every day | ORAL | 1 refills | Status: DC

## 2024-05-13 ENCOUNTER — Ambulatory Visit (INDEPENDENT_AMBULATORY_CARE_PROVIDER_SITE_OTHER): Payer: Medicare Other | Admitting: *Deleted

## 2024-05-13 DIAGNOSIS — Z0000 Encounter for general adult medical examination without abnormal findings: Secondary | ICD-10-CM | POA: Diagnosis not present

## 2024-05-13 NOTE — Patient Instructions (Signed)
 [name] , Thank you for taking time to come for your Medicare Wellness Visit. I appreciate your ongoing commitment to your health goals. Please review the following plan we discussed and let me know if I can assist you in the future.   Screening recommendations/referrals: Colonoscopy: no longer required Mammogram: up to date Bone Density: Education provided Recommended yearly ophthalmology/optometry visit for glaucoma screening and checkup Recommended yearly dental visit for hygiene and checkup  Vaccinations: Influenza vaccine: up to date Pneumococcal vaccine: up to date Tdap vaccine: up to date Shingles vaccine: Education provided      Preventive Care 65 Years and Older, Female Preventive care refers to lifestyle choices and visits with your health care provider that can promote health and wellness. What does preventive care include? A yearly physical exam. This is also called an annual well check. Dental exams once or twice a year. Routine eye exams. Ask your health care provider how often you should have your eyes checked. Personal lifestyle choices, including: Daily care of your teeth and gums. Regular physical activity. Eating a healthy diet. Avoiding tobacco and drug use. Limiting alcohol use. Practicing safe sex. Taking low-dose aspirin every day. Taking vitamin and mineral supplements as recommended by your health care provider. What happens during an annual well check? The services and screenings done by your health care provider during your annual well check will depend on your age, overall health, lifestyle risk factors, and family history of disease. Counseling  Your health care provider may ask you questions about your: Alcohol use. Tobacco use. Drug use. Emotional well-being. Home and relationship well-being. Sexual activity. Eating habits. History of falls. Memory and ability to understand (cognition). Work and work Astronomer. Reproductive  health. Screening  You may have the following tests or measurements: Height, weight, and BMI. Blood pressure. Lipid and cholesterol levels. These may be checked every 5 years, or more frequently if you are over 13 years old. Skin check. Lung cancer screening. You may have this screening every year starting at age 58 if you have a 30-pack-year history of smoking and currently smoke or have quit within the past 15 years. Fecal occult blood test (FOBT) of the stool. You may have this test every year starting at age 67. Flexible sigmoidoscopy or colonoscopy. You may have a sigmoidoscopy every 5 years or a colonoscopy every 10 years starting at age 85. Hepatitis C blood test. Hepatitis B blood test. Sexually transmitted disease (STD) testing. Diabetes screening. This is done by checking your blood sugar (glucose) after you have not eaten for a while (fasting). You may have this done every 1-3 years. Bone density scan. This is done to screen for osteoporosis. You may have this done starting at age 57. Mammogram. This may be done every 1-2 years. Talk to your health care provider about how often you should have regular mammograms. Talk with your health care provider about your test results, treatment options, and if necessary, the need for more tests. Vaccines  Your health care provider may recommend certain vaccines, such as: Influenza vaccine. This is recommended every year. Tetanus, diphtheria, and acellular pertussis (Tdap, Td) vaccine. You may need a Td booster every 10 years. Zoster vaccine. You may need this after age 45. Pneumococcal 13-valent conjugate (PCV13) vaccine. One dose is recommended after age 37. Pneumococcal polysaccharide (PPSV23) vaccine. One dose is recommended after age 68. Talk to your health care provider about which screenings and vaccines you need and how often you need them. This information is  not intended to replace advice given to you by your health care provider.  Make sure you discuss any questions you have with your health care provider. Document Released: 01/13/2016 Document Revised: 09/05/2016 Document Reviewed: 10/18/2015 Elsevier Interactive Patient Education  2017 [employer].  Fall Prevention in the Home Falls can cause injuries. They can happen to people of all ages. There are many things you can do to make your home safe and to help prevent falls. What can I do on the outside of my home? Regularly fix the edges of walkways and driveways and fix any cracks. Remove anything that might make you trip as you walk through a door, such as a raised step or threshold. Trim any bushes or trees on the path to your home. Use bright outdoor lighting. Clear any walking paths of anything that might make someone trip, such as rocks or tools. Regularly check to see if handrails are loose or broken. Make sure that both sides of any steps have handrails. Any raised decks and porches should have guardrails on the edges. Have any leaves, snow, or ice cleared regularly. Use sand or salt on walking paths during winter. Clean up any spills in your garage right away. This includes oil or grease spills. What can I do in the bathroom? Use night lights. Install grab bars by the toilet and in the tub and shower. Do not use towel bars as grab bars. Use non-skid mats or decals in the tub or shower. If you need to sit down in the shower, use a plastic, non-slip stool. Keep the floor dry. Clean up any water  that spills on the floor as soon as it happens. Remove soap buildup in the tub or shower regularly. Attach bath mats securely with double-sided non-slip rug tape. Do not have throw rugs and other things on the floor that can make you trip. What can I do in the bedroom? Use night lights. Make sure that you have a light by your bed that is easy to reach. Do not use any sheets or blankets that are too big for your bed. They should not hang down onto the floor. Have a  firm chair that has side arms. You can use this for support while you get dressed. Do not have throw rugs and other things on the floor that can make you trip. What can I do in the kitchen? Clean up any spills right away. Avoid walking on wet floors. Keep items that you use a lot in easy-to-reach places. If you need to reach something above you, use a strong step stool that has a grab bar. Keep electrical cords out of the way. Do not use floor polish or wax that makes floors slippery. If you must use wax, use non-skid floor wax. Do not have throw rugs and other things on the floor that can make you trip. What can I do with my stairs? Do not leave any items on the stairs. Make sure that there are handrails on both sides of the stairs and use them. Fix handrails that are broken or loose. Make sure that handrails are as long as the stairways. Check any carpeting to make sure that it is firmly attached to the stairs. Fix any carpet that is loose or worn. Avoid having throw rugs at the top or bottom of the stairs. If you do have throw rugs, attach them to the floor with carpet tape. Make sure that you have a light switch at the top of the  stairs and the bottom of the stairs. If you do not have them, ask someone to add them for you. What else can I do to help prevent falls? Wear shoes that: Do not have high heels. Have rubber bottoms. Are comfortable and fit you well. Are closed at the toe. Do not wear sandals. If you use a stepladder: Make sure that it is fully opened. Do not climb a closed stepladder. Make sure that both sides of the stepladder are locked into place. Ask someone to hold it for you, if possible. Clearly mark and make sure that you can see: Any grab bars or handrails. First and last steps. Where the edge of each step is. Use tools that help you move around (mobility aids) if they are needed. These include: Canes. Walkers. Scooters. Crutches. Turn on the lights when you  go into a dark area. Replace any light bulbs as soon as they burn out. Set up your furniture so you have a clear path. Avoid moving your furniture around. If any of your floors are uneven, fix them. If there are any pets around you, be aware of where they are. Review your medicines with your doctor. Some medicines can make you feel dizzy. This can increase your chance of falling. Ask your doctor what other things that you can do to help prevent falls. This information is not intended to replace advice given to you by your health care provider. Make sure you discuss any questions you have with your health care provider. Document Released: 10/13/2009 Document Revised: 05/24/2016 Document Reviewed: 01/21/2015 Elsevier Interactive Patient Education  2017 [employer].

## 2024-05-13 NOTE — Progress Notes (Signed)
 Subjective:   [name] is a 74 y.o. female who presents for Medicare Annual (Subsequent) preventive examination.  Visit Complete: Virtual I connected with  Aden Honor on 05/13/24 by a audio enabled telemedicine application and verified that I am speaking with the correct person using two identifiers.  Patient Location: Home  Provider Location: Home Office  I discussed the limitations of evaluation and management by telemedicine. The patient expressed understanding and agreed to proceed.  Vital Signs: Because this visit was a virtual/telehealth visit, some criteria may be missing or patient reported. Any vitals not documented were not able to be obtained and vitals that have been documented are patient reported..  Cardiac Risk Factors include: advanced age (>34men, >88 women);diabetes mellitus;hypertension;obesity (BMI >30kg/m2)     Objective:     There were no vitals filed for this visit. There is no height or weight on file to calculate BMI.     05/13/2024   11:37 AM 07/31/2023    8:21 PM 06/06/2023    1:24 PM 05/09/2023    2:46 PM 02/04/2023   12:56 PM 07/30/2022    9:49 AM 12/11/2019    2:20 PM  Advanced Directives  Does Patient Have a Medical Advance Directive? No No No No No No No  Would patient like information on creating a medical advance directive? No - Patient declined No - Patient declined  No - Patient declined  No - Patient declined No - Patient declined    Current Medications (verified) Outpatient Encounter Medications as of 05/13/2024  Medication Sig   acetaminophen  (TYLENOL ) 500 MG tablet Take 1,000 mg by mouth every 6 (six) hours as needed for moderate pain.   allopurinol  (ZYLOPRIM ) 300 MG tablet Take 1 tablet (300 mg total) by mouth daily.   amLODipine  (NORVASC ) 5 MG tablet Take 1 tablet (5 mg total) by mouth daily.   atorvastatin  (LIPITOR ) 80 MG tablet Take 1 tablet (80 mg total) by mouth at bedtime.   carvedilol  (COREG ) 12.5 MG tablet Take 1 tablet (12.5  mg total) by mouth 2 (two) times daily with a meal.   Cholecalciferol (VITAMIN D3 PO) Take 1 capsule by mouth daily.   Coenzyme Q10 (COQ10) 100 MG CAPS Take 100 mg by mouth daily.   ELIQUIS  5 MG TABS tablet TAKE 1 TABLET BY MOUTH TWICE A DAY   famotidine (PEPCID) 20 MG tablet Take 20 mg by mouth daily.    furosemide  (LASIX ) 40 MG tablet Take 2 tablet in the morning.   GLUCOSAMINE-CHONDROITIN PO Take 1 tablet by mouth 2 (two) times daily.   insulin  NPH-regular Human (70-30) 100 UNIT/ML injection Inject 50-70 Units into the skin See admin instructions. 70 units In the morning and 50 units in the evening   KRILL OIL PO Take 1 capsule by mouth daily.   losartan  (COZAAR ) 50 MG tablet Take 1 tablet (50 mg total) by mouth daily.   meclizine  (ANTIVERT ) 12.5 MG tablet Take 1 tablet (12.5 mg total) by mouth 3 (three) times daily as needed for dizziness.   MELATONIN PO Take 10 mg by mouth at bedtime.   Multiple Vitamins-Minerals (MULTIVITAMIN WITH MINERALS) tablet Take 1 tablet by mouth daily.   potassium chloride  (KLOR-CON ) 10 MEQ tablet Take 1 tablet (10 mEq total) by mouth 2 (two) times daily. (Patient taking differently: Take 10 mEq by mouth daily.)   No facility-administered encounter medications on file as of 05/13/2024.    Allergies (verified) Lisinopril, Actos [pioglitazone], Metformin, and Metformin and related   History:  Past Medical History:  Diagnosis Date   Aspiration pneumonitis (HCC)    in hosp for SBO 08/2023   Asymptomatic cholelithiasis    Atrial fibrillation (HCC)    persistent (outpt monitoring 08/2022)   Bilateral lower extremity edema    Carpal tunnel syndrome    bilateral, had surgery on right hand   Chronic renal insufficiency, stage 3 (moderate) (HCC)    GFR 50s   Diabetes mellitus without complication (HCC)    Dr. Kathyanne Parkers   Diverticulosis    +hx of 'itis   Endometrial cancer [facility])    RT spring 2024   GERD (gastroesophageal reflux disease)    Gout    No problems  after getting on allopurinol    Heart murmur    soft, systolic   History of blood transfusion    History of colon polyps    History of iron deficiency    Hyperlipidemia    Hypertension    Osteoarthritis, multiple sites    esp knees   SBO (small bowel obstruction) (HCC)    adhesions (admission 07/2023)   Urinary incontinence    Past Surgical History:  Procedure Laterality Date   CARPAL TUNNEL RELEASE Bilateral    R 1991.  L 2024   Cataract  04/01/2024   Right   COLON SURGERY     COLONOSCOPY     x2.  Last was 2018   COLOSTOMY     COLOSTOMY REVERSAL     DILATATION & CURETTAGE/HYSTEROSCOPY WITH MYOSURE N/A 07/17/2019   Procedure: DILATATION & CURETTAGE/HYSTEROSCOPY WITH MYOSURE;  Surgeon: Belle Box, MD;  Location: [facility];  Service: Gynecology;  Laterality: N/A;   OOPHORECTOMY  2000   unilateral   PILONIDAL CYST EXCISION     ROBOTIC ASSISTED TOTAL HYSTERECTOMY WITH BILATERAL SALPINGO OOPHERECTOMY N/A 08/20/2019   Procedure: DIAGNSOTIC LAPAROSCOPY, DILATION AND CURETTAGE, IUD PLACEMENT ;  Surgeon: Alphonso Aschoff, MD;  Location: [facility];  Service: Gynecology;  Laterality: N/A;   SKIN CANCER EXCISION     TRANSTHORACIC ECHOCARDIOGRAM     08/2022 EF 50-55%, inc PA pressure, nl RV fxn, valves ok   Family History  Problem Relation Age of Onset   Hypertension Mother    Skin cancer Father    Diabetes Sister    Cancer Sister    Hypertension Sister    Cancer Sister    Diabetes Sister    Hearing loss Sister    Hypertension Sister    Kidney disease Sister    Cancer Sister    Diabetes Brother    Diabetes Maternal Grandfather    Stroke Maternal Grandfather    Diabetes Paternal Grandmother    Hearing loss Daughter    Social History   Socioeconomic History   Marital status: Married    Spouse name: Not on file   Number of children: Not on file   Years of education: Not on file   Highest education level: Bachelor's degree (e.g., BA, AB, BS)  Occupational History    Not on file  Tobacco Use   Smoking status: Former    Current packs/day: 0.00    Average packs/day: 3.0 packs/day for 22.0 years (66.0 ttl pk-yrs)    Types: Cigarettes    Start date: 06/07/1965    Quit date: 06/08/1987    Years since quitting: 36.9   Smokeless tobacco: Never  Vaping Use   Vaping status: Never Used  Substance and Sexual Activity   Alcohol use: Yes    Comment: twice per week  Drug use: No   Sexual activity: Yes    Partners: Male    Birth control/protection: Post-menopausal    Comment: married  Other Topics Concern   Not on file  Social History Narrative   Married, 2 children, 2 grandchildren.   Originally from [place] .   Education: Barista.   Occupation: Retired Airline pilot.   History of smoking.   Social Drivers of Corporate investment banker Strain: Low Risk  (05/13/2024)   Overall Financial Resource Strain (CARDIA)    Difficulty of Paying Living Expenses: Not hard at all  Food Insecurity: No Food Insecurity (05/13/2024)   Hunger Vital Sign    Worried About Running Out of Food in the Last Year: Never true    Ran Out of Food in the Last Year: Never true  Transportation Needs: No Transportation Needs (05/13/2024)   PRAPARE - Administrator, Civil Service (Medical): No    Lack of Transportation (Non-Medical): No  Physical Activity: Inactive (05/13/2024)   Exercise Vital Sign    Days of Exercise per Week: 0 days    Minutes of Exercise per Session: 0 min  Stress: No Stress Concern Present (05/13/2024)   [employer] of Occupational Health - Occupational Stress Questionnaire    Feeling of Stress : Not at all  Social Connections: Moderately Isolated (05/13/2024)   Social Connection and Isolation Panel [NHANES]    Frequency of Communication with Friends and Family: More than three times a week    Frequency of Social Gatherings with Friends and Family: Never    Attends Religious Services: Never    Database administrator or  Organizations: No    Attends Engineer, structural: Never    Marital Status: Married    Tobacco Counseling Counseling given: Not Answered   Clinical Intake:  Pre-visit preparation completed: Yes  Pain : No/denies pain     Diabetes: Yes CBG done?: No Did pt. bring in CBG monitor from home?: No  How often do you need to have someone help you when you read instructions, pamphlets, or other written materials from your doctor or pharmacy?: 1 - Never  Interpreter Needed?: No  Information entered by :: Kieth Pelt LPN   Activities of Daily Living    05/13/2024   11:40 AM 08/01/2023    1:06 PM  In your present state of health, do you have any difficulty performing the following activities:  Hearing? 0 0  Vision? 0 0  Difficulty concentrating or making decisions? 0 0  Walking or climbing stairs? 1 1  Dressing or bathing? 0 0  Doing errands, shopping? 1 1  Comment  needs help with wheel chair  Preparing Food and eating ? N   Using the Toilet? N   In the past six months, have you accidently leaked urine? Y   Do you have problems with loss of bowel control? N   Managing your Medications? N   Managing your Finances? N   Housekeeping or managing your Housekeeping? N     Patient Care Team: Shelvia Dick, MD as PCP - General (Family Medicine) Suzi Essex, MD as Consulting Physician (Gynecologic Oncology) Elmyra Haggard, MD as Consulting Physician (Cardiology) [facility], [facility] Gordy Lauber, MD as Consulting Physician (Endocrinology)  Indicate any recent Medical Services you may have received from other than [facility] providers in the past year (date may be approximate).     Assessment:    This is a routine wellness  examination for [name].  Hearing/Vision screen Hearing Screening - Comments:: No trouble hearing Vision Screening - Comments:: Up to date [facility] Cataract surgery 4-25 Right Eye   Goals Addressed             This  Visit's Progress    Patient Stated       Get healthier Loose weight       Depression Screen    05/13/2024   11:44 AM 03/16/2024    1:06 PM 12/12/2023   10:05 AM 09/11/2023   10:22 AM 08/14/2023    3:12 PM 05/09/2023    2:42 PM 05/09/2023    1:27 PM  PHQ 2/9 Scores  PHQ - 2 Score 1 1 1  0 2 0 0  PHQ- 9 Score 5 9 5 4 6  2     Fall Risk    05/13/2024   11:35 AM 09/11/2023   10:21 AM 09/10/2023    4:51 PM 05/09/2023    2:47 PM 05/09/2023    1:27 PM  Fall Risk   Falls in the past year? 1 0 0 0 0  Number falls in past yr: 0 0  0 0  Injury with Fall? 0 0  0 0  Risk for fall due to : Impaired mobility Impaired mobility  Impaired mobility;Impaired balance/gait Impaired mobility;Impaired balance/gait  Follow up Falls evaluation completed;Education provided;Falls prevention discussed   Falls evaluation completed Falls evaluation completed    MEDICARE RISK AT HOME: Medicare Risk at Home Any stairs in or around the home?: Yes If so, are there any without handrails?: No Home free of loose throw rugs in walkways, pet beds, electrical cords, etc?: Yes Adequate lighting in your home to reduce risk of falls?: Yes Life alert?: No Use of a cane, walker or w/c?: Yes Grab bars in the bathroom?: Yes Shower chair or bench in shower?: Yes Elevated toilet seat or a handicapped toilet?: Yes  TIMED UP AND GO:  Was the test performed?  No    Cognitive Function:        05/13/2024   11:39 AM 05/09/2023    2:49 PM  6CIT Screen  What Year? 0 points 0 points  What month? 0 points 0 points  What time? 0 points 0 points  Count back from 20 0 points 0 points  Months in reverse 0 points 0 points  Repeat phrase 0 points 0 points  Total Score 0 points 0 points    Immunizations Immunization History  Administered Date(s) Administered   Fluad Trivalent(High Dose 65+) 09/11/2023   Influenza, High Dose Seasonal PF 11/14/2015, 10/22/2017, 10/06/2018, 12/09/2019, 09/27/2020, 01/08/2022   Influenza,inj,Quad  PF,6+ Mos 10/19/2016   PFIZER Comirnaty(Gray Top)Covid-19 Tri-Sucrose Vaccine 05/17/2021   PFIZER(Purple Top)SARS-COV-2 Vaccination 02/09/2020, 03/01/2020, 09/27/2020   Pneumococcal Conjugate-13 06/21/2014   Pneumococcal Polysaccharide-23 07/27/2015   Tdap 06/21/2014   Zoster, Live 08/16/2014    TDAP status: Up to date  Flu Vaccine status: Up to date  Pneumococcal vaccine status: Up to date  Covid-19 vaccine status: Information provided on how to obtain vaccines.   Qualifies for Shingles Vaccine? Yes   Zostavax completed No   Shingrix Completed?: No.    Education has been provided regarding the importance of this vaccine. Patient has been advised to call insurance company to determine out of pocket expense if they have not yet received this vaccine. Advised may also receive vaccine at local pharmacy or Health Dept. Verbalized acceptance and understanding.  Screening Tests Health Maintenance  Topic Date Due  Diabetic kidney evaluation - Urine ACR  Never done   Hepatitis C Screening  Never done   Colonoscopy  Never done   DEXA SCAN  Never done   COVID-19 Vaccine (5 - 2024-25 season) 09/01/2023   FOOT EXAM  01/30/2024   HEMOGLOBIN A1C  02/01/2024   Zoster Vaccines- Shingrix (1 of 2) 06/16/2024 (Originally 08/17/1969)   DTaP/Tdap/Td (2 - Td or Tdap) 06/21/2024   OPHTHALMOLOGY EXAM  07/17/2024   INFLUENZA VACCINE  07/31/2024   MAMMOGRAM  09/30/2024   Diabetic kidney evaluation - eGFR measurement  03/16/2025   Medicare Annual Wellness (AWV)  05/13/2025   Pneumonia Vaccine 70+ Years old  Completed   HPV VACCINES  Aged Out   Meningococcal B Vaccine  Aged Out    Health Maintenance  Health Maintenance Due  Topic Date Due   Diabetic kidney evaluation - Urine ACR  Never done   Hepatitis C Screening  Never done   Colonoscopy  Never done   DEXA SCAN  Never done   COVID-19 Vaccine (5 - 2024-25 season) 09/01/2023   FOOT EXAM  01/30/2024   HEMOGLOBIN A1C  02/01/2024     Colorectal cancer screening: No longer required.   Mammogram status: Completed  . Repeat every year  Bone Density  Education provided  Lung Cancer Screening: (Low Dose CT Chest recommended if Age 57-80 years, 20 pack-year currently smoking OR have quit w/in 15years.) does not qualify.   Lung Cancer Screening Referral:   Additional Screening:  Hepatitis C Screening  never done  Vision Screening: Recommended annual ophthalmology exams for early detection of glaucoma and other disorders of the eye. Is the patient up to date with their annual eye exam?  Yes  Who is the provider or what is the name of the office in which the patient attends annual eye exams? [facility] If pt is not established with a provider, would they like to be referred to a provider to establish care? No .   Dental Screening: Recommended annual dental exams for proper oral hygiene  Nutrition Risk Assessment:  Has the patient had any N/V/D within the last 2 months?  No  Does the patient have any non-healing wounds?  No  Has the patient had any unintentional weight loss or weight gain?  No   Diabetes:  Is the patient diabetic?  Yes  If diabetic, was a CBG obtained today?  No  Did the patient bring in their glucometer from home?  No  How often do you monitor your CBG's? monitor.   Financial Strains and Diabetes Management:  Are you having any financial strains with the device, your supplies or your medication? No .  Does the patient want to be seen by Chronic Care Management for management of their diabetes?  No  Would the patient like to be referred to a Nutritionist or for Diabetic Management?  No   Diabetic Exams:  Diabetic Eye Exam: Completed .  Diabetic Foot Exam: Completed .Aaron Aas    Community Resource Referral / Chronic Care Management: CRR required this visit?  No   CCM required this visit?  No     Plan:     I have personally reviewed and noted the following in the patient's chart:   Medical  and social history Use of alcohol, tobacco or illicit drugs  Current medications and supplements including opioid prescriptions. Patient is not currently taking opioid prescriptions. Functional ability and status Nutritional status Physical activity Advanced directives List of other physicians Hospitalizations, surgeries,  and ER visits in previous 12 months Vitals Screenings to include cognitive, depression, and falls Referrals and appointments  In addition, I have reviewed and discussed with patient certain preventive protocols, quality metrics, and best practice recommendations. A written personalized care plan for preventive services as well as general preventive health recommendations were provided to patient.     Kieth Pelt, LPN   1/61/0960   After Visit Summary: (MyChart) Due to this being a telephonic visit, the after visit summary with patients personalized plan was offered to patient via MyChart   Nurse Notes:

## 2024-05-19 DIAGNOSIS — H35033 Hypertensive retinopathy, bilateral: Secondary | ICD-10-CM | POA: Diagnosis not present

## 2024-05-19 DIAGNOSIS — H43813 Vitreous degeneration, bilateral: Secondary | ICD-10-CM | POA: Diagnosis not present

## 2024-05-19 DIAGNOSIS — E119 Type 2 diabetes mellitus without complications: Secondary | ICD-10-CM | POA: Diagnosis not present

## 2024-05-19 DIAGNOSIS — H31091 Other chorioretinal scars, right eye: Secondary | ICD-10-CM | POA: Diagnosis not present

## 2024-05-19 DIAGNOSIS — Z961 Presence of intraocular lens: Secondary | ICD-10-CM | POA: Diagnosis not present

## 2024-06-10 ENCOUNTER — Ambulatory Visit (INDEPENDENT_AMBULATORY_CARE_PROVIDER_SITE_OTHER): Admitting: Podiatry

## 2024-06-10 ENCOUNTER — Encounter: Payer: Self-pay | Admitting: Podiatry

## 2024-06-10 DIAGNOSIS — E118 Type 2 diabetes mellitus with unspecified complications: Secondary | ICD-10-CM

## 2024-06-10 DIAGNOSIS — M79675 Pain in left toe(s): Secondary | ICD-10-CM | POA: Diagnosis not present

## 2024-06-10 DIAGNOSIS — B351 Tinea unguium: Secondary | ICD-10-CM | POA: Diagnosis not present

## 2024-06-10 DIAGNOSIS — M79674 Pain in right toe(s): Secondary | ICD-10-CM | POA: Diagnosis not present

## 2024-06-10 NOTE — Progress Notes (Signed)
 This patient returns to my office for at risk foot care.  This patient requires this care by a professional since this patient will be at risk due to having diabetes.  Her right lower leg is draining. This patient is unable to cut nails herself since the patient cannot reach her nails.These nails are painful walking and wearing shoes.  This patient presents for at risk foot care today.  General Appearance  Alert, conversant and in no acute stress.  Vascular  Dorsalis pedis and posterior tibial  pulses are palpable  bilaterally.  Capillary return is within normal limits  bilaterally. Temperature is within normal limits  bilaterally.  Neurologic  Senn-Weinstein monofilament wire test within normal limits  bilaterally. Muscle power within normal limits bilaterally.  Nails Thick disfigured discolored nails with subungual debris  from hallux to fifth toes bilaterally. No evidence of bacterial infection or drainage bilaterally.  Orthopedic  No limitations of motion  feet .  No crepitus or effusions noted.  No bony pathology or digital deformities noted.  Skin  normotropic skin with no porokeratosis noted bilaterally.  No signs of infections or ulcers noted.     Onychomycosis  Pain in right toes  Pain in left toes  Consent was obtained for treatment procedures.   Mechanical debridement of nails 1-5  bilaterally performed with a nail nipper.  Filed with dremel without incident.    Return office visit      12     weeks              Told patient to return for periodic foot care and evaluation due to potential at risk complications.   Helane Gunther DPM

## 2024-06-16 DIAGNOSIS — H35351 Cystoid macular degeneration, right eye: Secondary | ICD-10-CM | POA: Diagnosis not present

## 2024-06-17 ENCOUNTER — Encounter: Payer: Self-pay | Admitting: Family Medicine

## 2024-06-17 ENCOUNTER — Ambulatory Visit (INDEPENDENT_AMBULATORY_CARE_PROVIDER_SITE_OTHER): Admitting: Family Medicine

## 2024-06-17 VITALS — BP 98/62 | HR 61 | Temp 98.8°F | Ht 64.0 in | Wt 331.2 lb

## 2024-06-17 DIAGNOSIS — Z794 Long term (current) use of insulin: Secondary | ICD-10-CM | POA: Diagnosis not present

## 2024-06-17 DIAGNOSIS — L97821 Non-pressure chronic ulcer of other part of left lower leg limited to breakdown of skin: Secondary | ICD-10-CM | POA: Diagnosis not present

## 2024-06-17 DIAGNOSIS — N1831 Chronic kidney disease, stage 3a: Secondary | ICD-10-CM

## 2024-06-17 DIAGNOSIS — R600 Localized edema: Secondary | ICD-10-CM

## 2024-06-17 DIAGNOSIS — I5032 Chronic diastolic (congestive) heart failure: Secondary | ICD-10-CM | POA: Diagnosis not present

## 2024-06-17 DIAGNOSIS — I872 Venous insufficiency (chronic) (peripheral): Secondary | ICD-10-CM | POA: Diagnosis not present

## 2024-06-17 DIAGNOSIS — E1121 Type 2 diabetes mellitus with diabetic nephropathy: Secondary | ICD-10-CM

## 2024-06-17 LAB — COMPREHENSIVE METABOLIC PANEL WITH GFR
ALT: 12 U/L (ref 0–35)
AST: 23 U/L (ref 0–37)
Albumin: 4.1 g/dL (ref 3.5–5.2)
Alkaline Phosphatase: 123 U/L — ABNORMAL HIGH (ref 39–117)
BUN: 28 mg/dL — ABNORMAL HIGH (ref 6–23)
CO2: 31 meq/L (ref 19–32)
Calcium: 9.6 mg/dL (ref 8.4–10.5)
Chloride: 106 meq/L (ref 96–112)
Creatinine, Ser: 1.03 mg/dL (ref 0.40–1.20)
GFR: 53.07 mL/min — ABNORMAL LOW (ref >60.00)
Glucose, Bld: 100 mg/dL — ABNORMAL HIGH (ref 70–99)
Potassium: 4.5 meq/L (ref 3.5–5.1)
Sodium: 142 meq/L (ref 135–145)
Total Bilirubin: 0.7 mg/dL (ref 0.2–1.2)
Total Protein: 6.5 g/dL (ref 6.0–8.3)

## 2024-06-17 MED ORDER — CEPHALEXIN 500 MG PO CAPS: 500.0000 mg | ORAL_CAPSULE | Freq: Three times a day (TID) | ORAL | 0 refills | Status: DC

## 2024-06-17 NOTE — Progress Notes (Signed)
 OFFICE VISIT  06/17/2024  CC:  Chief Complaint  Patient presents with   Medical Management of Chronic Issues    Patient is a 74 y.o. female who presents for 18-month follow-up chronic diastolic heart failure, chronic renal insufficiency III, and HTN. A/P as of last visit: 1) Chronic diastolic HF, asymptomatic but activity very limited by her osteoarthritis. Stable.  Continue losartan  50 mg a day, Coreg  12.5 mg twice a day, and Lasix  40 mg daily.   2) CRI III. Avoid NSAIDs. Lytes/cr today.   3) HTN, stable on amlod 5mg  every day, coreg  12.5 bid, and losartan  50 every day. Lytes/cr today.   #4 hypercholesterolemia. Stable on a atorvastatin  80 mg a day. Lipid panel and hepatic panel today.   5.  DM per endo Dr. Kathyanne Parkers.  INTERIM HX: She feels well. She has noticed an increase in her legs swelling in the last few days.  She did not take her Lasix  yesterday or today because she has appointments.  She does not limit her sodium intake. Additionally, about a week ago she got a small spot on the anterior aspect of her left lower leg that is weeping. No pain or itching.   Past Medical History:  Diagnosis Date   Aspiration pneumonitis (HCC)    in hosp for SBO 08/2023   Asymptomatic cholelithiasis    Atrial fibrillation (HCC)    persistent (outpt monitoring 08/2022)   Bilateral lower extremity edema    Carpal tunnel syndrome    bilateral, had surgery on right hand   Chronic renal insufficiency, stage 3 (moderate) (HCC)    GFR 50s   Diabetes mellitus without complication (HCC)    Dr. Kathyanne Parkers   Diverticulosis    +hx of 'itis   Endometrial cancer [facility])    RT spring 2024   GERD (gastroesophageal reflux disease)    Gout    No problems after getting on allopurinol    Heart murmur    soft, systolic   History of blood transfusion    History of colon polyps    History of iron deficiency    Hyperlipidemia    Hypertension    Osteoarthritis, multiple sites    esp knees   SBO (small  bowel obstruction) (HCC)    adhesions (admission 07/2023)   Urinary incontinence     Past Surgical History:  Procedure Laterality Date   CARPAL TUNNEL RELEASE Bilateral    R 1991.  L 2024   Cataract  04/01/2024   Right   COLON SURGERY     COLONOSCOPY     x2.  Last was 2018   COLOSTOMY     COLOSTOMY REVERSAL     DILATATION & CURETTAGE/HYSTEROSCOPY WITH MYOSURE N/A 07/17/2019   Procedure: DILATATION & CURETTAGE/HYSTEROSCOPY WITH MYOSURE;  Surgeon: Belle Box, MD;  Location: [facility];  Service: Gynecology;  Laterality: N/A;   OOPHORECTOMY  2000   unilateral   PILONIDAL CYST EXCISION     ROBOTIC ASSISTED TOTAL HYSTERECTOMY WITH BILATERAL SALPINGO OOPHERECTOMY N/A 08/20/2019   Procedure: DIAGNSOTIC LAPAROSCOPY, DILATION AND CURETTAGE, IUD PLACEMENT ;  Surgeon: Alphonso Aschoff, MD;  Location: [facility];  Service: Gynecology;  Laterality: N/A;   SKIN CANCER EXCISION     TRANSTHORACIC ECHOCARDIOGRAM     08/2022 EF 50-55%, inc PA pressure, nl RV fxn, valves ok    Outpatient Medications Prior to Visit  Medication Sig Dispense Refill   acetaminophen  (TYLENOL ) 500 MG tablet Take 1,000 mg by mouth every 6 (six) hours as needed  for moderate pain.     allopurinol  (ZYLOPRIM ) 300 MG tablet Take 1 tablet (300 mg total) by mouth daily. 90 tablet 1   amLODipine  (NORVASC ) 5 MG tablet Take 1 tablet (5 mg total) by mouth daily. 90 tablet 1   atorvastatin  (LIPITOR ) 80 MG tablet Take 1 tablet (80 mg total) by mouth at bedtime. 90 tablet 1   carvedilol  (COREG ) 12.5 MG tablet Take 1 tablet (12.5 mg total) by mouth 2 (two) times daily with a meal. 180 tablet 3   Cholecalciferol (VITAMIN D3 PO) Take 1 capsule by mouth daily.     Coenzyme Q10 (COQ10) 100 MG CAPS Take 100 mg by mouth daily.     Continuous Glucose Receiver (DEXCOM G7 RECEIVER) DEVI . . change sensor every 10 days on insulin  for 90 days     Continuous Glucose Sensor (DEXCOM G7 SENSOR) MISC . Aaron Aas change sensor every 10 days on insulin  for  90 days     ELIQUIS  5 MG TABS tablet TAKE 1 TABLET BY MOUTH TWICE A DAY 180 tablet 3   EMBECTA INSULIN  SYR ULTRAFINE 31G X 15/64 1 ML MISC Inject 1 Syringe into the skin 2 (two) times daily.     famotidine (PEPCID) 20 MG tablet Take 20 mg by mouth daily.      furosemide  (LASIX ) 40 MG tablet Take 2 tablet in the morning. 180 tablet 1   GLUCOSAMINE-CHONDROITIN PO Take 1 tablet by mouth 2 (two) times daily.     insulin  NPH-regular Human (70-30) 100 UNIT/ML injection Inject 50-70 Units into the skin See admin instructions. 70 units In the morning and 50 units in the evening     KRILL OIL PO Take 1 capsule by mouth daily.     losartan  (COZAAR ) 50 MG tablet Take 1 tablet (50 mg total) by mouth daily. 90 tablet 3   meclizine  (ANTIVERT ) 12.5 MG tablet Take 1 tablet (12.5 mg total) by mouth 3 (three) times daily as needed for dizziness. 30 tablet 0   MELATONIN PO Take 10 mg by mouth at bedtime.     Multiple Vitamins-Minerals (MULTIVITAMIN WITH MINERALS) tablet Take 1 tablet by mouth daily.     potassium chloride  (KLOR-CON ) 10 MEQ tablet Take 1 tablet (10 mEq total) by mouth 2 (two) times daily. (Patient taking differently: Take 10 mEq by mouth daily.) 180 tablet 3   No facility-administered medications prior to visit.    Allergies  Allergen Reactions   Lisinopril Cough    Other reaction(s): cough, Not available   Actos [Pioglitazone]     Unknown reaction   Metformin Other (See Comments)   Metformin And Related     Gas     Review of Systems As per HPI  PE:    06/17/2024    1:10 PM 04/03/2024    3:43 PM 04/03/2024    3:25 PM  Vitals with BMI  Height 5' 4    Weight 331 lbs 3 oz  324 lbs 10 oz  BMI 56.82  55.69  Systolic 98 152 154  Diastolic 62 74 71  Pulse 61  68     Physical Exam  General: Alert and well-appearing. Cardiovascular: Irregularly irregular rhythm, distant S1 and S2, no audible murmur. Lungs are clear bilaterally, breathing is nonlabored. Extremities show tense skin  but no pitting edema. She has fibrotic changes extensively in the lower legs.  Left lower leg anterior surface with about a 1 cm area of superficial desquamation with a little bit of weeping present.  There is a proximately fist size area of mild erythema surrounding this.  No tenderness.  No warmth.  LABS:  Last CBC Lab Results  Component Value Date   WBC 5.5 03/16/2024   HGB 13.3 03/16/2024   HCT 39.1 03/16/2024   MCV 99.7 03/16/2024   MCH 32.3 08/04/2023   RDW 15.4 03/16/2024   PLT 204.0 03/16/2024   Lab Results  Component Value Date   IRON 89 03/16/2024   TIBC 344 03/16/2024   FERRITIN 38 03/16/2024   Last metabolic panel Lab Results  Component Value Date   GLUCOSE 185 (H) 03/16/2024   NA 142 03/16/2024   K 4.1 03/16/2024   CL 105 03/16/2024   CO2 28 03/16/2024   BUN 27 (H) 03/16/2024   CREATININE 1.10 03/16/2024   GFR 49.13 (L) 03/16/2024   CALCIUM  9.9 03/16/2024   PROT 6.4 03/16/2024   ALBUMIN 4.3 03/16/2024   BILITOT 0.5 03/16/2024   ALKPHOS 138 (H) 03/16/2024   AST 17 03/16/2024   ALT 14 03/16/2024   ANIONGAP 12 08/06/2023   Last lipids Lab Results  Component Value Date   CHOL 148 03/16/2024   HDL 37.90 (L) 03/16/2024   LDLCALC 61 03/16/2024   TRIG 244.0 (H) 03/16/2024   CHOLHDL 4 03/16/2024   Last hemoglobin A1c Lab Results  Component Value Date   HGBA1C 5.9 (H) 08/01/2023   Last thyroid  functions Lab Results  Component Value Date   TSH 6.020 (H) 09/10/2022   IMPRESSION AND PLAN:  1) Chronic diastolic HF, asymptomatic but activity very limited by her osteoarthritis. Stable.  Continue losartan  50 mg a day, Coreg  12.5 mg twice a day, and Lasix  40 mg daily. Electrolytes and creatinine today.  2) CRI III. Avoid NSAIDs. Lytes/cr today.   3) HTN, stable on amlod 5mg  every day, coreg  12.5 bid, and losartan  50 every day. Lytes/cr today.   #4 hypercholesterolemia. Stable on a atorvastatin  80 mg a day. LDL was 61 about 3 months ago. Plan  repeat lipids in 3 months.   #5 chronic bilateral lower extremity edema. She has a small area of superficial ulceration/weeping and increased erythema on the anterior aspect of the left lower leg. We discussed need for limiting her sodium intake significantly, continuing elevating her legs, get back on Lasix  80 mg every morning, and cover the small wound with wet-to-dry dressing daily. Additionally, I will give her Keflex 500 mg 3 times daily x 7 days.  6.  DM per endo Dr. Kathyanne Parkers.  An After Visit Summary was printed and given to the patient.  FOLLOW UP: Return in about 2 weeks (around 07/01/2024) for Follow-up left leg.  Signed:  Arletha Lady, MD           06/17/2024

## 2024-06-18 ENCOUNTER — Ambulatory Visit: Payer: Self-pay | Admitting: Family Medicine

## 2024-07-01 ENCOUNTER — Ambulatory Visit (INDEPENDENT_AMBULATORY_CARE_PROVIDER_SITE_OTHER): Admitting: Family Medicine

## 2024-07-01 ENCOUNTER — Encounter: Payer: Self-pay | Admitting: Family Medicine

## 2024-07-01 VITALS — BP 110/65 | HR 77 | Temp 98.7°F | Ht 64.0 in | Wt 327.2 lb

## 2024-07-01 DIAGNOSIS — R600 Localized edema: Secondary | ICD-10-CM | POA: Diagnosis not present

## 2024-07-01 NOTE — Progress Notes (Signed)
 OFFICE VISIT  07/01/2024  CC:  Chief Complaint  Patient presents with   Follow-up    Left leg; 2 week f/u    Patient is a 74 y.o. female who presents for 2-week follow-up left leg ulceration. A/P as of last visit:  chronic bilateral lower extremity edema. She has a small area of superficial ulceration/weeping and increased erythema on the anterior aspect of the left lower leg. We discussed need for limiting her sodium intake significantly, continuing elevating her legs, get back on Lasix  80 mg every morning, and cover the small wound with wet-to-dry dressing daily. Additionally, I will give her Keflex  500 mg 3 times daily x 7 days.  INTERIM HX: Labs were all stable at last visit.  Feeling well, no acute concerns.  She feels like her legs are less swollen and she has a couple of 1 to 2 mm dry superficial ulcerations on each lower leg.  Doing well with limitation of sodium intake. She has lost about 4 pounds.  Past Medical History:  Diagnosis Date   Aspiration pneumonitis (HCC)    in hosp for SBO 08/2023   Asymptomatic cholelithiasis    Atrial fibrillation (HCC)    persistent (outpt monitoring 08/2022)   Bilateral lower extremity edema    Carpal tunnel syndrome    bilateral, had surgery on right hand   Chronic renal insufficiency, stage 3 (moderate) (HCC)    GFR 50s   Diabetes mellitus without complication (HCC)    Dr. Faythe   Diverticulosis    +hx of 'itis   Endometrial cancer [facility])    RT spring 2024   GERD (gastroesophageal reflux disease)    Gout    No problems after getting on allopurinol    Heart murmur    soft, systolic   History of blood transfusion    History of colon polyps    History of iron deficiency    Hyperlipidemia    Hypertension    Osteoarthritis, multiple sites    esp knees   SBO (small bowel obstruction) (HCC)    adhesions (admission 07/2023)   Urinary incontinence     Past Surgical History:  Procedure Laterality Date   CARPAL TUNNEL RELEASE  Bilateral    R 1991.  L 2024   Cataract  04/01/2024   Right   COLON SURGERY     COLONOSCOPY     x2.  Last was 2018   COLOSTOMY     COLOSTOMY REVERSAL     DILATATION & CURETTAGE/HYSTEROSCOPY WITH MYOSURE N/A 07/17/2019   Procedure: DILATATION & CURETTAGE/HYSTEROSCOPY WITH MYOSURE;  Surgeon: Marget Lenis, MD;  Location: [facility];  Service: Gynecology;  Laterality: N/A;   OOPHORECTOMY  2000   unilateral   PILONIDAL CYST EXCISION     ROBOTIC ASSISTED TOTAL HYSTERECTOMY WITH BILATERAL SALPINGO OOPHERECTOMY N/A 08/20/2019   Procedure: DIAGNSOTIC LAPAROSCOPY, DILATION AND CURETTAGE, IUD PLACEMENT ;  Surgeon: Eloy Herring, MD;  Location: [facility];  Service: Gynecology;  Laterality: N/A;   SKIN CANCER EXCISION     TRANSTHORACIC ECHOCARDIOGRAM     08/2022 EF 50-55%, inc PA pressure, nl RV fxn, valves ok    Outpatient Medications Prior to Visit  Medication Sig Dispense Refill   acetaminophen  (TYLENOL ) 500 MG tablet Take 1,000 mg by mouth every 6 (six) hours as needed for moderate pain.     allopurinol  (ZYLOPRIM ) 300 MG tablet Take 1 tablet (300 mg total) by mouth daily. 90 tablet 1   amLODipine  (NORVASC ) 5 MG tablet Take 1  tablet (5 mg total) by mouth daily. 90 tablet 1   atorvastatin  (LIPITOR ) 80 MG tablet Take 1 tablet (80 mg total) by mouth at bedtime. 90 tablet 1   carvedilol  (COREG ) 12.5 MG tablet Take 1 tablet (12.5 mg total) by mouth 2 (two) times daily with a meal. 180 tablet 3   Cholecalciferol (VITAMIN D3 PO) Take 1 capsule by mouth daily.     Coenzyme Q10 (COQ10) 100 MG CAPS Take 100 mg by mouth daily.     Continuous Glucose Receiver (DEXCOM G7 RECEIVER) DEVI . . change sensor every 10 days on insulin  for 90 days     Continuous Glucose Sensor (DEXCOM G7 SENSOR) MISC . SABRA change sensor every 10 days on insulin  for 90 days     ELIQUIS  5 MG TABS tablet TAKE 1 TABLET BY MOUTH TWICE A DAY 180 tablet 3   EMBECTA INSULIN  SYR ULTRAFINE 31G X 15/64 1 ML MISC Inject 1 Syringe into  the skin 2 (two) times daily.     famotidine (PEPCID) 20 MG tablet Take 20 mg by mouth daily.      furosemide  (LASIX ) 40 MG tablet Take 2 tablet in the morning. 180 tablet 1   GLUCOSAMINE-CHONDROITIN PO Take 1 tablet by mouth 2 (two) times daily.     insulin  NPH-regular Human (70-30) 100 UNIT/ML injection Inject 50-70 Units into the skin See admin instructions. 70 units In the morning and 50 units in the evening     KRILL OIL PO Take 1 capsule by mouth daily.     losartan  (COZAAR ) 50 MG tablet Take 1 tablet (50 mg total) by mouth daily. 90 tablet 3   meclizine  (ANTIVERT ) 12.5 MG tablet Take 1 tablet (12.5 mg total) by mouth 3 (three) times daily as needed for dizziness. 30 tablet 0   MELATONIN PO Take 10 mg by mouth at bedtime.     Multiple Vitamins-Minerals (MULTIVITAMIN WITH MINERALS) tablet Take 1 tablet by mouth daily.     potassium chloride  (KLOR-CON ) 10 MEQ tablet Take 1 tablet (10 mEq total) by mouth 2 (two) times daily. (Patient taking differently: Take 10 mEq by mouth daily.) 180 tablet 3   cephALEXin  (KEFLEX ) 500 MG capsule Take 1 capsule (500 mg total) by mouth 3 (three) times daily. (Patient not taking: Reported on 07/01/2024) 21 capsule 0   No facility-administered medications prior to visit.    Allergies  Allergen Reactions   Lisinopril Cough    Other reaction(s): cough, Not available   Actos [Pioglitazone]     Unknown reaction   Metformin Other (See Comments)   Metformin And Related     Gas     Review of Systems As per HPI  PE:    07/01/2024    1:43 PM 06/17/2024    1:10 PM 04/03/2024    3:43 PM  Vitals with BMI  Height 5' 4 5' 4   Weight 327 lbs 3 oz 331 lbs 3 oz   BMI 56.14 56.82   Systolic 110 98 152  Diastolic 65 62 74  Pulse 77 61      Physical Exam  General: Alert and well-appearing. Legs: No erythema.  No pitting edema.  She has fibrotic lymphedematous skin changes on both lower legs.  She has 2-3 very small superficial ulcerations on each lower legs  that are dry.  LABS:  Last metabolic panel Lab Results  Component Value Date   GLUCOSE 100 (H) 06/17/2024   NA 142 06/17/2024   K 4.5 Hemolysis  seen... 06/17/2024   CL 106 06/17/2024   CO2 31 06/17/2024   BUN 28 (H) 06/17/2024   CREATININE 1.03 06/17/2024   GFR 53.07 (L) 06/17/2024   CALCIUM  9.6 06/17/2024   PROT 6.5 06/17/2024   ALBUMIN 4.1 06/17/2024   BILITOT 0.7 06/17/2024   ALKPHOS 123 (H) 06/17/2024   AST 23 06/17/2024   ALT 12 06/17/2024   ANIONGAP 12 08/06/2023   Lab Results  Component Value Date   WBC 5.5 03/16/2024   HGB 13.3 03/16/2024   HCT 39.1 03/16/2024   MCV 99.7 03/16/2024   PLT 204.0 03/16/2024   IMPRESSION AND PLAN:  #1 chronic bilateral lower extremity edema/lymphedema. Stable.  No significant/worrisome stasis ulcers. No sign of infection. She will continue sodium restriction (she has done very well with this lately). Continue Lasix  80 mg daily. Signs/symptoms to call or return for were reviewed and pt expressed understanding.  An After Visit Summary was printed and given to the patient.  FOLLOW UP: No follow-ups on file.  Signed:  Gerlene Hockey, MD           07/01/2024

## 2024-07-27 DIAGNOSIS — D7589 Other specified diseases of blood and blood-forming organs: Secondary | ICD-10-CM | POA: Diagnosis not present

## 2024-07-27 DIAGNOSIS — N1831 Chronic kidney disease, stage 3a: Secondary | ICD-10-CM | POA: Diagnosis not present

## 2024-07-27 DIAGNOSIS — E785 Hyperlipidemia, unspecified: Secondary | ICD-10-CM | POA: Diagnosis not present

## 2024-07-27 DIAGNOSIS — D509 Iron deficiency anemia, unspecified: Secondary | ICD-10-CM | POA: Diagnosis not present

## 2024-07-27 DIAGNOSIS — Z794 Long term (current) use of insulin: Secondary | ICD-10-CM | POA: Diagnosis not present

## 2024-07-27 DIAGNOSIS — E1122 Type 2 diabetes mellitus with diabetic chronic kidney disease: Secondary | ICD-10-CM | POA: Diagnosis not present

## 2024-07-31 ENCOUNTER — Telehealth: Payer: Self-pay | Admitting: Internal Medicine

## 2024-07-31 DIAGNOSIS — I4891 Unspecified atrial fibrillation: Secondary | ICD-10-CM

## 2024-07-31 NOTE — Telephone Encounter (Signed)
*  STAT* If patient is at the pharmacy, call can be transferred to refill team.   1. Which medications need to be refilled? (please list name of each medication and dose if known)   ELIQUIS  5 MG TABS tablet   2. Which pharmacy/location (including street and city if local pharmacy) is medication to be sent to? [facility] - [place], [place] - [address] Phone: [phone]  Fax: [phone]     3. Do they need a 30 day or 90 day supply? 90

## 2024-08-04 ENCOUNTER — Other Ambulatory Visit: Payer: Self-pay

## 2024-08-04 DIAGNOSIS — I10 Essential (primary) hypertension: Secondary | ICD-10-CM

## 2024-08-04 MED ORDER — APIXABAN 5 MG PO TABS: 5.0000 mg | ORAL_TABLET | Freq: Two times a day (BID) | ORAL | 1 refills | Status: AC

## 2024-08-04 MED ORDER — CARVEDILOL 12.5 MG PO TABS: 12.5000 mg | ORAL_TABLET | Freq: Two times a day (BID) | ORAL | 0 refills | Status: DC

## 2024-08-04 NOTE — Telephone Encounter (Signed)
 Eliquis  5mg  refill request received. Patient is 74 years old, weight-148.4kg, Crea-1.03 on 06/17/24, Diagnosis-Afib, and last seen by Dr. Okey on 11/12/23. Dose is appropriate based on dosing criteria. Will send in refill to requested pharmacy.

## 2024-08-07 ENCOUNTER — Encounter: Payer: Self-pay | Admitting: Gynecologic Oncology

## 2024-08-07 ENCOUNTER — Inpatient Hospital Stay: Attending: Gynecologic Oncology | Admitting: Gynecologic Oncology

## 2024-08-07 VITALS — BP 140/80 | HR 80 | Temp 98.0°F | Resp 20 | Wt 334.0 lb

## 2024-08-07 DIAGNOSIS — Z6841 Body Mass Index (BMI) 40.0 and over, adult: Secondary | ICD-10-CM | POA: Insufficient documentation

## 2024-08-07 DIAGNOSIS — Z803 Family history of malignant neoplasm of breast: Secondary | ICD-10-CM | POA: Insufficient documentation

## 2024-08-07 DIAGNOSIS — Z800 Family history of malignant neoplasm of digestive organs: Secondary | ICD-10-CM | POA: Insufficient documentation

## 2024-08-07 DIAGNOSIS — Z923 Personal history of irradiation: Secondary | ICD-10-CM | POA: Diagnosis not present

## 2024-08-07 DIAGNOSIS — Z87891 Personal history of nicotine dependence: Secondary | ICD-10-CM | POA: Diagnosis not present

## 2024-08-07 DIAGNOSIS — Z8049 Family history of malignant neoplasm of other genital organs: Secondary | ICD-10-CM | POA: Insufficient documentation

## 2024-08-07 DIAGNOSIS — N3946 Mixed incontinence: Secondary | ICD-10-CM | POA: Diagnosis not present

## 2024-08-07 DIAGNOSIS — Z8542 Personal history of malignant neoplasm of other parts of uterus: Secondary | ICD-10-CM | POA: Insufficient documentation

## 2024-08-07 DIAGNOSIS — Z808 Family history of malignant neoplasm of other organs or systems: Secondary | ICD-10-CM | POA: Insufficient documentation

## 2024-08-07 DIAGNOSIS — N393 Stress incontinence (female) (male): Secondary | ICD-10-CM

## 2024-08-07 DIAGNOSIS — N3941 Urge incontinence: Secondary | ICD-10-CM

## 2024-08-07 DIAGNOSIS — C541 Malignant neoplasm of endometrium: Secondary | ICD-10-CM

## 2024-08-07 NOTE — Progress Notes (Signed)
 Gynecologic Oncology Return Clinic Visit  08/07/24  Reason for Visit: surveillance   Treatment History: Patient reports a history of postmenopausal bleeding since July 2020.  Is been persistent low low level bleeding, spotting but no heavy passage of clots.  She reported this to her gynecologist who ordered a transvaginal ultrasound on June 24, 2019.  This revealed a uterus measuring 6.7 x 3.4 x 5.5 cm with an endometrial thickness of 1.4 cm.  The ovaries were not visualized.   She was taken to the operating room for a curettage and polypectomy on July 17, 2019 which revealed FIGO grade 1 endometrioid adenocarcinoma.   Patient has a complex medical history.  She is morbidly obese with a BMI of 48 kg/m.  She experienced ruptured diverticulitis in 1997 which required a diverting colostomy and Hartman's pouch.  She 6 weeks later she was taken to the operating room for laparotomy and reversal of Hartman's.  She reports that the surgeon described her abdomen is looking like a gallon of superglue had been poured inside her.  It was an extensively long surgery.  She is unsure if additional bowel resections were necessary.  She had a 1 month hospitalization following this reversal of colostomy.  She developed a bowel obstruction during that hospitalization and required reoperation to resolve this.   In 2000 she developed a growth on an ovary (she cannot remember which side).  For this she underwent laparotomy and oophorectomy.  This surgery was performed in [place] .  She was planning to have a hysterectomy at that procedure, however the surgeon aborted plans for hysterectomy and performed only oophorectomy after encountering significant adhesive disease.  She states that he told her surgery would have needed to the last 6 to 8 hours in order to perform hysterectomy due to the adhesive disease.   The patient has type 2 diabetes mellitus and has had this for greater than 30 years.  She reports having no  endorgan disease.  Her hemoglobin A1c was 5.4% in May 2020.   Her family history is significant for a father with skin cancer.  She has 3 sisters with cancer including one with endometrial cancer who is obese, a sister with stage II 3 breast cancer, and multiple myeloma.   She has had 2 prior vaginal deliveries.   She lives in [place].   On August 20, 2019 she underwent an attempt at a robotic assisted total hysterectomy BSO and sentinel lymph node biopsy.  The surgery was aborted after laparoscopic entry into the left upper quadrant encountered dense adhesions throughout the entirety of the abdomen which included dense colonic and small bowel adhesions to the anterior abdominal wall.  The density of these adhesions and their comprehensive nature across the entirety of her abdominal wall meant that it was not possible to place additional laparoscopic ports to perform adhesiolysis.  She would have required a laparotomy in order to achieve hysterectomy, and the patient had declined this preoperatively, and had elected to proceed with D&C with progestin releasing IUD placement if hysterectomy was not possible via minimally invasive surgical route.  Therefore we aborted the laparoscopic procedure and performed a D&C procedure with placement of a progestin releasing IUD.   Final pathology from the [facility] specimen showed adenocarcinoma favor endometrial primary.  It was difficult to grade the specimen due to his fragmented nature.   Repeat biopsy (office) sampling in December, 2020 showed complete clinical response with no residual carcinoma.    The patient then transferred care to  Dr. Bonnita in [place] .  Repeat endometrial biopsy on 03/23/2021 showed residual endometrioid adenocarcinoma.   Repeat biopsy on 06/22/2021 showed FIGO grade 2 endometrioid adenocarcinoma.  There is a comment that her Megace , which must of been started prior to this, was increased to 160 mg twice daily.   Endometrial biopsy on  09/22/2021 showed residual endometrioid adenocarcinoma, FIGO grade 1-2, in the background of endometrium with exogenous hormone effect.   Pelvic MRI on 12/05/2021 noted a 3.3 cm simple left ovarian cyst, colonic diverticulosis, anterior endometrial mass in the uterine body with an IUD adjacent, suspicious for endometrial cancer.   Repeat endometrial biopsy on 02/01/2022 showed residual endometrial carcinoma, FIGO grade 1.   Staging CT of the chest, abdomen, and pelvis on 02/15/2022 revealed no adenopathy, no aggressive appearing osseous lesions.  Diastases recti with superimposed shallow ventral wall hernia measuring 1 x 7 cm.  Cholelithiasis noted without acute cholecystitis.   03/27/22: IUD removed.   Patient proceeded with definitive radiation therapy from 03/06/2022 - 04/18/2022.  She was treated with EBRT (4500 cGy in 25 fractions) utilizing IMRT treatment technique.  This was followed by weekly T&O HDR treatment to a total dose of 3000 cGy in 5 fractions.  Total treatment dose was 7500 cGy.  Treatment was tolerated well other than fatigue and mild loose stools.   Posttreatment pelvic MRI on 06/12/2022 showed a slightly less bulky uterus.  Significant decrease in the endometrium compared to previous exam with endometrium now measuring 4 mm in thickness (previously was 2.7 cm).  Slight heterogenous signal in the endometrium.  No adenopathy.   She was last seen for follow-up on 06/20/2022 with radiation oncology.  She was doing well at that time without any vaginal bleeding or cramping.   MRI pelvis on 12/10/2022: Loss of normal differentiation between endometrium and zonal anatomy in general at the fundus and mid uterus.  Diffusion properties in this area are nondescript with no focal area of marked restricted diffusion.  Imaging findings are nonspecific although could reflect baseline treatment changes.  Small volume ascites may be reactive.  Report was addended after prior imaging was obtained.  Loss of  defined endometrium could be seen following radiotherapy.  Imaging findings remain more suggestive of posttreatment related changes from radiation but short interval follow-up suggested.   MRI pelvis on 03/09/2023 reveals unchanged appearance of the uterus, with diffuse loss of endometrial definition and mild T2 hyperintensity of the myometrium.  Normal appearance of the cervix and endocervical canal.  Unchanged left ovarian cyst measuring up to 3.9 cm, similar in size dating back to 2017 with no further follow-up recommended.  Unchanged small volume ascites.  Thought to most likely reflect chronic, posttreatment/postradiation appearance of the endometrium and uterus.  No interval change or other suspicious findings to suggest recurrence or residual disease.   Admitted in 07/2023 with SBO, resolved with conservative management.   MRI pelvis 08/2023 with stable exam, no evidence of local recurrence or metastatic disease.  Continued visualization of loss of endometrial/myometrial interface, likely secondary to treatment.  Stable 4.2 cm cystic structure in the left adnexa.   MRI pelvis 02/2024 with unchanged posttreatment appearance of the uterus, no evidence of internal endometrial structure mass.  No adenopathy or metastatic disease in the pelvis.  Unchanged simple appearing fluid signal cyst in the left ovary.  Diffuse thickening of urinary bladder wall, similar to prior exam.  Small volume free fluid in the pelvis.  Interval History: Doing well.  Denies any vaginal bleeding.  Continues to endorse urinary urgency with associated incontinence as well as stress incontinence.  Denies any abdominal or pelvic pain.  Past Medical/Surgical History: Past Medical History:  Diagnosis Date   Aspiration pneumonitis (HCC)    in hosp for SBO 08/2023   Asymptomatic cholelithiasis    Atrial fibrillation (HCC)    persistent (outpt monitoring 08/2022)   Bilateral lower extremity edema    Carpal tunnel syndrome     bilateral, had surgery on right hand   Chronic renal insufficiency, stage 3 (moderate) (HCC)    GFR 50s   Diabetes mellitus without complication (HCC)    Dr. Faythe   Diverticulosis    +hx of 'itis   Endometrial cancer [facility])    RT spring 2024   GERD (gastroesophageal reflux disease)    Gout    No problems after getting on allopurinol    Heart murmur    soft, systolic   History of blood transfusion    History of colon polyps    History of iron deficiency    Hyperlipidemia    Hypertension    Osteoarthritis, multiple sites    esp knees   SBO (small bowel obstruction) (HCC)    adhesions (admission 07/2023)   Urinary incontinence     Past Surgical History:  Procedure Laterality Date   CARPAL TUNNEL RELEASE Bilateral    R 1991.  L 2024   Cataract  04/01/2024   Right   COLON SURGERY     COLONOSCOPY     x2.  Last was 2018   COLOSTOMY     COLOSTOMY REVERSAL     DILATATION & CURETTAGE/HYSTEROSCOPY WITH MYOSURE N/A 07/17/2019   Procedure: DILATATION & CURETTAGE/HYSTEROSCOPY WITH MYOSURE;  Surgeon: Marget Lenis, MD;  Location: [facility];  Service: Gynecology;  Laterality: N/A;   OOPHORECTOMY  2000   unilateral   PILONIDAL CYST EXCISION     ROBOTIC ASSISTED TOTAL HYSTERECTOMY WITH BILATERAL SALPINGO OOPHERECTOMY N/A 08/20/2019   Procedure: DIAGNSOTIC LAPAROSCOPY, DILATION AND CURETTAGE, IUD PLACEMENT ;  Surgeon: Eloy Herring, MD;  Location: [facility];  Service: Gynecology;  Laterality: N/A;   SKIN CANCER EXCISION     TRANSTHORACIC ECHOCARDIOGRAM     08/2022 EF 50-55%, inc PA pressure, nl RV fxn, valves ok    Family History  Problem Relation Age of Onset   Hypertension Mother    Skin cancer Father    Diabetes Sister    Cancer Sister    Hypertension Sister    Cancer Sister    Diabetes Sister    Hearing loss Sister    Hypertension Sister    Kidney disease Sister    Cancer Sister    Diabetes Brother    Diabetes Maternal Grandfather    Stroke Maternal Grandfather     Diabetes Paternal Grandmother    Hearing loss Daughter     Social History   Socioeconomic History   Marital status: Married    Spouse name: Not on file   Number of children: Not on file   Years of education: Not on file   Highest education level: Bachelor's degree (e.g., BA, AB, BS)  Occupational History   Not on file  Tobacco Use   Smoking status: Former    Current packs/day: 0.00    Average packs/day: 3.0 packs/day for 22.0 years (66.0 ttl pk-yrs)    Types: Cigarettes    Start date: 06/07/1965    Quit date: 06/08/1987    Years since quitting: 37.1   Smokeless tobacco: Never  Vaping Use   Vaping status: Never Used  Substance and Sexual Activity   Alcohol use: Yes    Comment: twice per week   Drug use: No   Sexual activity: Yes    Partners: Male    Birth control/protection: Post-menopausal    Comment: married  Other Topics Concern   Not on file  Social History Narrative   Married, 2 children, 2 grandchildren.   Originally from [place] .   Education: Barista.   Occupation: Retired Airline pilot.   History of smoking.   Social Drivers of Corporate investment banker Strain: Low Risk  (05/13/2024)   Overall Financial Resource Strain (CARDIA)    Difficulty of Paying Living Expenses: Not hard at all  Food Insecurity: No Food Insecurity (05/13/2024)   Hunger Vital Sign    Worried About Running Out of Food in the Last Year: Never true    Ran Out of Food in the Last Year: Never true  Transportation Needs: No Transportation Needs (05/13/2024)   PRAPARE - Administrator, Civil Service (Medical): No    Lack of Transportation (Non-Medical): No  Physical Activity: Inactive (05/13/2024)   Exercise Vital Sign    Days of Exercise per Week: 0 days    Minutes of Exercise per Session: 0 min  Stress: No Stress Concern Present (05/13/2024)   [employer] of Occupational Health - Occupational Stress Questionnaire    Feeling of Stress : Not at all  Social  Connections: Moderately Isolated (05/13/2024)   Social Connection and Isolation Panel    Frequency of Communication with Friends and Family: More than three times a week    Frequency of Social Gatherings with Friends and Family: Never    Attends Religious Services: Never    Database administrator or Organizations: No    Attends Engineer, structural: Never    Marital Status: Married    Current Medications:  Current Outpatient Medications:    acetaminophen  (TYLENOL ) 500 MG tablet, Take 1,000 mg by mouth every 6 (six) hours as needed for moderate pain., Disp: , Rfl:    allopurinol  (ZYLOPRIM ) 300 MG tablet, Take 1 tablet (300 mg total) by mouth daily., Disp: 90 tablet, Rfl: 1   amLODipine  (NORVASC ) 5 MG tablet, Take 1 tablet (5 mg total) by mouth daily., Disp: 90 tablet, Rfl: 1   apixaban  (ELIQUIS ) 5 MG TABS tablet, Take 1 tablet (5 mg total) by mouth 2 (two) times daily., Disp: 180 tablet, Rfl: 1   atorvastatin  (LIPITOR ) 80 MG tablet, Take 1 tablet (80 mg total) by mouth at bedtime., Disp: 90 tablet, Rfl: 1   carvedilol  (COREG ) 12.5 MG tablet, Take 1 tablet (12.5 mg total) by mouth 2 (two) times daily with a meal., Disp: 180 tablet, Rfl: 0   Cholecalciferol (VITAMIN D3 PO), Take 1 capsule by mouth daily., Disp: , Rfl:    Coenzyme Q10 (COQ10) 100 MG CAPS, Take 100 mg by mouth daily., Disp: , Rfl:    Continuous Glucose Receiver (DEXCOM G7 RECEIVER) DEVI, . . change sensor every 10 days on insulin  for 90 days, Disp: , Rfl:    Continuous Glucose Sensor (DEXCOM G7 SENSOR) MISC, . . change sensor every 10 days on insulin  for 90 days, Disp: , Rfl:    EMBECTA INSULIN  SYR ULTRAFINE 31G X 15/64 1 ML MISC, Inject 1 Syringe into the skin 2 (two) times daily., Disp: , Rfl:    famotidine (PEPCID) 20 MG tablet, Take 20 mg by mouth  daily. , Disp: , Rfl:    furosemide  (LASIX ) 40 MG tablet, Take 2 tablet in the morning., Disp: 180 tablet, Rfl: 1   GLUCOSAMINE-CHONDROITIN PO, Take 1 tablet by mouth 2  (two) times daily., Disp: , Rfl:    insulin  NPH-regular Human (70-30) 100 UNIT/ML injection, Inject 50-70 Units into the skin See admin instructions. 70 units In the morning and 50 units in the evening, Disp: , Rfl:    KRILL OIL PO, Take 1 capsule by mouth daily., Disp: , Rfl:    losartan  (COZAAR ) 50 MG tablet, Take 1 tablet (50 mg total) by mouth daily., Disp: 90 tablet, Rfl: 3   meclizine  (ANTIVERT ) 12.5 MG tablet, Take 1 tablet (12.5 mg total) by mouth 3 (three) times daily as needed for dizziness., Disp: 30 tablet, Rfl: 0   MELATONIN PO, Take 10 mg by mouth at bedtime., Disp: , Rfl:    Multiple Vitamins-Minerals (MULTIVITAMIN WITH MINERALS) tablet, Take 1 tablet by mouth daily., Disp: , Rfl:    potassium chloride  (KLOR-CON ) 10 MEQ tablet, Take 1 tablet (10 mEq total) by mouth 2 (two) times daily. (Patient taking differently: Take 10 mEq by mouth daily.), Disp: 180 tablet, Rfl: 3  Review of Systems: + Shortness of breath, urinary frequency, incontinence, joint pain, back pain, itching, dizziness, problems with walking Denies appetite changes, fevers, chills, fatigue, unexplained weight changes. Denies hearing loss, neck lumps or masses, mouth sores, ringing in ears or voice changes. Denies cough or wheezing.   Denies chest pain or palpitations. Denies leg swelling. Denies abdominal distention, pain, blood in stools, constipation, diarrhea, nausea, vomiting, or early satiety. Denies pain with intercourse, dysuria, hematuria Denies hot flashes, pelvic pain, vaginal bleeding or vaginal discharge.   Denies muscle pain/cramps. Denies rash, or wounds. Denies headaches, numbness or seizures. Denies swollen lymph nodes or glands, denies easy bruising or bleeding. Denies anxiety, depression, confusion, or decreased concentration.  Physical Exam: BP (!) 145/75 (BP Location: Right Arm, Patient Position: Sitting)   Pulse 80   Temp 98 F (36.7 C) (Oral)   Resp 20   Wt (!) 334 lb (151.5 kg)    SpO2 100%   BMI 57.33 kg/m  General: Alert, oriented, no acute distress. HEENT: Normocephalic, atraumatic, sclera anicteric. Chest: Somewhat limited by body habitus but clear to auscultation bilaterally.   Cardiovascular: regular rate, regular rhythm, no murmurs.  Abdomen: Obese, soft, nontender.  Normoactive bowel sounds.  No masses or hepatosplenomegaly appreciated. Extremities: Somewhat limited range of motion.  Warm, well perfused.  1-2+ LE edema. Lymphatics: No cervical, supraclavicular, or inguinal adenopathy. GU: Normal appearing external genitalia without erythema, excoriation, or lesions. Bimanual exam reveals no masses or nodularity, cervix flush with the vagina.  Uterine assessment is limited by body habitus.  Rectovaginal exam confirms findings, no parametrial nodularity.  Laboratory & Radiologic Studies: None new  Assessment & Plan: [name] is a 74 y.o. woman with clinical stage I grade 1 endometrioid endometrial cancer, non-operative candidate s/p D&C and IUD (progestin releasing) on 08/20/19. Complete clinical response to progestin therapy with negative repeat sampling in December, 2020. Developed recurrence based on biopsy in 02/2021. She was started on Megace  which was increased in 05/2022 when biopsy showed FIGO grade 2 endometrial cancer. Given persistent malignancy on biopsy, IUD removed 3/023 and patient treated with definitive RT completed in 03/2022.    Patient continues to do well without symptoms.  She is NED on exam although her exam is limited by prior radiation and body habitus.  Most recent MRI in February showed no significant change noted from to prior studies.  She is scheduled for her next MRI on 8/11.  Given worsening mixed urinary incontinence, offered referral to urogynecology.  Referral placed today.   We will continue with surveillance visits every 3 months until 2-3 years after completion of RT.  We discussed signs and symptoms that would be  concerning for cancer recurrence, and I stressed the importance of calling if she develops any of these.  22 minutes of total time was spent for this patient encounter, including preparation, face-to-face counseling with the patient and coordination of care, and documentation of the encounter.  Comer Dollar, MD  Division of Gynecologic Oncology  Department of Obstetrics and Gynecology  [facility]

## 2024-08-07 NOTE — Patient Instructions (Signed)
 It was good to see you today.  I do not see or feel any evidence of cancer recurrence on your exam.  I will see you for follow-up in 3 months.  As always, if you develop any new and concerning symptoms before your next visit, please call to see me sooner.

## 2024-08-10 ENCOUNTER — Ambulatory Visit (HOSPITAL_COMMUNITY)
Admission: RE | Admit: 2024-08-10 | Discharge: 2024-08-10 | Disposition: A | Discharge status: Home / Self Care | Source: Ambulatory Visit | Attending: Gynecologic Oncology | Admitting: Gynecologic Oncology

## 2024-08-10 DIAGNOSIS — N83292 Other ovarian cyst, left side: Secondary | ICD-10-CM | POA: Diagnosis not present

## 2024-08-10 DIAGNOSIS — K8020 Calculus of gallbladder without cholecystitis without obstruction: Secondary | ICD-10-CM | POA: Diagnosis not present

## 2024-08-10 DIAGNOSIS — C541 Malignant neoplasm of endometrium: Secondary | ICD-10-CM | POA: Diagnosis not present

## 2024-08-10 DIAGNOSIS — K5730 Diverticulosis of large intestine without perforation or abscess without bleeding: Secondary | ICD-10-CM | POA: Diagnosis not present

## 2024-08-10 DIAGNOSIS — N888 Other specified noninflammatory disorders of cervix uteri: Secondary | ICD-10-CM | POA: Diagnosis not present

## 2024-08-10 MED ORDER — GADOBUTROL 1 MMOL/ML IV SOLN
10.0000 mL | Freq: Once | INTRAVENOUS | Status: AC | PRN
  Administered 2024-08-10 (×2): 10 mL via INTRAVENOUS

## 2024-08-14 ENCOUNTER — Ambulatory Visit: Payer: Self-pay | Admitting: Gynecologic Oncology

## 2024-08-19 NOTE — Telephone Encounter (Signed)
-----   Message from Comer JONELLE Dollar sent at 08/19/2024  8:39 AM EDT ----- Could you please call her and pass along my message about her MRI? Thank you! ----- Message ----- From: Interface, Rad Results In Sent: 08/14/2024  11:51 AM EDT To: Comer JONELLE Dollar, MD

## 2024-08-19 NOTE — Telephone Encounter (Signed)
 2nd attempt to reach patient to relay message from provider. Left voicemail requesting call back.

## 2024-08-19 NOTE — Telephone Encounter (Signed)
 Attempted to reach patient to relay message from provider. Left voicemail requesting call back.

## 2024-08-19 NOTE — Telephone Encounter (Signed)
 3rd attempt to reach patient to relay message from provider. Left voicemail requesting call back.

## 2024-08-20 NOTE — Telephone Encounter (Signed)
 4 th attempt to reach patient to relay message from provider. Left voicemail requesting call back.

## 2024-08-20 NOTE — Telephone Encounter (Signed)
-----   Message from Comer JONELLE Dollar sent at 08/19/2024  8:39 AM EDT ----- Could you please call her and pass along my message about her MRI? Thank you! ----- Message ----- From: Interface, Rad Results In Sent: 08/14/2024  11:51 AM EDT To: Comer JONELLE Dollar, MD

## 2024-09-02 ENCOUNTER — Telehealth: Payer: Self-pay | Admitting: *Deleted

## 2024-09-02 NOTE — Telephone Encounter (Signed)
 Spoke with patient who returned call from the office on 8/21. Pt states she did received the message from Dr.Tucker in regards to her MRI results. Pt apologized for not calling back sooner. Pt has no further concerns or questions at this time.

## 2024-09-10 ENCOUNTER — Encounter: Payer: Self-pay | Admitting: Podiatry

## 2024-09-10 ENCOUNTER — Ambulatory Visit (INDEPENDENT_AMBULATORY_CARE_PROVIDER_SITE_OTHER): Admitting: Podiatry

## 2024-09-10 VITALS — Ht 64.0 in | Wt 334.0 lb

## 2024-09-10 DIAGNOSIS — M79675 Pain in left toe(s): Secondary | ICD-10-CM

## 2024-09-10 DIAGNOSIS — E118 Type 2 diabetes mellitus with unspecified complications: Secondary | ICD-10-CM | POA: Diagnosis not present

## 2024-09-10 DIAGNOSIS — M79674 Pain in right toe(s): Secondary | ICD-10-CM | POA: Diagnosis not present

## 2024-09-10 DIAGNOSIS — B351 Tinea unguium: Secondary | ICD-10-CM

## 2024-09-10 NOTE — Progress Notes (Signed)
 This patient returns to my office for at risk foot care.  This patient requires this care by a professional since this patient will be at risk due to having diabetes.  Her right lower leg is draining. This patient is unable to cut nails herself since the patient cannot reach her nails.These nails are painful walking and wearing shoes.  This patient presents for at risk foot care today.  General Appearance  Alert, conversant and in no acute stress.  Vascular  Dorsalis pedis and posterior tibial  pulses are palpable  bilaterally.  Capillary return is within normal limits  bilaterally. Temperature is within normal limits  bilaterally.  Neurologic  Senn-Weinstein monofilament wire test within normal limits  bilaterally. Muscle power within normal limits bilaterally.  Nails Thick disfigured discolored nails with subungual debris  from hallux to fifth toes bilaterally. No evidence of bacterial infection or drainage bilaterally.  Orthopedic  No limitations of motion  feet .  No crepitus or effusions noted.  No bony pathology or digital deformities noted.  Skin  normotropic skin with no porokeratosis noted bilaterally.  No signs of infections or ulcers noted.     Onychomycosis  Pain in right toes  Pain in left toes  Consent was obtained for treatment procedures.   Mechanical debridement of nails 1-5  bilaterally performed with a nail nipper.  Filed with dremel without incident.    Return office visit      12     weeks              Told patient to return for periodic foot care and evaluation due to potential at risk complications.   Helane Gunther DPM

## 2024-09-22 DIAGNOSIS — H35351 Cystoid macular degeneration, right eye: Secondary | ICD-10-CM | POA: Diagnosis not present

## 2024-09-27 ENCOUNTER — Other Ambulatory Visit: Payer: Self-pay | Admitting: Family Medicine

## 2024-09-28 ENCOUNTER — Inpatient Hospital Stay: Attending: Gynecologic Oncology | Admitting: Hematology and Oncology

## 2024-09-28 ENCOUNTER — Encounter: Payer: Self-pay | Admitting: Hematology and Oncology

## 2024-09-28 ENCOUNTER — Inpatient Hospital Stay

## 2024-09-28 VITALS — BP 155/87 | HR 71 | Temp 98.5°F | Resp 18 | Ht 64.0 in | Wt 334.0 lb

## 2024-09-28 DIAGNOSIS — D7589 Other specified diseases of blood and blood-forming organs: Secondary | ICD-10-CM | POA: Diagnosis not present

## 2024-09-28 DIAGNOSIS — Z6841 Body Mass Index (BMI) 40.0 and over, adult: Secondary | ICD-10-CM | POA: Diagnosis not present

## 2024-09-28 DIAGNOSIS — K709 Alcoholic liver disease, unspecified: Secondary | ICD-10-CM | POA: Insufficient documentation

## 2024-09-28 DIAGNOSIS — E66813 Obesity, class 3: Secondary | ICD-10-CM | POA: Insufficient documentation

## 2024-09-28 DIAGNOSIS — E119 Type 2 diabetes mellitus without complications: Secondary | ICD-10-CM | POA: Insufficient documentation

## 2024-09-28 DIAGNOSIS — Z8542 Personal history of malignant neoplasm of other parts of uterus: Secondary | ICD-10-CM | POA: Insufficient documentation

## 2024-09-28 NOTE — Assessment & Plan Note (Addendum)
 The patient have chronic macrocytosis without anemia for a long time I suspect this is due to alcohol intake and related to liver disease The patient has diabetes for over 35 years and on my own review, her liver have signs of fatty liver changes with widening features, mild scalloping and left lobe hypertrophy We discussed importance of staying abstinent from alcohol I recommend repeat labs in 6 weeks when she returns to see Dr. Viktoria She will also have borderline low B12 level earlier this year Plan to recheck it again in 6 weeks  The borderline lymphopenia is of no significance as the patient does not have elevated white blood cell count and does not need further workup

## 2024-09-28 NOTE — Progress Notes (Signed)
 [facility] CONSULT NOTE  Patient Care Team: Candise Aleene DEL, MD as PCP - General (Family Medicine) Viktoria Comer SAUNDERS, MD as Consulting Physician (Gynecologic Oncology) Okey Vina GAILS, MD as Consulting Physician (Cardiology) [facility], [facility] Faythe Purchase, MD as Consulting Physician (Endocrinology)  ASSESSMENT & PLAN:  Macrocytosis associated with alcohol The patient have chronic macrocytosis without anemia for a long time I suspect this is due to alcohol intake and related to liver disease The patient has diabetes for over 35 years and on my own review, [name] liver have signs of fatty liver changes with widening features, mild scalloping and left lobe hypertrophy We discussed importance of staying abstinent from alcohol I recommend repeat labs in 6 weeks when she returns to see Dr. Viktoria She will also have borderline low B12 level earlier this year Plan to recheck it again in 6 weeks  The borderline lymphopenia is of no significance as the patient does not have elevated white blood cell count and does not need further workup  Orders Placed This Encounter  Procedures   CBC with Differential (Cancer Center Only)    Standing Status:   Future    Expiration Date:   09/28/2025   Vitamin B12    Standing Status:   Future    Expiration Date:   09/28/2025    The total time spent in the appointment was 55 minutes encounter with patients including review of chart and various tests results, discussions about plan of care and coordination of care plan   All questions were answered. The patient knows to call the clinic with any problems, questions or concerns. No barriers to learning was detected.  Almarie Bedford, MD 9/29/20253:09 PM  CHIEF COMPLAINTS/PURPOSE OF CONSULTATION:  Mild lymphopenia with associated macrocytosis without anemia  HISTORY OF PRESENTING ILLNESS:  [name] 74 y.o. female is here because of abnormal CBC The patient has diabetes for  over 35 years She has significant class III obesity with a BMI over 57 She also have remote history of endometrial cancer being monitored at the GYN oncology clinic  I have the opportunity to review his CBC dated back to 2017 On June 07, 2016, white count 13.1, hemoglobin 13.2, platelet 182 On June 13, 2019, white count 16, hemoglobin 12.6 platelet count 211 On 07/14/2019, white count 5.7, hemoglobin 11.7, platelet 184 On August 17, 2019, white count 5.9, hemoglobin 11.6 and platelet count 203 On August 27, 2019, white count 7.3, hemoglobin 11.4 and platelet count 223 On 07/23/2022, white count 6.0, hemoglobin 9.8 and platelet count 332 On 02/04/2023, white count 4.4, hemoglobin 12.5 and platelet count 236 On July 31, 2023, white count 9.5, hemoglobin 13.3, MCV of 101 and platelet count 242 Between July 2024 and to present, she has high MCV greater than 100 January 27, 2024, white count 5.5, hemoglobin 12.7, MCV 100.8, vitamin B12 261 On March 16, 2024, white count 5.5, hemoglobin 13.3, MCV 99.7 and platelet count 204 On July 27, 2024, white count 5.1, hemoglobin 12.5, MCV 99.5.  Absolute lymphocyte count 0.6  [name] recent labs in July was drawn as part of [name] diabetes follow-up The patient has serial imaging studies done over the past few years due to diagnosis of endometrial cancer I inquired about alcohol intake; she drinks vodka on a regular basis, at least 2 days/week  MEDICAL HISTORY:  Past Medical History:  Diagnosis Date   Aspiration pneumonitis (HCC)    in hosp for SBO 08/2023   Asymptomatic cholelithiasis  Atrial fibrillation (HCC)    persistent (outpt monitoring 08/2022)   Bilateral lower extremity edema    Carpal tunnel syndrome    bilateral, had surgery on right hand   Chronic renal insufficiency, stage 3 (moderate)    GFR 50s   Diabetes mellitus without complication (HCC)    Dr. Faythe   Diverticulosis    +hx of 'itis   Endometrial cancer [facility])    RT spring 2024   GERD  (gastroesophageal reflux disease)    Gout    No problems after getting on allopurinol    Heart murmur    soft, systolic   History of blood transfusion    History of colon polyps    History of iron deficiency    Hyperlipidemia    Hypertension    Osteoarthritis, multiple sites    esp knees   SBO (small bowel obstruction) (HCC)    adhesions (admission 07/2023)   Urinary incontinence     SURGICAL HISTORY: Past Surgical History:  Procedure Laterality Date   CARPAL TUNNEL RELEASE Bilateral    R 1991.  L 2024   Cataract  04/01/2024   Right   COLON SURGERY     COLONOSCOPY     x2.  Last was 2018   COLOSTOMY     COLOSTOMY REVERSAL     DILATATION & CURETTAGE/HYSTEROSCOPY WITH MYOSURE N/A 07/17/2019   Procedure: DILATATION & CURETTAGE/HYSTEROSCOPY WITH MYOSURE;  Surgeon: Marget Lenis, MD;  Location: [facility];  Service: Gynecology;  Laterality: N/A;   OOPHORECTOMY  2000   unilateral   PILONIDAL CYST EXCISION     ROBOTIC ASSISTED TOTAL HYSTERECTOMY WITH BILATERAL SALPINGO OOPHERECTOMY N/A 08/20/2019   Procedure: DIAGNSOTIC LAPAROSCOPY, DILATION AND CURETTAGE, IUD PLACEMENT ;  Surgeon: Eloy Herring, MD;  Location: [facility];  Service: Gynecology;  Laterality: N/A;   SKIN CANCER EXCISION     TRANSTHORACIC ECHOCARDIOGRAM     08/2022 EF 50-55%, inc PA pressure, nl RV fxn, valves ok    SOCIAL HISTORY: Social History   Socioeconomic History   Marital status: Married    Spouse name: Not on file   Number of children: Not on file   Years of education: Not on file   Highest education level: Bachelor's degree (e.g., BA, AB, BS)  Occupational History   Not on file  Tobacco Use   Smoking status: Former    Current packs/day: 0.00    Average packs/day: 3.0 packs/day for 22.0 years (66.0 ttl pk-yrs)    Types: Cigarettes    Start date: 06/07/1965    Quit date: 06/08/1987    Years since quitting: 37.3   Smokeless tobacco: Never  Vaping Use   Vaping status: Never Used  Substance  and Sexual Activity   Alcohol use: Yes    Comment: twice per week   Drug use: No   Sexual activity: Yes    Partners: Male    Birth control/protection: Post-menopausal    Comment: married  Other Topics Concern   Not on file  Social History Narrative   Married, 2 children, 2 grandchildren.   Originally from [place] .   Education: Barista.   Occupation: Retired Airline pilot.   History of smoking.   Social Drivers of Corporate investment banker Strain: Low Risk  (09/27/2024)   Overall Financial Resource Strain (CARDIA)    Difficulty of Paying Living Expenses: Not hard at all  Food Insecurity: No Food Insecurity (09/28/2024)   Hunger Vital Sign    Worried About Running  Out of Food in the Last Year: Never true    Ran Out of Food in the Last Year: Never true  Transportation Needs: No Transportation Needs (09/28/2024)   PRAPARE - Administrator, Civil Service (Medical): No    Lack of Transportation (Non-Medical): No  Physical Activity: Inactive (09/27/2024)   Exercise Vital Sign    Days of Exercise per Week: 0 days    Minutes of Exercise per Session: Not on file  Stress: Stress Concern Present (09/27/2024)   [employer] of Occupational Health - Occupational Stress Questionnaire    Feeling of Stress: To some extent  Social Connections: Moderately Isolated (09/27/2024)   Social Connection and Isolation Panel    Frequency of Communication with Friends and Family: Three times a week    Frequency of Social Gatherings with Friends and Family: Once a week    Attends Religious Services: Never    Database administrator or Organizations: No    Attends Engineer, structural: Not on file    Marital Status: Married  Catering manager Violence: Not At Risk (09/28/2024)   Humiliation, Afraid, Rape, and Kick questionnaire    Fear of Current or Ex-Partner: No    Emotionally Abused: No    Physically Abused: No    Sexually Abused: No    FAMILY HISTORY: Family  History  Problem Relation Age of Onset   Hypertension Mother    Skin cancer Father    Diabetes Sister    Cancer Sister    Hypertension Sister    Cancer Sister    Diabetes Sister    Hearing loss Sister    Hypertension Sister    Kidney disease Sister    Cancer Sister    Diabetes Brother    Diabetes Maternal Grandfather    Stroke Maternal Grandfather    Diabetes Paternal Grandmother    Hearing loss Daughter     ALLERGIES:  is allergic to lisinopril, actos [pioglitazone], metformin, and metformin and related.  MEDICATIONS:  Current Outpatient Medications  Medication Sig Dispense Refill   acetaminophen  (TYLENOL ) 500 MG tablet Take 1,000 mg by mouth every 6 (six) hours as needed for moderate pain.     allopurinol  (ZYLOPRIM ) 300 MG tablet Take 1 tablet (300 mg total) by mouth daily. 90 tablet 1   amLODipine  (NORVASC ) 5 MG tablet Take 1 tablet (5 mg total) by mouth daily. 90 tablet 1   apixaban  (ELIQUIS ) 5 MG TABS tablet Take 1 tablet (5 mg total) by mouth 2 (two) times daily. 180 tablet 1   atorvastatin  (LIPITOR ) 80 MG tablet Take 1 tablet (80 mg total) by mouth at bedtime. 90 tablet 1   carvedilol  (COREG ) 12.5 MG tablet Take 1 tablet (12.5 mg total) by mouth 2 (two) times daily with a meal. 180 tablet 0   Cholecalciferol (VITAMIN D3 PO) Take 1 capsule by mouth daily.     Coenzyme Q10 (COQ10) 100 MG CAPS Take 100 mg by mouth daily.     Continuous Glucose Receiver (DEXCOM G7 RECEIVER) DEVI . . change sensor every 10 days on insulin  for 90 days     Continuous Glucose Sensor (DEXCOM G7 SENSOR) MISC . SABRA change sensor every 10 days on insulin  for 90 days     EMBECTA INSULIN  SYR ULTRAFINE 31G X 15/64 1 ML MISC Inject 1 Syringe into the skin 2 (two) times daily.     famotidine (PEPCID) 20 MG tablet Take 20 mg by mouth daily.  furosemide  (LASIX ) 40 MG tablet Take 2 tablet in the morning. 180 tablet 1   GLUCOSAMINE-CHONDROITIN PO Take 1 tablet by mouth 2 (two) times daily.     insulin   NPH-regular Human (70-30) 100 UNIT/ML injection Inject 50-70 Units into the skin See admin instructions. 70 units In the morning and 50 units in the evening     KRILL OIL PO Take 1 capsule by mouth daily.     losartan  (COZAAR ) 50 MG tablet Take 1 tablet (50 mg total) by mouth daily. 90 tablet 3   meclizine  (ANTIVERT ) 12.5 MG tablet Take 1 tablet (12.5 mg total) by mouth 3 (three) times daily as needed for dizziness. 30 tablet 0   MELATONIN PO Take 10 mg by mouth at bedtime.     Multiple Vitamins-Minerals (MULTIVITAMIN WITH MINERALS) tablet Take 1 tablet by mouth daily.     potassium chloride  (KLOR-CON ) 10 MEQ tablet Take 1 tablet (10 mEq total) by mouth 2 (two) times daily. (Patient taking differently: Take 10 mEq by mouth daily.) 180 tablet 3   No current facility-administered medications for this visit.    REVIEW OF SYSTEMS:   Constitutional: Denies fevers, chills or abnormal night sweats Eyes: Denies blurriness of vision, double vision or watery eyes Ears, nose, mouth, throat, and face: Denies mucositis or sore throat Respiratory: Denies cough, dyspnea or wheezes Cardiovascular: Denies palpitation, chest discomfort or lower extremity swelling Gastrointestinal:  Denies nausea, heartburn or change in bowel habits Skin: Denies abnormal skin rashes Lymphatics: Denies new lymphadenopathy or easy bruising Neurological:Denies numbness, tingling or new weaknesses Behavioral/Psych: Mood is stable, no new changes  All other systems were reviewed with the patient and are negative.  PHYSICAL EXAMINATION: ECOG PERFORMANCE STATUS: 1 - Symptomatic but completely ambulatory  Vitals:   09/28/24 1330  BP: (!) 155/87  Pulse: 71  Resp: 18  Temp: 98.5 F (36.9 C)  SpO2: 97%   Filed Weights   09/28/24 1330  Weight: (!) 334 lb (151.5 kg)    GENERAL:alert, no distress and comfortable.  Limited examination as the patient has significant central obesity  SKIN: skin color, texture, turgor are  normal, no rashes or significant lesions EYES: normal, conjunctiva are pink and non-injected, sclera clear OROPHARYNX:no exudate, no erythema and lips, buccal mucosa, and tongue normal  NECK: supple, thyroid  normal size, non-tender, without nodularity LYMPH:  no palpable lymphadenopathy in the cervical, axillary or inguinal LUNGS: clear to auscultation and percussion with normal breathing effort HEART: regular rate & rhythm and no murmurs and no lower extremity edema ABDOMEN:abdomen soft, non-tender and normal bowel sounds Musculoskeletal:no cyanosis of digits and no clubbing  PSYCH: alert & oriented x 3 with fluent speech NEURO: no focal motor/sensory deficits  LABORATORY DATA:  I have reviewed the data as listed Lab Results  Component Value Date   WBC 5.5 03/16/2024   HGB 13.3 03/16/2024   HCT 39.1 03/16/2024   MCV 99.7 03/16/2024   PLT 204.0 03/16/2024   Recent Labs    12/12/23 1028 03/16/24 1327 06/17/24 1339  NA 143 142 142  K 4.2 4.1 4.5 Hemolysis seen...  CL 106 105 106  CO2 26 28 31   GLUCOSE 83 185* 100*  BUN 24* 27* 28*  CREATININE 0.99 1.10 1.03  CALCIUM  9.0 9.9 9.6  PROT  --  6.4 6.5  ALBUMIN  --  4.3 4.1  AST  --  17 23  ALT  --  14 12  ALKPHOS  --  138* 123*  BILITOT  --  0.5 0.7    RADIOGRAPHIC STUDIES: I have personally reviewed [name] CT imaging from 2024

## 2024-09-29 ENCOUNTER — Other Ambulatory Visit: Payer: Self-pay

## 2024-09-29 ENCOUNTER — Encounter: Payer: Self-pay | Admitting: Gynecologic Oncology

## 2024-09-29 MED ORDER — FUROSEMIDE 40 MG PO TABS: ORAL_TABLET | ORAL | 1 refills | Status: DC

## 2024-09-29 MED ORDER — ALLOPURINOL 300 MG PO TABS
300.0000 mg | ORAL_TABLET | Freq: Every day | ORAL | 1 refills | Status: AC
Start: 2024-09-29 — End: ?

## 2024-10-01 ENCOUNTER — Encounter: Payer: Self-pay | Admitting: Family Medicine

## 2024-10-01 ENCOUNTER — Ambulatory Visit (INDEPENDENT_AMBULATORY_CARE_PROVIDER_SITE_OTHER): Admitting: Family Medicine

## 2024-10-01 VITALS — BP 117/65 | HR 76 | Temp 99.1°F | Ht 64.0 in | Wt 327.8 lb

## 2024-10-01 DIAGNOSIS — Z794 Long term (current) use of insulin: Secondary | ICD-10-CM | POA: Diagnosis not present

## 2024-10-01 DIAGNOSIS — E1121 Type 2 diabetes mellitus with diabetic nephropathy: Secondary | ICD-10-CM

## 2024-10-01 DIAGNOSIS — N2889 Other specified disorders of kidney and ureter: Secondary | ICD-10-CM

## 2024-10-01 DIAGNOSIS — R600 Localized edema: Secondary | ICD-10-CM | POA: Diagnosis not present

## 2024-10-01 DIAGNOSIS — L909 Atrophic disorder of skin, unspecified: Secondary | ICD-10-CM

## 2024-10-01 DIAGNOSIS — I10 Essential (primary) hypertension: Secondary | ICD-10-CM | POA: Diagnosis not present

## 2024-10-01 DIAGNOSIS — Z23 Encounter for immunization: Secondary | ICD-10-CM | POA: Diagnosis not present

## 2024-10-01 DIAGNOSIS — E7800 Pure hypercholesterolemia, unspecified: Secondary | ICD-10-CM | POA: Diagnosis not present

## 2024-10-01 DIAGNOSIS — I5032 Chronic diastolic (congestive) heart failure: Secondary | ICD-10-CM

## 2024-10-01 LAB — BASIC METABOLIC PANEL WITH GFR
BUN: 24 mg/dL — ABNORMAL HIGH (ref 6–23)
CO2: 33 meq/L — ABNORMAL HIGH (ref 19–32)
Calcium: 9.9 mg/dL (ref 8.4–10.5)
Chloride: 99 meq/L (ref 96–112)
Creatinine, Ser: 1.46 mg/dL — ABNORMAL HIGH (ref 0.40–1.20)
GFR: 34.84 mL/min — ABNORMAL LOW (ref >60.00)
Glucose, Bld: 114 mg/dL — ABNORMAL HIGH (ref 70–99)
Potassium: 4.7 meq/L (ref 3.5–5.1)
Sodium: 141 meq/L (ref 135–145)

## 2024-10-01 NOTE — Progress Notes (Signed)
 OFFICE VISIT  10/01/2024  CC:  Chief Complaint  Patient presents with   Medical Management of Chronic Issues    Pt is not fasting    Patient is a 74 y.o. female who presents for 4-month follow-up chronic bilateral lower extremity edema, hypertension, chronic diastolic heart failure, and chronic renal insufficiency. A/P as of last visit: 1) Chronic diastolic HF, asymptomatic but activity very limited by her osteoarthritis. Stable.  Continue losartan  50 mg a day, Coreg  12.5 mg twice a day, and Lasix  40 mg daily. Electrolytes and creatinine today.   2) CRI III. Avoid NSAIDs. Lytes/cr today.   3) HTN, stable on amlod 5mg  every day, coreg  12.5 bid, and losartan  50 every day. Lytes/cr today.   #4 hypercholesterolemia. Stable on a atorvastatin  80 mg a day. LDL was 61 about 3 months ago. Plan repeat lipids in 3 months.   #5 chronic bilateral lower extremity edema/lymphedema. Stable.  No significant/worrisome stasis ulcers. No sign of infection. She will continue sodium restriction (she has done very well with this lately). Continue Lasix  80 mg daily.   6.  DM per endo Dr. Faythe.  INTERIM HX: Jomaira is doing pretty well. Her biggest issue that impairs her quality of life is chronic incontinence. She has an appointment soon to establish with a urogynecologist.  She does not feel like her swelling is any different than baseline.  No ulcers on the legs.  ROS as above, plus--> no fevers, no CP, no SOB, no wheezing, no cough, no dizziness, no HAs, no rashes, no melena/hematochezia.  No polyuria or polydipsia.   No focal weakness, paresthesias, or tremors.  No acute vision or hearing abnormalities.  No n/v/d or abd pain.  No palpitations.    Past Medical History:  Diagnosis Date   Aspiration pneumonitis (HCC)    in hosp for SBO 08/2023   Asymptomatic cholelithiasis    Atrial fibrillation (HCC)    persistent (outpt monitoring 08/2022)   Bilateral lower extremity edema    Carpal  tunnel syndrome    bilateral, had surgery on right hand   Chronic renal insufficiency, stage 3 (moderate)    GFR 50s   Diabetes mellitus without complication (HCC)    Dr. Faythe   Diverticulosis    +hx of 'itis   Endometrial cancer [facility])    RT spring 2024   GERD (gastroesophageal reflux disease)    Gout    No problems after getting on allopurinol    Heart murmur    soft, systolic   History of blood transfusion    History of colon polyps    History of iron deficiency    Hyperlipidemia    Hypertension    Osteoarthritis, multiple sites    esp knees   SBO (small bowel obstruction) (HCC)    adhesions (admission 07/2023)   Urinary incontinence     Past Surgical History:  Procedure Laterality Date   CARPAL TUNNEL RELEASE Bilateral    R 1991.  L 2024   Cataract  04/01/2024   Right   COLON SURGERY     COLONOSCOPY     x2.  Last was 2018   COLOSTOMY     COLOSTOMY REVERSAL     DILATATION & CURETTAGE/HYSTEROSCOPY WITH MYOSURE N/A 07/17/2019   Procedure: DILATATION & CURETTAGE/HYSTEROSCOPY WITH MYOSURE;  Surgeon: Marget Lenis, MD;  Location:  SURGERY CENTER;  Service: Gynecology;  Laterality: N/A;   OOPHORECTOMY  2000   unilateral   PILONIDAL CYST EXCISION     ROBOTIC ASSISTED  TOTAL HYSTERECTOMY WITH BILATERAL SALPINGO OOPHERECTOMY N/A 08/20/2019   Procedure: DIAGNSOTIC LAPAROSCOPY, DILATION AND CURETTAGE, IUD PLACEMENT ;  Surgeon: Eloy Herring, MD;  Location: [facility];  Service: Gynecology;  Laterality: N/A;   SKIN CANCER EXCISION     TRANSTHORACIC ECHOCARDIOGRAM     08/2022 EF 50-55%, inc PA pressure, nl RV fxn, valves ok    Outpatient Medications Prior to Visit  Medication Sig Dispense Refill   acetaminophen  (TYLENOL ) 500 MG tablet Take 1,000 mg by mouth every 6 (six) hours as needed for moderate pain.     allopurinol  (ZYLOPRIM ) 300 MG tablet Take 1 tablet (300 mg total) by mouth daily. 90 tablet 1   amLODipine  (NORVASC ) 5 MG tablet Take 1 tablet (5 mg total) by mouth  daily. 90 tablet 1   apixaban  (ELIQUIS ) 5 MG TABS tablet Take 1 tablet (5 mg total) by mouth 2 (two) times daily. 180 tablet 1   atorvastatin  (LIPITOR ) 80 MG tablet Take 1 tablet (80 mg total) by mouth at bedtime. 90 tablet 1   carvedilol  (COREG ) 12.5 MG tablet Take 1 tablet (12.5 mg total) by mouth 2 (two) times daily with a meal. 180 tablet 0   Cholecalciferol (VITAMIN D3 PO) Take 1 capsule by mouth daily.     Coenzyme Q10 (COQ10) 100 MG CAPS Take 100 mg by mouth daily.     Continuous Glucose Receiver (DEXCOM G7 RECEIVER) DEVI . . change sensor every 10 days on insulin  for 90 days     Continuous Glucose Sensor (DEXCOM G7 SENSOR) MISC . SABRA change sensor every 10 days on insulin  for 90 days     EMBECTA INSULIN  SYR ULTRAFINE 31G X 15/64 1 ML MISC Inject 1 Syringe into the skin 2 (two) times daily.     famotidine (PEPCID) 20 MG tablet Take 20 mg by mouth daily.      furosemide  (LASIX ) 40 MG tablet Take 2 tablet in the morning. 180 tablet 1   GLUCOSAMINE-CHONDROITIN PO Take 1 tablet by mouth 2 (two) times daily.     insulin  NPH-regular Human (70-30) 100 UNIT/ML injection Inject 50-70 Units into the skin See admin instructions. 70 units In the morning and 50 units in the evening     KRILL OIL PO Take 1 capsule by mouth daily.     losartan  (COZAAR ) 50 MG tablet Take 1 tablet (50 mg total) by mouth daily. 90 tablet 3   meclizine  (ANTIVERT ) 12.5 MG tablet Take 1 tablet (12.5 mg total) by mouth 3 (three) times daily as needed for dizziness. 30 tablet 0   MELATONIN PO Take 10 mg by mouth at bedtime.     Multiple Vitamins-Minerals (MULTIVITAMIN WITH MINERALS) tablet Take 1 tablet by mouth daily.     potassium chloride  (KLOR-CON ) 10 MEQ tablet Take 1 tablet (10 mEq total) by mouth 2 (two) times daily. (Patient taking differently: Take 10 mEq by mouth daily.) 180 tablet 3   No facility-administered medications prior to visit.    Allergies  Allergen Reactions   Lisinopril Cough    Other reaction(s):  cough, Not available   Actos [Pioglitazone]     Unknown reaction   Metformin Other (See Comments)   Metformin And Related     Gas     Review of Systems As per HPI  PE:    10/01/2024    1:43 PM 09/28/2024    1:30 PM 09/10/2024   11:13 AM  Vitals with BMI  Height 5' 4 5' 4 5' 4  Weight  327 lbs 13 oz 334 lbs 334 lbs  BMI 56.24 57.3 57.3  Systolic 117 155   Diastolic 65 87   Pulse 76 71      Physical Exam  Gen: alert, well-appearing. Affect is pleasant, lucid thought and speech. Both lower legs with some lymphedema but no pitting edema.  She has diffuse fibrotic skin changes and hyperpigmentation of the feet and lower legs. Tenderness to palpation over the plantar aspect of the heels.  LABS:  Last CBC Lab Results  Component Value Date   WBC 5.5 03/16/2024   HGB 13.3 03/16/2024   HCT 39.1 03/16/2024   MCV 99.7 03/16/2024   MCH 32.3 08/04/2023   RDW 15.4 03/16/2024   PLT 204.0 03/16/2024   Last metabolic panel Lab Results  Component Value Date   GLUCOSE 100 (H) 06/17/2024   NA 142 06/17/2024   K 4.5 Hemolysis seen... 06/17/2024   CL 106 06/17/2024   CO2 31 06/17/2024   BUN 28 (H) 06/17/2024   CREATININE 1.03 06/17/2024   GFR 53.07 (L) 06/17/2024   CALCIUM  9.6 06/17/2024   PROT 6.5 06/17/2024   ALBUMIN 4.1 06/17/2024   BILITOT 0.7 06/17/2024   ALKPHOS 123 (H) 06/17/2024   AST 23 06/17/2024   ALT 12 06/17/2024   ANIONGAP 12 08/06/2023   Last lipids Lab Results  Component Value Date   CHOL 148 03/16/2024   HDL 37.90 (L) 03/16/2024   LDLCALC 61 03/16/2024   TRIG 244.0 (H) 03/16/2024   CHOLHDL 4 03/16/2024   Last hemoglobin A1c Lab Results  Component Value Date   HGBA1C 5.9 (H) 08/01/2023   Last thyroid  functions Lab Results  Component Value Date   TSH 6.020 (H) 09/10/2022   IMPRESSION AND PLAN:  1) Chronic diastolic HF, asymptomatic but activity very limited by her osteoarthritis. Stable.  Continue losartan  50 mg a day, Coreg  12.5 mg twice  a day, and Lasix  80 mg daily. Electrolytes and creatinine today.   2) CRI III. Avoid NSAIDs. Lytes/cr today.   3) HTN, stable on amlod 5mg  every day, coreg  12.5 bid, and losartan  50 every day. Lytes/cr today.   #4 hypercholesterolemia. Stable on a atorvastatin  80 mg a day. LDL was 61 about 6 months ago.  She is not fasting today. Plan repeat lipids in 3 months.   #5 chronic bilateral lower extremity edema/lymphedema. Stable.  No stasis ulcers. She will continue sodium restriction. Continue Lasix  80 mg daily.   6.  DM per endo Dr. Faythe. Good control.  She says her last hemoglobin A1c with Dr. Faythe was 6.1%. She is unable to give a urine sample for microalbumin/creatinine testing due to complete urinary incontinence.  #7 bilateral heel pain. States that she has had this for about the last year. She notes that after she rests overnight and first gets up in the morning her feet do not hurt.  As her day goes on they hurt worse and worse. Question plantar fat pad syndrome. Lower suspicion of plantar fasciitis.  Her pain is not consistent with peripheral neuropathy.  An After Visit Summary was printed and given to the patient.  FOLLOW UP: Return in about 3 months (around 01/01/2025) for routine chronic illness f/u.  Signed:  Gerlene Hockey, MD           10/01/2024

## 2024-10-04 ENCOUNTER — Ambulatory Visit: Payer: Self-pay | Admitting: Family Medicine

## 2024-10-04 DIAGNOSIS — N2889 Other specified disorders of kidney and ureter: Secondary | ICD-10-CM

## 2024-10-12 ENCOUNTER — Other Ambulatory Visit

## 2024-10-12 ENCOUNTER — Ambulatory Visit: Payer: Self-pay | Admitting: Family Medicine

## 2024-10-12 DIAGNOSIS — N2889 Other specified disorders of kidney and ureter: Secondary | ICD-10-CM

## 2024-10-12 LAB — BASIC METABOLIC PANEL WITH GFR
BUN: 28 mg/dL — ABNORMAL HIGH (ref 6–23)
CO2: 31 meq/L (ref 19–32)
Calcium: 9.5 mg/dL (ref 8.4–10.5)
Chloride: 101 meq/L (ref 96–112)
Creatinine, Ser: 1.29 mg/dL — ABNORMAL HIGH (ref 0.40–1.20)
GFR: 40.42 mL/min — ABNORMAL LOW (ref >60.00)
Glucose, Bld: 166 mg/dL — ABNORMAL HIGH (ref 70–99)
Potassium: 4.5 meq/L (ref 3.5–5.1)
Sodium: 140 meq/L (ref 135–145)

## 2024-10-20 ENCOUNTER — Other Ambulatory Visit (INDEPENDENT_AMBULATORY_CARE_PROVIDER_SITE_OTHER)

## 2024-10-20 DIAGNOSIS — N2889 Other specified disorders of kidney and ureter: Secondary | ICD-10-CM

## 2024-10-20 LAB — BASIC METABOLIC PANEL WITH GFR
BUN: 24 mg/dL — ABNORMAL HIGH (ref 6–23)
CO2: 29 meq/L (ref 19–32)
Calcium: 9.2 mg/dL (ref 8.4–10.5)
Chloride: 103 meq/L (ref 96–112)
Creatinine, Ser: 1.02 mg/dL (ref 0.40–1.20)
GFR: 53.56 mL/min — ABNORMAL LOW (ref >60.00)
Glucose, Bld: 167 mg/dL — ABNORMAL HIGH (ref 70–99)
Potassium: 3.9 meq/L (ref 3.5–5.1)
Sodium: 140 meq/L (ref 135–145)

## 2024-10-21 ENCOUNTER — Ambulatory Visit: Payer: Self-pay | Admitting: Family Medicine

## 2024-10-26 ENCOUNTER — Other Ambulatory Visit: Payer: Self-pay | Admitting: Medical Genetics

## 2024-10-26 DIAGNOSIS — Z006 Encounter for examination for normal comparison and control in clinical research program: Secondary | ICD-10-CM

## 2024-11-06 ENCOUNTER — Inpatient Hospital Stay: Attending: Gynecologic Oncology | Admitting: Gynecologic Oncology

## 2024-11-06 ENCOUNTER — Encounter: Payer: Self-pay | Admitting: Gynecologic Oncology

## 2024-11-06 ENCOUNTER — Inpatient Hospital Stay

## 2024-11-06 VITALS — BP 156/56 | HR 79 | Temp 98.4°F | Resp 19 | Wt 326.6 lb

## 2024-11-06 DIAGNOSIS — Z6841 Body Mass Index (BMI) 40.0 and over, adult: Secondary | ICD-10-CM | POA: Diagnosis not present

## 2024-11-06 DIAGNOSIS — Z9223 Personal history of estrogen therapy: Secondary | ICD-10-CM | POA: Diagnosis not present

## 2024-11-06 DIAGNOSIS — Z923 Personal history of irradiation: Secondary | ICD-10-CM | POA: Insufficient documentation

## 2024-11-06 DIAGNOSIS — N3946 Mixed incontinence: Secondary | ICD-10-CM | POA: Diagnosis not present

## 2024-11-06 DIAGNOSIS — Z8542 Personal history of malignant neoplasm of other parts of uterus: Secondary | ICD-10-CM | POA: Insufficient documentation

## 2024-11-06 DIAGNOSIS — D7589 Other specified diseases of blood and blood-forming organs: Secondary | ICD-10-CM

## 2024-11-06 DIAGNOSIS — C541 Malignant neoplasm of endometrium: Secondary | ICD-10-CM

## 2024-11-06 LAB — CBC WITH DIFFERENTIAL (CANCER CENTER ONLY)
Abs Immature Granulocytes: 0.01 K/uL (ref 0.00–0.07)
Basophils Absolute: 0 K/uL (ref 0.0–0.1)
Basophils Relative: 0 %
Eosinophils Absolute: 0.1 K/uL (ref 0.0–0.5)
Eosinophils Relative: 2 %
HCT: 38.3 % (ref 36.0–46.0)
Hemoglobin: 12.8 g/dL (ref 12.0–15.0)
Immature Granulocytes: 0 %
Lymphocytes Relative: 11 %
Lymphs Abs: 0.7 K/uL (ref 0.7–4.0)
MCH: 32.1 pg (ref 26.0–34.0)
MCHC: 33.4 g/dL (ref 30.0–36.0)
MCV: 96 fL (ref 80.0–100.0)
Monocytes Absolute: 0.5 K/uL (ref 0.1–1.0)
Monocytes Relative: 7 %
Neutro Abs: 5.2 K/uL (ref 1.7–7.7)
Neutrophils Relative %: 80 %
Platelet Count: 186 K/uL (ref 150–400)
RBC: 3.99 MIL/uL (ref 3.87–5.11)
RDW: 14.2 % (ref 11.5–15.5)
WBC Count: 6.5 K/uL (ref 4.0–10.5)
nRBC: 0 % (ref 0.0–0.2)

## 2024-11-06 LAB — VITAMIN B12: Vitamin B-12: 405 pg/mL (ref 180–914)

## 2024-11-06 NOTE — Progress Notes (Signed)
 Gynecologic Oncology Return Clinic Visit  11/06/24  Reason for Visit: surveillance   Treatment History: Patient reports a history of postmenopausal bleeding since July 2020.  Is been persistent low low level bleeding, spotting but no heavy passage of clots.  She reported this to her gynecologist who ordered a transvaginal ultrasound on June 24, 2019.  This revealed a uterus measuring 6.7 x 3.4 x 5.5 cm with an endometrial thickness of 1.4 cm.  The ovaries were not visualized.   She was taken to the operating room for a curettage and polypectomy on July 17, 2019 which revealed FIGO grade 1 endometrioid adenocarcinoma.   Patient has a complex medical history.  She is morbidly obese with a BMI of 48 kg/m.  She experienced ruptured diverticulitis in 1997 which required a diverting colostomy and Hartman's pouch.  She 6 weeks later she was taken to the operating room for laparotomy and reversal of Hartman's.  She reports that the surgeon described her abdomen is looking like a gallon of superglue had been poured inside her.  It was an extensively long surgery.  She is unsure if additional bowel resections were necessary.  She had a 1 month hospitalization following this reversal of colostomy.  She developed a bowel obstruction during that hospitalization and required reoperation to resolve this.   In 2000 she developed a growth on an ovary (she cannot remember which side).  For this she underwent laparotomy and oophorectomy.  This surgery was performed in [place] .  She was planning to have a hysterectomy at that procedure, however the surgeon aborted plans for hysterectomy and performed only oophorectomy after encountering significant adhesive disease.  She states that he told her surgery would have needed to the last 6 to 8 hours in order to perform hysterectomy due to the adhesive disease.   The patient has type 2 diabetes mellitus and has had this for greater than 30 years.  She reports having no  endorgan disease.  Her hemoglobin A1c was 5.4% in May 2020.   Her family history is significant for a father with skin cancer.  She has 3 sisters with cancer including one with endometrial cancer who is obese, a sister with stage II 3 breast cancer, and multiple myeloma.   She has had 2 prior vaginal deliveries.   She lives in [place].   On August 20, 2019 she underwent an attempt at a robotic assisted total hysterectomy BSO and sentinel lymph node biopsy.  The surgery was aborted after laparoscopic entry into the left upper quadrant encountered dense adhesions throughout the entirety of the abdomen which included dense colonic and small bowel adhesions to the anterior abdominal wall.  The density of these adhesions and their comprehensive nature across the entirety of her abdominal wall meant that it was not possible to place additional laparoscopic ports to perform adhesiolysis.  She would have required a laparotomy in order to achieve hysterectomy, and the patient had declined this preoperatively, and had elected to proceed with D&C with progestin releasing IUD placement if hysterectomy was not possible via minimally invasive surgical route.  Therefore we aborted the laparoscopic procedure and performed a D&C procedure with placement of a progestin releasing IUD.   Final pathology from the [facility] specimen showed adenocarcinoma favor endometrial primary.  It was difficult to grade the specimen due to his fragmented nature.   Repeat biopsy (office) sampling in December, 2020 showed complete clinical response with no residual carcinoma.    The patient then transferred care to  Dr. Bonnita in [place] .  Repeat endometrial biopsy on 03/23/2021 showed residual endometrioid adenocarcinoma.   Repeat biopsy on 06/22/2021 showed FIGO grade 2 endometrioid adenocarcinoma.  There is a comment that her Megace , which must of been started prior to this, was increased to 160 mg twice daily.   Endometrial biopsy on  09/22/2021 showed residual endometrioid adenocarcinoma, FIGO grade 1-2, in the background of endometrium with exogenous hormone effect.   Pelvic MRI on 12/05/2021 noted a 3.3 cm simple left ovarian cyst, colonic diverticulosis, anterior endometrial mass in the uterine body with an IUD adjacent, suspicious for endometrial cancer.   Repeat endometrial biopsy on 02/01/2022 showed residual endometrial carcinoma, FIGO grade 1.   Staging CT of the chest, abdomen, and pelvis on 02/15/2022 revealed no adenopathy, no aggressive appearing osseous lesions.  Diastases recti with superimposed shallow ventral wall hernia measuring 1 x 7 cm.  Cholelithiasis noted without acute cholecystitis.   03/27/22: IUD removed.   Patient proceeded with definitive radiation therapy from 03/06/2022 - 04/18/2022.  She was treated with EBRT (4500 cGy in 25 fractions) utilizing IMRT treatment technique.  This was followed by weekly T&O HDR treatment to a total dose of 3000 cGy in 5 fractions.  Total treatment dose was 7500 cGy.  Treatment was tolerated well other than fatigue and mild loose stools.   Posttreatment pelvic MRI on 06/12/2022 showed a slightly less bulky uterus.  Significant decrease in the endometrium compared to previous exam with endometrium now measuring 4 mm in thickness (previously was 2.7 cm).  Slight heterogenous signal in the endometrium.  No adenopathy.   She was last seen for follow-up on 06/20/2022 with radiation oncology.  She was doing well at that time without any vaginal bleeding or cramping.   MRI pelvis on 12/10/2022: Loss of normal differentiation between endometrium and zonal anatomy in general at the fundus and mid uterus.  Diffusion properties in this area are nondescript with no focal area of marked restricted diffusion.  Imaging findings are nonspecific although could reflect baseline treatment changes.  Small volume ascites may be reactive.  Report was addended after prior imaging was obtained.  Loss of  defined endometrium could be seen following radiotherapy.  Imaging findings remain more suggestive of posttreatment related changes from radiation but short interval follow-up suggested.   MRI pelvis on 03/09/2023 reveals unchanged appearance of the uterus, with diffuse loss of endometrial definition and mild T2 hyperintensity of the myometrium.  Normal appearance of the cervix and endocervical canal.  Unchanged left ovarian cyst measuring up to 3.9 cm, similar in size dating back to 2017 with no further follow-up recommended.  Unchanged small volume ascites.  Thought to most likely reflect chronic, posttreatment/postradiation appearance of the endometrium and uterus.  No interval change or other suspicious findings to suggest recurrence or residual disease.   Admitted in 07/2023 with SBO, resolved with conservative management.   MRI pelvis 08/2023 with stable exam, no evidence of local recurrence or metastatic disease.  Continued visualization of loss of endometrial/myometrial interface, likely secondary to treatment.  Stable 4.2 cm cystic structure in the left adnexa.   MRI pelvis 02/2024 with unchanged posttreatment appearance of the uterus, no evidence of internal endometrial structure mass.  No adenopathy or metastatic disease in the pelvis.  Unchanged simple appearing fluid signal cyst in the left ovary.  Diffuse thickening of urinary bladder wall, similar to prior exam.  Small volume free fluid in the pelvis.  Interval History: Doing well.  Denies any vaginal bleeding denies  any pelvic pain or cramping.  Continues to have urinary incontinence.  Endorses baseline bowel function.  Past Medical/Surgical History: Past Medical History:  Diagnosis Date   Aspiration pneumonitis (HCC)    in hosp for SBO 08/2023   Asymptomatic cholelithiasis    Atrial fibrillation (HCC)    persistent (outpt monitoring 08/2022)   Bilateral lower extremity edema    Carpal tunnel syndrome    bilateral, had surgery on  right hand   Chronic renal insufficiency, stage 3 (moderate)    GFR 50s   Diabetes mellitus without complication (HCC)    Dr. Faythe   Diverticulosis    +hx of 'itis   Endometrial cancer [facility])    RT spring 2024   GERD (gastroesophageal reflux disease)    Gout    No problems after getting on allopurinol    Heart murmur    soft, systolic   History of blood transfusion    History of colon polyps    History of iron deficiency    Hyperlipidemia    Hypertension    Osteoarthritis, multiple sites    esp knees   SBO (small bowel obstruction) (HCC)    adhesions (admission 07/2023)   Urinary incontinence     Past Surgical History:  Procedure Laterality Date   CARPAL TUNNEL RELEASE Bilateral    R 1991.  L 2024   Cataract  04/01/2024   Right   COLON SURGERY     COLONOSCOPY     x2.  Last was 2018   COLOSTOMY     COLOSTOMY REVERSAL     DILATATION & CURETTAGE/HYSTEROSCOPY WITH MYOSURE N/A 07/17/2019   Procedure: DILATATION & CURETTAGE/HYSTEROSCOPY WITH MYOSURE;  Surgeon: Marget Lenis, MD;  Location: [facility];  Service: Gynecology;  Laterality: N/A;   OOPHORECTOMY  2000   unilateral   PILONIDAL CYST EXCISION     ROBOTIC ASSISTED TOTAL HYSTERECTOMY WITH BILATERAL SALPINGO OOPHERECTOMY N/A 08/20/2019   Procedure: DIAGNSOTIC LAPAROSCOPY, DILATION AND CURETTAGE, IUD PLACEMENT ;  Surgeon: Eloy Herring, MD;  Location: [facility];  Service: Gynecology;  Laterality: N/A;   SKIN CANCER EXCISION     TRANSTHORACIC ECHOCARDIOGRAM     08/2022 EF 50-55%, inc PA pressure, nl RV fxn, valves ok    Family History  Problem Relation Age of Onset   Hypertension Mother    Skin cancer Father    Diabetes Sister    Cancer Sister    Hypertension Sister    Cancer Sister    Diabetes Sister    Hearing loss Sister    Hypertension Sister    Kidney disease Sister    Cancer Sister    Diabetes Brother    Diabetes Maternal Grandfather    Stroke Maternal Grandfather    Diabetes Paternal Grandmother     Hearing loss Daughter     Social History   Socioeconomic History   Marital status: Married    Spouse name: Not on file   Number of children: Not on file   Years of education: Not on file   Highest education level: Bachelor's degree (e.g., BA, AB, BS)  Occupational History   Not on file  Tobacco Use   Smoking status: Former    Current packs/day: 0.00    Average packs/day: 3.0 packs/day for 22.0 years (66.0 ttl pk-yrs)    Types: Cigarettes    Start date: 06/07/1965    Quit date: 06/08/1987    Years since quitting: 37.4   Smokeless tobacco: Never  Vaping Use   Vaping  status: Never Used  Substance and Sexual Activity   Alcohol use: Yes    Comment: twice per week   Drug use: No   Sexual activity: Yes    Partners: Male    Birth control/protection: Post-menopausal    Comment: married  Other Topics Concern   Not on file  Social History Narrative   Married, 2 children, 2 grandchildren.   Originally from [place] .   Education: Barista.   Occupation: Retired airline pilot.   History of smoking.   Social Drivers of Corporate Investment Banker Strain: Low Risk  (09/27/2024)   Overall Financial Resource Strain (CARDIA)    Difficulty of Paying Living Expenses: Not hard at all  Food Insecurity: No Food Insecurity (09/28/2024)   Hunger Vital Sign    Worried About Running Out of Food in the Last Year: Never true    Ran Out of Food in the Last Year: Never true  Transportation Needs: No Transportation Needs (09/28/2024)   PRAPARE - Administrator, Civil Service (Medical): No    Lack of Transportation (Non-Medical): No  Physical Activity: Inactive (09/27/2024)   Exercise Vital Sign    Days of Exercise per Week: 0 days    Minutes of Exercise per Session: Not on file  Stress: Stress Concern Present (09/27/2024)   [employer] of Occupational Health - Occupational Stress Questionnaire    Feeling of Stress: To some extent  Social Connections: Moderately  Isolated (09/27/2024)   Social Connection and Isolation Panel    Frequency of Communication with Friends and Family: Three times a week    Frequency of Social Gatherings with Friends and Family: Once a week    Attends Religious Services: Never    Database Administrator or Organizations: No    Attends Engineer, Structural: Not on file    Marital Status: Married    Current Medications:  Current Outpatient Medications:    acetaminophen  (TYLENOL ) 500 MG tablet, Take 1,000 mg by mouth every 6 (six) hours as needed for moderate pain., Disp: , Rfl:    allopurinol  (ZYLOPRIM ) 300 MG tablet, Take 1 tablet (300 mg total) by mouth daily., Disp: 90 tablet, Rfl: 1   amLODipine  (NORVASC ) 5 MG tablet, Take 1 tablet (5 mg total) by mouth daily., Disp: 90 tablet, Rfl: 1   apixaban  (ELIQUIS ) 5 MG TABS tablet, Take 1 tablet (5 mg total) by mouth 2 (two) times daily., Disp: 180 tablet, Rfl: 1   atorvastatin  (LIPITOR ) 80 MG tablet, Take 1 tablet (80 mg total) by mouth at bedtime., Disp: 90 tablet, Rfl: 1   carvedilol  (COREG ) 12.5 MG tablet, Take 1 tablet (12.5 mg total) by mouth 2 (two) times daily with a meal., Disp: 180 tablet, Rfl: 0   Cholecalciferol (VITAMIN D3 PO), Take 1 capsule by mouth daily., Disp: , Rfl:    Coenzyme Q10 (COQ10) 100 MG CAPS, Take 100 mg by mouth daily., Disp: , Rfl:    Continuous Glucose Receiver (DEXCOM G7 RECEIVER) DEVI, . . change sensor every 10 days on insulin  for 90 days, Disp: , Rfl:    Continuous Glucose Sensor (DEXCOM G7 SENSOR) MISC, . . change sensor every 10 days on insulin  for 90 days, Disp: , Rfl:    EMBECTA INSULIN  SYR ULTRAFINE 31G X 15/64 1 ML MISC, Inject 1 Syringe into the skin 2 (two) times daily., Disp: , Rfl:    famotidine (PEPCID) 20 MG tablet, Take 20 mg by mouth daily. , Disp: ,  Rfl:    furosemide  (LASIX ) 40 MG tablet, Take 2 tablet in the morning. (Patient taking differently: Take 80 mg by mouth daily. Once daily every other day. Then patient takes 40  mg on the opposite day.), Disp: 180 tablet, Rfl: 1   GLUCOSAMINE-CHONDROITIN PO, Take 1 tablet by mouth 2 (two) times daily., Disp: , Rfl:    insulin  NPH-regular Human (70-30) 100 UNIT/ML injection, Inject 50-70 Units into the skin See admin instructions. 70 units In the morning and 50 units in the evening, Disp: , Rfl:    KRILL OIL PO, Take 1 capsule by mouth daily., Disp: , Rfl:    meclizine  (ANTIVERT ) 12.5 MG tablet, Take 1 tablet (12.5 mg total) by mouth 3 (three) times daily as needed for dizziness., Disp: 30 tablet, Rfl: 0   MELATONIN PO, Take 10 mg by mouth at bedtime., Disp: , Rfl:    Multiple Vitamins-Minerals (MULTIVITAMIN WITH MINERALS) tablet, Take 1 tablet by mouth daily., Disp: , Rfl:    potassium chloride  (KLOR-CON ) 10 MEQ tablet, Take 1 tablet (10 mEq total) by mouth 2 (two) times daily., Disp: 180 tablet, Rfl: 3   losartan  (COZAAR ) 50 MG tablet, Take 1 tablet (50 mg total) by mouth daily. (Patient not taking: Reported on 11/03/2024), Disp: 90 tablet, Rfl: 3  Review of Systems: + incontinence Denies appetite changes, fevers, chills, fatigue, unexplained weight changes. Denies hearing loss, neck lumps or masses, mouth sores, ringing in ears or voice changes. Denies cough or wheezing.  Denies shortness of breath. Denies chest pain or palpitations. Denies leg swelling. Denies abdominal distention, pain, blood in stools, constipation, diarrhea, nausea, vomiting, or early satiety. Denies pain with intercourse, dysuria, frequency, hematuria. Denies hot flashes, pelvic pain, vaginal bleeding or vaginal discharge.   Denies joint pain, back pain or muscle pain/cramps. Denies itching, rash, or wounds. Denies dizziness, headaches, numbness or seizures. Denies swollen lymph nodes or glands, denies easy bruising or bleeding. Denies anxiety, depression, confusion, or decreased concentration.  Physical Exam: BP (!) 156/56 (BP Location: Right Arm, Patient Position: Sitting)   Pulse 79    Temp 98.4 F (36.9 C) (Oral)   Resp 19   Wt (!) 326 lb 9.6 oz (148.1 kg)   SpO2 95%   BMI 56.06 kg/m  General: Alert, oriented, no acute distress. HEENT: Normocephalic, atraumatic, sclera anicteric. Chest: Somewhat limited by body habitus but clear to auscultation bilaterally.   Cardiovascular: regular rate, regular rhythm, no murmurs.  Abdomen: Obese, soft, nontender.  Normoactive bowel sounds.  No masses or hepatosplenomegaly appreciated. Extremities: Somewhat limited range of motion.  Warm, well perfused.  1-2+ LE edema. Lymphatics: No cervical, supraclavicular, or inguinal adenopathy. GU: Normal appearing external genitalia without erythema, excoriation, or lesions. Speculum exam reveals moderately atrophic vaginal mucosa.  Cervix is somewhat difficult to visualize and rather flush with the vaginal apex, atrophic. Bimanual exam reveals no masses or nodularity, cervix flush with the vagina.  Uterine assessment is limited by body habitus.  Rectovaginal exam confirms findings, no parametrial nodularity.  Laboratory & Radiologic Studies: MRI pelvis 08/10/24: 1. Similar posttreatment appearance of the uterine fundus without evidence of local recurrence. 2. Stable 4.2 cm simple appearing left ovarian cyst. Recommend continued attention on follow-up imaging. 3. Diffuse bladder wall thickening is similar prior and favored posttreatment sequela. 4. Small volume pelvic free fluid. 5. Cholelithiasis.  Assessment & Plan: [name] is a 74 y.o. woman with clinical stage I grade 1 endometrioid endometrial cancer, non-operative candidate s/p D&C and IUD (progestin releasing)  on 08/20/19. Complete clinical response to progestin therapy with negative repeat sampling in December, 2020. Developed recurrence based on biopsy in 02/2021. She was started on Megace  which was increased in 05/2022 when biopsy showed FIGO grade 2 endometrial cancer. Given persistent malignancy on biopsy, IUD removed 3/023 and  patient treated with definitive RT completed in 03/2022.    Patient continues to do well without symptoms.  She is NED on exam although her exam is limited by prior radiation and body habitus.    Most recent MRI in August showed no significant change noted from to prior studies.  Plan for repeat imaging in February   Given worsening mixed urinary incontinence, offered referral to urogynecology.  Referral placed at her last visit.  She is scheduled with Dr. Guadlupe in early December.   We will continue with surveillance visits every 3 months until 2-3 years after completion of RT. if she is NED on exam in February and on MRI, we will transition to visits every 6 months.  We discussed signs and symptoms that would be concerning for cancer recurrence, and I stressed the importance of calling if she develops any of these.  20 minutes of total time was spent for this patient encounter, including preparation, face-to-face counseling with the patient and coordination of care, and documentation of the encounter.  Comer Dollar, MD  Division of Gynecologic Oncology  Department of Obstetrics and Gynecology  [facility]

## 2024-11-06 NOTE — Patient Instructions (Signed)
 It was good to see you today.  I do not see or feel any evidence of cancer recurrence on your exam.  I will see you for follow-up in 3 months. We will get your next MRI in February.  As always, if you develop any new and concerning symptoms before your next visit, please call to see me sooner.

## 2024-11-09 ENCOUNTER — Ambulatory Visit: Payer: Self-pay | Admitting: Hematology and Oncology

## 2024-11-09 NOTE — Telephone Encounter (Signed)
 Called and given below message. She verbalized understanding.

## 2024-11-10 ENCOUNTER — Other Ambulatory Visit: Payer: Self-pay | Admitting: Family Medicine

## 2024-11-30 ENCOUNTER — Encounter: Payer: Self-pay | Admitting: Obstetrics

## 2024-11-30 ENCOUNTER — Other Ambulatory Visit (HOSPITAL_BASED_OUTPATIENT_CLINIC_OR_DEPARTMENT_OTHER): Payer: Self-pay

## 2024-11-30 ENCOUNTER — Ambulatory Visit: Admitting: Obstetrics

## 2024-11-30 VITALS — BP 148/79 | HR 91 | Ht 64.0 in | Wt 328.0 lb

## 2024-11-30 DIAGNOSIS — Z6841 Body Mass Index (BMI) 40.0 and over, adult: Secondary | ICD-10-CM

## 2024-11-30 DIAGNOSIS — N3946 Mixed incontinence: Secondary | ICD-10-CM | POA: Diagnosis not present

## 2024-11-30 DIAGNOSIS — R351 Nocturia: Secondary | ICD-10-CM | POA: Diagnosis not present

## 2024-11-30 LAB — POCT URINALYSIS DIP (CLINITEK)
Bilirubin, UA: NEGATIVE
Blood, UA: NEGATIVE
Glucose, UA: NEGATIVE mg/dL
Ketones, POC UA: NEGATIVE mg/dL
Leukocytes, UA: NEGATIVE
Nitrite, UA: NEGATIVE
POC PROTEIN,UA: 30 — AB
Spec Grav, UA: 1.02 (ref 1.010–1.025)
Urobilinogen, UA: 0.2 U/dL
pH, UA: 6.5 (ref 5.0–8.0)

## 2024-11-30 MED ORDER — GEMTESA 75 MG PO TABS: 75.0000 mg | ORAL_TABLET | Freq: Every day | ORAL | Status: AC

## 2024-11-30 MED ORDER — GEMTESA 75 MG PO TABS
75.0000 mg | ORAL_TABLET | Freq: Every day | ORAL | 2 refills | Status: AC
  Filled 2024-11-30: qty 30, 30d supply, fill #0
  Filled 2025-01-30: qty 30, 30d supply, fill #1

## 2024-11-30 NOTE — Assessment & Plan Note (Addendum)
 - POCT UA negative, PVR 40mL - tried oxybutynin  XL 10mg , gemtesa  in the daytime in 2024 without relief  - urgency > stress - We discussed the symptoms of overactive bladder (OAB), which include urinary urgency, urinary frequency, nocturia, with or without urge incontinence.  While we do not know the exact etiology of OAB, several treatment options exist. We discussed management including behavioral therapy (decreasing bladder irritants, urge suppression strategies, timed voids, bladder retraining), physical therapy, medication; for refractory cases posterior tibial nerve stimulation, sacral neuromodulation, and intravesical botulinum toxin injection.  For anticholinergic medications, we discussed the potential side effects of anticholinergics including dry eyes, dry mouth, constipation, cognitive impairment and urinary retention. For Beta-3 agonist medication, we discussed the potential side effect of elevated blood pressure which is more likely to occur in individuals with uncontrolled hypertension. - samples and Rx to resume Gemtesa  - can add Trospium for dual therapy - encouraged fluid management and caffeine reduction - switch lasix  to 2pm For refractory OAB we reviewed the procedure for intravesical Botox injection with cystoscopy in the office and reviewed the risks, benefits and alternatives of treatment including but not limited to infection, need for self-catheterization and need for repeat therapy.  We discussed that there is a 5-15% chance of needing to catheterize with Botox and that this usually resolves in a few months; however can persist for longer periods of time.  Typically Botox injections would need to be repeated every 3-12 months since this is not a permanent therapy.   We discussed the role of sacral neuromodulation and how it works. It requires a test phase, and documentation of bladder function via diary. After a successful test period, a permanent wire and generator are placed  in the OR. The battery lasts 5 years on average and would need to be replaced surgically.  The goal of this therapy is at least a 50% improvement in symptoms. It is NOT realistic to expect a 100% cure.  We reviewed the fact that about 30% of patients fail the test phase and are not candidates for permanent generator placement.  We discussed the risk of infection and that the patient would have to switch to MRI compatible mode during imaging once the device is placed. There are two companies that provide this therapy: [employer] and [employer] that both offer rechargeable or non-rechargeable options. For all procedures, we discussed risks of bleeding, infection, damage to surrounding organs including bowel, bladder, blood vessels, ureters and nerves, need for further surgery, risk of postoperative urinary incontinence or retention with need to catheterize, recurrent prolapse, numbness and weakness at any body site, buttock pain, and the rarer risks of blood clot, heart attack, pneumonia, death.    We also discussed the role of percutaneous tibial nerve stimulation and how it works.  She understands it requires 12 weekly visits for temporary neuromodulation of the sacral nerve roots via the tibial nerve and that she may then require continued tapered treatment.  - declines botox injections, pt has concerns with [facility] teaching due to habitus - pt considering PTNS, maybe challenging with LE edema - For treatment of stress urinary incontinence,  non-surgical options include expectant management, weight loss, physical therapy, as well as a pessary.  Surgical options include a midurethral sling, Burch urethropexy, and transurethral injection of a bulking agent. - consider pelvic floor PT - encouraged weight reduction, offered referral to healthy weight and wellness. Patient desires to resume dietary modifications due to success in the past. - discussed proper vulvar care, warm  compression, avoid pad use if possible at  night, cotton only underwear and barrier ointment if needed  - encouraged Vit E suppository, moisturizer with Replens/Revaree and avoid body lotion for vulvar use

## 2024-11-30 NOTE — Assessment & Plan Note (Addendum)
-   encouraged weight reduction due to association with pelvic floor disorders - offered referral to healthy weight and wellness, reports prior assessment with success after dietary modification and avoiding sweets. Reports talking to her husband and desires to start diet modification first.

## 2024-11-30 NOTE — Assessment & Plan Note (Addendum)
-   avoid fluid intake 3 hours before bedtime - elevated feet during the day or use compression socks to reduce lower extremity swelling - switch your diuretic (e.g. furosemide ) dosing to 2pm - due to snoring, referred for sleep study to rule out sleep apnea - samples to resume gemtesa  at bedtime with Rx provided - consider dual therapy  - encouraged to resume Purewick  - shares motorized wheelchair with her husband and uses walker at night, consider bedside commode due to possible functional incontinence and avoid falls - encouraged pad free nights if possible

## 2024-11-30 NOTE — Patient Instructions (Addendum)
 For night time frequency: - avoid fluid intake 3 hours before bedtime - elevated your feet during the day or use compression socks to reduce lower extremity swelling - switch your diuretic (e.g. furosemide ) dosing to 2pm - consider sleep study to rule out sleep apnea - consider bedside commode  - consider purewick system for urinary leakage.   - discussed proper vulvar care, warm compression, avoid pad use, cotton only underwear and barrier ointment if needed  - encouraged Vit E suppository, moisturizer with Replens/Revaree  We discussed the symptoms of overactive bladder (OAB), which include urinary urgency, urinary frequency, night-time urination, with or without urge incontinence.  We discussed management including behavioral therapy (decreasing bladder irritants by following a bladder diet, urge suppression strategies, timed voids, bladder retraining), physical therapy, medication; and for refractory cases posterior tibial nerve stimulation, sacral neuromodulation, and intravesical botulinum toxin injection.   For Beta-3 agonist medication, we discussed the potential side effect of elevated blood pressure which is more likely to occur in individuals with uncontrolled hypertension. You were given samples for Gemtesa  75 mg.  It can take a month to start working so give it time, but if you have bothersome side effects call sooner and we can try a different medication.  Call us  if you have trouble filling the prescription or if it's not covered by your insurance.  For treatment of stress urinary incontinence, which is leakage with physical activity/movement/strainging/coughing, we discussed expectant management versus nonsurgical options versus surgery. Nonsurgical options include weight loss, physical therapy, as well as a pessary.  Surgical options include a midurethral sling, which is a synthetic mesh sling that acts like a hammock under the urethra to prevent leakage of urine, a Burch  urethropexy, and transurethral injection of a bulking agent.

## 2024-11-30 NOTE — Progress Notes (Signed)
 New Patient Evaluation and Consultation  Referring Provider: Viktoria Comer SAUNDERS, MD PCP: Candise Aleene DEL, MD Date of Service: 11/30/2024  SUBJECTIVE Chief Complaint: New Patient (Initial Visit) [name] is a 74 y.o. female here today for urinary incontinence.)  History of Present Illness: Sacred Roa is a 74 y.o. White or Caucasian female seen in consultation at the request of Dr Viktoria for evaluation of mixed urinary incontinence.    Urinary leakage started with coughing, sneezing around childbirth around 48 years, urgency leakage started 10 years ago Tried OAB medications in the past with oxybutynin  XL 10mg  and gemtesa  in the daytime in 2024 by Dr. McGowen (PCP) without relief Endometrial cancer managed by Dr. Viktoria, aborted RATLH, BSO, sentinel lymph node biopsy 08/20/19 due to dense pelvic adhesions. Proceeded with D&C and IUD placement due to refusal for open procedure. Negative repeat EMB 12/2019, followed by FIGO grade II endometrioid adenocarcinoma on repeat EMB treated with Megace  and radiation in 2023. Continues surveillance every 3 months until 2-3 years after radiation, last seen 11/06/24.   MRI w/ and w/o contrast 08/10/24 with mild bladder wall thickening.  Started Lasix  40mg  around 2005, takes it in the morning. Now alternating between 80mg  and 40mg  every other day Ambulates with walker and uses motorized wheelchairs, takes 20 steps to the restroom. Reports sensitivity due to pads and sitting.   Review of records significant for: A. Fib, history of CKD, history of blood transfusion, SBO in 2024 treated conservatively, pelvic adhesive disease with ruptured diverticulitis in 1997 requiring a diverting colostomy and Hartman's pouch, BMI 48, T2DM, ventral hernia  MRI pelvic w/ and w/o contrast 08/10/24 CLINICAL DATA:  History of genital cancer, monitor.   EXAM: MRI PELVIS WITHOUT AND WITH CONTRAST   TECHNIQUE: Multiplanar multisequence MR imaging of the pelvis was  performed both before and after administration of intravenous contrast.   CONTRAST:  10mL GADAVIST  GADOBUTROL  1 MMOL/ML IV SOLN   COMPARISON:  Multiple priors including MRI February 10, 2024.   FINDINGS: Urinary Tract: Similar mild wall thickening of a nondistended urinary bladder.   Bowel: Colonic diverticulosis.   Vascular/Lymphatic: No pathologically enlarged lymph nodes. No significant vascular abnormality.   Reproductive:   Uterus: Similar posttreatment appearance of the uterine fundus with homogeneous T2 intermediate signal and contrast enhancement. No evidence of internal endometrial stricture or mass.   Unchanged tiny benign nabothian cysts of the cervix. Unchanged simple appearing fluid signal cyst in the left ovary, situated in the left lower quadrant, measuring 4.1 x 2.9 cm (series 6, image 4). Right ovary not clearly visualized.   Right ovary: Not confidently identified.   Left ovary: Stable 4.2 cm thin walled left ovarian cyst without suspicious postcontrast enhancement, soft tissue nodularity or wall thickening.   Other: Small volume pelvic free fluid.  Cholelithiasis.   Musculoskeletal:  No suspicious osseous lesion.   IMPRESSION: 1. Similar posttreatment appearance of the uterine fundus without evidence of local recurrence. 2. Stable 4.2 cm simple appearing left ovarian cyst. Recommend continued attention on follow-up imaging. 3. Diffuse bladder wall thickening is similar prior and favored posttreatment sequela. 4. Small volume pelvic free fluid. 5. Cholelithiasis.     Electronically Signed   By: Reyes Holder M.D.   On: 08/14/2024 11:49  Urinary Symptoms: Leaks urine with cough/ sneeze, laughing, exercise, lifting, going from sitting to standing, with a full bladder, with movement to the bathroom, with urgency, and without sensation Leaks 8-10 time(s) per days with urgency, more bothersome and unable to stop  leakage once it starts Leaks  2-3x/day with stress Uses 2-6 pads/night Pad use: 12 pads per day.  Changes 3-4 night gowns/day  Patient is bothered by UI symptoms.  Day time voids 8.  Nocturia: 3 times per night to void.  Denies snoring or sleep apnea Drinks fluids throughout the night time Reports minimal lower extremity edema. Voiding dysfunction:  empties bladder well.  Patient does not use a catheter to empty bladder.  When urinating, patient feels she has no difficulties Drinks: 16oz water  per day, 48oz tea/day, 20oz diet coke  UTIs: 0 UTI's in the last year.   Denies history of blood in urine, kidney or bladder stones, pyelonephritis, bladder cancer, and kidney cancer No results found for the last 90 days.   Pelvic Organ Prolapse Symptoms:                  Patient Denies a feeling of a bulge the vaginal area.  Bowel Symptom: Bowel movements: 1 time(s) per day Stool consistency: soft  Straining: no.  Splinting: no.  Incomplete evacuation: no.  Patient Admits to accidental bowel leakage / fecal incontinence  Occurs: 2-3 time(s) per year, started 20 years ago  Consistency with leakage: soft  Bowel regimen: none Last colonoscopy: Date 2022 per pt, Results not available for review [facility] Colonoscopy   This patient has no relevant Health Maintenance data.     Sexual Function Sexually active: yes.  Sexual orientation: Straight Pain with sex: No  Pelvic Pain Denies pelvic pain   Past Medical History:  Past Medical History:  Diagnosis Date   Aspiration pneumonitis (HCC)    in hosp for SBO 08/2023   Asymptomatic cholelithiasis    Atrial fibrillation (HCC)    persistent (outpt monitoring 08/2022)   Bilateral lower extremity edema    Carpal tunnel syndrome    bilateral, had surgery on right hand   Chronic renal insufficiency, stage 3 (moderate)    GFR 50s   Diabetes mellitus without complication (HCC)    Dr. Faythe   Diverticulosis    +hx of 'itis   Endometrial cancer [facility])    RT spring 2024    GERD (gastroesophageal reflux disease)    Gout    No problems after getting on allopurinol    Heart murmur    soft, systolic   History of blood transfusion    History of colon polyps    History of iron deficiency    Hyperlipidemia    Hypertension    Osteoarthritis, multiple sites    esp knees   SBO (small bowel obstruction) (HCC)    adhesions (admission 07/2023)   Urinary incontinence      Past Surgical History:   Past Surgical History:  Procedure Laterality Date   CARPAL TUNNEL RELEASE Bilateral    R 1991.  L 2024   Cataract  04/01/2024   Right   COLON SURGERY     COLONOSCOPY     x2.  Last was 2018   COLOSTOMY     COLOSTOMY REVERSAL     DILATATION & CURETTAGE/HYSTEROSCOPY WITH MYOSURE N/A 07/17/2019   Procedure: DILATATION & CURETTAGE/HYSTEROSCOPY WITH MYOSURE;  Surgeon: Marget Lenis, MD;  Location: [facility];  Service: Gynecology;  Laterality: N/A;   OOPHORECTOMY  2000   unilateral   PILONIDAL CYST EXCISION     ROBOTIC ASSISTED TOTAL HYSTERECTOMY WITH BILATERAL SALPINGO OOPHERECTOMY N/A 08/20/2019   Procedure: DIAGNSOTIC LAPAROSCOPY, DILATION AND CURETTAGE, IUD PLACEMENT ;  Surgeon: Eloy Herring, MD;  Location: [facility];  Service: Gynecology;  Laterality: N/A;   SKIN CANCER EXCISION     TRANSTHORACIC ECHOCARDIOGRAM     08/2022 EF 50-55%, inc PA pressure, nl RV fxn, valves ok     Past OB/GYN History: OB History  Gravida Para Term Preterm AB Living  2 2 2   2   SAB IAB Ectopic Multiple Live Births      2    # Outcome Date GA Lbr Len/2nd Weight Sex Type Anes PTL Lv  2 Term     F Vag-Spont   LIV  1 Term     F Vag-Forceps   LIV    Vaginal deliveries: largest infant 8lb11oz, episiotomy repaired without complications. Forceps/ Vacuum deliveries: 1 forceps, Cesarean section: 0 Menopausal: Yes, at age 34, Admits to vaginal bleeding since menopause Contraception: s/p menopause. Last pap smear was 09/01/23 per pt.  Any history of abnormal pap smears: no. No  results found for: DIAGPAP, HPVHIGH, ADEQPAP  Medications: Patient has a current medication list which includes the following prescription(s): acetaminophen , allopurinol , amlodipine , apixaban , atorvastatin , carvedilol , cholecalciferol, coq10, dexcom g7 receiver, dexcom g7 sensor, embecta insulin  syr ultrafine, famotidine, furosemide , glucosamine-chondroitin, insulin  nph-regular human, krill oil, meclizine , melatonin, multivitamin with minerals, potassium chloride , gemtesa , and gemtesa .   Allergies: Patient is allergic to lisinopril, actos [pioglitazone], metformin, and metformin and related.   Social History:  Social History   Tobacco Use   Smoking status: Former    Current packs/day: 0.00    Average packs/day: 3.0 packs/day for 22.0 years (66.0 ttl pk-yrs)    Types: Cigarettes    Start date: 06/07/1965    Quit date: 06/08/1987    Years since quitting: 37.5   Smokeless tobacco: Never  Vaping Use   Vaping status: Never Used  Substance Use Topics   Alcohol use: Not Currently   Drug use: No    Relationship status: married Patient lives with her husband, daughter, and son-in-law.   Patient is not employed. Regular exercise: No History of abuse: No  Family History:   Family History  Problem Relation Age of Onset   Hypertension Mother    Skin cancer Father    Diabetes Sister    Uterine cancer Sister    Hypertension Sister    Breast cancer Sister    Diabetes Sister    Hearing loss Sister    Hypertension Sister    Kidney disease Sister    Cancer Sister    Multiple myeloma Sister    Diabetes Brother    Diabetes Maternal Grandfather    Stroke Maternal Grandfather    Diabetes Paternal Grandmother    Hearing loss Daughter      Review of Systems: Review of Systems  Constitutional:  Negative for fever, malaise/fatigue and weight loss.  Respiratory:  Positive for shortness of breath. Negative for cough and wheezing.   Cardiovascular:  Positive for leg swelling. Negative  for chest pain and palpitations.  Gastrointestinal:  Negative for abdominal pain, blood in stool and constipation.  Genitourinary:  Positive for frequency and urgency. Negative for dysuria and hematuria.       Leakage  Skin:  Negative for rash.  Neurological:  Positive for dizziness. Negative for weakness and headaches.  Endo/Heme/Allergies:  Bruises/bleeds easily.  Psychiatric/Behavioral:  Negative for depression. The patient is not nervous/anxious.      OBJECTIVE Physical Exam: Vitals:   11/30/24 1304  BP: (!) 148/79  Pulse: 91  Weight: (!) 328 lb (148.8 kg)  Height: 5' 4 (1.626 m)  Physical Exam Constitutional:      General: She is not in acute distress.    Appearance: Normal appearance.  Genitourinary:     Bladder and urethral meatus normal.     No lesions in the vagina.     Genitourinary Comments: Limited visualization due to habitus, cervix flushed with vaginal wall with possible vaginal adhesions noted     Right Labia: No rash, tenderness, lesions, skin changes or Bartholin's cyst.    Left Labia: No tenderness, lesions, skin changes, Bartholin's cyst or rash.    No vaginal discharge, erythema, tenderness, bleeding, ulceration or granulation tissue.     No vaginal prolapse present.    Severe vaginal atrophy present.     Right Adnexa: not tender, not full and no mass present.    Left Adnexa: not tender, not full and no mass present.    No cervical motion tenderness, discharge, friability, lesion, polyp or nabothian cyst.     Uterus is not enlarged, fixed, tender, irregular or prolapsed.     No uterine mass detected.    Urethral meatus caruncle not present.    No urethral prolapse, tenderness, mass, hypermobility, discharge or stress urinary incontinence with cough stress test present.     Bladder is not tender, urgency on palpation not present and masses not present.      Levator ani not tender, obturator internus not tender, no asymmetrical contractions present and  no pelvic spasms present.    Symmetrical pelvic sensation, anal wink present and BC reflex present. Cardiovascular:     Rate and Rhythm: Normal rate.  Pulmonary:     Effort: Pulmonary effort is normal. No respiratory distress.  Abdominal:     General: There is no distension.     Palpations: Abdomen is soft. There is no mass.     Tenderness: There is no abdominal tenderness.     Hernia: A hernia is present.   Neurological:     Mental Status: She is alert.  Vitals reviewed. Exam conducted with a chaperone present.      POP-Q:   POP-Q  -3                                            Aa   -3                                           Ba  -4                                              C   2                                            Gh  4                                            Pb  4  tvl   -3                                            Ap  -3                                            Bp  -4                                              D   Post-Void Residual (PVR) by Bladder Scan: In order to evaluate bladder emptying, we discussed obtaining a postvoid residual and patient agreed to this procedure.  Procedure: The ultrasound unit was placed on the patient's abdomen in the suprapubic region after the patient had voided.    Post Void Residual - 11/30/24 1340       Post Void Residual   Post Void Residual 108 mL         Straight Catheterization Procedure for PVR: After verbal consent was obtained from the patient for catheterization to assess bladder emptying and residual volume the urethra and surrounding tissues were prepped with betadine and an in and out catheterization was performed.  PVR was 40mL.  Urine appeared clear yellow. The patient tolerated the procedure well.   Laboratory Results: Lab Results  Component Value Date   COLORU yellow 11/30/2024   CLARITYU clear 11/30/2024   GLUCOSEUR negative 11/30/2024    BILIRUBINUR negative 11/30/2024   SPECGRAV 1.020 11/30/2024   RBCUR negative 11/30/2024   PHUR 6.5 11/30/2024   PROTEINUR NEGATIVE 08/17/2019   UROBILINOGEN 0.2 11/30/2024   LEUKOCYTESUR Negative 11/30/2024    Lab Results  Component Value Date   CREATININE 1.02 10/20/2024   CREATININE 1.29 (H) 10/12/2024   CREATININE 1.46 (H) 10/01/2024    Lab Results  Component Value Date   HGBA1C 5.9 (H) 08/01/2023    Lab Results  Component Value Date   HGB 12.8 11/06/2024     ASSESSMENT AND PLAN Ms. Hegg is a 74 y.o. with:  1. Urinary incontinence, mixed   2. Morbid (severe) obesity due to excess calories (HCC)   3. Nocturia     Urinary incontinence, mixed Assessment & Plan: - POCT UA negative, PVR 40mL - tried oxybutynin  XL 10mg , gemtesa  in the daytime in 2024 without relief  - urgency > stress - We discussed the symptoms of overactive bladder (OAB), which include urinary urgency, urinary frequency, nocturia, with or without urge incontinence.  While we do not know the exact etiology of OAB, several treatment options exist. We discussed management including behavioral therapy (decreasing bladder irritants, urge suppression strategies, timed voids, bladder retraining), physical therapy, medication; for refractory cases posterior tibial nerve stimulation, sacral neuromodulation, and intravesical botulinum toxin injection.  For anticholinergic medications, we discussed the potential side effects of anticholinergics including dry eyes, dry mouth, constipation, cognitive impairment and urinary retention. For Beta-3 agonist medication, we discussed the potential side effect of elevated blood pressure which is more likely to occur in individuals with uncontrolled hypertension. - samples and Rx to resume Gemtesa  - can add Trospium for dual therapy - encouraged fluid management and caffeine reduction - switch lasix   to 2pm For refractory OAB we reviewed the procedure for intravesical Botox  injection with cystoscopy in the office and reviewed the risks, benefits and alternatives of treatment including but not limited to infection, need for self-catheterization and need for repeat therapy.  We discussed that there is a 5-15% chance of needing to catheterize with Botox and that this usually resolves in a few months; however can persist for longer periods of time.  Typically Botox injections would need to be repeated every 3-12 months since this is not a permanent therapy.   We discussed the role of sacral neuromodulation and how it works. It requires a test phase, and documentation of bladder function via diary. After a successful test period, a permanent wire and generator are placed in the OR. The battery lasts 5 years on average and would need to be replaced surgically.  The goal of this therapy is at least a 50% improvement in symptoms. It is NOT realistic to expect a 100% cure.  We reviewed the fact that about 30% of patients fail the test phase and are not candidates for permanent generator placement.  We discussed the risk of infection and that the patient would have to switch to MRI compatible mode during imaging once the device is placed. There are two companies that provide this therapy: [employer] and [employer] that both offer rechargeable or non-rechargeable options. For all procedures, we discussed risks of bleeding, infection, damage to surrounding organs including bowel, bladder, blood vessels, ureters and nerves, need for further surgery, risk of postoperative urinary incontinence or retention with need to catheterize, recurrent prolapse, numbness and weakness at any body site, buttock pain, and the rarer risks of blood clot, heart attack, pneumonia, death.    We also discussed the role of percutaneous tibial nerve stimulation and how it works.  She understands it requires 12 weekly visits for temporary neuromodulation of the sacral nerve roots via the tibial nerve and that she may then  require continued tapered treatment.  - declines botox injections, pt has concerns with [facility] teaching due to habitus - pt considering PTNS, maybe challenging with LE edema - For treatment of stress urinary incontinence,  non-surgical options include expectant management, weight loss, physical therapy, as well as a pessary.  Surgical options include a midurethral sling, Burch urethropexy, and transurethral injection of a bulking agent. - consider pelvic floor PT - encouraged weight reduction, offered referral to healthy weight and wellness. Patient desires to resume dietary modifications due to success in the past. - discussed proper vulvar care, warm compression, avoid pad use if possible at night, cotton only underwear and barrier ointment if needed  - encouraged Vit E suppository, moisturizer with Replens/Revaree and avoid body lotion for vulvar use  Orders: -     POCT URINALYSIS DIP (CLINITEK) -     Gemtesa ; Take 1 tablet (75 mg total) by mouth daily.  Dispense: 14 tablet -     Gemtesa ; Take 1 tablet (75 mg total) by mouth daily.  Dispense: 30 tablet; Refill: 2  Morbid (severe) obesity due to excess calories (HCC) Assessment & Plan: - encouraged weight reduction due to association with pelvic floor disorders - offered referral to healthy weight and wellness, reports prior assessment with success after dietary modification and avoiding sweets. Reports talking to her husband and desires to start diet modification first.   Orders: -     Ambulatory referral to Sleep Studies  Nocturia Assessment & Plan: - avoid fluid intake 3 hours before bedtime -  elevated feet during the day or use compression socks to reduce lower extremity swelling - switch your diuretic (e.g. furosemide ) dosing to 2pm - due to snoring, referred for sleep study to rule out sleep apnea - samples to resume gemtesa  at bedtime with Rx provided - consider dual therapy  - encouraged to resume Purewick  - shares motorized  wheelchair with her husband and uses walker at night, consider bedside commode due to possible functional incontinence and avoid falls - encouraged pad free nights if possible   Orders: -     Ambulatory referral to Sleep Studies  Time spent: I spent 69 minutes dedicated to the care of this patient on the date of this encounter to include pre-visit review of records, face-to-face time with the patient discussing mixed urinary incontinence, BMI, nocturia, and post visit documentation and ordering medication/ testing.    Lianne ONEIDA Gillis, MD

## 2024-12-01 ENCOUNTER — Other Ambulatory Visit (HOSPITAL_BASED_OUTPATIENT_CLINIC_OR_DEPARTMENT_OTHER): Payer: Self-pay

## 2024-12-02 ENCOUNTER — Other Ambulatory Visit (HOSPITAL_BASED_OUTPATIENT_CLINIC_OR_DEPARTMENT_OTHER): Payer: Self-pay

## 2024-12-02 LAB — GENECONNECT MOLECULAR SCREEN: Genetic Analysis Overall Interpretation: NEGATIVE

## 2024-12-05 ENCOUNTER — Other Ambulatory Visit (HOSPITAL_BASED_OUTPATIENT_CLINIC_OR_DEPARTMENT_OTHER): Payer: Self-pay

## 2024-12-08 ENCOUNTER — Other Ambulatory Visit: Payer: Self-pay

## 2024-12-08 ENCOUNTER — Encounter: Payer: Self-pay | Admitting: Obstetrics

## 2024-12-08 DIAGNOSIS — I10 Essential (primary) hypertension: Secondary | ICD-10-CM

## 2024-12-10 ENCOUNTER — Ambulatory Visit: Admitting: Podiatry

## 2024-12-10 ENCOUNTER — Other Ambulatory Visit: Payer: Self-pay

## 2024-12-10 ENCOUNTER — Encounter: Payer: Self-pay | Admitting: Podiatry

## 2024-12-10 ENCOUNTER — Other Ambulatory Visit (HOSPITAL_BASED_OUTPATIENT_CLINIC_OR_DEPARTMENT_OTHER): Payer: Self-pay

## 2024-12-10 DIAGNOSIS — B351 Tinea unguium: Secondary | ICD-10-CM | POA: Diagnosis not present

## 2024-12-10 DIAGNOSIS — M79675 Pain in left toe(s): Secondary | ICD-10-CM | POA: Diagnosis not present

## 2024-12-10 DIAGNOSIS — M79674 Pain in right toe(s): Secondary | ICD-10-CM | POA: Diagnosis not present

## 2024-12-10 DIAGNOSIS — E118 Type 2 diabetes mellitus with unspecified complications: Secondary | ICD-10-CM | POA: Diagnosis not present

## 2024-12-10 MED ORDER — CARVEDILOL 12.5 MG PO TABS: 12.5000 mg | ORAL_TABLET | Freq: Two times a day (BID) | ORAL | 0 refills | Status: AC

## 2024-12-10 MED ORDER — ALLOPURINOL 300 MG PO TABS: 300.0000 mg | ORAL_TABLET | Freq: Every day | ORAL | 1 refills | Status: AC

## 2024-12-10 NOTE — Progress Notes (Signed)
 This patient returns to my office for at risk foot care.  This patient requires this care by a professional since this patient will be at risk due to having diabetes.  Her right lower leg is draining. This patient is unable to cut nails herself since the patient cannot reach her nails.These nails are painful walking and wearing shoes.  This patient presents for at risk foot care today.  General Appearance  Alert, conversant and in no acute stress.  Vascular  Dorsalis pedis and posterior tibial  pulses are palpable  bilaterally.  Capillary return is within normal limits  bilaterally. Temperature is within normal limits  bilaterally.  Neurologic  Senn-Weinstein monofilament wire test within normal limits  bilaterally. Muscle power within normal limits bilaterally.  Nails Thick disfigured discolored nails with subungual debris  from hallux to fifth toes bilaterally. No evidence of bacterial infection or drainage bilaterally.  Orthopedic  No limitations of motion  feet .  No crepitus or effusions noted.  No bony pathology or digital deformities noted.  Skin  normotropic skin with no porokeratosis noted bilaterally.  No signs of infections or ulcers noted.     Onychomycosis  Pain in right toes  Pain in left toes  Consent was obtained for treatment procedures.   Mechanical debridement of nails 1-5  bilaterally performed with a nail nipper.  Filed with dremel without incident.    Return office visit      12     weeks              Told patient to return for periodic foot care and evaluation due to potential at risk complications.   Helane Gunther DPM

## 2024-12-11 ENCOUNTER — Other Ambulatory Visit (HOSPITAL_BASED_OUTPATIENT_CLINIC_OR_DEPARTMENT_OTHER): Payer: Self-pay

## 2024-12-14 ENCOUNTER — Other Ambulatory Visit: Payer: Self-pay

## 2024-12-14 ENCOUNTER — Other Ambulatory Visit (HOSPITAL_BASED_OUTPATIENT_CLINIC_OR_DEPARTMENT_OTHER): Payer: Self-pay

## 2024-12-14 MED ORDER — FUROSEMIDE 40 MG PO TABS: ORAL_TABLET | ORAL | 0 refills | Status: DC

## 2024-12-15 ENCOUNTER — Other Ambulatory Visit (HOSPITAL_BASED_OUTPATIENT_CLINIC_OR_DEPARTMENT_OTHER): Payer: Self-pay

## 2024-12-21 ENCOUNTER — Other Ambulatory Visit (HOSPITAL_BASED_OUTPATIENT_CLINIC_OR_DEPARTMENT_OTHER): Payer: Self-pay

## 2024-12-23 ENCOUNTER — Other Ambulatory Visit (HOSPITAL_BASED_OUTPATIENT_CLINIC_OR_DEPARTMENT_OTHER): Payer: Self-pay

## 2024-12-25 ENCOUNTER — Other Ambulatory Visit (HOSPITAL_BASED_OUTPATIENT_CLINIC_OR_DEPARTMENT_OTHER): Payer: Self-pay

## 2024-12-30 ENCOUNTER — Other Ambulatory Visit (HOSPITAL_COMMUNITY): Payer: Self-pay

## 2024-12-30 ENCOUNTER — Other Ambulatory Visit (HOSPITAL_BASED_OUTPATIENT_CLINIC_OR_DEPARTMENT_OTHER): Payer: Self-pay

## 2024-12-30 ENCOUNTER — Telehealth: Payer: Self-pay | Admitting: Pharmacy Technician

## 2024-12-30 NOTE — Telephone Encounter (Signed)
 Pharmacy Patient Advocate Encounter   Received notification from Patient Advice Request messages that prior authorization for Gemtesa  75MG  tablets is required/requested.   Insurance verification completed.   The patient is insured through [facility] MEDIC[facility].   Per test claim: PA required; PA started via CoverMyMeds. KEY [id number] . Please see clinical question(s) below that I am not finding the answer to in their chart and advise.  Please advise. I see she has only had an inadequate response to oxybutynin .

## 2024-12-30 NOTE — Telephone Encounter (Signed)
 PA request has been Started. New Encounter has been or will be created for follow up. For additional info see Pharmacy telephone encounter from 12/30/2024.

## 2025-01-04 ENCOUNTER — Other Ambulatory Visit: Payer: Self-pay | Admitting: Obstetrics

## 2025-01-04 DIAGNOSIS — N3946 Mixed incontinence: Secondary | ICD-10-CM

## 2025-01-04 DIAGNOSIS — R351 Nocturia: Secondary | ICD-10-CM

## 2025-01-04 LAB — HEMOGLOBIN A1C: Hemoglobin A1C: 6.6

## 2025-01-04 LAB — VITAMIN B12: Vitamin B-12: 277

## 2025-01-04 LAB — BASIC METABOLIC PANEL WITH GFR: Potassium: 4 meq/L (ref 3.5–5.1)

## 2025-01-04 NOTE — Progress Notes (Unsigned)
 Avoiding anticholinergics due to potassium supplementation.  Pending

## 2025-01-05 ENCOUNTER — Other Ambulatory Visit (HOSPITAL_BASED_OUTPATIENT_CLINIC_OR_DEPARTMENT_OTHER): Payer: Self-pay

## 2025-01-05 ENCOUNTER — Other Ambulatory Visit (HOSPITAL_COMMUNITY): Payer: Self-pay

## 2025-01-05 NOTE — Telephone Encounter (Signed)
 Pharmacy Patient Advocate Encounter  Received notification from [facility] MEDICARE that Prior Authorization for Gemtesa  75MG  tablets has been CANCELLED.    PA #/Case ID/Reference #: [id number]  Test claim shows medication was filled on 01/05/2025 and refill is due on of after 01/27/2025.   No further PA needed at this time.

## 2025-01-08 ENCOUNTER — Ambulatory Visit: Admitting: Family Medicine

## 2025-01-08 ENCOUNTER — Other Ambulatory Visit (HOSPITAL_BASED_OUTPATIENT_CLINIC_OR_DEPARTMENT_OTHER): Payer: Self-pay

## 2025-01-08 ENCOUNTER — Other Ambulatory Visit: Payer: Self-pay

## 2025-01-08 VITALS — BP 109/63 | HR 80 | Temp 97.3°F | Ht 64.0 in | Wt 324.6 lb

## 2025-01-08 DIAGNOSIS — E7800 Pure hypercholesterolemia, unspecified: Secondary | ICD-10-CM | POA: Diagnosis not present

## 2025-01-08 DIAGNOSIS — N2889 Other specified disorders of kidney and ureter: Secondary | ICD-10-CM | POA: Diagnosis not present

## 2025-01-08 DIAGNOSIS — I5032 Chronic diastolic (congestive) heart failure: Secondary | ICD-10-CM

## 2025-01-08 DIAGNOSIS — Z7984 Long term (current) use of oral hypoglycemic drugs: Secondary | ICD-10-CM | POA: Diagnosis not present

## 2025-01-08 DIAGNOSIS — R600 Localized edema: Secondary | ICD-10-CM

## 2025-01-08 DIAGNOSIS — L304 Erythema intertrigo: Secondary | ICD-10-CM | POA: Diagnosis not present

## 2025-01-08 DIAGNOSIS — I10 Essential (primary) hypertension: Secondary | ICD-10-CM

## 2025-01-08 DIAGNOSIS — E119 Type 2 diabetes mellitus without complications: Secondary | ICD-10-CM | POA: Diagnosis not present

## 2025-01-08 LAB — BASIC METABOLIC PANEL WITH GFR
BUN: 23 mg/dL (ref 6–23)
CO2: 35 meq/L — ABNORMAL HIGH (ref 19–32)
Calcium: 9.4 mg/dL (ref 8.4–10.5)
Chloride: 101 meq/L (ref 96–112)
Creatinine, Ser: 1.27 mg/dL — ABNORMAL HIGH (ref 0.40–1.20)
GFR: 41.11 mL/min — ABNORMAL LOW
Glucose, Bld: 120 mg/dL — ABNORMAL HIGH (ref 70–99)
Potassium: 4.4 meq/L (ref 3.5–5.1)
Sodium: 140 meq/L (ref 135–145)

## 2025-01-08 MED ORDER — NYSTATIN 100000 UNIT/GM EX CREA
1.0000 | TOPICAL_CREAM | Freq: Two times a day (BID) | CUTANEOUS | 1 refills | Status: AC
  Filled 2025-01-08: qty 60, 90d supply, fill #0

## 2025-01-08 MED ORDER — ATORVASTATIN CALCIUM 80 MG PO TABS
80.0000 mg | ORAL_TABLET | Freq: Every day | ORAL | 3 refills | Status: AC
  Filled 2025-01-08 – 2025-01-30 (×3): qty 90, 90d supply, fill #0

## 2025-01-08 MED ORDER — FUROSEMIDE 40 MG PO TABS
80.0000 mg | ORAL_TABLET | Freq: Every morning | ORAL | 3 refills | Status: AC
  Filled 2025-01-08 – 2025-01-30 (×3): qty 180, 90d supply, fill #0

## 2025-01-08 MED ORDER — AMLODIPINE BESYLATE 5 MG PO TABS
5.0000 mg | ORAL_TABLET | Freq: Every day | ORAL | 3 refills | Status: AC
  Filled 2025-01-08 – 2025-01-30 (×3): qty 90, 90d supply, fill #0

## 2025-01-08 MED ORDER — POTASSIUM CHLORIDE ER 10 MEQ PO TBCR
10.0000 meq | EXTENDED_RELEASE_TABLET | Freq: Two times a day (BID) | ORAL | 3 refills | Status: AC
  Filled 2025-01-08: qty 180, 90d supply, fill #0

## 2025-01-08 NOTE — Progress Notes (Signed)
 OFFICE VISIT  01/08/2025  CC:  Chief Complaint  Patient presents with   Medical Management of Chronic Issues    Pt is not fasting    Patient is a 75 y.o. female who presents for 60-month follow-up chronic bilateral lower extremity edema, hypertension, chronic diastolic heart failure, and chronic renal insufficiency. A/P as of last visit: 1) Chronic diastolic HF, asymptomatic but activity very limited by her osteoarthritis. Stable.  Continue losartan  50 mg a day, Coreg  12.5 mg twice a day, and Lasix  80 mg daily. Electrolytes and creatinine today.   2) CRI III. Avoid NSAIDs. Lytes/cr today.   3) HTN, stable on amlod 5mg  every day, coreg  12.5 bid, and losartan  50 every day. Lytes/cr today.   #4 hypercholesterolemia. Stable on a atorvastatin  80 mg a day. LDL was 61 about 6 months ago.  She is not fasting today. Plan repeat lipids in 3 months.   #5 chronic bilateral lower extremity edema/lymphedema. Stable.  No stasis ulcers. She will continue sodium restriction. Continue Lasix  80 mg daily.   6.  DM per endo Dr. Faythe. Good control.  She says her last hemoglobin A1c with Dr. Faythe was 6.1%. She is unable to give a urine sample for microalbumin/creatinine testing due to complete urinary incontinence.   #7 bilateral heel pain. States that she has had this for about the last year. She notes that after she rests overnight and first gets up in the morning her feet do not hurt.  As her day goes on they hurt worse and worse. Question plantar fat pad syndrome. Lower suspicion of plantar fasciitis.  Her pain is not consistent with peripheral neuropathy.  INTERIM HX: Feeling well other than rash on the right breast for the last several weeks, progressing.  She is applying over-the-counter athlete's foot cream without effect.  She followed up with her endocrinologist about 4 days ago, hemoglobin A1c was 6.6%.  She was diagnosed with peripheral neuropathy.  She was started on Farxiga 5 mg  a day.  She ran out of potassium pills a few weeks ago.  ROS as above, plus--> no fevers, no CP, no SOB, no wheezing, no cough, no dizziness, no HAs, no rashes, no melena/hematochezia.  No polyuria or polydipsia.  No myalgias or arthralgias.  No focal weakness, paresthesias, or tremors.  No acute vision or hearing abnormalities.  No dysuria or unusual/new urinary urgency or frequency.  No recent changes in lower legs. No n/v/d or abd pain.  No palpitations.    Past Medical History:  Diagnosis Date   Allergy 01/01/2004   Anemia 09/30/2022   Aspiration pneumonitis (HCC)    in hosp for SBO 08/2023   Asymptomatic cholelithiasis    Atrial fibrillation (HCC)    persistent (outpt monitoring 08/2022)   Bilateral lower extremity edema    Cancer (HCC) 06/30/2020   Carpal tunnel syndrome    bilateral, had surgery on right hand   Cataract 11/30/2022   CHF (congestive heart failure) (HCC)    Chronic renal insufficiency, stage 3 (moderate)    GFR 50s   Diabetes mellitus without complication (HCC)    Dr. Faythe   Diverticulosis    +hx of 'itis   Endometrial cancer [facility])    RT spring 2024   GERD (gastroesophageal reflux disease)    Gout    No problems after getting on allopurinol    Heart murmur    soft, systolic   History of blood transfusion    History of colon polyps  History of iron deficiency    Hyperlipidemia    Hypertension    Osteoarthritis, multiple sites    esp knees   SBO (small bowel obstruction) (HCC)    adhesions (admission 07/2023)   Urinary incontinence     Past Surgical History:  Procedure Laterality Date   CARPAL TUNNEL RELEASE Bilateral    R 1991.  L 2024   Cataract  04/01/2024   Right   COLON SURGERY     COLONOSCOPY     x2.  Last was 2018   COLOSTOMY     COLOSTOMY REVERSAL     DILATATION & CURETTAGE/HYSTEROSCOPY WITH MYOSURE N/A 07/17/2019   Procedure: DILATATION & CURETTAGE/HYSTEROSCOPY WITH MYOSURE;  Surgeon: Marget Lenis, MD;  Location: [facility];  Service: Gynecology;  Laterality: N/A;   EYE SURGERY  05/31/2024   catact right eye   OOPHORECTOMY  2000   unilateral   PILONIDAL CYST EXCISION     ROBOTIC ASSISTED TOTAL HYSTERECTOMY WITH BILATERAL SALPINGO OOPHERECTOMY N/A 08/20/2019   Procedure: DIAGNSOTIC LAPAROSCOPY, DILATION AND CURETTAGE, IUD PLACEMENT ;  Surgeon: Eloy Herring, MD;  Location: [facility];  Service: Gynecology;  Laterality: N/A;   SKIN CANCER EXCISION     TRANSTHORACIC ECHOCARDIOGRAM     08/2022 EF 50-55%, inc PA pressure, nl RV fxn, valves ok    Outpatient Medications Prior to Visit  Medication Sig Dispense Refill   acetaminophen  (TYLENOL ) 500 MG tablet Take 1,000 mg by mouth every 6 (six) hours as needed for moderate pain.     allopurinol  (ZYLOPRIM ) 300 MG tablet Take 1 tablet (300 mg total) by mouth daily. 90 tablet 1   amLODipine  (NORVASC ) 5 MG tablet TAKE 1 TABLET DAILY 90 tablet 0   apixaban  (ELIQUIS ) 5 MG TABS tablet Take 1 tablet (5 mg total) by mouth 2 (two) times daily. 180 tablet 1   atorvastatin  (LIPITOR ) 80 MG tablet TAKE 1 TABLET AT BEDTIME 90 tablet 0   carvedilol  (COREG ) 12.5 MG tablet Take 1 tablet (12.5 mg total) by mouth 2 (two) times daily with a meal. 30 tablet 0   Cholecalciferol (VITAMIN D3 PO) Take 1 capsule by mouth daily.     Coenzyme Q10 (COQ10) 100 MG CAPS Take 100 mg by mouth daily.     Continuous Glucose Receiver (DEXCOM G7 RECEIVER) DEVI . . change sensor every 10 days on insulin  for 90 days     Continuous Glucose Sensor (DEXCOM G7 SENSOR) MISC . SABRA change sensor every 10 days on insulin  for 90 days     dapagliflozin propanediol (FARXIGA) 5 MG TABS tablet Take 5 mg by mouth daily.     EMBECTA INSULIN  SYR ULTRAFINE 31G X 15/64 1 ML MISC Inject 1 Syringe into the skin 2 (two) times daily.     famotidine (PEPCID) 20 MG tablet Take 20 mg by mouth daily.      furosemide  (LASIX ) 40 MG tablet Take 2 tablet in the morning. 180 tablet 0   GLUCOSAMINE-CHONDROITIN PO Take 1 tablet by mouth 2  (two) times daily.     insulin  NPH-regular Human (70-30) 100 UNIT/ML injection Inject 50-70 Units into the skin See admin instructions. 70 units In the morning and 50 units in the evening     KRILL OIL PO Take 1 capsule by mouth daily.     meclizine  (ANTIVERT ) 12.5 MG tablet Take 1 tablet (12.5 mg total) by mouth 3 (three) times daily as needed for dizziness. 30 tablet 0   MELATONIN PO Take 10  mg by mouth at bedtime.     Multiple Vitamins-Minerals (MULTIVITAMIN WITH MINERALS) tablet Take 1 tablet by mouth daily.     potassium chloride  (KLOR-CON ) 10 MEQ tablet Take 1 tablet (10 mEq total) by mouth 2 (two) times daily. 180 tablet 3   Vibegron  (GEMTESA ) 75 MG TABS Take 1 tablet (75 mg total) by mouth daily. 14 tablet    Vibegron  (GEMTESA ) 75 MG TABS Take 1 tablet (75 mg total) by mouth daily. 30 tablet 2   No facility-administered medications prior to visit.    Allergies[1]  Review of Systems As per HPI  PE:    01/08/2025    1:39 PM 11/30/2024    1:04 PM 11/06/2024    3:23 PM  Vitals with BMI  Height 5' 4 5' 4   Weight 324 lbs 10 oz 328 lbs 326 lbs 10 oz  BMI 55.69 56.27   Systolic 109 148 843  Diastolic 63 79 56  Pulse 80 91 79     Physical Exam  Gen: Alert, well appearing.  Patient is oriented to person, place, time, and situation. AFFECT: pleasant, lucid thought and speech. Right inframammary region with large area of moist intertrigonous rash EXT: No pitting edema today.  LABS:  Last CBC Lab Results  Component Value Date   WBC 6.5 11/06/2024   HGB 12.8 11/06/2024   HCT 38.3 11/06/2024   MCV 96.0 11/06/2024   MCH 32.1 11/06/2024   RDW 14.2 11/06/2024   PLT 186 11/06/2024   Last metabolic panel Lab Results  Component Value Date   GLUCOSE 167 (H) 10/20/2024   NA 140 10/20/2024   K 3.9 10/20/2024   CL 103 10/20/2024   CO2 29 10/20/2024   BUN 24 (H) 10/20/2024   CREATININE 1.02 10/20/2024   GFR 53.56 (L) 10/20/2024   CALCIUM  9.2 10/20/2024   PROT 6.5  06/17/2024   ALBUMIN 4.1 06/17/2024   BILITOT 0.7 06/17/2024   ALKPHOS 123 (H) 06/17/2024   AST 23 06/17/2024   ALT 12 06/17/2024   ANIONGAP 12 08/06/2023   Last lipids Lab Results  Component Value Date   CHOL 148 03/16/2024   HDL 37.90 (L) 03/16/2024   LDLCALC 61 03/16/2024   TRIG 244.0 (H) 03/16/2024   CHOLHDL 4 03/16/2024   Last hemoglobin A1c Lab Results  Component Value Date   HGBA1C 5.9 (H) 08/01/2023   Last thyroid  functions Lab Results  Component Value Date   TSH 6.020 (H) 09/10/2022   FREET4 1.00 09/10/2022   Lab Results  Component Value Date   VITAMINB12 405 11/06/2024   IMPRESSION AND PLAN:  1) Chronic diastolic HF, asymptomatic but activity very limited by her osteoarthritis. Stable.  Continue Coreg  12.5 mg twice a day and Lasix  80 mg daily. Electrolytes and creatinine today.   2) CRI III. Avoid NSAIDs. Lytes/cr today.   3) HTN, stable on amlod 5mg  every day and coreg  12.5 bid. Lytes/cr today.   #4 hypercholesterolemia. Stable on a atorvastatin  80 mg a day. LDL was 61 about 10 months ago.  She is not fasting today. Plan repeat lipids in 3 months.   #5 chronic bilateral lower extremity edema/lymphedema. Stable.  No stasis ulcers. She will continue sodium restriction. Continue Lasix  80 mg daily.   #6 right inframammary intertrigo. Nystatin  cream prescribed, apply twice daily. Apply generous amount of Boudreaux's buttpaste on top.  7.  DM per endo Dr. Faythe. Good control.  She says her last hemoglobin A1c with Dr. Faythe was 6.6%. Medication  list updated-->Dr. Faythe recently started her on Farxiga 5 mg a day.  An After Visit Summary was printed and given to the patient.  FOLLOW UP: No follow-ups on file.  Signed:  Gerlene Hockey, MD           01/08/2025      [1]  Allergies Allergen Reactions   Lisinopril Cough    Other reaction(s): cough, Not available   Actos [Pioglitazone]     Unknown reaction   Metformin Other (See Comments)    Metformin And Related     Gas

## 2025-01-09 ENCOUNTER — Encounter: Payer: Self-pay | Admitting: Family Medicine

## 2025-01-10 ENCOUNTER — Ambulatory Visit: Payer: Self-pay | Admitting: Family Medicine

## 2025-01-11 ENCOUNTER — Other Ambulatory Visit (HOSPITAL_BASED_OUTPATIENT_CLINIC_OR_DEPARTMENT_OTHER): Payer: Self-pay

## 2025-01-11 MED ORDER — ESZOPICLONE 3 MG PO TABS
3.0000 mg | ORAL_TABLET | Freq: Every day | ORAL | 1 refills | Status: AC
  Filled 2025-01-11 (×2): qty 90, 90d supply, fill #0
  Filled 2025-02-02: qty 90, 90d supply, fill #1

## 2025-01-11 NOTE — Telephone Encounter (Signed)
 Rx sent

## 2025-01-13 ENCOUNTER — Other Ambulatory Visit (HOSPITAL_BASED_OUTPATIENT_CLINIC_OR_DEPARTMENT_OTHER): Payer: Self-pay

## 2025-01-13 ENCOUNTER — Other Ambulatory Visit: Payer: Self-pay

## 2025-01-13 ENCOUNTER — Institutional Professional Consult (permissible substitution): Admitting: Neurology

## 2025-01-18 ENCOUNTER — Encounter: Payer: Self-pay | Admitting: Family Medicine

## 2025-01-18 NOTE — Telephone Encounter (Signed)
 Messaged forwarded to PCP from pt's chart

## 2025-01-30 ENCOUNTER — Other Ambulatory Visit (HOSPITAL_BASED_OUTPATIENT_CLINIC_OR_DEPARTMENT_OTHER): Payer: Self-pay

## 2025-01-31 ENCOUNTER — Other Ambulatory Visit: Payer: Self-pay

## 2025-02-01 ENCOUNTER — Encounter: Payer: Self-pay | Admitting: Neurology

## 2025-02-02 ENCOUNTER — Institutional Professional Consult (permissible substitution): Admitting: Neurology

## 2025-02-02 ENCOUNTER — Other Ambulatory Visit (HOSPITAL_BASED_OUTPATIENT_CLINIC_OR_DEPARTMENT_OTHER): Payer: Self-pay

## 2025-02-17 ENCOUNTER — Ambulatory Visit (HOSPITAL_COMMUNITY)

## 2025-02-26 ENCOUNTER — Inpatient Hospital Stay: Admitting: Gynecologic Oncology

## 2025-03-01 ENCOUNTER — Ambulatory Visit: Admitting: Obstetrics

## 2025-03-11 ENCOUNTER — Ambulatory Visit: Admitting: Podiatry

## 2025-03-17 ENCOUNTER — Ambulatory Visit: Admitting: Obstetrics

## 2025-04-08 ENCOUNTER — Ambulatory Visit: Admitting: Family Medicine

## 2025-05-19 ENCOUNTER — Encounter
